# Patient Record
Sex: Female | Born: 1955
Health system: Southern US, Community
[De-identification: ages and names within clinical notes are randomized; demographics above are authoritative.]

## PROBLEM LIST (undated history)

## (undated) DIAGNOSIS — D649 Anemia, unspecified: Secondary | ICD-10-CM

## (undated) DIAGNOSIS — A419 Sepsis, unspecified organism: Secondary | ICD-10-CM

## (undated) DIAGNOSIS — K219 Gastro-esophageal reflux disease without esophagitis: Secondary | ICD-10-CM

## (undated) DIAGNOSIS — M899 Disorder of bone, unspecified: Secondary | ICD-10-CM

## (undated) DIAGNOSIS — M1990 Unspecified osteoarthritis, unspecified site: Secondary | ICD-10-CM

## (undated) DIAGNOSIS — F419 Anxiety disorder, unspecified: Secondary | ICD-10-CM

## (undated) DIAGNOSIS — I10 Essential (primary) hypertension: Secondary | ICD-10-CM

## (undated) DIAGNOSIS — C801 Malignant (primary) neoplasm, unspecified: Secondary | ICD-10-CM

## (undated) DIAGNOSIS — K432 Incisional hernia without obstruction or gangrene: Secondary | ICD-10-CM

## (undated) DIAGNOSIS — G4700 Insomnia, unspecified: Secondary | ICD-10-CM

## (undated) DIAGNOSIS — I700 Atherosclerosis of aorta: Secondary | ICD-10-CM

## (undated) DIAGNOSIS — F329 Major depressive disorder, single episode, unspecified: Secondary | ICD-10-CM

## (undated) DIAGNOSIS — F32A Depression, unspecified: Secondary | ICD-10-CM

## (undated) HISTORY — DX: Anxiety disorder, unspecified: F41.9

## (undated) HISTORY — DX: Insomnia, unspecified: G47.00

## (undated) HISTORY — PX: SPINE SURGERY: SHX786

## (undated) HISTORY — DX: Depression, unspecified: F32.A

## (undated) HISTORY — PX: APPENDECTOMY: SHX54

## (undated) HISTORY — PX: HERNIA REPAIR: SHX51

## (undated) HISTORY — DX: Disorder of bone, unspecified: M89.9

## (undated) HISTORY — PX: ABDOMINAL HYSTERECTOMY: SHX81

## (undated) HISTORY — PX: BACK SURGERY: SHX140

## (undated) HISTORY — PX: TUBAL LIGATION: SHX77

## (undated) HISTORY — PX: LUMBAR FUSION: SHX111

## (undated) HISTORY — PX: PARTIAL HYSTERECTOMY: SHX80

## (undated) HISTORY — PX: MEDIAL PARTIAL KNEE REPLACEMENT: SHX5965

## (undated) HISTORY — PX: TONSILLECTOMY: SUR1361

## (undated) HISTORY — PX: JOINT REPLACEMENT: SHX530

---
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
## 1992-11-12 HISTORY — PX: LUMBAR FUSION: SHX111

## 2004-08-17 ENCOUNTER — Ambulatory Visit: Payer: Self-pay | Admitting: Obstetrics and Gynecology

## 2004-08-22 ENCOUNTER — Ambulatory Visit: Payer: Self-pay | Admitting: Obstetrics and Gynecology

## 2005-02-21 ENCOUNTER — Ambulatory Visit: Payer: Self-pay | Admitting: Obstetrics and Gynecology

## 2005-02-24 ENCOUNTER — Emergency Department: Payer: Self-pay | Admitting: Emergency Medicine

## 2005-10-11 ENCOUNTER — Ambulatory Visit: Payer: Self-pay

## 2007-05-04 ENCOUNTER — Emergency Department: Payer: Self-pay | Admitting: Unknown Physician Specialty

## 2007-05-04 ENCOUNTER — Other Ambulatory Visit: Payer: Self-pay

## 2007-07-22 ENCOUNTER — Ambulatory Visit: Payer: Self-pay | Admitting: Obstetrics and Gynecology

## 2007-08-11 ENCOUNTER — Ambulatory Visit: Payer: Self-pay | Admitting: Obstetrics and Gynecology

## 2007-09-30 ENCOUNTER — Ambulatory Visit: Payer: Self-pay | Admitting: Obstetrics and Gynecology

## 2008-10-19 ENCOUNTER — Ambulatory Visit: Payer: Self-pay | Admitting: Obstetrics and Gynecology

## 2008-12-25 ENCOUNTER — Emergency Department: Payer: Self-pay | Admitting: Emergency Medicine

## 2009-11-12 HISTORY — PX: APPENDECTOMY: SHX54

## 2009-12-14 ENCOUNTER — Ambulatory Visit: Payer: Self-pay | Admitting: Obstetrics and Gynecology

## 2009-12-22 ENCOUNTER — Ambulatory Visit: Payer: Self-pay | Admitting: Unknown Physician Specialty

## 2010-04-10 ENCOUNTER — Emergency Department: Payer: Self-pay | Admitting: Emergency Medicine

## 2010-06-06 ENCOUNTER — Ambulatory Visit: Payer: Self-pay | Admitting: Orthopedic Surgery

## 2010-06-22 ENCOUNTER — Inpatient Hospital Stay: Payer: Self-pay | Admitting: Orthopedic Surgery

## 2010-08-21 ENCOUNTER — Inpatient Hospital Stay: Payer: Self-pay | Admitting: Surgery

## 2010-08-23 LAB — PATHOLOGY REPORT

## 2010-09-13 ENCOUNTER — Ambulatory Visit: Payer: Self-pay | Admitting: Surgery

## 2010-10-10 ENCOUNTER — Ambulatory Visit: Payer: Self-pay | Admitting: Surgery

## 2010-10-11 ENCOUNTER — Other Ambulatory Visit: Payer: Self-pay | Admitting: Surgery

## 2010-11-12 HISTORY — PX: VENTRAL HERNIA REPAIR: SHX424

## 2010-11-12 HISTORY — PX: OTHER SURGICAL HISTORY: SHX169

## 2011-01-19 ENCOUNTER — Ambulatory Visit: Payer: Self-pay | Admitting: Obstetrics and Gynecology

## 2011-01-31 ENCOUNTER — Ambulatory Visit: Payer: Self-pay | Admitting: Internal Medicine

## 2011-02-01 ENCOUNTER — Ambulatory Visit: Payer: Self-pay | Admitting: Internal Medicine

## 2011-02-14 ENCOUNTER — Ambulatory Visit: Payer: Self-pay | Admitting: Surgery

## 2011-04-02 ENCOUNTER — Ambulatory Visit: Payer: Self-pay | Admitting: Urology

## 2011-04-10 ENCOUNTER — Ambulatory Visit: Payer: Self-pay | Admitting: Urology

## 2011-04-12 LAB — PATHOLOGY REPORT

## 2011-06-07 ENCOUNTER — Ambulatory Visit: Payer: Self-pay | Admitting: Surgery

## 2011-06-11 ENCOUNTER — Inpatient Hospital Stay: Payer: Self-pay | Admitting: Surgery

## 2011-06-12 LAB — PATHOLOGY REPORT

## 2011-08-06 ENCOUNTER — Ambulatory Visit: Payer: Self-pay | Admitting: Urology

## 2012-02-04 ENCOUNTER — Ambulatory Visit: Payer: Self-pay | Admitting: Urology

## 2012-02-04 LAB — CREATININE, SERUM
Creatinine: 0.77 mg/dL (ref 0.60–1.30)
EGFR (African American): >60
EGFR (Non-African Amer.): >60

## 2012-02-06 ENCOUNTER — Ambulatory Visit: Payer: Self-pay | Admitting: Obstetrics and Gynecology

## 2012-02-12 ENCOUNTER — Ambulatory Visit: Payer: Self-pay | Admitting: Obstetrics and Gynecology

## 2012-04-20 ENCOUNTER — Emergency Department: Payer: Self-pay | Admitting: Emergency Medicine

## 2012-04-28 ENCOUNTER — Emergency Department: Payer: Self-pay | Admitting: Emergency Medicine

## 2012-08-04 DIAGNOSIS — C649 Malignant neoplasm of unspecified kidney, except renal pelvis: Secondary | ICD-10-CM | POA: Insufficient documentation

## 2012-08-26 ENCOUNTER — Ambulatory Visit: Payer: Self-pay | Admitting: Obstetrics and Gynecology

## 2012-09-05 ENCOUNTER — Ambulatory Visit: Payer: Self-pay | Admitting: Urology

## 2013-03-09 ENCOUNTER — Ambulatory Visit: Payer: Self-pay | Admitting: Obstetrics and Gynecology

## 2013-10-22 ENCOUNTER — Ambulatory Visit: Payer: Self-pay | Admitting: Internal Medicine

## 2013-12-09 ENCOUNTER — Ambulatory Visit: Payer: Self-pay | Admitting: Orthopedic Surgery

## 2014-04-01 ENCOUNTER — Ambulatory Visit: Payer: Self-pay

## 2014-05-11 ENCOUNTER — Emergency Department: Payer: Self-pay | Admitting: Emergency Medicine

## 2014-05-11 LAB — BASIC METABOLIC PANEL
Anion Gap: 1 — ABNORMAL LOW (ref 7–16)
BUN: 15 mg/dL (ref 7–18)
Calcium, Total: 8.8 mg/dL (ref 8.5–10.1)
Chloride: 87 mmol/L — ABNORMAL LOW (ref 98–107)
Co2: 33 mmol/L — ABNORMAL HIGH (ref 21–32)
Creatinine: 1.02 mg/dL (ref 0.60–1.30)
EGFR (African American): >60
EGFR (Non-African Amer.): 60 — ABNORMAL LOW
Glucose: 81 mg/dL (ref 65–99)
Osmolality: 274 (ref 275–301)
Potassium: 3.5 mmol/L (ref 3.5–5.1)
Sodium: 137 mmol/L (ref 136–145)

## 2014-05-11 LAB — CBC
HCT: 41.8 % (ref 35.0–47.0)
HGB: 13.7 g/dL (ref 12.0–16.0)
MCH: 30 pg (ref 26.0–34.0)
MCHC: 32.8 g/dL (ref 32.0–36.0)
MCV: 91 fL (ref 80–100)
Platelet: 199 10*3/uL (ref 150–440)
RBC: 4.57 10*6/uL (ref 3.80–5.20)
RDW: 12 % (ref 11.5–14.5)
WBC: 5.2 10*3/uL (ref 3.6–11.0)

## 2014-05-11 LAB — TROPONIN I
Troponin-I: <0.02 ng/mL
Troponin-I: <0.02 ng/mL

## 2015-05-01 ENCOUNTER — Other Ambulatory Visit: Payer: Self-pay | Admitting: Internal Medicine

## 2015-06-21 ENCOUNTER — Other Ambulatory Visit: Payer: Self-pay

## 2015-06-21 DIAGNOSIS — F411 Generalized anxiety disorder: Secondary | ICD-10-CM | POA: Insufficient documentation

## 2015-06-21 DIAGNOSIS — Z79899 Other long term (current) drug therapy: Secondary | ICD-10-CM | POA: Insufficient documentation

## 2015-06-21 DIAGNOSIS — G5600 Carpal tunnel syndrome, unspecified upper limb: Secondary | ICD-10-CM | POA: Insufficient documentation

## 2015-06-21 DIAGNOSIS — R5383 Other fatigue: Secondary | ICD-10-CM | POA: Insufficient documentation

## 2015-06-21 DIAGNOSIS — F419 Anxiety disorder, unspecified: Secondary | ICD-10-CM | POA: Insufficient documentation

## 2015-06-21 DIAGNOSIS — Z85528 Personal history of other malignant neoplasm of kidney: Secondary | ICD-10-CM | POA: Insufficient documentation

## 2015-06-21 DIAGNOSIS — I10 Essential (primary) hypertension: Secondary | ICD-10-CM | POA: Insufficient documentation

## 2015-06-21 DIAGNOSIS — R0789 Other chest pain: Secondary | ICD-10-CM | POA: Insufficient documentation

## 2015-06-21 DIAGNOSIS — F5101 Primary insomnia: Secondary | ICD-10-CM | POA: Insufficient documentation

## 2015-06-21 DIAGNOSIS — N6019 Diffuse cystic mastopathy of unspecified breast: Secondary | ICD-10-CM | POA: Insufficient documentation

## 2015-06-21 MED ORDER — TRIAMTERENE-HCTZ 37.5-25 MG PO TABS: 1.0000 | ORAL_TABLET | Freq: Every day | ORAL | Status: DC

## 2015-07-06 ENCOUNTER — Other Ambulatory Visit: Payer: Self-pay | Admitting: Obstetrics and Gynecology

## 2015-07-06 DIAGNOSIS — Z1231 Encounter for screening mammogram for malignant neoplasm of breast: Secondary | ICD-10-CM

## 2015-07-12 ENCOUNTER — Ambulatory Visit
Admission: RE | Admit: 2015-07-12 | Discharge: 2015-07-12 | Disposition: A | Discharge status: Home / Self Care | Payer: 59 | Source: Ambulatory Visit | Attending: Obstetrics and Gynecology | Admitting: Obstetrics and Gynecology

## 2015-07-12 ENCOUNTER — Other Ambulatory Visit: Payer: Self-pay | Admitting: Obstetrics and Gynecology

## 2015-07-12 DIAGNOSIS — R928 Other abnormal and inconclusive findings on diagnostic imaging of breast: Secondary | ICD-10-CM

## 2015-07-12 DIAGNOSIS — Z1231 Encounter for screening mammogram for malignant neoplasm of breast: Secondary | ICD-10-CM | POA: Insufficient documentation

## 2015-07-12 DIAGNOSIS — N6489 Other specified disorders of breast: Secondary | ICD-10-CM

## 2015-07-19 ENCOUNTER — Ambulatory Visit
Admission: RE | Admit: 2015-07-19 | Discharge: 2015-07-19 | Disposition: A | Discharge status: Home / Self Care | Payer: 59 | Source: Ambulatory Visit | Attending: Obstetrics and Gynecology | Admitting: Obstetrics and Gynecology

## 2015-07-19 DIAGNOSIS — R928 Other abnormal and inconclusive findings on diagnostic imaging of breast: Secondary | ICD-10-CM

## 2015-07-19 DIAGNOSIS — N6489 Other specified disorders of breast: Secondary | ICD-10-CM

## 2015-08-16 ENCOUNTER — Encounter: Payer: Self-pay | Admitting: Internal Medicine

## 2015-08-16 ENCOUNTER — Other Ambulatory Visit: Payer: Self-pay

## 2015-08-16 MED ORDER — ZOLPIDEM TARTRATE 10 MG PO TABS: 10.0000 mg | ORAL_TABLET | Freq: Every day | ORAL | Status: DC

## 2015-09-14 ENCOUNTER — Encounter: Payer: Self-pay | Admitting: Internal Medicine

## 2015-09-14 ENCOUNTER — Ambulatory Visit (INDEPENDENT_AMBULATORY_CARE_PROVIDER_SITE_OTHER): Payer: 59 | Admitting: Internal Medicine

## 2015-09-14 VITALS — BP 110/60 | HR 72 | Temp 98.5°F | Ht 66.0 in | Wt 210.0 lb

## 2015-09-14 DIAGNOSIS — J40 Bronchitis, not specified as acute or chronic: Secondary | ICD-10-CM | POA: Diagnosis not present

## 2015-09-14 MED ORDER — AMOXICILLIN-POT CLAVULANATE 875-125 MG PO TABS: 1.0000 | ORAL_TABLET | Freq: Two times a day (BID) | ORAL | Status: DC

## 2015-09-14 MED ORDER — FLUTICASONE PROPIONATE 50 MCG/ACT NA SUSP: 2.0000 | Freq: Every day | NASAL | Status: DC

## 2015-09-14 NOTE — Progress Notes (Signed)
Date:  09/14/2015   Name:  Tracy Chase   DOB:  1956/08/14   MRN:  [id number]   Chief Complaint: Cough  Patient complains of a cough as well as anterior chest discomfort and discomfort in her right lateral posterior ribs. The cough is productive of yellow thick brown mucus mostly in the morning. She's had some sinus congestion as well with some earache but no sore throat. She hasn't had any fever or chills. No wheezing. No history of smoking.   Review of Systems  Constitutional: Positive for fatigue. Negative for fever, chills and diaphoresis.  HENT: Positive for ear pain and sinus pressure. Negative for hearing loss, postnasal drip, rhinorrhea, sore throat, trouble swallowing and voice change.   Respiratory: Positive for cough and chest tightness. Negative for shortness of breath and wheezing.   Cardiovascular: Negative for chest pain, palpitations and leg swelling.  Gastrointestinal: Negative for abdominal pain.  Neurological: Positive for headaches. Negative for light-headedness.    Patient Active Problem List   Diagnosis Date Noted  . Anxiety 06/21/2015  . Bloodgood disease 06/21/2015  . Carcinoma of kidney ([place]) 06/21/2015  . Carpal tunnel syndrome 06/21/2015  . Atypical chest pain 06/21/2015  . Polypharmacy 06/21/2015  . Essential (primary) hypertension 06/21/2015  . Fatigue 06/21/2015  . Idiopathic insomnia 06/21/2015  . Malignant neoplasm of kidney ([place]) 08/04/2012    Prior to Admission medications   Medication Sig Start Date End Date Taking? Authorizing Provider  PARoxetine (PAXIL) 30 MG tablet Take 1 tablet by mouth  daily 05/01/15  Yes Glean Hess, MD  triamterene-hydrochlorothiazide [facility]) 37.5-25 MG per tablet Take 1 tablet by mouth daily at 2 PM daily at 2 PM. 06/21/15  Yes Glean Hess, MD  zolpidem (AMBIEN) 10 MG tablet Take 1 tablet (10 mg total) by mouth daily at 2 PM daily at 2 PM. 08/16/15  Yes Glean Hess, MD    Allergies  Allergen Reactions   . Hydrocodone Itching    Past Surgical History  Procedure Laterality Date  . Ventral hernia repair  2012  . Lumbar fusion    . Partial hysterectomy    . Cryoablation for renal cell carcinoma  2012  . Medial partial knee replacement Left   . Tonsillectomy    . Appendectomy      Social History  Substance Use Topics  . Smoking status: Never Smoker   . Smokeless tobacco: None  . Alcohol Use: No     Medication list has been reviewed and updated.   Physical Exam  Constitutional: She appears well-developed and well-nourished.  HENT:  Right Ear: Ear canal normal. Tympanic membrane is retracted.  Left Ear: Ear canal normal. Tympanic membrane is retracted.  Nose: Right sinus exhibits no maxillary sinus tenderness and no frontal sinus tenderness. Left sinus exhibits no maxillary sinus tenderness and no frontal sinus tenderness.  Mouth/Throat: Uvula is midline. Posterior oropharyngeal erythema present.  Neck: Normal range of motion. Neck supple.  Cardiovascular: Normal rate, regular rhythm and normal heart sounds.   Pulmonary/Chest: Effort normal. She has rhonchi in the left lower field.  Lymphadenopathy:    She has no cervical adenopathy.  Psychiatric: She has a normal mood and affect.    BP 110/60 mmHg  Pulse 72  Temp(Src) 98.5 F (36.9 C)  Ht 5\' 6"  (1.676 m)  Wt 210 lb (95.255 kg)  BMI 33.91 kg/m2  SpO2 100%  Assessment and Plan: 1. Bronchitis Activity as tolerated - amoxicillin-clavulanate (AUGMENTIN) 875-125 MG tablet; Take 1  tablet by mouth 2 (two) times daily.  Dispense: 20 tablet; Refill: 0 - fluticasone (FLONASE) 50 MCG/ACT nasal spray; Place 2 sprays into both nostrils daily.  Dispense: 16 g; Refill: [place], MD [facility]  09/14/2015

## 2015-10-18 ENCOUNTER — Encounter: Payer: Self-pay | Admitting: Internal Medicine

## 2015-10-18 ENCOUNTER — Ambulatory Visit
Admission: RE | Admit: 2015-10-18 | Discharge: 2015-10-18 | Disposition: A | Discharge status: Home / Self Care | Payer: 59 | Source: Ambulatory Visit | Attending: Internal Medicine | Admitting: Internal Medicine

## 2015-10-18 ENCOUNTER — Ambulatory Visit (INDEPENDENT_AMBULATORY_CARE_PROVIDER_SITE_OTHER): Payer: 59 | Admitting: Internal Medicine

## 2015-10-18 VITALS — BP 110/80 | HR 68 | Temp 97.9°F | Ht 66.0 in | Wt 213.2 lb

## 2015-10-18 DIAGNOSIS — R059 Cough, unspecified: Secondary | ICD-10-CM

## 2015-10-18 DIAGNOSIS — J069 Acute upper respiratory infection, unspecified: Secondary | ICD-10-CM | POA: Diagnosis not present

## 2015-10-18 DIAGNOSIS — R05 Cough: Secondary | ICD-10-CM | POA: Diagnosis not present

## 2015-10-18 NOTE — Progress Notes (Signed)
Date:  10/18/2015   Name:  Tracy Chase   DOB:  [birth year]   MRN:  [id number]   Chief Complaint: Sinusitis Patient was treated 1 month ago for bronchitis. She received 10 days of Augmentin. She reports that the thick green sputum has resolved and now is only thin and clear. However her cough continues. She is concerned because she has some upper airway discomfort burning overall sensation and discomfort between her shoulder blades. She has a remote history of smoking socially but also has significant secondhand smoke history. Her main complaint today is runny nose and sneezing with postnasal drip. She has clear sinus discharge without ear pain headache or dizziness. Claritin made her hyperactive and Benadryl is too sedating. Flonase nasal spray caused a headache.   Review of Systems  Constitutional: Positive for fatigue. Negative for fever, chills and diaphoresis.  HENT: Positive for postnasal drip and sneezing. Negative for ear pain, hearing loss, nosebleeds and sinus pressure.   Eyes: Negative for visual disturbance.  Respiratory: Positive for cough and chest tightness. Negative for shortness of breath and wheezing.   Cardiovascular: Positive for chest pain. Negative for palpitations.  Gastrointestinal: Negative for diarrhea.  Neurological: Negative for dizziness and headaches.    Patient Active Problem List   Diagnosis Date Noted  . Anxiety 06/21/2015  . Bloodgood disease 06/21/2015  . Carcinoma of kidney ([place]) 06/21/2015  . Carpal tunnel syndrome 06/21/2015  . Atypical chest pain 06/21/2015  . Polypharmacy 06/21/2015  . Essential (primary) hypertension 06/21/2015  . Fatigue 06/21/2015  . Idiopathic insomnia 06/21/2015  . Malignant neoplasm of kidney ([place]) 08/04/2012    Prior to Admission medications   Medication Sig Start Date End Date Taking? Authorizing Provider  PARoxetine (PAXIL) 30 MG tablet Take 1 tablet by mouth  daily 05/01/15  Yes Glean Hess, MD   triamterene-hydrochlorothiazide [facility]) 37.5-25 MG per tablet Take 1 tablet by mouth daily at 2 PM daily at 2 PM. 06/21/15  Yes Glean Hess, MD  zolpidem (AMBIEN) 10 MG tablet Take 1 tablet (10 mg total) by mouth daily at 2 PM daily at 2 PM. 08/16/15  Yes Glean Hess, MD  fluticasone [facility]) 50 MCG/ACT nasal spray Place 2 sprays into both nostrils daily. Patient not taking: Reported on 10/18/2015 09/14/15   Glean Hess, MD    Allergies  Allergen Reactions  . Hydrocodone Itching    Past Surgical History  Procedure Laterality Date  . Ventral hernia repair  2012  . Lumbar fusion    . Partial hysterectomy    . Cryoablation for renal cell carcinoma  2012  . Medial partial knee replacement Left   . Tonsillectomy    . Appendectomy      Social History  Substance Use Topics  . Smoking status: Never Smoker   . Smokeless tobacco: None  . Alcohol Use: No     Medication list has been reviewed and updated.   Physical Exam  Constitutional: She is oriented to person, place, and time. She appears well-developed. No distress.  HENT:  Head: Normocephalic and atraumatic.  Right Ear: Tympanic membrane and ear canal normal.  Left Ear: Tympanic membrane and ear canal normal.  Nose: Nose normal. Right sinus exhibits no maxillary sinus tenderness and no frontal sinus tenderness. Left sinus exhibits no maxillary sinus tenderness and no frontal sinus tenderness.  Mouth/Throat: No posterior oropharyngeal edema or posterior oropharyngeal erythema.  Eyes: Conjunctivae are normal. Right eye exhibits no discharge. Left eye exhibits no discharge. No  scleral icterus.  Neck: Normal range of motion. Neck supple. No thyromegaly present.  Cardiovascular: Normal rate, regular rhythm and normal heart sounds.   Pulmonary/Chest: Effort normal. No respiratory distress. She has no wheezes. She has rhonchi (coarse breath sounds improved at [facility]).  Musculoskeletal: Normal range of motion.   Neurological: She is alert and oriented to person, place, and time.  Skin: Skin is warm and dry. No rash noted.  Psychiatric: She has a normal mood and affect. Her behavior is normal. Thought content normal.  Nursing note and vitals reviewed.   BP 110/80 mmHg  Pulse 68  Temp(Src) 97.9 F (36.6 C)  Ht 5\' 6"  (1.676 m)  Wt 213 lb 3.2 oz (96.707 kg)  BMI 34.43 kg/m2  SpO2 98%  Assessment and Plan: 1. Cough We will obtain chest x-ray to rule out pulmonary process in view of persistent cough and chest discomfort May need a second course of antibiotics such as Avelox - DG Chest 2 View; Future  2. Viral URI Postnasal drip contributing to cough as well Recommend Zyrtec 10 mg daily samples given   Halina Maidens, MD [facility]  10/18/2015

## 2015-10-28 ENCOUNTER — Other Ambulatory Visit: Payer: Self-pay | Admitting: Internal Medicine

## 2015-11-22 ENCOUNTER — Other Ambulatory Visit: Payer: Self-pay | Admitting: Internal Medicine

## 2015-11-23 ENCOUNTER — Other Ambulatory Visit: Payer: Self-pay

## 2015-11-23 MED ORDER — ZOLPIDEM TARTRATE 10 MG PO TABS: 10.0000 mg | ORAL_TABLET | Freq: Every day | ORAL | Status: DC

## 2015-11-23 NOTE — Telephone Encounter (Signed)
Received fax from pharmacy, requesting refill.

## 2015-12-05 NOTE — Telephone Encounter (Signed)
pts coming in on 4/12 for cpe

## 2015-12-23 ENCOUNTER — Ambulatory Visit (INDEPENDENT_AMBULATORY_CARE_PROVIDER_SITE_OTHER): Payer: 59 | Admitting: Internal Medicine

## 2015-12-23 ENCOUNTER — Encounter: Payer: Self-pay | Admitting: Internal Medicine

## 2015-12-23 VITALS — BP 120/70 | HR 64 | Ht 66.0 in | Wt 219.8 lb

## 2015-12-23 DIAGNOSIS — R1013 Epigastric pain: Secondary | ICD-10-CM

## 2015-12-23 NOTE — Progress Notes (Signed)
Date:  12/23/2015   Name:  Tracy Chase   DOB:  February 19, 1956   MRN:  [id number]   Chief Complaint: Abdominal Pain  Several weeks ago the patient was stretching and reaching her arms over her head which she felt a sharp pain in the epigastric region. That lasted only a few minutes and since and the area has been very tender. She denies any change with movement reaching or twisting. Eating may cause her to have some fullness or heaviness in the upper stomach. She denies vomiting or nausea.She's been constipated but has had no diarrhea or bleeding. She had a ventral hernia repair several years ago and is concerned that that has extended.    Review of Systems  Constitutional: Negative for fever, chills and fatigue.  Respiratory: Negative for chest tightness and shortness of breath.   Cardiovascular: Negative for chest pain.  Gastrointestinal: Positive for abdominal pain and constipation. Negative for nausea, vomiting, diarrhea and blood in stool.  Genitourinary: Negative for dysuria, frequency and hematuria.  Musculoskeletal: Negative for myalgias and arthralgias.    Patient Active Problem List   Diagnosis Date Noted  . Anxiety 06/21/2015  . Bloodgood disease 06/21/2015  . Carcinoma of kidney ([place]) 06/21/2015  . Carpal tunnel syndrome 06/21/2015  . Atypical chest pain 06/21/2015  . Polypharmacy 06/21/2015  . Essential (primary) hypertension 06/21/2015  . Fatigue 06/21/2015  . Idiopathic insomnia 06/21/2015  . Malignant neoplasm of kidney ([place]) 08/04/2012    Prior to Admission medications   Medication Sig Start Date End Date Taking? Authorizing Provider  PARoxetine (PAXIL) 30 MG tablet Take 1 tablet by mouth  daily 10/28/15  Yes Glean Hess, MD  tiZANidine (ZANAFLEX) 4 MG capsule Take 1 capsule by mouth as needed. 12/14/15  Yes Historical Provider, MD  triamterene-hydrochlorothiazide (MAXZIDE-25) 37.5-25 MG tablet TAKE 1 TABLET BY MOUTH  DAILY AT 2 PM 11/22/15  Yes Glean Hess,  MD  zolpidem (AMBIEN) 10 MG tablet Take 1 tablet (10 mg total) by mouth daily at 2 PM. 11/23/15  Yes Glean Hess, MD    Allergies  Allergen Reactions  . Hydrocodone Itching    Past Surgical History  Procedure Laterality Date  . Ventral hernia repair  2012    Dr. Genevive Bi  . Lumbar fusion    . Partial hysterectomy    . Cryoablation for renal cell carcinoma  2012  . Medial partial knee replacement Left   . Tonsillectomy    . Appendectomy      Social History  Substance Use Topics  . Smoking status: Never Smoker   . Smokeless tobacco: None  . Alcohol Use: 0.0 oz/week    0 Standard drinks or equivalent per week     Comment: socially     Medication list has been reviewed and updated.   Physical Exam  Constitutional: She appears well-developed and well-nourished.  Neck: Normal range of motion. No thyromegaly present.  Cardiovascular: Normal rate, regular rhythm and normal heart sounds.   Pulmonary/Chest: Effort normal and breath sounds normal.  Abdominal: Soft. Normal appearance. Bowel sounds are decreased. There is no hepatosplenomegaly. There is tenderness in the right upper quadrant and epigastric area. There is no rebound, no guarding and no CVA tenderness. No hernia (no definite hernia palpated.).  Nursing note and vitals reviewed.   BP 120/70 mmHg  Pulse 64  Ht 5\' 6"  (1.676 m)  Wt 219 lb 12.8 oz (99.701 kg)  BMI 35.49 kg/m2  Assessment and Plan: 1. Epigastric  abdominal pain Concern for possible extension of the previous ventral hernia Could also be gallbladder or pancreatic in etiology Recommend low-fat low protein small meals - US Abdomen Complete; Future - Ambulatory referral to [place], MD [facility]  12/23/2015

## 2015-12-27 ENCOUNTER — Ambulatory Visit: Payer: 59

## 2015-12-30 ENCOUNTER — Ambulatory Visit
Admission: RE | Admit: 2015-12-30 | Discharge: 2015-12-30 | Disposition: A | Discharge status: Home / Self Care | Payer: 59 | Source: Ambulatory Visit | Attending: Internal Medicine | Admitting: Internal Medicine

## 2015-12-30 ENCOUNTER — Telehealth: Payer: Self-pay

## 2015-12-30 DIAGNOSIS — R932 Abnormal findings on diagnostic imaging of liver and biliary tract: Secondary | ICD-10-CM | POA: Diagnosis not present

## 2015-12-30 DIAGNOSIS — G8929 Other chronic pain: Secondary | ICD-10-CM | POA: Diagnosis not present

## 2015-12-30 DIAGNOSIS — R1013 Epigastric pain: Secondary | ICD-10-CM

## 2015-12-30 NOTE — Telephone Encounter (Signed)
Spoke with patient. Patient advised of all results and verbalized understanding. Will call back with any future questions or concerns. Patient states that she has an appointment in march.

## 2015-12-30 NOTE — Telephone Encounter (Signed)
-----   Message from Glean Hess, MD sent at 12/30/2015  1:43 PM EST ----- Normal [place] except for possible polyp in the gall bladder.  Recommend consult with General Surgery as planned to discuss treatment or follow up.

## 2015-12-30 NOTE — Telephone Encounter (Signed)
Left message for patient to call back  

## 2016-01-04 ENCOUNTER — Ambulatory Visit: Payer: 59 | Admitting: Surgery

## 2016-01-18 ENCOUNTER — Other Ambulatory Visit: Payer: Self-pay | Admitting: Internal Medicine

## 2016-01-18 ENCOUNTER — Encounter: Payer: Self-pay | Admitting: Internal Medicine

## 2016-01-18 ENCOUNTER — Ambulatory Visit (INDEPENDENT_AMBULATORY_CARE_PROVIDER_SITE_OTHER): Payer: 59 | Admitting: Internal Medicine

## 2016-01-18 VITALS — BP 122/68 | HR 60 | Ht 66.0 in | Wt 214.8 lb

## 2016-01-18 DIAGNOSIS — F5101 Primary insomnia: Secondary | ICD-10-CM

## 2016-01-18 DIAGNOSIS — I10 Essential (primary) hypertension: Secondary | ICD-10-CM

## 2016-01-18 DIAGNOSIS — F419 Anxiety disorder, unspecified: Secondary | ICD-10-CM | POA: Diagnosis not present

## 2016-01-18 DIAGNOSIS — E785 Hyperlipidemia, unspecified: Secondary | ICD-10-CM | POA: Diagnosis not present

## 2016-01-18 MED ORDER — PAROXETINE HCL 30 MG PO TABS: 30.0000 mg | ORAL_TABLET | Freq: Every day | ORAL | Status: DC

## 2016-01-18 NOTE — Progress Notes (Signed)
Date:  01/18/2016   Name:  Tracy Chase   DOB:  11/25/1955   MRN:  [id number]   Chief Complaint: Follow-up; Hypertension; and Depression Hypertension This is a chronic problem. The current episode started more than 1 year ago. The problem is unchanged. The problem is controlled. Pertinent negatives include no chest pain, headaches, palpitations or shortness of breath. Past treatments include diuretics. The current treatment provides significant improvement.  Depression        This is a chronic problem.  The current episode started more than 1 year ago.   The onset quality is undetermined.   The problem has been resolved since onset.  Associated symptoms include insomnia.  Associated symptoms include no fatigue and no headaches.  Past treatments include SSRIs - Selective serotonin reuptake inhibitors.  Compliance with treatment is good.  Previous treatment provided significant relief. Insomnia Primary symptoms: sleep disturbance.  The problem occurs nightly. The symptoms are relieved by medication. PMH includes: depression.      Review of Systems  Constitutional: Negative for fever, chills and fatigue.  HENT: Negative for tinnitus and trouble swallowing.   Eyes: Negative for visual disturbance.  Respiratory: Negative for cough, chest tightness and shortness of breath.   Cardiovascular: Negative for chest pain, palpitations and leg swelling.  Gastrointestinal: Negative for abdominal pain and blood in stool.  Neurological: Negative for dizziness, light-headedness and headaches.  Hematological: Negative for adenopathy.  Psychiatric/Behavioral: Positive for depression and sleep disturbance. Negative for self-injury and dysphoric mood. The patient has insomnia. The patient is not nervous/anxious.     Patient Active Problem List   Diagnosis Date Noted  . Anxiety 06/21/2015  . Bloodgood disease 06/21/2015  . Carcinoma of kidney ([place]) 06/21/2015  . Carpal tunnel syndrome 06/21/2015  .  Atypical chest pain 06/21/2015  . Polypharmacy 06/21/2015  . Essential (primary) hypertension 06/21/2015  . Fatigue 06/21/2015  . Idiopathic insomnia 06/21/2015  . Malignant neoplasm of kidney ([place]) 08/04/2012    Prior to Admission medications   Medication Sig Start Date End Date Taking? Authorizing Provider  diclofenac (VOLTAREN) 75 MG EC tablet Take 1 tablet by mouth 2 (two) times daily. 01/10/16  Yes Historical Provider, MD  PARoxetine (PAXIL) 30 MG tablet Take 1 tablet by mouth  daily 10/28/15  Yes Glean Hess, MD  tiZANidine (ZANAFLEX) 4 MG capsule Take 1 capsule by mouth as needed. 12/14/15  Yes Historical Provider, MD  triamterene-hydrochlorothiazide (MAXZIDE-25) 37.5-25 MG tablet TAKE 1 TABLET BY MOUTH  DAILY AT 2 PM 11/22/15  Yes Glean Hess, MD  zolpidem (AMBIEN) 10 MG tablet Take 1 tablet (10 mg total) by mouth daily at 2 PM. 11/23/15  Yes Glean Hess, MD    Allergies  Allergen Reactions  . Hydrocodone Itching    Past Surgical History  Procedure Laterality Date  . Ventral hernia repair  2012    Dr. Genevive Bi  . Lumbar fusion    . Partial hysterectomy    . Cryoablation for renal cell carcinoma  2012  . Medial partial knee replacement Left   . Tonsillectomy    . Appendectomy      Social History  Substance Use Topics  . Smoking status: Never Smoker   . Smokeless tobacco: None  . Alcohol Use: 0.0 oz/week    0 Standard drinks or equivalent per week     Comment: socially     Medication list has been reviewed and updated.   Physical Exam  Constitutional: She is oriented  to person, place, and time. She appears well-developed. No distress.  HENT:  Head: Normocephalic and atraumatic.  Neck: Normal range of motion.  Cardiovascular: Normal rate, regular rhythm and normal heart sounds.   Pulmonary/Chest: Effort normal and breath sounds normal. No respiratory distress.  Musculoskeletal: Normal range of motion. She exhibits no edema or tenderness.    Lymphadenopathy:    She has no cervical adenopathy.  Neurological: She is alert and oriented to person, place, and time.  Skin: Skin is warm and dry. No rash noted.  Psychiatric: She has a normal mood and affect. Her behavior is normal. Thought content normal.  Nursing note and vitals reviewed.   BP 122/68 mmHg  Pulse 60  Ht 5\' 6"  (1.676 m)  Wt 214 lb 12.8 oz (97.433 kg)  BMI 34.69 kg/m2  Assessment and Plan: 1. Anxiety Continue paxil - call for taper instructions if desired - PARoxetine (PAXIL) 30 MG tablet; Take 1 tablet (30 mg total) by mouth daily.  Dispense: 90 tablet; Refill: 0  2. Essential (primary) hypertension controlled - CBC with Differential/Platelet - Comprehensive metabolic panel - TSH  3. Hyperlipidemia Has not required medication in the past - Lipid panel  4. Idiopathic insomnia Doing well on Ambien   Halina Maidens, MD [facility]  01/18/2016

## 2016-01-19 ENCOUNTER — Telehealth: Payer: Self-pay

## 2016-01-19 LAB — LIPID PANEL
Chol/HDL Ratio: 2.2 ratio units (ref 0.0–4.4)
Cholesterol, Total: 233 mg/dL — ABNORMAL HIGH (ref 100–199)
HDL: 107 mg/dL (ref >39)
LDL Calculated: 108 mg/dL — ABNORMAL HIGH (ref 0–99)
Triglycerides: 92 mg/dL (ref 0–149)
VLDL Cholesterol Cal: 18 mg/dL (ref 5–40)

## 2016-01-19 LAB — CBC WITH DIFFERENTIAL/PLATELET
Basophils Absolute: 0 10*3/uL (ref 0.0–0.2)
Basos: 0 %
EOS (ABSOLUTE): 0.1 10*3/uL (ref 0.0–0.4)
Eos: 1 %
Hematocrit: 42.1 % (ref 34.0–46.6)
Hemoglobin: 14.3 g/dL (ref 11.1–15.9)
Immature Grans (Abs): 0 10*3/uL (ref 0.0–0.1)
Immature Granulocytes: 0 %
Lymphocytes Absolute: 2.9 10*3/uL (ref 0.7–3.1)
Lymphs: 40 %
MCH: 30.6 pg (ref 26.6–33.0)
MCHC: 34 g/dL (ref 31.5–35.7)
MCV: 90 fL (ref 79–97)
Monocytes Absolute: 0.5 10*3/uL (ref 0.1–0.9)
Monocytes: 6 %
Neutrophils Absolute: 3.8 10*3/uL (ref 1.4–7.0)
Neutrophils: 53 %
Platelets: 262 10*3/uL (ref 150–379)
RBC: 4.68 x10E6/uL (ref 3.77–5.28)
RDW: 13.2 % (ref 12.3–15.4)
WBC: 7.3 10*3/uL (ref 3.4–10.8)

## 2016-01-19 LAB — COMPREHENSIVE METABOLIC PANEL
ALT: 12 IU/L (ref 0–32)
AST: 13 IU/L (ref 0–40)
Albumin/Globulin Ratio: 2.1 (ref 1.1–2.5)
Albumin: 4.5 g/dL (ref 3.6–4.8)
Alkaline Phosphatase: 73 IU/L (ref 39–117)
BUN/Creatinine Ratio: 17 (ref 11–26)
BUN: 15 mg/dL (ref 8–27)
Bilirubin Total: 0.4 mg/dL (ref 0.0–1.2)
CO2: 26 mmol/L (ref 18–29)
Calcium: 9.4 mg/dL (ref 8.7–10.3)
Chloride: 96 mmol/L (ref 96–106)
Creatinine, Ser: 0.86 mg/dL (ref 0.57–1.00)
GFR calc Af Amer: 84 mL/min/{1.73_m2} (ref >59)
GFR calc non Af Amer: 73 mL/min/{1.73_m2} (ref >59)
Globulin, Total: 2.1 g/dL (ref 1.5–4.5)
Glucose: 92 mg/dL (ref 65–99)
Potassium: 4.2 mmol/L (ref 3.5–5.2)
Sodium: 139 mmol/L (ref 134–144)
Total Protein: 6.6 g/dL (ref 6.0–8.5)

## 2016-01-19 LAB — TSH: TSH: 2.06 u[IU]/mL (ref 0.450–4.500)

## 2016-01-19 NOTE — Telephone Encounter (Signed)
-----   Message from Glean Hess, MD sent at 01/19/2016  7:55 AM EST ----- Labs are all normal.

## 2016-01-19 NOTE — Telephone Encounter (Signed)
Tried calling patient with no answer. Will try again. 

## 2016-01-19 NOTE — Telephone Encounter (Signed)
Spoke with patient. Patient advised of all results and verbalized understanding. Will call back with any future questions or concerns. [facility]  

## 2016-01-25 ENCOUNTER — Ambulatory Visit: Payer: Self-pay | Admitting: Surgery

## 2016-01-30 ENCOUNTER — Ambulatory Visit: Payer: 59 | Admitting: Gastroenterology

## 2016-02-22 ENCOUNTER — Encounter: Payer: 59 | Admitting: Internal Medicine

## 2016-03-02 ENCOUNTER — Other Ambulatory Visit: Payer: Self-pay

## 2016-03-02 ENCOUNTER — Other Ambulatory Visit: Payer: Self-pay | Admitting: Internal Medicine

## 2016-03-02 DIAGNOSIS — F419 Anxiety disorder, unspecified: Secondary | ICD-10-CM

## 2016-03-02 MED ORDER — DICLOFENAC SODIUM 75 MG PO TBEC: 75.0000 mg | DELAYED_RELEASE_TABLET | Freq: Two times a day (BID) | ORAL | Status: DC

## 2016-03-02 MED ORDER — PAROXETINE HCL 30 MG PO TABS: 30.0000 mg | ORAL_TABLET | Freq: Every day | ORAL | Status: DC

## 2016-03-02 MED ORDER — TRIAMTERENE-HCTZ 37.5-25 MG PO TABS: 1.0000 | ORAL_TABLET | Freq: Every day | ORAL | Status: DC

## 2016-03-02 MED ORDER — ZOLPIDEM TARTRATE 10 MG PO TABS: 10.0000 mg | ORAL_TABLET | Freq: Every day | ORAL | Status: DC

## 2016-03-02 MED ORDER — TIZANIDINE HCL 4 MG PO CAPS: 4.0000 mg | ORAL_CAPSULE | Freq: Three times a day (TID) | ORAL | Status: DC | PRN

## 2016-03-02 NOTE — Telephone Encounter (Signed)
Patient called in today. She states that she no longer has insurance. She states that you recently sent Rx to [facility]. She states that they cannot fill these meds since she does not have insurance. She also stated that they will not transfer the meds to [facility] on [address]. She asked if you could send in her meds to [place]. [employer]

## 2016-04-02 ENCOUNTER — Other Ambulatory Visit: Payer: Self-pay | Admitting: Internal Medicine

## 2016-04-02 ENCOUNTER — Telehealth: Payer: Self-pay

## 2016-04-02 MED ORDER — ZOLPIDEM TARTRATE 10 MG PO TABS: 10.0000 mg | ORAL_TABLET | Freq: Every day | ORAL | Status: DC

## 2016-04-02 NOTE — Telephone Encounter (Signed)
Shredded Ambien as it was filled. [facility]

## 2016-04-02 NOTE — Telephone Encounter (Signed)
Patient left message that she has been waiting on refills for Ambian and Diazepam and has asked three times.

## 2016-06-26 ENCOUNTER — Other Ambulatory Visit: Payer: Self-pay | Admitting: Internal Medicine

## 2016-07-05 ENCOUNTER — Other Ambulatory Visit: Payer: Self-pay | Admitting: Internal Medicine

## 2016-07-05 DIAGNOSIS — Z1231 Encounter for screening mammogram for malignant neoplasm of breast: Secondary | ICD-10-CM

## 2016-08-14 ENCOUNTER — Other Ambulatory Visit: Payer: Self-pay | Admitting: Internal Medicine

## 2016-08-14 DIAGNOSIS — F419 Anxiety disorder, unspecified: Secondary | ICD-10-CM

## 2016-08-28 ENCOUNTER — Other Ambulatory Visit: Payer: Self-pay | Admitting: Orthopedic Surgery

## 2016-08-28 DIAGNOSIS — M5030 Other cervical disc degeneration, unspecified cervical region: Secondary | ICD-10-CM

## 2016-09-13 ENCOUNTER — Ambulatory Visit: Payer: 59

## 2016-10-02 ENCOUNTER — Ambulatory Visit (INDEPENDENT_AMBULATORY_CARE_PROVIDER_SITE_OTHER): Payer: Self-pay | Admitting: Internal Medicine

## 2016-10-02 DIAGNOSIS — Z23 Encounter for immunization: Secondary | ICD-10-CM

## 2016-10-02 NOTE — Progress Notes (Signed)
Nurse visit only

## 2016-10-25 ENCOUNTER — Other Ambulatory Visit: Payer: Self-pay | Admitting: Internal Medicine

## 2016-11-12 ENCOUNTER — Other Ambulatory Visit: Payer: Self-pay | Admitting: Internal Medicine

## 2016-11-12 DIAGNOSIS — F419 Anxiety disorder, unspecified: Secondary | ICD-10-CM

## 2016-11-20 NOTE — Telephone Encounter (Signed)
pts coming in on 3/6 for follow up with labs

## 2017-01-15 ENCOUNTER — Ambulatory Visit: Payer: Self-pay | Admitting: Internal Medicine

## 2017-01-22 ENCOUNTER — Ambulatory Visit: Payer: Self-pay | Admitting: Internal Medicine

## 2017-02-05 ENCOUNTER — Encounter: Payer: Self-pay | Admitting: Internal Medicine

## 2017-02-05 ENCOUNTER — Other Ambulatory Visit
Admission: RE | Admit: 2017-02-05 | Discharge: 2017-02-05 | Disposition: A | Discharge status: Home / Self Care | Payer: 59 | Source: Ambulatory Visit | Attending: Internal Medicine | Admitting: Internal Medicine

## 2017-02-05 ENCOUNTER — Ambulatory Visit (INDEPENDENT_AMBULATORY_CARE_PROVIDER_SITE_OTHER): Payer: Self-pay | Admitting: Internal Medicine

## 2017-02-05 VITALS — BP 102/68 | HR 58 | Ht 66.0 in | Wt 195.0 lb

## 2017-02-05 DIAGNOSIS — I10 Essential (primary) hypertension: Secondary | ICD-10-CM | POA: Insufficient documentation

## 2017-02-05 DIAGNOSIS — F5101 Primary insomnia: Secondary | ICD-10-CM

## 2017-02-05 DIAGNOSIS — Z1231 Encounter for screening mammogram for malignant neoplasm of breast: Secondary | ICD-10-CM

## 2017-02-05 DIAGNOSIS — C641 Malignant neoplasm of right kidney, except renal pelvis: Secondary | ICD-10-CM

## 2017-02-05 DIAGNOSIS — Z1239 Encounter for other screening for malignant neoplasm of breast: Secondary | ICD-10-CM

## 2017-02-05 DIAGNOSIS — F419 Anxiety disorder, unspecified: Secondary | ICD-10-CM

## 2017-02-05 LAB — COMPREHENSIVE METABOLIC PANEL
ALT: 17 U/L (ref 14–54)
AST: 21 U/L (ref 15–41)
Albumin: 4.2 g/dL (ref 3.5–5.0)
Alkaline Phosphatase: 65 U/L (ref 38–126)
Anion gap: 7 (ref 5–15)
BUN: 11 mg/dL (ref 6–20)
CO2: 30 mmol/L (ref 22–32)
Calcium: 9 mg/dL (ref 8.9–10.3)
Chloride: 99 mmol/L — ABNORMAL LOW (ref 101–111)
Creatinine, Ser: 0.77 mg/dL (ref 0.44–1.00)
GFR calc Af Amer: >60 mL/min (ref >60)
GFR calc non Af Amer: >60 mL/min (ref >60)
Glucose, Bld: 106 mg/dL — ABNORMAL HIGH (ref 65–99)
Potassium: 3.9 mmol/L (ref 3.5–5.1)
Sodium: 136 mmol/L (ref 135–145)
Total Bilirubin: 0.7 mg/dL (ref 0.3–1.2)
Total Protein: 6.8 g/dL (ref 6.5–8.1)

## 2017-02-05 LAB — CBC WITH DIFFERENTIAL/PLATELET
Basophils Absolute: 0 10*3/uL (ref 0–0.1)
Basophils Relative: 1 %
Eosinophils Absolute: 0.1 10*3/uL (ref 0–0.7)
Eosinophils Relative: 3 %
HCT: 42.4 % (ref 35.0–47.0)
Hemoglobin: 14.1 g/dL (ref 12.0–16.0)
Lymphocytes Relative: 46 %
Lymphs Abs: 2.5 10*3/uL (ref 1.0–3.6)
MCH: 30 pg (ref 26.0–34.0)
MCHC: 33.2 g/dL (ref 32.0–36.0)
MCV: 90.4 fL (ref 80.0–100.0)
Monocytes Absolute: 0.4 10*3/uL (ref 0.2–0.9)
Monocytes Relative: 8 %
Neutro Abs: 2.2 10*3/uL (ref 1.4–6.5)
Neutrophils Relative %: 42 %
Platelets: 209 10*3/uL (ref 150–440)
RBC: 4.69 MIL/uL (ref 3.80–5.20)
RDW: 12.4 % (ref 11.5–14.5)
WBC: 5.3 10*3/uL (ref 3.6–11.0)

## 2017-02-05 MED ORDER — ZOLPIDEM TARTRATE 10 MG PO TABS: 10.0000 mg | ORAL_TABLET | Freq: Every day | ORAL | 5 refills | Status: DC

## 2017-02-05 MED ORDER — DICLOFENAC SODIUM 75 MG PO TBEC: 75.0000 mg | DELAYED_RELEASE_TABLET | Freq: Two times a day (BID) | ORAL | 0 refills | Status: DC

## 2017-02-05 MED ORDER — TIZANIDINE HCL 4 MG PO CAPS: 4.0000 mg | ORAL_CAPSULE | Freq: Three times a day (TID) | ORAL | 0 refills | Status: DC | PRN

## 2017-02-05 MED ORDER — TRIAMTERENE-HCTZ 37.5-25 MG PO TABS: 1.0000 | ORAL_TABLET | Freq: Every day | ORAL | 3 refills | Status: DC

## 2017-02-05 MED ORDER — PAROXETINE HCL 30 MG PO TABS: 30.0000 mg | ORAL_TABLET | Freq: Every day | ORAL | 3 refills | Status: DC

## 2017-02-05 NOTE — Progress Notes (Signed)
Date:  02/05/2017   Name:  Tracy Chase   DOB:  Jan 01, 1956   MRN:  [id number]   Chief Complaint: Hyperlipidemia (follow up.) and Hypertension Hyperlipidemia  This is a chronic problem. The problem is controlled. Recent lipid tests were reviewed and are normal. Pertinent negatives include no chest pain or shortness of breath. She is currently on no antihyperlipidemic treatment.  Hypertension  This is a chronic problem. The problem is unchanged. The problem is controlled. Associated symptoms include anxiety. Pertinent negatives include no chest pain, headaches, palpitations or shortness of breath.  Insomnia  Primary symptoms: sleep disturbance.  The problem occurs nightly.  Anxiety  Presents for follow-up visit. Symptoms include insomnia. Patient reports no chest pain, dizziness, palpitations or shortness of breath. Symptoms occur occasionally. The severity of symptoms is mild. The quality of sleep is fair. Nighttime awakenings: occasional.   Compliance with medications: doing well on daily Paxil.      Review of Systems  Constitutional: Negative for chills, fever and unexpected weight change.  Eyes: Negative for visual disturbance.  Respiratory: Negative for chest tightness, shortness of breath and wheezing.   Cardiovascular: Negative for chest pain, palpitations and leg swelling.  Gastrointestinal: Negative for abdominal pain.  Endocrine: Negative for polydipsia and polyuria.  Musculoskeletal: Negative for back pain and neck stiffness.  Neurological: Negative for dizziness and headaches.  Hematological: Negative for adenopathy.  Psychiatric/Behavioral: Positive for sleep disturbance. Negative for dysphoric mood. The patient has insomnia.     Patient Active Problem List   Diagnosis Date Noted  . Anxiety 06/21/2015  . Bloodgood disease 06/21/2015  . Carcinoma of kidney ([place]) 06/21/2015  . Carpal tunnel syndrome 06/21/2015  . Atypical chest pain 06/21/2015  . Polypharmacy  06/21/2015  . Essential (primary) hypertension 06/21/2015  . Fatigue 06/21/2015  . Idiopathic insomnia 06/21/2015    Prior to Admission medications   Medication Sig Start Date End Date Taking? Authorizing Provider  diclofenac (VOLTAREN) 75 MG EC tablet Take 1 tablet (75 mg total) by mouth 2 (two) times daily. 03/02/16  Yes Glean Hess, MD  PARoxetine (PAXIL) 30 MG tablet TAKE ONE TABLET BY MOUTH ONCE DAILY 11/13/16  Yes Glean Hess, MD  tiZANidine (ZANAFLEX) 4 MG capsule Take 1 capsule (4 mg total) by mouth 3 (three) times daily as needed. 03/02/16  Yes Glean Hess, MD  triamterene-hydrochlorothiazide [facility]) 37.5-25 MG tablet TAKE ONE TABLET BY MOUTH ONCE DAILY 06/26/16  Yes Glean Hess, MD  zolpidem (AMBIEN) 10 MG tablet TAKE ONE TABLET BY MOUTH AT BEDTIME 10/25/16  Yes Glean Hess, MD    Allergies  Allergen Reactions  . Hydrocodone Itching    Past Surgical History:  Procedure Laterality Date  . APPENDECTOMY    . cryoablation for renal cell carcinoma  2012  . LUMBAR FUSION    . MEDIAL PARTIAL KNEE REPLACEMENT Left   . PARTIAL HYSTERECTOMY    . TONSILLECTOMY    . VENTRAL HERNIA REPAIR  2012   Dr. Genevive Bi    Social History  Substance Use Topics  . Smoking status: Never Smoker  . Smokeless tobacco: Never Used  . Alcohol use 0.0 oz/week     Comment: socially     Medication list has been reviewed and updated.   Physical Exam  Constitutional: She is oriented to person, place, and time. She appears well-developed. No distress.  HENT:  Head: Normocephalic and atraumatic.  Neck: Normal range of motion. Neck supple. Carotid bruit is not present.  Cardiovascular: Normal rate, regular rhythm and normal heart sounds.   Pulmonary/Chest: Effort normal and breath sounds normal. No respiratory distress. She has no wheezes.  Abdominal: Soft.  Neurological: She is alert and oriented to person, place, and time. She has normal reflexes.  Skin: Skin is warm and  dry. No rash noted.  Psychiatric: She has a normal mood and affect. Her speech is normal and behavior is normal. Thought content normal.  Nursing note and vitals reviewed.   BP 102/68 (BP Location: Right Arm, Patient Position: Sitting, Cuff Size: Normal)   Pulse (!) 58   Ht 5\' 6"  (1.676 m)   Wt 195 lb (88.5 kg)   SpO2 99%   BMI 31.47 kg/m   Assessment and Plan: 1. Essential (primary) hypertension controlled - CBC with Differential/Platelet  2. Carcinoma of right kidney (HCC) - Comprehensive metabolic panel  3. Idiopathic insomnia Continue nightly Ambine  4. Anxiety Stable; controlled on Paxil - PARoxetine (PAXIL) 30 MG tablet; Take 1 tablet (30 mg total) by mouth daily.  Dispense: 90 tablet; Refill: 3  5. Breast cancer screening - MM DIGITAL SCREENING BILATERAL; Future   Meds ordered this encounter  Medications  . zolpidem (AMBIEN) 10 MG tablet    Sig: Take 1 tablet (10 mg total) by mouth at bedtime.    Dispense:  30 tablet    Refill:  5  . triamterene-hydrochlorothiazide (MAXZIDE-25) 37.5-25 MG tablet    Sig: Take 1 tablet by mouth daily.    Dispense:  90 tablet    Refill:  3  . PARoxetine (PAXIL) 30 MG tablet    Sig: Take 1 tablet (30 mg total) by mouth daily.    Dispense:  90 tablet    Refill:  3  . tiZANidine (ZANAFLEX) 4 MG capsule    Sig: Take 1 capsule (4 mg total) by mouth 3 (three) times daily as needed.    Dispense:  270 capsule    Refill:  0  . diclofenac (VOLTAREN) 75 MG EC tablet    Sig: Take 1 tablet (75 mg total) by mouth 2 (two) times daily.    Dispense:  180 tablet    Refill:  0    Halina Maidens, MD [facility]  02/05/2017

## 2017-02-13 ENCOUNTER — Other Ambulatory Visit: Payer: Self-pay | Admitting: Internal Medicine

## 2017-02-13 ENCOUNTER — Telehealth: Payer: Self-pay

## 2017-02-13 MED ORDER — TIZANIDINE HCL 4 MG PO TABS: 4.0000 mg | ORAL_TABLET | Freq: Four times a day (QID) | ORAL | 1 refills | Status: DC | PRN

## 2017-02-13 NOTE — Telephone Encounter (Signed)
Pt called stating [place], [place] said to fax over RX Zanaflex as a tablet and not a capsule for lower price.

## 2017-06-21 ENCOUNTER — Telehealth: Payer: Self-pay

## 2017-06-21 ENCOUNTER — Other Ambulatory Visit: Payer: Self-pay | Admitting: Internal Medicine

## 2017-06-21 MED ORDER — ZOLPIDEM TARTRATE 10 MG PO TABS: 10.0000 mg | ORAL_TABLET | Freq: Every day | ORAL | 0 refills | Status: DC

## 2017-06-21 NOTE — Telephone Encounter (Signed)
Patient requesting refill on zolpidem (AMBIEN) 10 MG tablet.She states she has been in [facility] for the past month, [facility] Rx [facility]. Now she is back in [place] and they will not refill med. She is requesting 1 month supply because she is going to [place]. Last Rx:02/05/2017, x5 refills, Print Last OV: 02/05/17

## 2017-06-21 NOTE — Telephone Encounter (Signed)
Is she going to pick it up?  Or should we fax it to the pharmacy - if so, which one?

## 2017-06-21 NOTE — Telephone Encounter (Addendum)
Spoke to patient. [facility] request [facility] on file.

## 2017-06-24 ENCOUNTER — Ambulatory Visit
Admission: RE | Admit: 2017-06-24 | Discharge: 2017-06-24 | Disposition: A | Discharge status: Home / Self Care | Payer: Disability Insurance | Source: Ambulatory Visit | Attending: Pediatrics | Admitting: Pediatrics

## 2017-06-24 ENCOUNTER — Other Ambulatory Visit: Payer: Self-pay | Admitting: Pediatrics

## 2017-06-24 DIAGNOSIS — M4325 Fusion of spine, thoracolumbar region: Secondary | ICD-10-CM | POA: Diagnosis present

## 2017-06-24 DIAGNOSIS — M1990 Unspecified osteoarthritis, unspecified site: Secondary | ICD-10-CM | POA: Diagnosis not present

## 2017-06-24 DIAGNOSIS — M5136 Other intervertebral disc degeneration, lumbar region: Secondary | ICD-10-CM | POA: Insufficient documentation

## 2017-06-24 DIAGNOSIS — G8929 Other chronic pain: Secondary | ICD-10-CM | POA: Diagnosis not present

## 2017-06-24 DIAGNOSIS — Z981 Arthrodesis status: Secondary | ICD-10-CM | POA: Insufficient documentation

## 2017-06-24 DIAGNOSIS — G4709 Other insomnia: Secondary | ICD-10-CM | POA: Insufficient documentation

## 2017-08-30 ENCOUNTER — Encounter: Payer: Self-pay | Admitting: Internal Medicine

## 2017-08-30 ENCOUNTER — Ambulatory Visit (INDEPENDENT_AMBULATORY_CARE_PROVIDER_SITE_OTHER): Payer: Self-pay | Admitting: Internal Medicine

## 2017-08-30 VITALS — BP 132/80 | HR 58 | Ht 66.0 in | Wt 206.0 lb

## 2017-08-30 DIAGNOSIS — I10 Essential (primary) hypertension: Secondary | ICD-10-CM

## 2017-08-30 DIAGNOSIS — Z23 Encounter for immunization: Secondary | ICD-10-CM

## 2017-08-30 DIAGNOSIS — F5101 Primary insomnia: Secondary | ICD-10-CM

## 2017-08-30 MED ORDER — ZOLPIDEM TARTRATE 10 MG PO TABS: 10.0000 mg | ORAL_TABLET | Freq: Every day | ORAL | 5 refills | Status: DC

## 2017-08-30 NOTE — Progress Notes (Signed)
Date:  08/30/2017   Name:  Tracy Chase   DOB:  June 10, 1956   MRN:  [id number]   Chief Complaint: Insomnia (Refill Ambien. Needs to discuss pharmacy- half time here and half time at [facility]. ) and Immunizations (FLU SHOT - )  Insomnia  Primary symptoms: sleep disturbance, difficulty falling asleep, frequent awakening.  The onset quality is undetermined. The problem occurs nightly. The symptoms are relieved by medication. Prior diagnostic workup includes:  No prior workup.  Hypertension  This is a chronic problem. The problem is unchanged. Pertinent negatives include no chest pain, headaches, palpitations or shortness of breath.      Review of Systems  Constitutional: Negative for chills and fatigue.  Respiratory: Negative for chest tightness, shortness of breath and wheezing.   Cardiovascular: Negative for chest pain, palpitations and leg swelling.  Gastrointestinal: Negative for abdominal pain.  Neurological: Negative for dizziness and headaches.  Psychiatric/Behavioral: Positive for sleep disturbance. The patient has insomnia.     Patient Active Problem List   Diagnosis Date Noted  . Anxiety 06/21/2015  . Bloodgood disease 06/21/2015  . Carcinoma of kidney ([place]) 06/21/2015  . Carpal tunnel syndrome 06/21/2015  . Atypical chest pain 06/21/2015  . Polypharmacy 06/21/2015  . Essential (primary) hypertension 06/21/2015  . Fatigue 06/21/2015  . Idiopathic insomnia 06/21/2015    Prior to Admission medications   Medication Sig Start Date End Date Taking? Authorizing Provider  diclofenac (VOLTAREN) 75 MG EC tablet Take 1 tablet (75 mg total) by mouth 2 (two) times daily. 02/05/17  Yes Glean Hess, MD  PARoxetine (PAXIL) 30 MG tablet Take 1 tablet (30 mg total) by mouth daily. 02/05/17  Yes Glean Hess, MD  tiZANidine (ZANAFLEX) 4 MG tablet Take 1 tablet (4 mg total) by mouth every 6 (six) hours as needed for muscle spasms. 02/13/17  Yes Glean Hess, MD    triamterene-hydrochlorothiazide (MAXZIDE-25) 37.5-25 MG tablet Take 1 tablet by mouth daily. 02/05/17  Yes Glean Hess, MD  zolpidem (AMBIEN) 10 MG tablet Take 1 tablet (10 mg total) by mouth at bedtime. 06/21/17  Yes Glean Hess, MD    Allergies  Allergen Reactions  . Hydrocodone Itching    Past Surgical History:  Procedure Laterality Date  . APPENDECTOMY    . cryoablation for renal cell carcinoma  2012  . LUMBAR FUSION    . MEDIAL PARTIAL KNEE REPLACEMENT Left   . PARTIAL HYSTERECTOMY    . TONSILLECTOMY    . VENTRAL HERNIA REPAIR  2012   Dr. Genevive Bi    Social History  Substance Use Topics  . Smoking status: Never Smoker  . Smokeless tobacco: Never Used  . Alcohol use 0.0 oz/week     Comment: socially     Medication list has been reviewed and updated.  PHQ 2/9 Scores 08/30/2017  PHQ - 2 Score 1    Physical Exam  Constitutional: She is oriented to person, place, and time. She appears well-developed. No distress.  HENT:  Head: Normocephalic and atraumatic.  Neck: Normal range of motion. Neck supple. No thyromegaly present.  Cardiovascular: Normal rate, regular rhythm and normal heart sounds.   Pulmonary/Chest: Effort normal and breath sounds normal. No respiratory distress. She has no wheezes.  Musculoskeletal: Normal range of motion.  Neurological: She is alert and oriented to person, place, and time.  Skin: Skin is warm and dry. No rash noted.  Psychiatric: She has a normal mood and affect. Her behavior is normal.  Thought content normal.  Nursing note and vitals reviewed.   BP 132/80   Pulse (!) 58   Ht 5\' 6"  (1.676 m)   Wt 206 lb (93.4 kg)   SpO2 99%   BMI 33.25 kg/m   Assessment and Plan: 1. Essential (primary) hypertension controlled  2. Idiopathic insomnia Doing well without side effects on Ambien  3. Need for influenza vaccination Pt to check cash price at pharmacy   Meds ordered this encounter  Medications  . zolpidem (AMBIEN) 10  MG tablet    Sig: Take 1 tablet (10 mg total) by mouth at bedtime.    Dispense:  30 tablet    Refill:  5    Partially dictated using Editor, commissioning. Any errors are unintentional.  Halina Maidens, MD [facility]  08/30/2017

## 2017-11-18 ENCOUNTER — Ambulatory Visit (INDEPENDENT_AMBULATORY_CARE_PROVIDER_SITE_OTHER): Payer: Self-pay | Admitting: Internal Medicine

## 2017-11-18 ENCOUNTER — Encounter: Payer: Self-pay | Admitting: Internal Medicine

## 2017-11-18 ENCOUNTER — Other Ambulatory Visit: Payer: Self-pay | Admitting: Internal Medicine

## 2017-11-18 VITALS — BP 118/78 | HR 76 | Ht 66.0 in | Wt 211.0 lb

## 2017-11-18 DIAGNOSIS — I10 Essential (primary) hypertension: Secondary | ICD-10-CM

## 2017-11-18 DIAGNOSIS — F419 Anxiety disorder, unspecified: Secondary | ICD-10-CM

## 2017-11-18 DIAGNOSIS — M5030 Other cervical disc degeneration, unspecified cervical region: Secondary | ICD-10-CM

## 2017-11-18 DIAGNOSIS — F5101 Primary insomnia: Secondary | ICD-10-CM

## 2017-11-18 MED ORDER — CYCLOBENZAPRINE HCL 10 MG PO TABS: 10.0000 mg | ORAL_TABLET | Freq: Three times a day (TID) | ORAL | 0 refills | Status: DC | PRN

## 2017-11-18 MED ORDER — ZOLPIDEM TARTRATE 10 MG PO TABS: 10.0000 mg | ORAL_TABLET | Freq: Every day | ORAL | 0 refills | Status: DC

## 2017-11-18 MED ORDER — PAROXETINE HCL 30 MG PO TABS: 30.0000 mg | ORAL_TABLET | Freq: Every day | ORAL | 0 refills | Status: DC

## 2017-11-18 NOTE — Progress Notes (Signed)
Date:  11/18/2017   Name:  Tracy Chase   DOB:  1956/05/05   MRN:  [id number]   Chief Complaint: Arm Numbness (Numbness in both arms at night. Started in the middle of the night about a week ago. Felt tingling in hands and arms. No swelling.  )  Numbness - over the past few months waking up at night with numbness in both arms.  No pain or weakness.  If she changes position, the numbness resolves.  It does not occur during the day.  She previously took flexeril but had to work. Now retired and would like to try it again.  Insomnia - taking ambien with good results.  She is here for a while to care for her parents and [place]. Would like a one month refill.  HTN - on HCTZ daily.  No chest pain or SOB.  Review of Systems  Constitutional: Negative for chills, fatigue and fever.  Respiratory: Negative for chest tightness and shortness of breath.   Cardiovascular: Negative for chest pain.  Musculoskeletal: Positive for back pain. Negative for arthralgias, gait problem, myalgias and neck pain.  Neurological: Positive for numbness. Negative for dizziness, speech difficulty, weakness and headaches.  Psychiatric/Behavioral: Positive for sleep disturbance.    Patient Active Problem List   Diagnosis Date Noted  . Degenerative disc disease, cervical 11/18/2017  . Anxiety 06/21/2015  . Bloodgood disease 06/21/2015  . Carcinoma of kidney ([place]) 06/21/2015  . Carpal tunnel syndrome 06/21/2015  . Essential (primary) hypertension 06/21/2015  . Idiopathic insomnia 06/21/2015    Prior to Admission medications   Medication Sig Start Date End Date Taking? Authorizing Provider  diclofenac (VOLTAREN) 75 MG EC tablet Take 1 tablet (75 mg total) by mouth 2 (two) times daily. 02/05/17  Yes Glean Hess, MD  PARoxetine (PAXIL) 30 MG tablet Take 1 tablet (30 mg total) by mouth daily. 02/05/17  Yes Glean Hess, MD  tiZANidine (ZANAFLEX) 4 MG tablet Take 1 tablet (4 mg total) by  mouth every 6 (six) hours as needed for muscle spasms. 02/13/17  Yes Glean Hess, MD  triamterene-hydrochlorothiazide (MAXZIDE-25) 37.5-25 MG tablet Take 1 tablet by mouth daily. 02/05/17  Yes Glean Hess, MD  zolpidem (AMBIEN) 10 MG tablet Take 1 tablet (10 mg total) by mouth at bedtime. 08/30/17  Yes Glean Hess, MD    Allergies  Allergen Reactions  . Hydrocodone Itching    Past Surgical History:  Procedure Laterality Date  . APPENDECTOMY    . cryoablation for renal cell carcinoma  2012  . LUMBAR FUSION    . MEDIAL PARTIAL KNEE REPLACEMENT Left   . PARTIAL HYSTERECTOMY    . TONSILLECTOMY    . VENTRAL HERNIA REPAIR  2012   Dr. Genevive Bi    Social History   Tobacco Use  . Smoking status: Never Smoker  . Smokeless tobacco: Never Used  Substance Use Topics  . Alcohol use: Yes    Alcohol/week: 0.0 oz    Comment: socially  . Drug use: No     Medication list has been reviewed and updated.  PHQ 2/9 Scores 08/30/2017  PHQ - 2 Score 1    Physical Exam  Constitutional: She is oriented to person, place, and time. She appears well-developed. No distress.  HENT:  Head: Normocephalic and atraumatic.  Neck: Normal range of motion. Neck supple. No thyromegaly present.  Cardiovascular: Normal rate, regular rhythm and normal heart sounds.  Pulmonary/Chest: Effort normal  and breath sounds normal. No respiratory distress. She has no wheezes.  Musculoskeletal: She exhibits no edema.       Right shoulder: She exhibits normal range of motion and no tenderness.       Left shoulder: She exhibits normal range of motion and no tenderness.       Right wrist: She exhibits no tenderness.       Left wrist: She exhibits no tenderness.       Cervical back: She exhibits normal range of motion, no tenderness and no bony tenderness.  Tinels and Phalens negative bilaterally  Neurological: She is alert and oriented to person, place, and time. She has normal strength. No cranial nerve  deficit or sensory deficit.  Skin: Skin is warm and dry. No rash noted.  Psychiatric: She has a normal mood and affect. Her speech is normal and behavior is normal. Thought content normal.  Nursing note and vitals reviewed.   BP 118/78   Pulse 76   Ht 5\' 6"  (1.676 m)   Wt 211 lb (95.7 kg)   SpO2 99%   BMI 34.06 kg/m   Assessment and Plan: 1. Essential (primary) hypertension controlled  2. Idiopathic insomnia Will give a one month supple [place] beach - zolpidem (AMBIEN) 10 MG tablet; Take 1 tablet (10 mg total) by mouth at bedtime.  Dispense: 30 tablet; Refill: 0  3. Degenerative disc disease, cervical Sleep positioning recommendations given Stop tizanidine and start flexeril - cyclobenzaprine (FLEXERIL) 10 MG tablet; Take 1 tablet (10 mg total) by mouth 3 (three) times daily as needed for muscle spasms.  Dispense: 90 tablet; Refill: 0   Meds ordered this encounter  Medications  . cyclobenzaprine (FLEXERIL) 10 MG tablet    Sig: Take 1 tablet (10 mg total) by mouth 3 (three) times daily as needed for muscle spasms.    Dispense:  90 tablet    Refill:  0  . zolpidem (AMBIEN) 10 MG tablet    Sig: Take 1 tablet (10 mg total) by mouth at bedtime.    Dispense:  30 tablet    Refill:  0    Partially dictated using Editor, commissioning. Any errors are unintentional.  Halina Maidens, MD [facility]  11/18/2017

## 2018-02-07 ENCOUNTER — Ambulatory Visit: Payer: Self-pay | Admitting: Internal Medicine

## 2018-03-19 ENCOUNTER — Encounter: Payer: Self-pay | Admitting: Internal Medicine

## 2018-03-19 ENCOUNTER — Ambulatory Visit (INDEPENDENT_AMBULATORY_CARE_PROVIDER_SITE_OTHER): Payer: Self-pay | Admitting: Internal Medicine

## 2018-03-19 VITALS — BP 124/82 | HR 84 | Resp 16 | Ht 66.0 in | Wt 216.0 lb

## 2018-03-19 DIAGNOSIS — M7750 Other enthesopathy of unspecified foot: Secondary | ICD-10-CM

## 2018-03-19 DIAGNOSIS — Z1239 Encounter for other screening for malignant neoplasm of breast: Secondary | ICD-10-CM

## 2018-03-19 DIAGNOSIS — F5101 Primary insomnia: Secondary | ICD-10-CM

## 2018-03-19 DIAGNOSIS — Z1231 Encounter for screening mammogram for malignant neoplasm of breast: Secondary | ICD-10-CM

## 2018-03-19 DIAGNOSIS — F419 Anxiety disorder, unspecified: Secondary | ICD-10-CM

## 2018-03-19 DIAGNOSIS — I10 Essential (primary) hypertension: Secondary | ICD-10-CM

## 2018-03-19 MED ORDER — MELOXICAM 7.5 MG PO TABS: 7.5000 mg | ORAL_TABLET | Freq: Every day | ORAL | 0 refills | Status: DC

## 2018-03-19 MED ORDER — PAROXETINE HCL 30 MG PO TABS: 30.0000 mg | ORAL_TABLET | Freq: Every day | ORAL | 3 refills | Status: DC

## 2018-03-19 MED ORDER — TRIAMTERENE-HCTZ 37.5-25 MG PO TABS: 1.0000 | ORAL_TABLET | Freq: Every day | ORAL | 3 refills | Status: DC

## 2018-03-19 MED ORDER — ZOLPIDEM TARTRATE 10 MG PO TABS: 10.0000 mg | ORAL_TABLET | Freq: Every day | ORAL | 1 refills | Status: DC

## 2018-03-19 NOTE — Patient Instructions (Signed)

## 2018-03-19 NOTE — Progress Notes (Signed)
Date:  03/19/2018   Name:  Tracy Chase   DOB:  1955/12/05   MRN:  [id number]   Chief Complaint: Hypertension (6 mo fu ) Hypertension  This is a chronic problem. The problem is controlled. Associated symptoms include anxiety. Pertinent negatives include no chest pain, headaches, palpitations or shortness of breath. Past treatments include diuretics. The current treatment provides significant improvement. Hypertensive end-organ damage includes kidney disease.  Insomnia  Primary symptoms: sleep disturbance.  The problem occurs nightly. Past treatments include medication.  Foot Injury   There was no injury mechanism (just persistent heel pain). The pain has been improving (after ortho eval and starting mobic) since onset. She has tried NSAIDs for the symptoms. The treatment provided significant relief.  Anxiety  Presents for follow-up visit. Symptoms include insomnia. Patient reports no chest pain, dizziness, palpitations or shortness of breath. Symptoms occur rarely. The quality of sleep is good.      Review of Systems  Constitutional: Negative for chills, fatigue and fever.  Respiratory: Negative for cough, chest tightness, shortness of breath and wheezing.   Cardiovascular: Negative for chest pain and palpitations.  Genitourinary:       No breast problems  Musculoskeletal: Positive for arthralgias. Negative for gait problem and joint swelling.  Skin: Negative for color change and rash.  Allergic/Immunologic: Negative for environmental allergies.  Neurological: Negative for dizziness and headaches.  Psychiatric/Behavioral: Positive for sleep disturbance. Negative for dysphoric mood. The patient has insomnia. The patient is not hyperactive.     Patient Active Problem List   Diagnosis Date Noted  . Degenerative disc disease, cervical 11/18/2017  . Anxiety 06/21/2015  . Bloodgood disease 06/21/2015  . Carcinoma of kidney ([place]) 06/21/2015  . Carpal tunnel syndrome 06/21/2015  .  Essential (primary) hypertension 06/21/2015  . Idiopathic insomnia 06/21/2015    Prior to Admission medications   Medication Sig Start Date End Date Taking? Authorizing Provider  cyclobenzaprine (FLEXERIL) 10 MG tablet Take 1 tablet (10 mg total) by mouth 3 (three) times daily as needed for muscle spasms. 11/18/17  Yes Glean Hess, MD  PARoxetine (PAXIL) 30 MG tablet Take 1 tablet (30 mg total) by mouth daily. 11/18/17  Yes Glean Hess, MD  triamterene-hydrochlorothiazide (MAXZIDE-25) 37.5-25 MG tablet Take 1 tablet by mouth daily. 02/05/17  Yes Glean Hess, MD  zolpidem (AMBIEN) 10 MG tablet Take 1 tablet (10 mg total) by mouth at bedtime. 11/18/17  Yes Glean Hess, MD  meloxicam (MOBIC) 7.5 MG tablet Take 7.5 mg by mouth daily.    [provider]    Allergies  Allergen Reactions  . Hydrocodone Itching    Past Surgical History:  Procedure Laterality Date  . APPENDECTOMY    . cryoablation for renal cell carcinoma  2012  . LUMBAR FUSION    . MEDIAL PARTIAL KNEE REPLACEMENT Left   . PARTIAL HYSTERECTOMY    . TONSILLECTOMY    . VENTRAL HERNIA REPAIR  2012   Dr. Genevive Bi    Social History   Tobacco Use  . Smoking status: Never Smoker  . Smokeless tobacco: Never Used  Substance Use Topics  . Alcohol use: Yes    Alcohol/week: 0.0 oz    Comment: socially  . Drug use: No     Medication list has been reviewed and updated.  PHQ 2/9 Scores 08/30/2017  PHQ - 2 Score 1    Physical Exam  Constitutional: She is oriented to person, place, and time. She appears well-developed.  No distress.  HENT:  Head: Normocephalic and atraumatic.  Eyes: Pupils are equal, round, and reactive to light.  Neck: Normal range of motion. Neck supple. No thyromegaly present.  Cardiovascular: Normal rate, regular rhythm and normal heart sounds.  Pulmonary/Chest: Effort normal and breath sounds normal. No respiratory distress.  Musculoskeletal: Normal range of motion.    Neurological: She is alert and oriented to person, place, and time.  Skin: Skin is warm and dry. No rash noted.  Psychiatric: She has a normal mood and affect. Her behavior is normal. Thought content normal.  Nursing note and vitals reviewed.   BP 124/82   Pulse 84   Resp 16   Ht 5\' 6"  (1.676 m)   Wt 216 lb (98 kg)   SpO2 100%   BMI 34.86 kg/m   Assessment and Plan: 1. Essential (primary) hypertension controlled - triamterene-hydrochlorothiazide (MAXZIDE-25) 37.5-25 MG tablet; Take 1 tablet by mouth daily.  Dispense: 90 tablet; Refill: 3 - Basic metabolic panel  2. Anxiety Doing well with medication - PARoxetine (PAXIL) 30 MG tablet; Take 1 tablet (30 mg total) by mouth daily.  Dispense: 90 tablet; Refill: 3  3. Idiopathic insomnia Continue Ambien - zolpidem (AMBIEN) 10 MG tablet; Take 1 tablet (10 mg total) by mouth at bedtime.  Dispense: 90 tablet; Refill: 1  4. Bone spur of foot Much improved - use sparingly to prevent renal damage - meloxicam (MOBIC) 7.5 MG tablet; Take 1 tablet (7.5 mg total) by mouth daily.  Dispense: 90 tablet; Refill: 0  5. Breast cancer screening - MM DIGITAL SCREENING BILATERAL; Future   Meds ordered this encounter  Medications  . triamterene-hydrochlorothiazide (MAXZIDE-25) 37.5-25 MG tablet    Sig: Take 1 tablet by mouth daily.    Dispense:  90 tablet    Refill:  3  . zolpidem (AMBIEN) 10 MG tablet    Sig: Take 1 tablet (10 mg total) by mouth at bedtime.    Dispense:  90 tablet    Refill:  1  . PARoxetine (PAXIL) 30 MG tablet    Sig: Take 1 tablet (30 mg total) by mouth daily.    Dispense:  90 tablet    Refill:  3  . meloxicam (MOBIC) 7.5 MG tablet    Sig: Take 1 tablet (7.5 mg total) by mouth daily.    Dispense:  90 tablet    Refill:  0    Partially dictated using Editor, commissioning. Any errors are unintentional.  Halina Maidens, MD [facility]  03/19/2018

## 2018-03-20 LAB — BASIC METABOLIC PANEL
BUN/Creatinine Ratio: 13 (ref 12–28)
BUN: 12 mg/dL (ref 8–27)
CO2: 27 mmol/L (ref 20–29)
Calcium: 9.7 mg/dL (ref 8.7–10.3)
Chloride: 99 mmol/L (ref 96–106)
Creatinine, Ser: 0.94 mg/dL (ref 0.57–1.00)
GFR calc Af Amer: 74 mL/min/{1.73_m2} (ref >59)
GFR calc non Af Amer: 64 mL/min/{1.73_m2} (ref >59)
Glucose: 100 mg/dL — ABNORMAL HIGH (ref 65–99)
Potassium: 3.7 mmol/L (ref 3.5–5.2)
Sodium: 143 mmol/L (ref 134–144)

## 2018-03-31 ENCOUNTER — Ambulatory Visit (INDEPENDENT_AMBULATORY_CARE_PROVIDER_SITE_OTHER): Payer: Self-pay | Admitting: Internal Medicine

## 2018-03-31 ENCOUNTER — Encounter: Payer: Self-pay | Admitting: Internal Medicine

## 2018-03-31 VITALS — BP 122/82 | HR 84 | Temp 97.9°F | Resp 16 | Ht 66.0 in | Wt 215.0 lb

## 2018-03-31 DIAGNOSIS — M62830 Muscle spasm of back: Secondary | ICD-10-CM

## 2018-03-31 DIAGNOSIS — R10821 Right upper quadrant rebound abdominal tenderness: Secondary | ICD-10-CM

## 2018-03-31 NOTE — Patient Instructions (Addendum)
Flexeril three times a day - 1/2 twice a day and whole one at bedtime Meloxicam 15 mg once a day Heat or ice to sore back and chest Low fat diet to avoid irritating your gall bladder

## 2018-03-31 NOTE — Progress Notes (Signed)
Date:  03/31/2018   Name:  Tracy Chase   DOB:  01-Apr-1956   MRN:  [id number]   Chief Complaint: Pain (pain all over body. Woke up a week ago and felt maybe she was having panic attack. Has had pain since then in chest and shoulders and some in back x i week. Had pain last night in back where it was broken in past  Feels as though she was hit by a truck. She also self treated herself for yeast inf and states she is thinking these pains could mean she had heart attack or bladder inf instead. )  She traveled to [place] and back over the weekend last week.  When she got home she had chest wall pain and tenderness, pain and stiffness in her shoulders and upper back.  Also intermittent sharp pain in her mid back where she has a compression fracture. She admitted to being very anxious while traveling but did not lift anything heavy, no falls, no stumbling or twisting.   She has flexeril and mobic but has not taken any.  She has used heat. She also had RUQ sharp fleeing pains lasting only seconds on several occasions last week.  No change in bowel habits, no fever, chills or nausea.  Review of Systems  Constitutional: Negative for chills, fatigue and fever.  Respiratory: Negative for cough, chest tightness and shortness of breath.   Cardiovascular: Positive for chest pain. Negative for palpitations and leg swelling.  Gastrointestinal: Positive for abdominal pain and constipation. Negative for diarrhea and nausea.  Genitourinary: Negative for dysuria and frequency.  Musculoskeletal: Positive for arthralgias, myalgias and neck stiffness.  Neurological: Negative for dizziness and headaches.    Patient Active Problem List   Diagnosis Date Noted  . Bone spur of foot 03/19/2018  . Degenerative disc disease, cervical 11/18/2017  . Anxiety 06/21/2015  . Bloodgood disease 06/21/2015  . History of renal cell cancer 06/21/2015  . Carpal tunnel syndrome 06/21/2015  . Essential (primary) hypertension  06/21/2015  . Idiopathic insomnia 06/21/2015    Prior to Admission medications   Medication Sig Start Date End Date Taking? Authorizing Provider  cyclobenzaprine (FLEXERIL) 10 MG tablet Take 1 tablet (10 mg total) by mouth 3 (three) times daily as needed for muscle spasms. 11/18/17  Yes Glean Hess, MD  meloxicam (MOBIC) 7.5 MG tablet Take 1 tablet (7.5 mg total) by mouth daily. 03/19/18  Yes Glean Hess, MD  PARoxetine (PAXIL) 30 MG tablet Take 1 tablet (30 mg total) by mouth daily. 03/19/18  Yes Glean Hess, MD  triamterene-hydrochlorothiazide (MAXZIDE-25) 37.5-25 MG tablet Take 1 tablet by mouth daily. 03/19/18  Yes Glean Hess, MD  zolpidem (AMBIEN) 10 MG tablet Take 1 tablet (10 mg total) by mouth at bedtime. 03/19/18  Yes Glean Hess, MD    Allergies  Allergen Reactions  . Hydrocodone Itching    Past Surgical History:  Procedure Laterality Date  . APPENDECTOMY    . cryoablation for renal cell carcinoma  2012  . LUMBAR FUSION    . MEDIAL PARTIAL KNEE REPLACEMENT Left   . PARTIAL HYSTERECTOMY    . TONSILLECTOMY    . VENTRAL HERNIA REPAIR  2012   Dr. Genevive Bi    Social History   Tobacco Use  . Smoking status: Never Smoker  . Smokeless tobacco: Never Used  Substance Use Topics  . Alcohol use: Yes    Alcohol/week: 0.0 oz    Comment: socially  .  Drug use: No     Medication list has been reviewed and updated.  PHQ 2/9 Scores 08/30/2017  PHQ - 2 Score 1    Physical Exam  Constitutional: She is oriented to person, place, and time. She appears well-developed. No distress.  HENT:  Head: Normocephalic and atraumatic.  Cardiovascular: Normal rate, regular rhythm and normal heart sounds.  Pulmonary/Chest: Effort normal and breath sounds normal. No respiratory distress. She has no wheezes. She exhibits tenderness. She exhibits no deformity, no swelling and no retraction.  Abdominal: Soft. Normal appearance and bowel sounds are normal. There is tenderness  in the right upper quadrant. There is no rigidity, no guarding, no tenderness at McBurney's point and negative Murphy's sign.  Musculoskeletal:       Cervical back: She exhibits decreased range of motion and spasm.  No spinal tenderness  Lymphadenopathy:    She has no cervical adenopathy.  Neurological: She is alert and oriented to person, place, and time.  Skin: Skin is warm and dry. No rash noted.  Psychiatric: She has a normal mood and affect. Her behavior is normal. Thought content normal.  Nursing note and vitals reviewed.   BP 122/82   Pulse 84   Temp 97.9 F (36.6 C) (Oral)   Resp 16   Ht 5\' 6"  (1.676 m)   Wt 215 lb (97.5 kg)   SpO2 98%   BMI 34.70 kg/m   Assessment and Plan: 1. Muscle spasm of back And shoulders with chest wall discomfort Resume flexeril tid with mobic 15 mg daily Continue ice or heat Call in several days if not improving  2. Right upper quadrant abdominal tenderness with rebound tenderness May be spasm or gall bladder related Advised low fat diet; follow up or call if sx become more frequent or persistent   No orders of the defined types were placed in this encounter.   Partially dictated using Editor, commissioning. Any errors are unintentional.  Halina Maidens, MD [facility]  03/31/2018

## 2018-04-28 ENCOUNTER — Ambulatory Visit: Payer: Disability Insurance

## 2018-06-02 ENCOUNTER — Ambulatory Visit: Payer: Self-pay

## 2018-06-20 ENCOUNTER — Other Ambulatory Visit: Payer: Self-pay | Admitting: Internal Medicine

## 2018-06-20 DIAGNOSIS — M7750 Other enthesopathy of unspecified foot: Secondary | ICD-10-CM

## 2018-07-15 ENCOUNTER — Telehealth: Payer: Self-pay

## 2018-07-15 ENCOUNTER — Ambulatory Visit: Payer: Self-pay | Admitting: Internal Medicine

## 2018-07-15 NOTE — Progress Notes (Deleted)
    Date:  07/15/2018   Name:  Tracy Chase   DOB:  October 05, 1956   MRN:  [id number]   Chief Complaint: No chief complaint on file. Anxiety  Presents for follow-up visit.    Hypertension  This is a chronic problem. The problem is controlled. Associated symptoms include anxiety. Past treatments include diuretics. The current treatment provides significant improvement.   BP Readings from Last 3 Encounters:  03/31/18 122/82  03/19/18 124/82  11/18/17 118/78      Review of Systems  Patient Active Problem List   Diagnosis Date Noted  . Bone spur of foot 03/19/2018  . Degenerative disc disease, cervical 11/18/2017  . Anxiety 06/21/2015  . Bloodgood disease 06/21/2015  . History of renal cell cancer 06/21/2015  . Carpal tunnel syndrome 06/21/2015  . Essential (primary) hypertension 06/21/2015  . Idiopathic insomnia 06/21/2015    Allergies  Allergen Reactions  . Hydrocodone Itching    Past Surgical History:  Procedure Laterality Date  . APPENDECTOMY    . cryoablation for renal cell carcinoma  2012  . LUMBAR FUSION    . MEDIAL PARTIAL KNEE REPLACEMENT Left   . PARTIAL HYSTERECTOMY    . TONSILLECTOMY    . VENTRAL HERNIA REPAIR  2012   Dr. Genevive Bi    Social History   Tobacco Use  . Smoking status: Never Smoker  . Smokeless tobacco: Never Used  Substance Use Topics  . Alcohol use: Yes    Alcohol/week: 0.0 standard drinks    Comment: socially  . Drug use: No     Medication list has been reviewed and updated.  No outpatient medications have been marked as taking for the 07/15/18 encounter (Appointment) with Glean Hess, MD.    [facility] 2/9 Scores 08/30/2017  PHQ - 2 Score 1    Physical Exam  There were no vitals taken for this visit.  Assessment and Plan:

## 2018-07-15 NOTE — Telephone Encounter (Signed)
Patient called today stating that she stopped taking PAXIL medication and started back up taking Prozac 20 mg. She said this medication was previously prescribed by her previous PCP and wanted to know if Dr Army Melia would refill it before she goes out of town. I explained to the patient that since Dr Army Melia did not prescribe this medication she would need to come in for a OV to discuss medication but its not promised that Dr Army Melia will prescribe her this medication.  She verbalized understanding and did say she is competently out of medication and needs it refilled before Thursday when she goes out of town.

## 2018-07-21 ENCOUNTER — Ambulatory Visit: Payer: Self-pay

## 2018-07-31 ENCOUNTER — Ambulatory Visit: Payer: Self-pay | Admitting: Internal Medicine

## 2018-08-01 ENCOUNTER — Encounter: Payer: Self-pay | Admitting: Internal Medicine

## 2018-08-01 ENCOUNTER — Ambulatory Visit (INDEPENDENT_AMBULATORY_CARE_PROVIDER_SITE_OTHER): Payer: Self-pay | Admitting: Internal Medicine

## 2018-08-01 VITALS — BP 118/78 | HR 76 | Ht 66.0 in | Wt 188.0 lb

## 2018-08-01 DIAGNOSIS — F5101 Primary insomnia: Secondary | ICD-10-CM

## 2018-08-01 DIAGNOSIS — I10 Essential (primary) hypertension: Secondary | ICD-10-CM

## 2018-08-01 DIAGNOSIS — F419 Anxiety disorder, unspecified: Secondary | ICD-10-CM

## 2018-08-01 MED ORDER — ZOLPIDEM TARTRATE 10 MG PO TABS: 10.0000 mg | ORAL_TABLET | Freq: Every day | ORAL | 1 refills | Status: DC

## 2018-08-01 MED ORDER — FLUOXETINE HCL 20 MG PO TABS: 20.0000 mg | ORAL_TABLET | Freq: Every day | ORAL | 1 refills | Status: DC

## 2018-08-01 NOTE — Progress Notes (Signed)
Date:  08/01/2018   Name:  Tracy Chase   DOB:  December 11, 1955   MRN:  [id number]   Chief Complaint: No chief complaint on file.  Depression         This is a chronic problem.  The problem has been gradually improving since onset.  Associated symptoms include no fatigue, no appetite change and no headaches.  Past treatments include SSRIs - Selective serotonin reuptake inhibitors.  Compliance with treatment is good.  Previous treatment provided significant relief. Hypertension  This is a chronic problem. The problem is controlled. Pertinent negatives include no chest pain, headaches, palpitations or shortness of breath. Past treatments include diuretics.  Pt was taking Paxil for anxiety disorder with mild depression.  She went to a weight loss clinic in [facility] and they told her she had to stop Paxil.  They started her on Prozac and B12 injections along with a low carb diet/exercise. She has done well with the Paxil.  Losing weight steadily.  She would like a refill if I agree with the change.  Review of Systems  Constitutional: Negative for appetite change, fatigue, fever and unexpected weight change.  HENT: Negative for tinnitus and trouble swallowing.   Eyes: Negative for visual disturbance.  Respiratory: Negative for cough, chest tightness and shortness of breath.   Cardiovascular: Negative for chest pain, palpitations and leg swelling.  Gastrointestinal: Negative for abdominal pain.  Endocrine: Negative for polydipsia and polyuria.  Genitourinary: Negative for dysuria and hematuria.  Musculoskeletal: Negative for arthralgias.  Neurological: Negative for dizziness, tremors, numbness and headaches.  Psychiatric/Behavioral: Positive for depression. Negative for dysphoric mood and sleep disturbance. The patient is not nervous/anxious.     Patient Active Problem List   Diagnosis Date Noted  . Bone spur of foot 03/19/2018  . Degenerative disc disease, cervical 11/18/2017  . Anxiety  06/21/2015  . Bloodgood disease 06/21/2015  . History of renal cell cancer 06/21/2015  . Carpal tunnel syndrome 06/21/2015  . Essential (primary) hypertension 06/21/2015  . Idiopathic insomnia 06/21/2015    Allergies  Allergen Reactions  . Hydrocodone Itching    Past Surgical History:  Procedure Laterality Date  . APPENDECTOMY    . cryoablation for renal cell carcinoma  2012  . LUMBAR FUSION    . MEDIAL PARTIAL KNEE REPLACEMENT Left   . PARTIAL HYSTERECTOMY    . TONSILLECTOMY    . VENTRAL HERNIA REPAIR  2012   Dr. Genevive Bi    Social History   Tobacco Use  . Smoking status: Never Smoker  . Smokeless tobacco: Never Used  Substance Use Topics  . Alcohol use: Yes    Alcohol/week: 0.0 standard drinks    Comment: socially  . Drug use: No     Medication list has been reviewed and updated.  Current Meds  Medication Sig  . cyclobenzaprine (FLEXERIL) 10 MG tablet Take 1 tablet (10 mg total) by mouth 3 (three) times daily as needed for muscle spasms.  Marland Kitchen FLUoxetine (PROZAC) 20 MG tablet Take 20 mg by mouth daily.  . meloxicam (MOBIC) 7.5 MG tablet TAKE 1 TABLET BY MOUTH EVERY DAY  . triamterene-hydrochlorothiazide (MAXZIDE-25) 37.5-25 MG tablet Take 1 tablet by mouth daily.  Marland Kitchen zolpidem (AMBIEN) 10 MG tablet Take 1 tablet (10 mg total) by mouth at bedtime.    PHQ 2/9 Scores 08/01/2018 08/30/2017  PHQ - 2 Score 0 1    Physical Exam  Constitutional: She is oriented to person, place, and time. She appears well-developed.  No distress.  HENT:  Head: Normocephalic and atraumatic.  Neck: Normal range of motion. Neck supple.  Cardiovascular: Normal rate, regular rhythm and normal heart sounds.  Pulmonary/Chest: Effort normal and breath sounds normal. No respiratory distress.  Musculoskeletal: Normal range of motion. She exhibits edema.  Neurological: She is alert and oriented to person, place, and time.  Skin: Skin is warm and dry. Laceration (skin tear on left forearm) noted. No  rash noted.  Psychiatric: She has a normal mood and affect. Her behavior is normal. Thought content normal.  Nursing note and vitals reviewed.   BP 118/78 (BP Location: Right Arm, Patient Position: Sitting, Cuff Size: Normal)   Pulse 76   Ht 5\' 6"  (1.676 m)   Wt 188 lb (85.3 kg)   SpO2 100%   BMI 30.34 kg/m   Assessment and Plan: 1. Idiopathic insomnia May refill in November - zolpidem (AMBIEN) 10 MG tablet; Take 1 tablet (10 mg total) by mouth at bedtime.  Dispense: 90 tablet; Refill: 1  2. Essential (primary) hypertension controlled  3. Anxiety Continue prozac Follow up 6 months - FLUoxetine (PROZAC) 20 MG tablet; Take 1 tablet (20 mg total) by mouth daily.  Dispense: 90 tablet; Refill: 1   Partially dictated using Editor, commissioning. Any errors are unintentional.  Halina Maidens, MD [facility]  08/01/2018

## 2018-08-04 ENCOUNTER — Other Ambulatory Visit: Payer: Self-pay

## 2018-08-04 DIAGNOSIS — F419 Anxiety disorder, unspecified: Secondary | ICD-10-CM

## 2018-08-04 MED ORDER — FLUOXETINE HCL 20 MG PO CAPS: 20.0000 mg | ORAL_CAPSULE | Freq: Every day | ORAL | 1 refills | Status: DC

## 2018-08-04 NOTE — Progress Notes (Signed)
Patient called stating her insurance will not pay for tablet form of Prozac so she needed capsules called in to [facility] instead.  Sent in Capsule's and called patient to inform.

## 2018-08-29 ENCOUNTER — Other Ambulatory Visit: Payer: Self-pay

## 2018-08-29 DIAGNOSIS — I10 Essential (primary) hypertension: Secondary | ICD-10-CM

## 2018-08-29 MED ORDER — TRIAMTERENE-HCTZ 37.5-25 MG PO TABS: 1.0000 | ORAL_TABLET | Freq: Every day | ORAL | 0 refills | Status: DC

## 2018-08-29 NOTE — Progress Notes (Signed)
Patient called stating she is now at [facility] for 2 weeks and left her Bp/ fluid pill medication at home. Wanted to know if we could send 2 weeks worth of medication in to [facility].  Sent medication with patient on the phone and verified pharmacy.

## 2018-09-09 ENCOUNTER — Encounter: Payer: Self-pay | Admitting: Internal Medicine

## 2018-09-09 ENCOUNTER — Ambulatory Visit (INDEPENDENT_AMBULATORY_CARE_PROVIDER_SITE_OTHER): Payer: Self-pay | Admitting: Internal Medicine

## 2018-09-09 VITALS — BP 112/68 | HR 59 | Temp 97.8°F | Ht 66.0 in | Wt 174.0 lb

## 2018-09-09 DIAGNOSIS — J0100 Acute maxillary sinusitis, unspecified: Secondary | ICD-10-CM

## 2018-09-09 MED ORDER — DOXYCYCLINE HYCLATE 100 MG PO TABS: 100.0000 mg | ORAL_TABLET | Freq: Two times a day (BID) | ORAL | 0 refills | Status: AC

## 2018-09-09 MED ORDER — GUAIFENESIN-CODEINE 100-10 MG/5ML PO SYRP: 5.0000 mL | ORAL_SOLUTION | Freq: Three times a day (TID) | ORAL | 0 refills | Status: DC | PRN

## 2018-09-09 NOTE — Progress Notes (Signed)
Date:  09/09/2018   Name:  Tracy Chase   DOB:  02-Jul-1956   MRN:  [id number]   Chief Complaint: Cough (X 1 week. Green/ yellow production. Chest and shoulder blades aching from coughing. Started in face with ear pain and now in chest. )  Sinus Problem  This is a new problem. The current episode started in the past 7 days. Associated symptoms include congestion, coughing and sinus pressure. Pertinent negatives include no chills or headaches.  Cough  This is a new problem. The current episode started in the past 7 days. The cough is productive of sputum. Associated symptoms include chest pain and wheezing. Pertinent negatives include no chills, fever or headaches. The treatment provided mild relief.    Review of Systems  Constitutional: Negative for chills, fatigue and fever.  HENT: Positive for congestion and sinus pressure.   Respiratory: Positive for cough and wheezing. Negative for choking and chest tightness.   Cardiovascular: Positive for chest pain. Negative for palpitations.  Neurological: Negative for dizziness and headaches.    Patient Active Problem List   Diagnosis Date Noted  . Bone spur of foot 03/19/2018  . Degenerative disc disease, cervical 11/18/2017  . Anxiety 06/21/2015  . Bloodgood disease 06/21/2015  . History of renal cell cancer 06/21/2015  . Carpal tunnel syndrome 06/21/2015  . Essential (primary) hypertension 06/21/2015  . Idiopathic insomnia 06/21/2015    Allergies  Allergen Reactions  . Hydrocodone Itching    Past Surgical History:  Procedure Laterality Date  . APPENDECTOMY    . cryoablation for renal cell carcinoma  2012  . LUMBAR FUSION    . MEDIAL PARTIAL KNEE REPLACEMENT Left   . PARTIAL HYSTERECTOMY    . TONSILLECTOMY    . VENTRAL HERNIA REPAIR  2012   Dr. Genevive Bi    Social History   Tobacco Use  . Smoking status: Never Smoker  . Smokeless tobacco: Never Used  Substance Use Topics  . Alcohol use: Yes    Alcohol/week: 0.0  standard drinks    Comment: socially  . Drug use: No     Medication list has been reviewed and updated.  Current Meds  Medication Sig  . cyclobenzaprine (FLEXERIL) 10 MG tablet Take 1 tablet (10 mg total) by mouth 3 (three) times daily as needed for muscle spasms.  Marland Kitchen FLUoxetine (PROZAC) 20 MG capsule Take 1 capsule (20 mg total) by mouth daily.  . meloxicam (MOBIC) 7.5 MG tablet TAKE 1 TABLET BY MOUTH EVERY DAY  . triamterene-hydrochlorothiazide (MAXZIDE-25) 37.5-25 MG tablet Take 1 tablet by mouth daily.  Derrill Memo ON 09/12/2018] zolpidem (AMBIEN) 10 MG tablet Take 1 tablet (10 mg total) by mouth at bedtime.    PHQ 2/9 Scores 08/01/2018 08/30/2017  PHQ - 2 Score 0 1    Physical Exam  Constitutional: She is oriented to person, place, and time. She appears well-developed and well-nourished.  HENT:  Right Ear: External ear and ear canal normal. Tympanic membrane is not erythematous and not retracted.  Left Ear: External ear and ear canal normal. Tympanic membrane is not erythematous and not retracted.  Nose: Right sinus exhibits maxillary sinus tenderness and frontal sinus tenderness. Left sinus exhibits maxillary sinus tenderness and frontal sinus tenderness.  Mouth/Throat: Uvula is midline and mucous membranes are normal. No oral lesions. Posterior oropharyngeal erythema present. No oropharyngeal exudate.  Neck: Normal range of motion. Neck supple.  Cardiovascular: Normal rate, regular rhythm and normal heart sounds.  Pulmonary/Chest: Breath sounds normal. She  has no wheezes. She has no rales.  Lymphadenopathy:    She has no cervical adenopathy.  Neurological: She is alert and oriented to person, place, and time.    BP 112/68 (BP Location: Right Arm, Patient Position: Sitting, Cuff Size: Normal)   Pulse (!) 59   Temp 97.8 F (36.6 C) (Oral)   Ht 5\' 6"  (1.676 m)   Wt 174 lb (78.9 kg)   SpO2 99%   BMI 28.08 kg/m   Assessment and Plan: 1. Acute non-recurrent maxillary  sinusitis Continue mucinex - doxycycline (VIBRA-TABS) 100 MG tablet; Take 1 tablet (100 mg total) by mouth 2 (two) times daily for 10 days.  Dispense: 20 tablet; Refill: 0 - guaiFENesin-codeine (ROBITUSSIN AC) 100-10 MG/5ML syrup; Take 5 mLs by mouth 3 (three) times daily as needed for cough.  Dispense: 118 mL; Refill: 0   Partially dictated using Editor, commissioning. Any errors are unintentional.  Halina Maidens, MD [facility]  09/09/2018

## 2018-09-10 ENCOUNTER — Encounter: Payer: Self-pay | Admitting: *Deleted

## 2018-09-10 ENCOUNTER — Ambulatory Visit: Payer: Self-pay | Attending: Oncology | Admitting: *Deleted

## 2018-09-10 ENCOUNTER — Other Ambulatory Visit: Payer: Self-pay

## 2018-09-10 VITALS — BP 110/71 | HR 75 | Temp 98.1°F | Ht 67.0 in | Wt 177.0 lb

## 2018-09-10 DIAGNOSIS — N63 Unspecified lump in unspecified breast: Secondary | ICD-10-CM

## 2018-09-10 NOTE — Progress Notes (Addendum)
  Subjective:     Patient ID: Tracy Chase, female   DOB: 02-10-1956, 62 y.o.   MRN: [id number]  HPI   Review of Systems     Objective:   Physical Exam  Pulmonary/Chest: Right breast exhibits no inverted nipple, no mass, no nipple discharge, no skin change and no tenderness. Left breast exhibits mass and tenderness. Left breast exhibits no inverted nipple, no nipple discharge and no skin change.         Assessment:     62 year old White female presents to [facility] for clinical breast exam and mammogram only.  Patient complains of targeted left breast pain at 1:00.  States "I'm just aware of the pain".  Denies any aggrevating or alleviating factors. On clinical breast exam bilateral upper outer quadrants have a fibroglandular pattern.  I can palpate an approximate 1 cm nodule that is tender at the site of targeted pain.  Taught self breast awareness. Patient has a history of renal cell carcinoma in 2012.  She was followed by Dr. Jacqlyn Larsen.   Patient also has a history of hysterectomy for heavy bleeding. Patient has been screened for eligibility.  She does not have any insurance, Medicare or Medicaid.  She also meets financial eligibility.  Hand-out given on the Affordable Care Act.  Risk Assessment    Risk Scores      09/10/2018   Last edited by: Theodore Demark, RN   5-year risk: 1.8 %   Lifetime risk: 7.2 %            Plan:     Will get bilateral diagnostic mammogram and ultrasound.  Jeanella Anton to schedule patient an appointment.  Will follow-up per [facility] protocol.

## 2018-09-10 NOTE — Patient Instructions (Signed)
Gave patient hand-out, Women Staying Healthy, Active and Well from [facility], with education on breast health, pap smears, heart and colon health. 

## 2018-09-18 ENCOUNTER — Other Ambulatory Visit: Payer: Disability Insurance

## 2018-09-23 ENCOUNTER — Ambulatory Visit
Admission: RE | Admit: 2018-09-23 | Discharge: 2018-09-23 | Disposition: A | Discharge status: Home / Self Care | Payer: Disability Insurance | Source: Ambulatory Visit | Attending: Oncology | Admitting: Oncology

## 2018-09-23 DIAGNOSIS — N63 Unspecified lump in unspecified breast: Secondary | ICD-10-CM

## 2018-10-13 ENCOUNTER — Telehealth: Payer: Self-pay

## 2018-10-13 ENCOUNTER — Other Ambulatory Visit: Payer: Self-pay | Admitting: Internal Medicine

## 2018-10-13 DIAGNOSIS — F5101 Primary insomnia: Secondary | ICD-10-CM

## 2018-10-13 NOTE — Telephone Encounter (Signed)
Pt was given a written Rx at her visit in September that she could fill now.  She needs to find that Rx and give it to the pharmacist.

## 2018-10-13 NOTE — Telephone Encounter (Signed)
Patient calling to request refill on  zolpidem (AMBIEN) 10 MG tablet.  Looks like it did not go to pharmacy as the system says "PRINT" please advise?

## 2018-10-14 NOTE — Telephone Encounter (Signed)
Called and left a VM informing patient of this. Told to call back if anymore questions.

## 2018-10-15 ENCOUNTER — Encounter: Payer: Self-pay | Admitting: *Deleted

## 2018-10-15 NOTE — Progress Notes (Signed)
Patient with normal mammogram and ultrasound.  Called patient to see if she could return for repeat breast exam of the palpable finding in the left breast.  No answer.  Left message.

## 2018-11-14 ENCOUNTER — Encounter: Payer: Self-pay | Admitting: *Deleted

## 2018-11-14 NOTE — Progress Notes (Signed)
Called patient and left her a message to return my call.  Patient returned call.  States the nodule we could feel on exam is gone.  States she "cut back on caffeine" and feels this may have caused the improvement.  Offered an opportunity to return for another breast exam, but she states I'm fine.  She is to call if the area returns.  Letter was created to send to patient.  Will try and delete the letter in [place].  HSIS to [place].

## 2019-02-03 ENCOUNTER — Other Ambulatory Visit: Payer: Self-pay

## 2019-02-03 ENCOUNTER — Telehealth: Payer: Self-pay | Admitting: Internal Medicine

## 2019-02-03 MED ORDER — FLUOXETINE HCL 20 MG PO CAPS: 20.0000 mg | ORAL_CAPSULE | Freq: Every day | ORAL | 0 refills | Status: DC

## 2019-02-03 NOTE — Telephone Encounter (Signed)
Patient needs refill for FLUoxetine (PROZAC) 20 MG capsule  She decided to cancel her appointment for this coming Friday.  [place] DEA #:  --

## 2019-02-03 NOTE — Telephone Encounter (Signed)
Sent in patients medication. Let her know she needs to schedule a 3 months follow up for BP with Dr B.

## 2019-02-06 ENCOUNTER — Ambulatory Visit: Payer: Self-pay | Admitting: Internal Medicine

## 2019-03-25 ENCOUNTER — Other Ambulatory Visit: Payer: Self-pay | Admitting: Internal Medicine

## 2019-03-25 DIAGNOSIS — I10 Essential (primary) hypertension: Secondary | ICD-10-CM

## 2019-04-17 ENCOUNTER — Other Ambulatory Visit: Payer: Self-pay

## 2019-04-17 ENCOUNTER — Encounter: Payer: Self-pay | Admitting: Internal Medicine

## 2019-04-17 ENCOUNTER — Ambulatory Visit (INDEPENDENT_AMBULATORY_CARE_PROVIDER_SITE_OTHER): Payer: Medicare Other | Admitting: Internal Medicine

## 2019-04-17 VITALS — BP 114/64 | HR 81 | Ht 67.0 in | Wt 186.0 lb

## 2019-04-17 DIAGNOSIS — F419 Anxiety disorder, unspecified: Secondary | ICD-10-CM | POA: Diagnosis not present

## 2019-04-17 DIAGNOSIS — Z1211 Encounter for screening for malignant neoplasm of colon: Secondary | ICD-10-CM | POA: Diagnosis not present

## 2019-04-17 DIAGNOSIS — I10 Essential (primary) hypertension: Secondary | ICD-10-CM

## 2019-04-17 DIAGNOSIS — B070 Plantar wart: Secondary | ICD-10-CM

## 2019-04-17 DIAGNOSIS — F5101 Primary insomnia: Secondary | ICD-10-CM

## 2019-04-17 MED ORDER — ZOLPIDEM TARTRATE 5 MG PO TABS: 5.0000 mg | ORAL_TABLET | Freq: Every day | ORAL | 1 refills | Status: DC

## 2019-04-17 MED ORDER — FLUOXETINE HCL 20 MG PO CAPS: 20.0000 mg | ORAL_CAPSULE | Freq: Every day | ORAL | 1 refills | Status: DC

## 2019-04-17 MED ORDER — TRIAMTERENE-HCTZ 37.5-25 MG PO TABS: 1.0000 | ORAL_TABLET | Freq: Every day | ORAL | 3 refills | Status: DC

## 2019-04-17 NOTE — Patient Instructions (Signed)
See Dermatology - try [facility] in [place]

## 2019-04-17 NOTE — Progress Notes (Signed)
Date:  04/17/2019   Name:  Tracy Chase   DOB:  [birth year]   MRN:  [id number]   Chief Complaint: Hypertension (follow up.); Anxiety; Insomnia; and Sore (Sore on left first finger. Been there for about 6 months and not going away on its own. painful.)  Hypertension  This is a chronic problem. The problem is controlled. Associated symptoms include anxiety and neck pain. Pertinent negatives include no chest pain, headaches, palpitations or shortness of breath. Past treatments include diuretics. The current treatment provides significant improvement.  Anxiety  Presents for follow-up visit. Symptoms include insomnia. Patient reports no chest pain, dizziness, nausea, palpitations or shortness of breath. Symptoms occur occasionally. The severity of symptoms is mild. The quality of sleep is good.   Compliance with medications is 76-100%.  Insomnia  Primary symptoms: sleep disturbance.  The current episode started more than one month. The problem occurs nightly. The problem is unchanged. Past treatments include medication Lorrin Mais). The treatment provided significant relief.  Skin lesion - on left index finger, hard, tender.  Tried compound W bandaids - removed just the top layer.  She has not been to a Dermatologist.   Review of Systems  Constitutional: Negative for chills, fatigue, fever and unexpected weight change.  HENT: Negative for trouble swallowing.   Eyes: Negative for visual disturbance.  Respiratory: Negative for chest tightness, shortness of breath and wheezing.   Cardiovascular: Negative for chest pain, palpitations and leg swelling.  Gastrointestinal: Negative for constipation, nausea and vomiting.  Musculoskeletal: Positive for neck pain. Negative for arthralgias.  Skin: Positive for rash.  Neurological: Negative for dizziness, light-headedness and headaches.  Hematological: Negative for adenopathy.  Psychiatric/Behavioral: Positive for sleep disturbance. The patient has  insomnia.     Patient Active Problem List   Diagnosis Date Noted  . Bone spur of foot 03/19/2018  . Degenerative disc disease, cervical 11/18/2017  . Anxiety 06/21/2015  . Bloodgood disease 06/21/2015  . History of renal cell cancer 06/21/2015  . Carpal tunnel syndrome 06/21/2015  . Essential (primary) hypertension 06/21/2015  . Idiopathic insomnia 06/21/2015    Allergies  Allergen Reactions  . Hydrocodone Itching    Past Surgical History:  Procedure Laterality Date  . APPENDECTOMY    . cryoablation for renal cell carcinoma  2012  . LUMBAR FUSION    . MEDIAL PARTIAL KNEE REPLACEMENT Left   . PARTIAL HYSTERECTOMY    . TONSILLECTOMY    . VENTRAL HERNIA REPAIR  2012   Dr. Genevive Bi    Social History   Tobacco Use  . Smoking status: Never Smoker  . Smokeless tobacco: Never Used  Substance Use Topics  . Alcohol use: Yes    Alcohol/week: 0.0 standard drinks    Comment: socially  . Drug use: No     Medication list has been reviewed and updated.  Current Meds  Medication Sig  . cyclobenzaprine (FLEXERIL) 10 MG tablet Take 1 tablet (10 mg total) by mouth 3 (three) times daily as needed for muscle spasms.  Marland Kitchen FLUoxetine (PROZAC) 20 MG capsule Take 1 capsule (20 mg total) by mouth daily.  . meloxicam (MOBIC) 7.5 MG tablet TAKE 1 TABLET BY MOUTH EVERY DAY  . triamterene-hydrochlorothiazide (MAXZIDE-25) 37.5-25 MG tablet TAKE 1 TABLET BY MOUTH EVERY DAY  . zolpidem (AMBIEN) 10 MG tablet Take 1 tablet (10 mg total) by mouth at bedtime.    PHQ 2/9 Scores 04/17/2019 08/01/2018 08/30/2017  PHQ - 2 Score 0 0 1    BP Readings  from Last 3 Encounters:  04/17/19 114/64  09/10/18 110/71  09/09/18 112/68    Physical Exam Vitals signs and nursing note reviewed.  Constitutional:      General: She is not in acute distress.    Appearance: She is well-developed.  HENT:     Head: Normocephalic and atraumatic.  Eyes:     Pupils: Pupils are equal, round, and reactive to light.   Neck:     Musculoskeletal: Normal range of motion and neck supple.  Cardiovascular:     Rate and Rhythm: Normal rate and regular rhythm.     Pulses: Normal pulses.     Heart sounds: No murmur.  Pulmonary:     Effort: Pulmonary effort is normal. No respiratory distress.     Breath sounds: No wheezing or rhonchi.  Musculoskeletal: Normal range of motion.     Right lower leg: Edema present.     Left lower leg: Edema present.  Lymphadenopathy:     Cervical: No cervical adenopathy.  Skin:    General: Skin is warm and dry.     Capillary Refill: Capillary refill takes less than 2 seconds.     Findings: No rash.     Comments: Wart like lesion on dorsum of left index finger Multiple keratotic like lesions scattered over entire skin surface  Neurological:     Mental Status: She is alert and oriented to person, place, and time.  Psychiatric:        Behavior: Behavior normal.        Thought Content: Thought content normal.     Wt Readings from Last 3 Encounters:  04/17/19 186 lb (84.4 kg)  09/10/18 177 lb (80.3 kg)  09/09/18 174 lb (78.9 kg)    BP 114/64   Pulse 81   Ht 5\' 7"  (1.702 m)   Wt 186 lb (84.4 kg)   SpO2 99%   BMI 29.13 kg/m   Assessment and Plan: 1. Essential (primary) hypertension controlled - triamterene-hydrochlorothiazide (MAXZIDE-25) 37.5-25 MG tablet; Take 1 tablet by mouth daily.  Dispense: 90 tablet; Refill: 3  2. Anxiety Doing well on Prozac - FLUoxetine (PROZAC) 20 MG capsule; Take 1 capsule (20 mg total) by mouth daily.  Dispense: 90 capsule; Refill: 1  3. Idiopathic insomnia Continue Ambien but lower dose is all that is allowed due to age - zolpidem (AMBIEN) 5 MG tablet; Take 1 tablet (5 mg total) by mouth at bedtime.  Dispense: 90 tablet; Refill: 1  4. Plantar wart Recommend Dermatology For general skin survey as well  5. Colon cancer screening - Ambulatory referral to Gastroenterology   Partially dictated using Dragon software. Any errors  are unintentional.  Halina Maidens, MD [facility]  04/17/2019

## 2019-04-20 ENCOUNTER — Telehealth: Payer: Self-pay | Admitting: Gastroenterology

## 2019-04-20 NOTE — Telephone Encounter (Signed)
Patient returned  Call to schedule a colonoscopy.

## 2019-04-21 ENCOUNTER — Other Ambulatory Visit: Payer: Self-pay

## 2019-04-21 DIAGNOSIS — Z1211 Encounter for screening for malignant neoplasm of colon: Secondary | ICD-10-CM

## 2019-04-21 NOTE — Telephone Encounter (Signed)
Gastroenterology Pre-Procedure Review  Request Date: 05/07/19 Requesting Physician: Dr. Bonna Gains  PATIENT REVIEW QUESTIONS: The patient responded to the following health history questions as indicated:    1. Are you having any GI issues? no 2. Do you have a personal history of Polyps? no 3. Do you have a family history of Colon Cancer or Polyps? no 4. Diabetes Mellitus? no 5. Joint replacements in the past 12 months?no 6. Major health problems in the past 3 months?no 7. Any artificial heart valves, MVP, or defibrillator?no    MEDICATIONS & ALLERGIES:    Patient reports the following regarding taking any anticoagulation/antiplatelet therapy:   Plavix, Coumadin, Eliquis, Xarelto, Lovenox, Pradaxa, Brilinta, or Effient? no Aspirin? no  Patient confirms/reports the following medications:  Current Outpatient Medications  Medication Sig Dispense Refill  . cyclobenzaprine (FLEXERIL) 10 MG tablet Take 1 tablet (10 mg total) by mouth 3 (three) times daily as needed for muscle spasms. 90 tablet 0  . FLUoxetine (PROZAC) 20 MG capsule Take 1 capsule (20 mg total) by mouth daily. 90 capsule 1  . meloxicam (MOBIC) 7.5 MG tablet TAKE 1 TABLET BY MOUTH EVERY DAY 90 tablet 0  . triamterene-hydrochlorothiazide (MAXZIDE-25) 37.5-25 MG tablet Take 1 tablet by mouth daily. 90 tablet 3  . zolpidem (AMBIEN) 5 MG tablet Take 1 tablet (5 mg total) by mouth at bedtime. 90 tablet 1   No current facility-administered medications for this visit.     Patient confirms/reports the following allergies:  Allergies  Allergen Reactions  . Hydrocodone Itching    No orders of the defined types were placed in this encounter.   AUTHORIZATION INFORMATION Primary Insurance: 1D#: Group #:  Secondary Insurance: 1D#: Group #:  SCHEDULE INFORMATION: Date: 05/07/19 Time: Location:[facility]

## 2019-05-04 ENCOUNTER — Telehealth: Payer: Self-pay | Admitting: Gastroenterology

## 2019-05-04 ENCOUNTER — Other Ambulatory Visit: Admission: RE | Admit: 2019-05-04 | Payer: Medicare Other | Source: Ambulatory Visit

## 2019-05-04 NOTE — Telephone Encounter (Signed)
Patiet called & wants to cx procedure on 05-07-19 & r/s in the fall.

## 2019-05-04 NOTE — Telephone Encounter (Signed)
Patient has to cancel her procedure on Thurs 6/25 due to family emergency. She will r/s for Sept or Oct.

## 2019-05-04 NOTE — Telephone Encounter (Signed)
Returned patients call.  Colonoscopy has been rescheduled from 05/07/19 to 08/28/19 still with Dr. Bonna Gains at [facility].  COVID test date is now 08/25/19.  Kieth Brightly in Endoscopy has been informed.  Thanks [employer]

## 2019-05-08 ENCOUNTER — Ambulatory Visit: Payer: Self-pay | Admitting: Internal Medicine

## 2019-06-04 DIAGNOSIS — C44622 Squamous cell carcinoma of skin of right upper limb, including shoulder: Secondary | ICD-10-CM | POA: Diagnosis not present

## 2019-06-04 DIAGNOSIS — D485 Neoplasm of uncertain behavior of skin: Secondary | ICD-10-CM | POA: Diagnosis not present

## 2019-06-04 DIAGNOSIS — L570 Actinic keratosis: Secondary | ICD-10-CM | POA: Diagnosis not present

## 2019-07-02 DIAGNOSIS — C44622 Squamous cell carcinoma of skin of right upper limb, including shoulder: Secondary | ICD-10-CM | POA: Diagnosis not present

## 2019-08-03 DIAGNOSIS — C44629 Squamous cell carcinoma of skin of left upper limb, including shoulder: Secondary | ICD-10-CM | POA: Diagnosis not present

## 2019-08-25 ENCOUNTER — Other Ambulatory Visit
Admission: RE | Admit: 2019-08-25 | Discharge: 2019-08-25 | Disposition: A | Discharge status: Home / Self Care | Payer: Medicare Other | Source: Ambulatory Visit | Attending: Gastroenterology | Admitting: Gastroenterology

## 2019-08-25 ENCOUNTER — Other Ambulatory Visit: Payer: Self-pay

## 2019-08-25 DIAGNOSIS — Z20828 Contact with and (suspected) exposure to other viral communicable diseases: Secondary | ICD-10-CM | POA: Insufficient documentation

## 2019-08-25 DIAGNOSIS — Z01812 Encounter for preprocedural laboratory examination: Secondary | ICD-10-CM | POA: Insufficient documentation

## 2019-08-25 MED ORDER — SUPREP BOWEL PREP KIT 17.5-3.13-1.6 GM/177ML PO SOLN: 1.0000 | ORAL | 0 refills | Status: DC

## 2019-08-26 LAB — SARS CORONAVIRUS 2 (TAT 6-24 HRS): SARS Coronavirus 2: NEGATIVE

## 2019-08-27 ENCOUNTER — Encounter: Payer: Self-pay | Admitting: *Deleted

## 2019-08-28 ENCOUNTER — Ambulatory Visit: Payer: Medicare Other | Admitting: Certified Registered"

## 2019-08-28 ENCOUNTER — Ambulatory Visit
Admission: RE | Admit: 2019-08-28 | Discharge: 2019-08-28 | Disposition: A | Discharge status: Home / Self Care | Payer: Medicare Other | Attending: Gastroenterology | Admitting: Gastroenterology

## 2019-08-28 ENCOUNTER — Other Ambulatory Visit: Payer: Self-pay

## 2019-08-28 ENCOUNTER — Encounter: Payer: Self-pay | Admitting: Anesthesiology

## 2019-08-28 ENCOUNTER — Encounter
Admission: RE | Disposition: A | Discharge status: Home / Self Care | Payer: Self-pay | Source: Home / Self Care | Attending: Gastroenterology

## 2019-08-28 DIAGNOSIS — Z885 Allergy status to narcotic agent status: Secondary | ICD-10-CM | POA: Diagnosis not present

## 2019-08-28 DIAGNOSIS — D123 Benign neoplasm of transverse colon: Secondary | ICD-10-CM | POA: Diagnosis not present

## 2019-08-28 DIAGNOSIS — D124 Benign neoplasm of descending colon: Secondary | ICD-10-CM | POA: Insufficient documentation

## 2019-08-28 DIAGNOSIS — I10 Essential (primary) hypertension: Secondary | ICD-10-CM | POA: Insufficient documentation

## 2019-08-28 DIAGNOSIS — Z79899 Other long term (current) drug therapy: Secondary | ICD-10-CM | POA: Insufficient documentation

## 2019-08-28 DIAGNOSIS — Z981 Arthrodesis status: Secondary | ICD-10-CM | POA: Diagnosis not present

## 2019-08-28 DIAGNOSIS — K5730 Diverticulosis of large intestine without perforation or abscess without bleeding: Secondary | ICD-10-CM | POA: Insufficient documentation

## 2019-08-28 DIAGNOSIS — K5790 Diverticulosis of intestine, part unspecified, without perforation or abscess without bleeding: Secondary | ICD-10-CM | POA: Diagnosis not present

## 2019-08-28 DIAGNOSIS — F419 Anxiety disorder, unspecified: Secondary | ICD-10-CM | POA: Diagnosis not present

## 2019-08-28 DIAGNOSIS — Z8249 Family history of ischemic heart disease and other diseases of the circulatory system: Secondary | ICD-10-CM | POA: Diagnosis not present

## 2019-08-28 DIAGNOSIS — Z96652 Presence of left artificial knee joint: Secondary | ICD-10-CM | POA: Diagnosis not present

## 2019-08-28 DIAGNOSIS — Z85528 Personal history of other malignant neoplasm of kidney: Secondary | ICD-10-CM | POA: Insufficient documentation

## 2019-08-28 DIAGNOSIS — Z90710 Acquired absence of both cervix and uterus: Secondary | ICD-10-CM | POA: Diagnosis not present

## 2019-08-28 DIAGNOSIS — K635 Polyp of colon: Secondary | ICD-10-CM

## 2019-08-28 DIAGNOSIS — Z841 Family history of disorders of kidney and ureter: Secondary | ICD-10-CM | POA: Diagnosis not present

## 2019-08-28 DIAGNOSIS — Z791 Long term (current) use of non-steroidal anti-inflammatories (NSAID): Secondary | ICD-10-CM | POA: Insufficient documentation

## 2019-08-28 DIAGNOSIS — Z1211 Encounter for screening for malignant neoplasm of colon: Secondary | ICD-10-CM

## 2019-08-28 HISTORY — DX: Malignant (primary) neoplasm, unspecified: C80.1

## 2019-08-28 HISTORY — PX: COLONOSCOPY WITH PROPOFOL: SHX5780

## 2019-08-28 SURGERY — COLONOSCOPY WITH PROPOFOL
Anesthesia: General

## 2019-08-28 MED ORDER — LIDOCAINE HCL (CARDIAC) PF 100 MG/5ML IV SOSY
PREFILLED_SYRINGE | INTRAVENOUS | Status: DC | PRN
  Administered 2019-08-28: 100 mg via INTRATRACHEAL

## 2019-08-28 MED ORDER — PROPOFOL 500 MG/50ML IV EMUL
INTRAVENOUS | Status: DC | PRN
  Administered 2019-08-28: 140 ug/kg/min via INTRAVENOUS

## 2019-08-28 MED ORDER — GLYCOPYRROLATE 0.2 MG/ML IJ SOLN
INTRAMUSCULAR | Status: DC | PRN
  Administered 2019-08-28: 0.2 mg via INTRAVENOUS

## 2019-08-28 MED ORDER — PROPOFOL 10 MG/ML IV BOLUS
INTRAVENOUS | Status: AC
  Filled 2019-08-28: qty 80

## 2019-08-28 MED ORDER — SODIUM CHLORIDE 0.9 % IV SOLN
INTRAVENOUS | Status: DC
  Administered 2019-08-28: 1000 mL via INTRAVENOUS
  Administered 2019-08-28: 08:00:00 via INTRAVENOUS

## 2019-08-28 MED ORDER — PROPOFOL 500 MG/50ML IV EMUL
INTRAVENOUS | Status: AC
  Filled 2019-08-28: qty 350

## 2019-08-28 MED ORDER — PROPOFOL 10 MG/ML IV BOLUS
INTRAVENOUS | Status: DC | PRN
  Administered 2019-08-28: 40 mg via INTRAVENOUS
  Administered 2019-08-28: 10 mg via INTRAVENOUS

## 2019-08-28 NOTE — H&P (Signed)
Tracy Antigua, MD [address] [place], [place], [place], [address] [address], [place], [place], [place], [address] Phone: [phone]  Fax: [phone]  Primary Care Physician:  Tracy Hess, MD   Pre-Procedure History & Physical: HPI:  Tracy Chase is a 63 y.o. female is here for a colonoscopy.   Past Medical History:  Diagnosis Date  . Cancer [facility])    RENAL CELL    Past Surgical History:  Procedure Laterality Date  . ABDOMINAL HYSTERECTOMY    . APPENDECTOMY    . BACK SURGERY    . cryoablation for renal cell carcinoma  2012  . JOINT REPLACEMENT    . LUMBAR FUSION    . MEDIAL PARTIAL KNEE REPLACEMENT Left   . PARTIAL HYSTERECTOMY    . TONSILLECTOMY    . VENTRAL HERNIA REPAIR  2012   Dr. Genevive Chase    Prior to Admission medications   Medication Sig Start Date End Date Taking? Authorizing Provider  cyclobenzaprine (FLEXERIL) 10 MG tablet Take 1 tablet (10 mg total) by mouth 3 (three) times daily as needed for muscle spasms. 11/18/17  Yes Tracy Hess, MD  FLUoxetine (PROZAC) 20 MG capsule Take 1 capsule (20 mg total) by mouth daily. 04/17/19  Yes Tracy Hess, MD  meloxicam (MOBIC) 7.5 MG tablet TAKE 1 TABLET BY MOUTH EVERY DAY 06/20/18  Yes Tracy Hess, MD  Na Sulfate-K Sulfate-Mg Sulf (SUPREP BOWEL PREP KIT) 17.5-3.13-1.6 GM/177ML SOLN Take 1 kit by mouth as directed. 08/25/19  Yes Virgel Manifold, MD  triamterene-hydrochlorothiazide (MAXZIDE-25) 37.5-25 MG tablet Take 1 tablet by mouth daily. 04/17/19  Yes Tracy Hess, MD  zolpidem (AMBIEN) 5 MG tablet Take 1 tablet (5 mg total) by mouth at bedtime. 04/17/19 07/16/19  Tracy Hess, MD    Allergies as of 04/21/2019 - Review Complete 04/17/2019  Allergen Reaction Noted  . Hydrocodone Itching 08/16/2015    Family History  Problem Relation Age of Onset  . Kidney failure Mother   . Congestive Heart Failure Father   . Breast cancer Neg Hx     Social History   Socioeconomic  History  . Marital status: Single    Spouse name: Not on file  . Number of children: Not on file  . Years of education: Not on file  . Highest education level: Not on file  Occupational History  . Not on file  Social Needs  . Financial resource strain: Not on file  . Food insecurity    Worry: Not on file    Inability: Not on file  . Transportation needs    Medical: Not on file    Non-medical: Not on file  Tobacco Use  . Smoking status: Never Smoker  . Smokeless tobacco: Never Used  Substance and Sexual Activity  . Alcohol use: Yes    Alcohol/week: 0.0 standard drinks    Comment: socially  . Drug use: No  . Sexual activity: Not on file  Lifestyle  . Physical activity    Days per week: Not on file    Minutes per session: Not on file  . Stress: Not on file  Relationships  . Social Herbalist on phone: Not on file    Gets together: Not on file    Attends religious service: Not on file    Active member of club or organization: Not on file    Attends meetings of clubs or organizations: Not on file    Relationship  status: Not on file  . Intimate partner violence    Fear of current or ex partner: Not on file    Emotionally abused: Not on file    Physically abused: Not on file    Forced sexual activity: Not on file  Other Topics Concern  . Not on file  Social History Narrative  . Not on file    Review of Systems: See HPI, otherwise negative ROS  Physical Exam: BP 116/82   Pulse 78   Temp (!) 96.7 F (35.9 C) (Tympanic)   Resp 20   Ht 5' 7"  (1.702 m)   Wt 83.9 kg   SpO2 100%   BMI 28.98 kg/m  General:   Alert,  pleasant and cooperative in NAD Head:  Normocephalic and atraumatic. Neck:  Supple; no masses or thyromegaly. Lungs:  Clear throughout to auscultation, normal respiratory effort.    Heart:  +S1, +S2, Regular rate and rhythm, No edema. Abdomen:  Soft, nontender and nondistended. Normal bowel sounds, without guarding, and without rebound.    Neurologic:  Alert and  oriented x4;  grossly normal neurologically.  Impression/Plan: Tracy Chase is here for a colonoscopy to be performed for average risk screening.  Risks, benefits, limitations, and alternatives regarding  colonoscopy have been reviewed with the patient.  Questions have been answered.  All parties agreeable.   Virgel Manifold, MD  08/28/2019, 8:23 AM

## 2019-08-28 NOTE — Transfer of Care (Signed)
Immediate Anesthesia Transfer of Care Note  Patient: Tracy Chase  Procedure(s) Performed: COLONOSCOPY WITH PROPOFOL (N/A )  Patient Location: Endoscopy Unit  Anesthesia Type:General  Level of Consciousness: awake, alert , oriented and patient cooperative  Airway & Oxygen Therapy: Patient Spontanous Breathing  Post-op Assessment: Report given to RN and Post -op Vital signs reviewed and stable  Post vital signs: Reviewed and stable  Last Vitals:  Vitals Value Taken Time  BP 100/67 08/28/19 0904  Temp    Pulse 40 08/28/19 0906  Resp 12 08/28/19 0907  SpO2 86 % 08/28/19 0906  Vitals shown include unvalidated device data.  Last Pain:  Vitals:   08/28/19 0716  TempSrc: Tympanic  PainSc: 0-No pain         Complications: No apparent anesthesia complications

## 2019-08-28 NOTE — Op Note (Signed)
[facility]astroenterology Patient Name: Tracy Chase Procedure Date: 08/28/2019 8:18 AM MRN: [id number] Account #: [id number] Date of Birth: [birth year] Admit Type: Outpatient Age: 63 Room: [facility] ENDO ROOM 3 Gender: Female Note Status: Finalized Procedure:            Colonoscopy Indications:          Screening for colorectal malignant neoplasm Providers:            Varnita B. Bonna Gains MD, MD Referring MD:         Forest Gleason Md, MD (Referring MD) Medicines:            Monitored Anesthesia Care Complications:        No immediate complications. Procedure:            Pre-Anesthesia Assessment:                       - ASA Grade Assessment: II - A patient with mild                        systemic disease.                       - Prior to the procedure, a History and Physical was                        performed, and patient medications, allergies and                        sensitivities were reviewed. The patient's tolerance of                        previous anesthesia was reviewed.                       - The risks and benefits of the procedure and the                        sedation options and risks were discussed with the                        patient. All questions were answered and informed                        consent was obtained.                       - Patient identification and proposed procedure were                        verified prior to the procedure by the physician, the                        nurse, the anesthesiologist, the anesthetist and the                        technician. The procedure was verified in the procedure                        room.  After obtaining informed consent, the colonoscope was                        passed under direct vision. Throughout the procedure,                        the patient's blood pressure, pulse, and oxygen                        saturations were monitored continuously. The               Colonoscope was introduced through the anus and                        advanced to the the cecum, identified by appendiceal                        orifice and ileocecal valve. The colonoscopy was                        performed with ease. The patient tolerated the                        procedure well. The quality of the bowel preparation                        was good. Findings:      The perianal and digital rectal examinations were normal.      Two sessile polyps were found in the descending colon and transverse       colon. The polyps were 2 to 3 mm in size. These polyps were removed with       a cold biopsy forceps. Resection and retrieval were complete.      Multiple diverticula were found in the sigmoid colon.      The exam was otherwise without abnormality.      The rectum, sigmoid colon, descending colon, transverse colon, ascending       colon and cecum appeared normal.      The retroflexed view of the distal rectum and anal verge was normal and       showed no anal or rectal abnormalities. Impression:           - Two 2 to 3 mm polyps in the descending colon and in                        the transverse colon, removed with a cold biopsy                        forceps. Resected and retrieved.                       - Diverticulosis in the sigmoid colon.                       - The examination was otherwise normal.                       - The rectum, sigmoid colon, descending colon,                        transverse colon,  ascending colon and cecum are normal.                       - The distal rectum and anal verge are normal on                        retroflexion view. Recommendation:       - Discharge patient to home (with escort).                       - High fiber diet.                       - Advance diet as tolerated.                       - Continue present medications.                       - Await pathology results.                       - Repeat  colonoscopy date to be determined after                        pending pathology results are reviewed.                       - The findings and recommendations were discussed with                        the patient.                       - The findings and recommendations were discussed with                        the patient's family.                       - Return to primary care physician as previously                        scheduled. Procedure Code(s):    --- Professional ---                       [phone], Colonoscopy, flexible; with biopsy, single or                        multiple Diagnosis Code(s):    --- Professional ---                       Z12.11, Encounter for screening for malignant neoplasm                        of colon                       K63.5, Polyp of colon                       K57.30, Diverticulosis of large intestine without  perforation or abscess without bleeding CPT copyright 2019 American Medical Association. All rights reserved. The codes documented in this report are preliminary and upon coder review may  be revised to meet current compliance requirements.  Vonda Antigua, MD Margretta Sidle B. Bonna Gains MD, MD 08/28/2019 9:19:33 AM This report has been signed electronically. Number of Addenda: 0 Note Initiated On: 08/28/2019 8:18 AM Scope Withdrawal Time: 0 hours 22 minutes 32 seconds  Total Procedure Duration: 0 hours 31 minutes 7 seconds  Estimated Blood Loss: Estimated blood loss: none.      [facility]

## 2019-08-28 NOTE — Anesthesia Postprocedure Evaluation (Signed)
Anesthesia Post Note  Patient: Tracy Chase  Procedure(s) Performed: COLONOSCOPY WITH PROPOFOL (N/A )  Patient location during evaluation: Endoscopy Anesthesia Type: General Level of consciousness: awake and alert Pain management: pain level controlled Vital Signs Assessment: post-procedure vital signs reviewed and stable Respiratory status: spontaneous breathing, nonlabored ventilation, respiratory function stable and patient connected to nasal cannula oxygen Cardiovascular status: blood pressure returned to baseline and stable Postop Assessment: no apparent nausea or vomiting Anesthetic complications: no     Last Vitals:  Vitals:   08/28/19 0929 08/28/19 0939  BP: 128/81 112/71  Pulse:    Resp:    Temp:    SpO2:      Last Pain:  Vitals:   08/28/19 0939  TempSrc:   PainSc: 0-No pain                 Martha Clan

## 2019-08-28 NOTE — Anesthesia Post-op Follow-up Note (Signed)
Anesthesia QCDR form completed.        

## 2019-08-28 NOTE — Anesthesia Preprocedure Evaluation (Signed)
Anesthesia Evaluation  Patient identified by MRN, date of birth, ID band Patient awake    Reviewed: Allergy & Precautions, H&P , NPO status , Patient's Chart, lab work & pertinent test results, reviewed documented beta blocker date and time   History of Anesthesia Complications Negative for: history of anesthetic complications  Airway Mallampati: III  TM Distance: >3 FB Neck ROM: full    Dental  (+) Dental Advidsory Given, Caps   Pulmonary neg pulmonary ROS,    Pulmonary exam normal        Cardiovascular Exercise Tolerance: Good hypertension, (-) angina(-) Past MI and (-) Cardiac Stents Normal cardiovascular exam(-) dysrhythmias (-) Valvular Problems/Murmurs     Neuro/Psych PSYCHIATRIC DISORDERS Anxiety negative neurological ROS     GI/Hepatic negative GI ROS, Neg liver ROS,   Endo/Other  negative endocrine ROS  Renal/GU Renal disease (Renal cell cancer s/p removal)  negative genitourinary   Musculoskeletal   Abdominal   Peds  Hematology negative hematology ROS (+)   Anesthesia Other Findings Past Medical History: No date: Cancer ([place])     Comment:  RENAL CELL   Reproductive/Obstetrics negative OB ROS                             Anesthesia Physical Anesthesia Plan  ASA: II  Anesthesia Plan: General   Post-op Pain Management:    Induction: Intravenous  PONV Risk Score and Plan: 3 and Propofol infusion and TIVA  Airway Management Planned: Natural Airway and Nasal Cannula  Additional Equipment:   Intra-op Plan:   Post-operative Plan:   Informed Consent: I have reviewed the patients History and Physical, chart, labs and discussed the procedure including the risks, benefits and alternatives for the proposed anesthesia with the patient or authorized representative who has indicated his/her understanding and acceptance.     Dental Advisory Given  Plan Discussed with:  Anesthesiologist, CRNA and Surgeon  Anesthesia Plan Comments:         Anesthesia Quick Evaluation

## 2019-08-31 ENCOUNTER — Encounter: Payer: Self-pay | Admitting: Gastroenterology

## 2019-08-31 LAB — SURGICAL PATHOLOGY

## 2019-09-08 ENCOUNTER — Encounter: Payer: Self-pay | Admitting: Gastroenterology

## 2019-09-11 ENCOUNTER — Telehealth: Payer: Self-pay

## 2019-09-11 ENCOUNTER — Ambulatory Visit
Admission: EM | Admit: 2019-09-11 | Discharge: 2019-09-11 | Disposition: A | Discharge status: Home / Self Care | Payer: Medicare Other | Attending: Family Medicine | Admitting: Family Medicine

## 2019-09-11 ENCOUNTER — Other Ambulatory Visit: Payer: Self-pay

## 2019-09-11 ENCOUNTER — Encounter: Payer: Self-pay | Admitting: Emergency Medicine

## 2019-09-11 DIAGNOSIS — R102 Pelvic and perineal pain: Secondary | ICD-10-CM | POA: Diagnosis not present

## 2019-09-11 LAB — URINALYSIS, COMPLETE (UACMP) WITH MICROSCOPIC
Bacteria, UA: NONE SEEN
Bilirubin Urine: NEGATIVE
Glucose, UA: NEGATIVE mg/dL
Hgb urine dipstick: NEGATIVE
Ketones, ur: NEGATIVE mg/dL
Leukocytes,Ua: NEGATIVE
Nitrite: NEGATIVE
Protein, ur: NEGATIVE mg/dL
Specific Gravity, Urine: 1.015 (ref 1.005–1.030)
pH: 7 (ref 5.0–8.0)

## 2019-09-11 MED ORDER — NAPROXEN 500 MG PO TABS: 500.0000 mg | ORAL_TABLET | Freq: Two times a day (BID) | ORAL | 0 refills | Status: DC | PRN

## 2019-09-11 NOTE — Discharge Instructions (Signed)
No evidence of UTI.  Medication as needed for pain.  Follow up with PCP.  Take care  Dr. Lacinda Axon

## 2019-09-11 NOTE — Telephone Encounter (Signed)
Called patient after she left VM regarding pressure in vaginal region. I advised on her VM to go to OBGYN today and if she does not have one, go to UC/ED. I advise her not to wait until Monday and instructed her to be seen ASAP to r/o UTI. Also advised to contact GI since she did just have colonoscopy.

## 2019-09-11 NOTE — ED Triage Notes (Signed)
Patient here today c/o lower abd pain, freq,with pressure x1wk has been out of town at [employer]. Came back a couple of days ago Patient states when she urinates pain is subsided a little OTC: none

## 2019-09-11 NOTE — ED Provider Notes (Signed)
[facility]    CSN: [id number] Arrival date & time: 09/11/19  1531      History   Chief Complaint Concern for UTI  HPI  63 year old female presents with concern for UTI.  Patient reports a 1 week history of suprapubic pressure.  Denies dysuria.  States that she urinates frequently but this is her baseline.  No reports of urinary urgency.  No fever.  No back pain.  No flank pain.  No medications or interventions tried.  No known inciting factor.  No known exacerbating factors.  No other complaints.  PMH, Surgical Hx, Family Hx, Social History reviewed and updated as below.  PMH: Patient Active Problem List   Diagnosis Date Noted  . Encounter for screening colonoscopy   . Polyp of colon   . Bone spur of foot 03/19/2018  . Degenerative disc disease, cervical 11/18/2017  . Anxiety 06/21/2015  . Bloodgood disease 06/21/2015  . History of renal cell cancer 06/21/2015  . Carpal tunnel syndrome 06/21/2015  . Essential (primary) hypertension 06/21/2015  . Idiopathic insomnia 06/21/2015   Past Surgical History:  Procedure Laterality Date  . ABDOMINAL HYSTERECTOMY    . APPENDECTOMY    . BACK SURGERY    . COLONOSCOPY WITH PROPOFOL N/A 08/28/2019   Procedure: COLONOSCOPY WITH PROPOFOL;  Surgeon: Virgel Manifold, MD;  Location: [facility] ENDOSCOPY;  Service: Endoscopy;  Laterality: N/A;  . cryoablation for renal cell carcinoma  2012  . JOINT REPLACEMENT    . LUMBAR FUSION    . MEDIAL PARTIAL KNEE REPLACEMENT Left   . PARTIAL HYSTERECTOMY    . TONSILLECTOMY    . VENTRAL HERNIA REPAIR  2012   Dr. Genevive Bi    OB History   No obstetric history on file.      Home Medications    Prior to Admission medications   Medication Sig Start Date End Date Taking? Authorizing Provider  cyclobenzaprine (FLEXERIL) 10 MG tablet Take 1 tablet (10 mg total) by mouth 3 (three) times daily as needed for muscle spasms. 11/18/17  Yes Glean Hess, MD  FLUoxetine (PROZAC) 20 MG  capsule Take 1 capsule (20 mg total) by mouth daily. 04/17/19  Yes Glean Hess, MD  triamterene-hydrochlorothiazide (MAXZIDE-25) 37.5-25 MG tablet Take 1 tablet by mouth daily. 04/17/19  Yes Glean Hess, MD  naproxen (NAPROSYN) 500 MG tablet Take 1 tablet (500 mg total) by mouth 2 (two) times daily as needed for moderate pain. 09/11/19   Coral Spikes, DO  zolpidem (AMBIEN) 5 MG tablet Take 1 tablet (5 mg total) by mouth at bedtime. 04/17/19 07/16/19  Glean Hess, MD    Family History Family History  Problem Relation Age of Onset  . Kidney failure Mother   . Congestive Heart Failure Father   . Breast cancer Neg Hx     Social History Social History   Tobacco Use  . Smoking status: Never Smoker  . Smokeless tobacco: Never Used  Substance Use Topics  . Alcohol use: Yes    Alcohol/week: 0.0 standard drinks    Comment: socially  . Drug use: No     Allergies   Hydrocodone   Review of Systems Review of Systems  Constitutional: Negative for fever.  Genitourinary: Negative for dysuria.       Suprapubic pressure.   Physical Exam Triage Vital Signs ED Triage Vitals  Enc Vitals Group     BP 09/11/19 1551 (!) 111/58     Pulse Rate 09/11/19 1551 66  Resp --      Temp 09/11/19 1559 98.5 F (36.9 C)     Temp Source 09/11/19 1559 Oral     SpO2 09/11/19 1551 100 %     Weight 09/11/19 1546 190 lb (86.2 kg)     Height --      Head Circumference --      Peak Flow --      Pain Score 09/11/19 1546 7     Pain Loc --      Pain Edu? --      Excl. in [place]? --    Updated Vital Signs BP (!) 111/58   Pulse 66   Temp 98.5 F (36.9 C) (Oral)   Wt 86.2 kg   SpO2 100%   BMI 29.76 kg/m   Visual Acuity Right Eye Distance:   Left Eye Distance:   Bilateral Distance:    Right Eye Near:   Left Eye Near:    Bilateral Near:     Physical Exam Vitals signs and nursing note reviewed.  Constitutional:      General: She is not in acute distress.    Appearance: Normal  appearance. She is not ill-appearing.  HENT:     Head: Normocephalic and atraumatic.  Eyes:     General:        Right eye: No discharge.        Left eye: No discharge.     Conjunctiva/sclera: Conjunctivae normal.  Cardiovascular:     Rate and Rhythm: Normal rate and regular rhythm.  Pulmonary:     Effort: Pulmonary effort is normal.     Breath sounds: Normal breath sounds. No wheezing, rhonchi or rales.  Abdominal:     General: There is no distension.     Palpations: Abdomen is soft.     Comments: No significant tenderness on exam.  Neurological:     Mental Status: She is alert.  Psychiatric:        Mood and Affect: Mood normal.        Behavior: Behavior normal.    UC Treatments / Results  Labs (all labs ordered are listed, but only abnormal results are displayed) Labs Reviewed  URINALYSIS, COMPLETE (UACMP) WITH MICROSCOPIC - Abnormal; Notable for the following components:      Result Value   Color, Urine STRAW (*)    All other components within normal limits    EKG   Radiology No results found.  Procedures Procedures (including critical care time)  Medications Ordered in UC Medications - No data to display  Initial Impression / Assessment and Plan / UC Course  I have reviewed the triage vital signs and the nursing notes.  Pertinent labs & imaging results that were available during my care of the patient were reviewed by me and considered in my medical decision making (see chart for details).    63 year old female presents with suprapubic pressure.  No significant tenderness on exam.  Urinalysis negative.  Naproxen as needed for pain.  Advised to follow-up with primary care.  If she worsens or fails to improve, may need further evaluation and imaging.  Final Clinical Impressions(s) / UC Diagnoses   Final diagnoses:  Suprapubic pain     Discharge Instructions     No evidence of UTI.  Medication as needed for pain.  Follow up with PCP.  Take care   Dr. Lacinda Axon     ED Prescriptions    Medication Sig Dispense Auth. Provider   naproxen (NAPROSYN) 500 MG  tablet Take 1 tablet (500 mg total) by mouth 2 (two) times daily as needed for moderate pain. 10 tablet Coral Spikes, DO     PDMP not reviewed this encounter.   Coral Spikes, [place] 09/11/19 1651

## 2019-09-14 ENCOUNTER — Other Ambulatory Visit: Payer: Self-pay | Admitting: Internal Medicine

## 2019-09-14 DIAGNOSIS — F419 Anxiety disorder, unspecified: Secondary | ICD-10-CM

## 2019-09-23 ENCOUNTER — Ambulatory Visit (INDEPENDENT_AMBULATORY_CARE_PROVIDER_SITE_OTHER): Payer: Medicare Other | Admitting: Internal Medicine

## 2019-09-23 ENCOUNTER — Encounter: Payer: Self-pay | Admitting: Internal Medicine

## 2019-09-23 ENCOUNTER — Other Ambulatory Visit: Payer: Self-pay

## 2019-09-23 VITALS — BP 118/82 | HR 64 | Ht 67.0 in | Wt 197.0 lb

## 2019-09-23 DIAGNOSIS — Z1231 Encounter for screening mammogram for malignant neoplasm of breast: Secondary | ICD-10-CM

## 2019-09-23 DIAGNOSIS — I10 Essential (primary) hypertension: Secondary | ICD-10-CM

## 2019-09-23 DIAGNOSIS — Z13820 Encounter for screening for osteoporosis: Secondary | ICD-10-CM

## 2019-09-23 DIAGNOSIS — Z0000 Encounter for general adult medical examination without abnormal findings: Secondary | ICD-10-CM

## 2019-09-23 DIAGNOSIS — R0789 Other chest pain: Secondary | ICD-10-CM | POA: Diagnosis not present

## 2019-09-23 DIAGNOSIS — F5101 Primary insomnia: Secondary | ICD-10-CM | POA: Diagnosis not present

## 2019-09-23 DIAGNOSIS — K219 Gastro-esophageal reflux disease without esophagitis: Secondary | ICD-10-CM | POA: Insufficient documentation

## 2019-09-23 DIAGNOSIS — Z23 Encounter for immunization: Secondary | ICD-10-CM

## 2019-09-23 LAB — POCT URINALYSIS DIPSTICK
Bilirubin, UA: NEGATIVE
Blood, UA: NEGATIVE
Glucose, UA: NEGATIVE
Ketones, UA: NEGATIVE
Leukocytes, UA: NEGATIVE
Nitrite, UA: NEGATIVE
Protein, UA: NEGATIVE
Spec Grav, UA: 1.015 (ref 1.010–1.025)
Urobilinogen, UA: 0.2 E.U./dL
pH, UA: 6 (ref 5.0–8.0)

## 2019-09-23 MED ORDER — OMEPRAZOLE 40 MG PO CPDR: 40.0000 mg | DELAYED_RELEASE_CAPSULE | Freq: Every day | ORAL | 1 refills | Status: DC

## 2019-09-23 MED ORDER — ZOLPIDEM TARTRATE 10 MG PO TABS: 10.0000 mg | ORAL_TABLET | Freq: Every day | ORAL | 1 refills | Status: DC

## 2019-09-23 MED ORDER — SHINGRIX 50 MCG/0.5ML IM SUSR: 0.5000 mL | Freq: Once | INTRAMUSCULAR | 1 refills | Status: AC

## 2019-09-23 NOTE — Progress Notes (Signed)
Date:  09/23/2019   Name:  Tracy Chase   DOB:  12/04/1955   MRN:  [id number]   Chief Complaint: Annual Exam (Breast Exam. PHQ9- 5, GAD7-7 ) Tracy Chase See is a 63 y.o. female who presents today for her Complete Annual Exam. She feels fairly well. She reports exercising none. She reports she is sleeping fairly well.   Mammogram 10/2018 Colonoscopy 08/2019 DEXA - none Immunizations - Prevnar 13 due  Hypertension This is a chronic problem. The problem is controlled. Associated symptoms include anxiety. Pertinent negatives include no chest pain, headaches, palpitations or shortness of breath. Past treatments include diuretics. The current treatment provides significant improvement.  Insomnia Primary symptoms: no sleep disturbance.  The problem occurs nightly. Past treatments include medication. The treatment provided significant relief.  Anxiety Presents for follow-up visit. Symptoms include insomnia. Patient reports no chest pain, dizziness, nausea, nervous/anxious behavior, palpitations or shortness of breath. Symptoms occur occasionally. The quality of sleep is good.   Compliance with medications is 76-100% (prozac).  Chest Pain  The current episode started more than 1 month ago. The problem occurs intermittently. The problem has been unchanged. The pain is present in the substernal region. The pain is mild. The quality of the pain is described as dull. The pain does not radiate. Pertinent negatives include no abdominal pain, cough, dizziness, exertional chest pressure, fever, headaches, irregular heartbeat, nausea, palpitations, shortness of breath or vomiting. The pain is aggravated by nothing. She has tried nothing for the symptoms.  Her past medical history is significant for hypertension.    Lab Results  Component Value Date   CREATININE 0.94 03/19/2018   BUN 12 03/19/2018   NA 143 03/19/2018   K 3.7 03/19/2018   CL 99 03/19/2018   CO2 27 03/19/2018   Lab Results   Component Value Date   CHOL 233 (H) 01/18/2016   HDL 107 01/18/2016   LDLCALC 108 (H) 01/18/2016   TRIG 92 01/18/2016   CHOLHDL 2.2 01/18/2016   Lab Results  Component Value Date   TSH 2.060 01/18/2016   No results found for: HGBA1C   Review of Systems  Constitutional: Negative for chills, fatigue and fever.  HENT: Negative for congestion, hearing loss, tinnitus, trouble swallowing and voice change.   Eyes: Negative for visual disturbance.  Respiratory: Negative for cough, chest tightness, shortness of breath and wheezing.   Cardiovascular: Negative for chest pain, palpitations and leg swelling.  Gastrointestinal: Negative for abdominal pain, constipation, diarrhea, nausea and vomiting.       Recurrent GERD  Endocrine: Negative for polydipsia and polyuria.  Genitourinary: Negative for dysuria, frequency, genital sores, vaginal bleeding and vaginal discharge.  Musculoskeletal: Negative for arthralgias, gait problem and joint swelling.  Skin: Negative for color change and rash.  Neurological: Negative for dizziness, tremors, light-headedness and headaches.  Hematological: Negative for adenopathy. Does not bruise/bleed easily.  Psychiatric/Behavioral: Negative for dysphoric mood and sleep disturbance. The patient has insomnia. The patient is not nervous/anxious.     Patient Active Problem List   Diagnosis Date Noted  . Encounter for screening colonoscopy   . Polyp of colon   . Bone spur of foot 03/19/2018  . Degenerative disc disease, cervical 11/18/2017  . Anxiety 06/21/2015  . History of renal cell cancer 06/21/2015  . Carpal tunnel syndrome 06/21/2015  . Essential (primary) hypertension 06/21/2015  . Idiopathic insomnia 06/21/2015    Allergies  Allergen Reactions  . Hydrocodone Itching    Past Surgical History:  Procedure Laterality Date  . ABDOMINAL HYSTERECTOMY    . APPENDECTOMY    . BACK SURGERY    . COLONOSCOPY WITH PROPOFOL N/A 08/28/2019   Procedure:  COLONOSCOPY WITH PROPOFOL;  Surgeon: Virgel Manifold, MD;  Location: [facility] END[facility]COPY;  Service: Endoscopy;  Laterality: N/A;  . cryoablation for renal cell carcinoma  2012  . JOINT REPLACEMENT    . LUMBAR FUSION    . MEDIAL PARTIAL KNEE REPLACEMENT Left   . PARTIAL HYSTERECTOMY    . TONSILLECTOMY    . VENTRAL HERNIA REPAIR  2012   Dr. Genevive Bi    Social History   Tobacco Use  . Smoking status: Never Smoker  . Smokeless tobacco: Never Used  Substance Use Topics  . Alcohol use: Yes    Alcohol/week: 0.0 standard drinks    Comment: socially  . Drug use: No     Medication list has been reviewed and updated.  Current Meds  Medication Sig  . cyclobenzaprine (FLEXERIL) 10 MG tablet Take 1 tablet (10 mg total) by mouth 3 (three) times daily as needed for muscle spasms.  Marland Kitchen FLUoxetine (PROZAC) 20 MG capsule TAKE 1 CAPSULE BY MOUTH  DAILY  . HYDROcodone-acetaminophen (NORCO/VICODIN) 5-325 MG tablet TAKE 1 2 TABLETS BY MOUTH EVERY 6 8 HOURS IF NEEDED FOR PAIN.  . naproxen (NAPROSYN) 500 MG tablet Take 1 tablet (500 mg total) by mouth 2 (two) times daily as needed for moderate pain.  Marland Kitchen triamterene-hydrochlorothiazide (MAXZIDE-25) 37.5-25 MG tablet Take 1 tablet by mouth daily.  Marland Kitchen zolpidem (AMBIEN) 5 MG tablet Take 1 tablet (5 mg total) by mouth at bedtime.    PHQ 2/9 Scores 09/23/2019 04/17/2019 08/01/2018 08/30/2017  PHQ - 2 Score 0 0 0 1  PHQ- 9 Score 5 - - -   GAD 7 : Generalized Anxiety Score 09/23/2019  Nervous, Anxious, on Edge 0  Control/stop worrying 0  Worry too much - different things 2  Trouble relaxing 3  Restless 2  Easily annoyed or irritable 0  Afraid - awful might happen 0  Total GAD 7 Score 7  Anxiety Difficulty Somewhat difficult      BP Readings from Last 3 Encounters:  09/23/19 118/82  09/11/19 (!) 111/58  08/28/19 112/71    Physical Exam Vitals signs and nursing note reviewed.  Constitutional:      General: She is not in acute distress.     Appearance: She is well-developed.  HENT:     Head: Normocephalic and atraumatic.     Right Ear: Tympanic membrane and ear canal normal.     Left Ear: Tympanic membrane and ear canal normal.     Nose:     Right Sinus: No maxillary sinus tenderness.     Left Sinus: No maxillary sinus tenderness.  Eyes:     General: No scleral icterus.       Right eye: No discharge.        Left eye: No discharge.     Conjunctiva/sclera: Conjunctivae normal.  Neck:     Musculoskeletal: Normal range of motion. No erythema.     Thyroid: No thyromegaly.     Vascular: No carotid bruit.  Cardiovascular:     Rate and Rhythm: Normal rate and regular rhythm.     Pulses: Normal pulses.     Heart sounds: Normal heart sounds.  Pulmonary:     Effort: Pulmonary effort is normal. No respiratory distress.     Breath sounds: No wheezing.  Chest:  Breasts:        Right: No mass, nipple discharge, skin change or tenderness.        Left: No mass, nipple discharge, skin change or tenderness.  Abdominal:     General: Bowel sounds are normal. There is no distension.     Palpations: Abdomen is soft. There is no mass.     Tenderness: There is no abdominal tenderness. There is no guarding or rebound.  Musculoskeletal: Normal range of motion.  Lymphadenopathy:     Cervical: No cervical adenopathy.  Skin:    General: Skin is warm and dry.     Capillary Refill: Capillary refill takes less than 2 seconds.     Findings: No rash.  Neurological:     General: No focal deficit present.     Mental Status: She is alert and oriented to person, place, and time.     Cranial Nerves: No cranial nerve deficit.     Sensory: No sensory deficit.     Deep Tendon Reflexes: Reflexes are normal and symmetric.  Psychiatric:        Attention and Perception: Attention normal.        Mood and Affect: Mood normal.        Speech: Speech normal.        Behavior: Behavior normal.        Thought Content: Thought content normal.     Wt  Readings from Last 3 Encounters:  09/23/19 197 lb (89.4 kg)  09/11/19 190 lb (86.2 kg)  08/28/19 185 lb (83.9 kg)    BP 118/82   Pulse 64   Ht 5\' 7"  (1.702 m)   Wt 197 lb (89.4 kg)   SpO2 97%   BMI 30.85 kg/m   Assessment and Plan: 1. Annual physical exam Normal exam except for weight Encouraged improved diet and regular exercise - Lipid panel - TSH - POCT urinalysis dipstick  2. Encounter for screening mammogram for breast cancer To be scheduled at [place]; Future  3. Encounter for screening for osteoporosis - DG Bone Density; Future  4. Essential (primary) hypertension Clinically stable exam with well controlled BP.   Tolerating medications, hctz 25 mg, without side effects at this time. Pt to continue current regimen and low sodium diet; benefits of regular exercise as able discussed. - CBC with Differential/Platelet - Comprehensive metabolic panel  5. Idiopathic insomnia Not controlled on 5 mg ambien.  Will increase dose to 10 mg. - zolpidem (AMBIEN) 10 MG tablet; Take 1 tablet (10 mg total) by mouth at bedtime.  Dispense: 90 tablet; Refill: 1  6. Need for vaccination for pneumococcus - Pneumococcal conjugate vaccine 13-valent IM  7. Atypical chest pain Suspect GERD, will start omeprazole - EKG 12-Lead - SB @ 54, WNL - omeprazole (PRILOSEC) 40 MG capsule; Take 1 capsule (40 mg total) by mouth daily.  Dispense: 90 capsule; Refill: 1  8. Need for shingles vaccine Pt to get this at her local pharmacy - Zoster Vaccine Adjuvanted [facility]) injection; Inject 0.5 mLs into the muscle once for 1 dose.  Dispense: 0.5 mL; Refill: 1   Partially dictated using Editor, commissioning. Any errors are unintentional.  Halina Maidens, MD [facility]  09/23/2019

## 2019-09-23 NOTE — Patient Instructions (Signed)
Take Omeprazole daily for a month.  If chest discomfort resolves, you can try stopping omeprazole.  If it returns, you may need to take omeprazole on a regular basis.

## 2019-09-24 LAB — CBC WITH DIFFERENTIAL/PLATELET
Basophils Absolute: 0.1 10*3/uL (ref 0.0–0.2)
Basos: 1 %
EOS (ABSOLUTE): 0.1 10*3/uL (ref 0.0–0.4)
Eos: 2 %
Hematocrit: 41.1 % (ref 34.0–46.6)
Hemoglobin: 13.7 g/dL (ref 11.1–15.9)
Immature Grans (Abs): 0 10*3/uL (ref 0.0–0.1)
Immature Granulocytes: 0 %
Lymphocytes Absolute: 2.7 10*3/uL (ref 0.7–3.1)
Lymphs: 48 %
MCH: 30.2 pg (ref 26.6–33.0)
MCHC: 33.3 g/dL (ref 31.5–35.7)
MCV: 91 fL (ref 79–97)
Monocytes Absolute: 0.4 10*3/uL (ref 0.1–0.9)
Monocytes: 7 %
Neutrophils Absolute: 2.4 10*3/uL (ref 1.4–7.0)
Neutrophils: 42 %
Platelets: 266 10*3/uL (ref 150–450)
RBC: 4.54 x10E6/uL (ref 3.77–5.28)
RDW: 11.4 % — ABNORMAL LOW (ref 11.7–15.4)
WBC: 5.7 10*3/uL (ref 3.4–10.8)

## 2019-09-24 LAB — COMPREHENSIVE METABOLIC PANEL
ALT: 12 IU/L (ref 0–32)
AST: 18 IU/L (ref 0–40)
Albumin/Globulin Ratio: 1.8 (ref 1.2–2.2)
Albumin: 4.2 g/dL (ref 3.8–4.8)
Alkaline Phosphatase: 77 IU/L (ref 39–117)
BUN/Creatinine Ratio: 11 — ABNORMAL LOW (ref 12–28)
BUN: 9 mg/dL (ref 8–27)
Bilirubin Total: 0.4 mg/dL (ref 0.0–1.2)
CO2: 25 mmol/L (ref 20–29)
Calcium: 9.8 mg/dL (ref 8.7–10.3)
Chloride: 97 mmol/L (ref 96–106)
Creatinine, Ser: 0.82 mg/dL (ref 0.57–1.00)
GFR calc Af Amer: 87 mL/min/{1.73_m2} (ref >59)
GFR calc non Af Amer: 75 mL/min/{1.73_m2} (ref >59)
Globulin, Total: 2.3 g/dL (ref 1.5–4.5)
Glucose: 106 mg/dL — ABNORMAL HIGH (ref 65–99)
Potassium: 3.6 mmol/L (ref 3.5–5.2)
Sodium: 138 mmol/L (ref 134–144)
Total Protein: 6.5 g/dL (ref 6.0–8.5)

## 2019-09-24 LAB — LIPID PANEL
Chol/HDL Ratio: 2.7 ratio (ref 0.0–4.4)
Cholesterol, Total: 247 mg/dL — ABNORMAL HIGH (ref 100–199)
HDL: 93 mg/dL (ref >39)
LDL Chol Calc (NIH): 135 mg/dL — ABNORMAL HIGH (ref 0–99)
Triglycerides: 111 mg/dL (ref 0–149)
VLDL Cholesterol Cal: 19 mg/dL (ref 5–40)

## 2019-09-24 LAB — TSH: TSH: 4.22 u[IU]/mL (ref 0.450–4.500)

## 2019-09-25 ENCOUNTER — Encounter: Payer: Self-pay | Admitting: Internal Medicine

## 2019-09-25 DIAGNOSIS — E782 Mixed hyperlipidemia: Secondary | ICD-10-CM | POA: Insufficient documentation

## 2019-10-07 ENCOUNTER — Ambulatory Visit
Admission: RE | Admit: 2019-10-07 | Discharge: 2019-10-07 | Disposition: A | Discharge status: Home / Self Care | Payer: Medicare Other | Source: Ambulatory Visit | Attending: Internal Medicine | Admitting: Internal Medicine

## 2019-10-07 DIAGNOSIS — Z780 Asymptomatic menopausal state: Secondary | ICD-10-CM | POA: Insufficient documentation

## 2019-10-07 DIAGNOSIS — Z13820 Encounter for screening for osteoporosis: Secondary | ICD-10-CM | POA: Diagnosis not present

## 2019-10-07 DIAGNOSIS — Z1231 Encounter for screening mammogram for malignant neoplasm of breast: Secondary | ICD-10-CM | POA: Insufficient documentation

## 2019-12-22 ENCOUNTER — Telehealth: Payer: Self-pay

## 2019-12-22 NOTE — Telephone Encounter (Signed)
Pt scheduled for an appt tomorrow

## 2019-12-22 NOTE — Telephone Encounter (Signed)
Her colonoscopy in 08/2019 did not reveal any hemorrhoids.  Therefore I am reluctant to give advice so she may need to be seen.

## 2019-12-22 NOTE — Telephone Encounter (Signed)
Patient called saying she has been experiencing pain with hemorrhoids and very low back pain.   What's advice on what you recommend for this?

## 2019-12-23 ENCOUNTER — Other Ambulatory Visit: Payer: Self-pay | Admitting: Internal Medicine

## 2019-12-23 ENCOUNTER — Other Ambulatory Visit: Payer: Self-pay

## 2019-12-23 ENCOUNTER — Encounter: Payer: Self-pay | Admitting: Internal Medicine

## 2019-12-23 ENCOUNTER — Ambulatory Visit (INDEPENDENT_AMBULATORY_CARE_PROVIDER_SITE_OTHER): Payer: Medicare Other | Admitting: Internal Medicine

## 2019-12-23 VITALS — BP 144/92 | HR 88 | Ht 67.0 in | Wt 194.0 lb

## 2019-12-23 DIAGNOSIS — K648 Other hemorrhoids: Secondary | ICD-10-CM

## 2019-12-23 DIAGNOSIS — K5901 Slow transit constipation: Secondary | ICD-10-CM | POA: Diagnosis not present

## 2019-12-23 DIAGNOSIS — M545 Low back pain, unspecified: Secondary | ICD-10-CM

## 2019-12-23 DIAGNOSIS — M5030 Other cervical disc degeneration, unspecified cervical region: Secondary | ICD-10-CM

## 2019-12-23 MED ORDER — CYCLOBENZAPRINE HCL 10 MG PO TABS: 10.0000 mg | ORAL_TABLET | Freq: Three times a day (TID) | ORAL | 0 refills | Status: DC | PRN

## 2019-12-23 MED ORDER — PREDNISONE 10 MG PO TABS: ORAL_TABLET | ORAL | 0 refills | Status: AC

## 2019-12-23 MED ORDER — HYDROCORTISONE (PERIANAL) 2.5 % EX CREA: 1.0000 "application " | TOPICAL_CREAM | Freq: Two times a day (BID) | CUTANEOUS | 0 refills | Status: DC

## 2019-12-23 NOTE — Patient Instructions (Addendum)
Covid Vaccine locations: [employer] [facility] [place]  [email or web address]   Start Miralax daily - can take up to twice a day if needed

## 2019-12-23 NOTE — Progress Notes (Signed)
Date:  12/23/2019   Name:  Tracy Chase   DOB:  October 08, 1956   MRN:  [id number]   Chief Complaint: Back Pain (Very low back pain. Patient believe she may be having hemorroid issues. Having "things protruding" from her rectum. Back pain is severe X 1 week. Also only having BMs once weekly. Taking metamucil and not helping.  )  Constipation This is a new problem. The current episode started 1 to 4 weeks ago. The problem is unchanged. Her stool frequency is 1 time per week or less. The stool is described as blood coated. Associated symptoms include back pain and rectal pain. Pertinent negatives include no fever, nausea or vomiting.  Back Pain This is a recurrent problem. The problem occurs daily. The problem has been gradually worsening since onset. The pain is present in the lumbar spine. The quality of the pain is described as cramping and shooting. The pain does not radiate. The pain is moderate. The symptoms are aggravated by sitting. Pertinent negatives include no chest pain, dysuria, fever or headaches. Risk factors: history of lumbar fusion. She has tried muscle relaxant and heat for the symptoms. The treatment provided no relief.   Rectal mass - over the past week has rectal pain and lumps with a bowel movement.  Sometimes sees scant red blood on the tissue.    Lab Results  Component Value Date   CREATININE 0.82 09/23/2019   BUN 9 09/23/2019   NA 138 09/23/2019   K 3.6 09/23/2019   CL 97 09/23/2019   CO2 25 09/23/2019   Lab Results  Component Value Date   CHOL 247 (H) 09/23/2019   HDL 93 09/23/2019   LDLCALC 135 (H) 09/23/2019   TRIG 111 09/23/2019   CHOLHDL 2.7 09/23/2019   Lab Results  Component Value Date   TSH 4.220 09/23/2019   No results found for: HGBA1C   Review of Systems  Constitutional: Negative for chills, fatigue and fever.  Respiratory: Negative for cough, chest tightness, shortness of breath and wheezing.   Cardiovascular: Negative for chest pain,  palpitations and leg swelling.  Gastrointestinal: Positive for anal bleeding, constipation and rectal pain. Negative for nausea and vomiting.  Genitourinary: Negative for dysuria.  Musculoskeletal: Positive for back pain and myalgias.  Neurological: Negative for dizziness, light-headedness and headaches.    Patient Active Problem List   Diagnosis Date Noted  . Mixed hyperlipidemia 09/25/2019  . Gastroesophageal reflux disease 09/23/2019  . Encounter for screening colonoscopy   . Polyp of colon   . Bone spur of foot 03/19/2018  . Degenerative disc disease, cervical 11/18/2017  . Anxiety 06/21/2015  . History of renal cell cancer 06/21/2015  . Carpal tunnel syndrome 06/21/2015  . Essential (primary) hypertension 06/21/2015  . Idiopathic insomnia 06/21/2015    Allergies  Allergen Reactions  . Hydrocodone Itching    Past Surgical History:  Procedure Laterality Date  . ABDOMINAL HYSTERECTOMY    . APPENDECTOMY    . BACK SURGERY    . COLONOSCOPY WITH PROPOFOL N/A 08/28/2019   Procedure: COLONOSCOPY WITH PROPOFOL;  Surgeon: Virgel Manifold, MD;  Location: [facility] ENDOSCOPY;  Service: Endoscopy;  Laterality: N/A;  . cryoablation for renal cell carcinoma  2012  . JOINT REPLACEMENT    . LUMBAR FUSION    . MEDIAL PARTIAL KNEE REPLACEMENT Left   . PARTIAL HYSTERECTOMY    . TONSILLECTOMY    . VENTRAL HERNIA REPAIR  2012   Dr. Genevive Bi    Social History  Tobacco Use  . Smoking status: Never Smoker  . Smokeless tobacco: Never Used  Substance Use Topics  . Alcohol use: Yes    Alcohol/week: 0.0 standard drinks    Comment: socially  . Drug use: No     Medication list has been reviewed and updated.  Current Meds  Medication Sig  . cyclobenzaprine (FLEXERIL) 10 MG tablet Take 1 tablet (10 mg total) by mouth 3 (three) times daily as needed for muscle spasms.  Marland Kitchen FLUoxetine (PROZAC) 20 MG capsule TAKE 1 CAPSULE BY MOUTH  DAILY  . triamterene-hydrochlorothiazide (MAXZIDE-25)  37.5-25 MG tablet Take 1 tablet by mouth daily.  Marland Kitchen zolpidem (AMBIEN) 10 MG tablet Take 1 tablet (10 mg total) by mouth at bedtime.    PHQ 2/9 Scores 12/23/2019 09/23/2019 04/17/2019 08/01/2018  PHQ - 2 Score 0 0 0 0  PHQ- 9 Score - 5 - -    BP Readings from Last 3 Encounters:  12/23/19 (!) 144/92  09/23/19 118/82  09/11/19 (!) 111/58    Physical Exam Constitutional:      General: She is in acute distress.  Cardiovascular:     Rate and Rhythm: Normal rate and regular rhythm.     Pulses: Normal pulses.     Heart sounds: No murmur.  Pulmonary:     Effort: Pulmonary effort is normal.     Breath sounds: No wheezing or rhonchi.  Genitourinary:    Rectum: Tenderness, anal fissure and external hemorrhoid present.     Comments: Small hemorrhoid at 3 oclock Fissure at 9 oclock Musculoskeletal:     Cervical back: Normal range of motion.     Lumbar back: Tenderness (along the right lower paraspinus region) present. Decreased range of motion.  Neurological:     Mental Status: She is alert.     Wt Readings from Last 3 Encounters:  12/23/19 194 lb (88 kg)  09/23/19 197 lb (89.4 kg)  09/11/19 190 lb (86.2 kg)    BP (!) 144/92   Pulse 88   Ht 5\' 7"  (1.702 m)   Wt 194 lb (88 kg)   SpO2 100%   BMI 30.38 kg/m   Assessment and Plan: 1. Slow transit constipation Begin Miralax powder - one -two doses daily  2. Bleeding internal hemorrhoids Likely worsened by recent constipation - hydrocortisone (ANUSOL-HC) 2.5 % rectal cream; Place 1 application rectally 2 (two) times daily.  Dispense: 30 g; Refill: 0  3. Lumbar back pain S/p fusion, now with some muscle spasm and pain not responsive to Flexeril and heat Will add steroid taper and recommend Ortho follow up - predniSONE (DELTASONE) 10 MG tablet; Take 6 on day 1and 2, 5 on day 3 and 4, 4 on day 5 and 6 , 3 on day 7 and 8, 2 on day 9 and 10 and 1 on day 11 and 12 then stop.  Dispense: 42 tablet; Refill: 0 - Ambulatory referral to  Orthopedic Surgery   Partially dictated using Dragon software. Any errors are unintentional.  Halina Maidens, MD [facility]  12/23/2019

## 2019-12-24 ENCOUNTER — Ambulatory Visit: Payer: Self-pay | Admitting: Internal Medicine

## 2019-12-25 DIAGNOSIS — M545 Low back pain: Secondary | ICD-10-CM | POA: Diagnosis not present

## 2020-03-10 ENCOUNTER — Ambulatory Visit (INDEPENDENT_AMBULATORY_CARE_PROVIDER_SITE_OTHER): Payer: Medicare Other | Admitting: Internal Medicine

## 2020-03-10 ENCOUNTER — Other Ambulatory Visit: Payer: Self-pay

## 2020-03-10 ENCOUNTER — Encounter: Payer: Self-pay | Admitting: Internal Medicine

## 2020-03-10 VITALS — BP 126/70 | HR 71 | Temp 97.5°F | Ht 67.0 in | Wt 203.0 lb

## 2020-03-10 DIAGNOSIS — I10 Essential (primary) hypertension: Secondary | ICD-10-CM

## 2020-03-10 DIAGNOSIS — R200 Anesthesia of skin: Secondary | ICD-10-CM

## 2020-03-10 DIAGNOSIS — F419 Anxiety disorder, unspecified: Secondary | ICD-10-CM

## 2020-03-10 DIAGNOSIS — R202 Paresthesia of skin: Secondary | ICD-10-CM | POA: Diagnosis not present

## 2020-03-10 MED ORDER — TRIAMTERENE-HCTZ 37.5-25 MG PO TABS: 1.0000 | ORAL_TABLET | Freq: Every day | ORAL | 3 refills | Status: DC

## 2020-03-10 MED ORDER — FLUOXETINE HCL 10 MG PO CAPS: 10.0000 mg | ORAL_CAPSULE | Freq: Every day | ORAL | 1 refills | Status: DC

## 2020-03-10 MED ORDER — BACLOFEN 10 MG PO TABS: 10.0000 mg | ORAL_TABLET | Freq: Three times a day (TID) | ORAL | 1 refills | Status: DC

## 2020-03-10 NOTE — Progress Notes (Signed)
Date:  03/10/2020   Name:  Tracy Chase   DOB:  1955-11-19   MRN:  [id number]   Chief Complaint: Hypertension (6 month follow up. ) and Anxiety (17- GAD7. Some days feeling on edge and angry. )  Hypertension This is a chronic problem. The problem is controlled. Associated symptoms include anxiety. Pertinent negatives include no chest pain, headaches, palpitations or shortness of breath. Past treatments include diuretics. The current treatment provides significant improvement. There are no compliance problems.   Anxiety Presents for follow-up visit. Symptoms include irritability. Patient reports no chest pain, nervous/anxious behavior, palpitations or shortness of breath. Symptoms occur occasionally.    Depression        This is a chronic problem.The problem is unchanged.  Associated symptoms include no fatigue, no appetite change and no headaches.  Past treatments include SSRIs - Selective serotonin reuptake inhibitors.  Compliance with treatment is good.  Past medical history includes anxiety.    Muscle spasm in back and neck - having some intermittent tingling into her left arm - worse during the night.  Flexeril was too strong - felt hungover.  Recently tried baclofen and tolerated it.  Lab Results  Component Value Date   CREATININE 0.82 09/23/2019   BUN 9 09/23/2019   NA 138 09/23/2019   K 3.6 09/23/2019   CL 97 09/23/2019   CO2 25 09/23/2019   Lab Results  Component Value Date   CHOL 247 (H) 09/23/2019   HDL 93 09/23/2019   LDLCALC 135 (H) 09/23/2019   TRIG 111 09/23/2019   CHOLHDL 2.7 09/23/2019   Lab Results  Component Value Date   TSH 4.220 09/23/2019   No results found for: HGBA1C Lab Results  Component Value Date   WBC 5.7 09/23/2019   HGB 13.7 09/23/2019   HCT 41.1 09/23/2019   MCV 91 09/23/2019   PLT 266 09/23/2019   Lab Results  Component Value Date   ALT 12 09/23/2019   AST 18 09/23/2019   ALKPHOS 77 09/23/2019   BILITOT 0.4 09/23/2019      Review of Systems  Constitutional: Positive for irritability. Negative for appetite change, fatigue, fever and unexpected weight change.  HENT: Negative for tinnitus and trouble swallowing.   Eyes: Negative for visual disturbance.  Respiratory: Negative for cough, chest tightness and shortness of breath.   Cardiovascular: Negative for chest pain, palpitations and leg swelling (minor, unchanged).  Gastrointestinal: Negative for abdominal pain.  Genitourinary: Negative for dysuria and hematuria.  Musculoskeletal: Negative for arthralgias.  Neurological: Positive for numbness (intermittent in left arm). Negative for tremors and headaches.  Psychiatric/Behavioral: Positive for depression, dysphoric mood (gets very angry at times - episodes last several days.) and sleep disturbance. The patient is not nervous/anxious.     Patient Active Problem List   Diagnosis Date Noted  . Mixed hyperlipidemia 09/25/2019  . Gastroesophageal reflux disease 09/23/2019  . Encounter for screening colonoscopy   . Polyp of colon   . Bone spur of foot 03/19/2018  . Degenerative disc disease, cervical 11/18/2017  . Anxiety 06/21/2015  . History of renal cell cancer 06/21/2015  . Carpal tunnel syndrome 06/21/2015  . Essential (primary) hypertension 06/21/2015  . Idiopathic insomnia 06/21/2015    Allergies  Allergen Reactions  . Hydrocodone Itching    Past Surgical History:  Procedure Laterality Date  . ABDOMINAL HYSTERECTOMY    . APPENDECTOMY    . BACK SURGERY    . COLONOSCOPY WITH PROPOFOL N/A 08/28/2019   Procedure:  COLONOSCOPY WITH PROPOFOL;  Surgeon: Virgel Manifold, MD;  Location: [facility] ENDOSCOPY;  Service: Endoscopy;  Laterality: N/A;  . cryoablation for renal cell carcinoma  2012  . JOINT REPLACEMENT    . LUMBAR FUSION    . MEDIAL PARTIAL KNEE REPLACEMENT Left   . PARTIAL HYSTERECTOMY    . TONSILLECTOMY    . VENTRAL HERNIA REPAIR  2012   Dr. Genevive Bi    Social History   Tobacco  Use  . Smoking status: Never Smoker  . Smokeless tobacco: Never Used  Substance Use Topics  . Alcohol use: Yes    Alcohol/week: 0.0 standard drinks    Comment: socially  . Drug use: No     Medication list has been reviewed and updated.  Current Meds  Medication Sig  . FLUoxetine (PROZAC) 20 MG capsule TAKE 1 CAPSULE BY MOUTH  DAILY  . hydrocortisone (ANUSOL-HC) 2.5 % rectal cream Place 1 application rectally 2 (two) times daily.  Marland Kitchen triamterene-hydrochlorothiazide (MAXZIDE-25) 37.5-25 MG tablet Take 1 tablet by mouth daily.  Marland Kitchen zolpidem (AMBIEN) 10 MG tablet Take 1 tablet (10 mg total) by mouth at bedtime.  . [DISCONTINUED] cyclobenzaprine (FLEXERIL) 10 MG tablet Take 1 tablet (10 mg total) by mouth 3 (three) times daily as needed for muscle spasms.  . [DISCONTINUED] triamterene-hydrochlorothiazide (MAXZIDE-25) 37.5-25 MG tablet Take 1 tablet by mouth daily.    PHQ 2/9 Scores 03/10/2020 12/23/2019 09/23/2019 04/17/2019  PHQ - 2 Score 0 0 0 0  PHQ- 9 Score 0 - 5 -    BP Readings from Last 3 Encounters:  03/10/20 126/70  12/23/19 (!) 144/92  09/23/19 118/82    Physical Exam Vitals and nursing note reviewed.  Constitutional:      General: She is not in acute distress.    Appearance: Normal appearance. She is well-developed.  HENT:     Head: Normocephalic and atraumatic.  Cardiovascular:     Rate and Rhythm: Normal rate and regular rhythm.     Pulses: Normal pulses.  Pulmonary:     Effort: Pulmonary effort is normal. No respiratory distress.  Musculoskeletal:     Cervical back: Spasms present. No tenderness. Decreased range of motion.     Right lower leg: No edema.     Left lower leg: No edema.     Comments: Varicose veins in legs  Lymphadenopathy:     Cervical: No cervical adenopathy.  Skin:    General: Skin is warm and dry.     Findings: No rash.  Neurological:     Mental Status: She is alert and oriented to person, place, and time.  Psychiatric:        Attention  and Perception: Attention normal.        Mood and Affect: Mood normal.        Behavior: Behavior normal.        Thought Content: Thought content normal.     Wt Readings from Last 3 Encounters:  03/10/20 203 lb (92.1 kg)  12/23/19 194 lb (88 kg)  09/23/19 197 lb (89.4 kg)    BP 126/70   Pulse 71   Temp (!) 97.5 F (36.4 C) (Temporal)   Ht 5\' 7"  (1.702 m)   Wt 203 lb (92.1 kg)   SpO2 98%   BMI 31.79 kg/m   Assessment and Plan: 1. Essential (primary) hypertension Clinically stable exam with well controlled BP on diuretics. Tolerating medications without side effects at this time. Pt to continue current regimen and low sodium diet;  benefits of regular exercise as able discussed. - triamterene-hydrochlorothiazide (MAXZIDE-25) 37.5-25 MG tablet; Take 1 tablet by mouth daily.  Dispense: 90 tablet; Refill: 3  2. Anxiety With some anger/irritability - will try reducing the dose of Prozac to 10 mg per day - FLUoxetine (PROZAC) 10 MG capsule; Take 1 capsule (10 mg total) by mouth daily.  Dispense: 90 capsule; Refill: 1  3. Numbness and tingling in left arm Due to muscle spasm of the shoulder/neck Take baclofen regularly, use heat and gentle stretching or massage - baclofen (LIORESAL) 10 MG tablet; Take 1 tablet (10 mg total) by mouth 3 (three) times daily.  Dispense: 90 each; Refill: 1   Partially dictated using Editor, commissioning. Any errors are unintentional.  Halina Maidens, MD [facility]  03/10/2020

## 2020-03-11 ENCOUNTER — Ambulatory Visit: Payer: Medicare Other | Admitting: Internal Medicine

## 2020-03-18 ENCOUNTER — Ambulatory Visit: Payer: Medicare Other | Admitting: Internal Medicine

## 2020-04-13 DIAGNOSIS — M1712 Unilateral primary osteoarthritis, left knee: Secondary | ICD-10-CM | POA: Diagnosis not present

## 2020-04-14 DIAGNOSIS — Z124 Encounter for screening for malignant neoplasm of cervix: Secondary | ICD-10-CM | POA: Diagnosis not present

## 2020-04-14 DIAGNOSIS — Z1231 Encounter for screening mammogram for malignant neoplasm of breast: Secondary | ICD-10-CM | POA: Diagnosis not present

## 2020-04-18 ENCOUNTER — Other Ambulatory Visit: Payer: Self-pay | Admitting: Internal Medicine

## 2020-04-18 ENCOUNTER — Telehealth: Payer: Self-pay | Admitting: Internal Medicine

## 2020-04-18 ENCOUNTER — Ambulatory Visit (INDEPENDENT_AMBULATORY_CARE_PROVIDER_SITE_OTHER): Payer: Medicare Other

## 2020-04-18 ENCOUNTER — Other Ambulatory Visit: Payer: Self-pay

## 2020-04-18 VITALS — BP 108/72 | HR 58 | Resp 16 | Ht 67.0 in | Wt 205.4 lb

## 2020-04-18 DIAGNOSIS — F5101 Primary insomnia: Secondary | ICD-10-CM

## 2020-04-18 DIAGNOSIS — Z0000 Encounter for general adult medical examination without abnormal findings: Secondary | ICD-10-CM | POA: Diagnosis not present

## 2020-04-18 DIAGNOSIS — Z789 Other specified health status: Secondary | ICD-10-CM | POA: Diagnosis not present

## 2020-04-18 MED ORDER — ZOLPIDEM TARTRATE 10 MG PO TABS: 10.0000 mg | ORAL_TABLET | Freq: Every day | ORAL | 1 refills | Status: DC

## 2020-04-18 NOTE — Progress Notes (Signed)
Subjective:   Tracy Chase is a 64 y.o. female who presents for an Initial Medicare Annual Wellness Visit.  Review of Systems     Cardiac Risk Factors include: advanced age (>17men, >55 women);obesity (BMI >30kg/m2)     Objective:    Today's Vitals   04/18/20 0921  BP: 108/72  Pulse: (!) 58  Resp: 16  SpO2: 99%  Weight: 205 lb 6.4 oz (93.2 kg)  Height: 5\' 7"  (1.702 m)   Body mass index is 32.17 kg/m.  Advanced Directives 04/18/2020 08/28/2019 01/18/2016 12/23/2015  Does Patient Have a Medical Advance Directive? No No No No  Would patient like information on creating a medical advance directive? No - Patient declined - No - patient declined information No - patient declined information    Current Medications (verified) Outpatient Encounter Medications as of 04/18/2020  Medication Sig  . baclofen (LIORESAL) 10 MG tablet Take 1 tablet (10 mg total) by mouth 3 (three) times daily.  Marland Kitchen FLUoxetine (PROZAC) 10 MG capsule Take 1 capsule (10 mg total) by mouth daily.  Marland Kitchen FLUoxetine (PROZAC) 20 MG capsule TAKE 1 CAPSULE BY MOUTH  DAILY  . hydrocortisone (ANUSOL-HC) 2.5 % rectal cream Place 1 application rectally 2 (two) times daily.  . psyllium (METAMUCIL) 58.6 % packet Take 1 packet by mouth daily.  Marland Kitchen triamterene-hydrochlorothiazide (MAXZIDE-25) 37.5-25 MG tablet Take 1 tablet by mouth daily.  Marland Kitchen etodolac (LODINE) 400 MG tablet Take 400 mg by mouth 2 (two) times daily.  Marland Kitchen zolpidem (AMBIEN) 10 MG tablet Take 1 tablet (10 mg total) by mouth at bedtime.   No facility-administered encounter medications on file as of 04/18/2020.    Allergies (verified) Hydrocodone   History: Past Medical History:  Diagnosis Date  . Cancer (HCC)    RENAL CELL  . Depression   . Insomnia    Past Surgical History:  Procedure Laterality Date  . ABDOMINAL HYSTERECTOMY    . APPENDECTOMY    . BACK SURGERY    . COLONOSCOPY WITH PROPOFOL N/A 08/28/2019   Procedure: COLONOSCOPY WITH PROPOFOL;  Surgeon:  Virgel Manifold, MD;  Location: [facility] ENDOSCOPY;  Service: Endoscopy;  Laterality: N/A;  . cryoablation for renal cell carcinoma  2012  . JOINT REPLACEMENT    . LUMBAR FUSION    . MEDIAL PARTIAL KNEE REPLACEMENT Left   . PARTIAL HYSTERECTOMY    . TONSILLECTOMY    . VENTRAL HERNIA REPAIR  2012   Dr. Genevive Bi   Family History  Problem Relation Age of Onset  . Kidney failure Mother   . Congestive Heart Failure Father   . Breast cancer Neg Hx    Social History   Socioeconomic History  . Marital status: Significant Other    Spouse name: Not on file  . Number of children: 1  . Years of education: Not on file  . Highest education level: Not on file  Occupational History  . Not on file  Tobacco Use  . Smoking status: Never Smoker  . Smokeless tobacco: Never Used  Substance and Sexual Activity  . Alcohol use: Yes    Alcohol/week: 0.0 standard drinks    Comment: socially  . Drug use: No  . Sexual activity: Not on file  Other Topics Concern  . Not on file  Social History Narrative   Pt has 1 son and 2 step daughters. Lives with significant other   Social Determinants of Health   Financial Resource Strain: Low Risk   . Difficulty of Paying  Living Expenses: Not hard at all  Food Insecurity: No Food Insecurity  . Worried About Charity fundraiser in the Last Year: Never true  . Ran Out of Food in the Last Year: Never true  Transportation Needs: No Transportation Needs  . Lack of Transportation (Medical): No  . Lack of Transportation (Non-Medical): No  Physical Activity: Insufficiently Active  . Days of Exercise per Week: 4 days  . Minutes of Exercise per Session: 30 min  Stress: Stress Concern Present  . Feeling of Stress : Rather much  Social Connections: Somewhat Isolated  . Frequency of Communication with Friends and Family: More than three times a week  . Frequency of Social Gatherings with Friends and Family: Once a week  . Attends Religious Services: Never  . Active  Member of Clubs or Organizations: No  . Attends Archivist Meetings: Never  . Marital Status: Living with partner    Tobacco Counseling Counseling given: Not Answered   Clinical Intake:  Pre-visit preparation completed: No  Pain : No/denies pain     BMI - recorded: 32.17 Nutritional Status: BMI > 30  Obese Nutritional Risks: None Diabetes: No  How often do you need to have someone help you when you read instructions, pamphlets, or other written materials from your doctor or pharmacy?: 1 - Never  Interpreter Needed?: No  Information entered by :: Clemetine Marker LPN   Activities of Daily Living In your present state of health, do you have any difficulty performing the following activities: 04/18/2020 09/23/2019  Hearing? N N  Comment declines hearing aids -  Vision? N N  Difficulty concentrating or making decisions? N N  Walking or climbing stairs? N N  Dressing or bathing? N N  Doing errands, shopping? N N  Preparing Food and eating ? N -  Using the Toilet? N -  In the past six months, have you accidently leaked urine? N -  Do you have problems with loss of bowel control? N -  Managing your Medications? N -  Managing your Finances? N -  Housekeeping or managing your Housekeeping? N -  Some recent data might be hidden     Immunizations and Health Maintenance Immunization History  Administered Date(s) Administered  . Fluad Quad(high Dose 65+) 09/02/2019  . Influenza Inj Mdck Quad Pf 08/18/2018  . Influenza,inj,Quad PF,6+ Mos 10/02/2016, 09/03/2017  . PFIZER SARS-COV-2 Vaccination 02/08/2020, 03/01/2020  . Pneumococcal Conjugate-13 09/23/2019  . Tdap 04/29/2012   Health Maintenance Due  Topic Date Due  . Hepatitis C Screening  Never done    Patient Care Team: Glean Hess, MD as PCP - General (Internal Medicine) Hessie Knows, MD as Consulting Physician (Orthopedic Surgery) Sharlet Salina, MD as Referring Physician (Physical Medicine and  Rehabilitation) Rico Junker, RN as Registered Nurse Theodore Demark, RN as Registered Nurse Jannet Mantis, MD (Dermatology)  Indicate any recent Medical Services you may have received from other than [facility] providers in the past year (date may be approximate).     Assessment:   This is a routine wellness examination for [place].  Hearing/Vision screen  Hearing Screening   125Hz  250Hz  500Hz  1000Hz  2000Hz  3000Hz  4000Hz  6000Hz  8000Hz   Right ear:           Left ear:           Comments: Pt denies hearing difficulty  Vision Screening Comments: [facility] screen[facility]; had lasik, wears reading glasses  Dietary issues and exercise  activities discussed: Current Exercise Habits: Home exercise routine, Type of exercise: walking, Time (Minutes): 30, Frequency (Times/Week): 4, Weekly Exercise (Minutes/Week): 120, Intensity: Moderate, Exercise limited by: orthopedic condition(s)  Goals    . Weight (lb) < 200 lb (90.7 kg)     Pt would like to lose weight over the next year with healthy eating and physical activity      Depression Screen PHQ 2/9 Scores 04/18/2020 03/10/2020 12/23/2019 09/23/2019 04/17/2019 08/01/2018 08/30/2017  PHQ - 2 Score 3 0 0 0 0 0 1  PHQ- 9 Score 12 0 - 5 - - -    Fall Risk Fall Risk  04/18/2020 03/10/2020 04/17/2019  Falls in the past year? 0 0 0  Number falls in past yr: 0 0 0  Injury with Fall? 0 0 0  Risk for fall due to : No Fall Risks No Fall Risks -  Follow up Falls prevention discussed Falls evaluation completed Falls evaluation completed    [place]:   Any stairs in or around the home? Yes  If so, are there any without handrails? Yes   Home free of loose throw rugs in walkways, pet beds, electrical cords, etc? Yes  Adequate lighting in your home to reduce risk of falls? Yes   ASSISTIVE DEVICES UTILIZED TO PREVENT FALLS:  Life alert? No  Use of a cane, walker or w/c? No  Grab bars in the  bathroom? No  Shower chair or bench in shower? No  Elevated toilet seat or a handicapped toilet? No   DME ORDERS:  DME order needed?  No   TIMED UP AND GO:  Was the test performed? Yes .  Length of time to ambulate 10 feet: 5 sec.   GAIT:  Appearance of gait: Gait steady and fast without the use of an assistive device.   Education: Fall risk prevention has been discussed.  Intervention(s) required? No    DME/home health order needed?  No   Cognitive Function:     6CIT Screen 04/18/2020  What Year? 0 points  What month? 0 points  What time? 0 points  Count back from 20 0 points  Months in reverse 0 points  Repeat phrase 0 points  Total Score 0    Screening Tests Health Maintenance  Topic Date Due  . Hepatitis C Screening  Never done  . INFLUENZA VACCINE  06/12/2020  . PNA vac Low Risk Adult (2 of 2 - PPSV23) 09/22/2020  . MAMMOGRAM  10/06/2020  . TETANUS/TDAP  04/29/2022  . COLONOSCOPY  08/27/2024  . DEXA SCAN  Completed  . COVID-19 Vaccine  Completed    Qualifies for Shingles Vaccine? Yes . Due for Shingrix. Education has been provided regarding the importance of this vaccine. Pt has been advised to call insurance company to determine out of pocket expense. Advised may also receive vaccine at local pharmacy or Health Dept. Verbalized acceptance and understanding.  Tdap: Up to date  Flu Vaccine: Up to date  Pneumococcal Vaccine: Up to date  Covid-19 Vaccine: Up to date   Cancer Screenings:  Colorectal Screening: Completed 08/28/19. Repeat every 5 years;  Mammogram: Completed 10/07/19. Repeat every year;   Bone Density: Completed 10/07/19. Results reflect NORMAL. Repeat every 2 years.   Lung Cancer Screening: (Low Dose CT Chest recommended if Age 33-80 years, 30 pack-year currently smoking OR have quit w/in 15years.) does not qualify.   Additional Screening:  Hepatitis C Screening: does qualify; postponed  Vision Screening:  Recommended annual  ophthalmology exams for early detection of glaucoma and other disorders of the eye. Is the patient up to date with their annual eye exam?  Yes  Who is the provider or what is the name of the office in which the pt attends annual eye exams?  [place] Screening: Recommended annual dental exams for proper oral hygiene  Community Resource Referral:  CRR required this visit?  Yes - counseling for grief support and caregiver stress      Plan:    I have personally reviewed and addressed the Medicare Annual Wellness questionnaire and have noted the following in the patient's chart:  A. Medical and social history B. Use of alcohol, tobacco or illicit drugs  C. Current medications and supplements D. Functional ability and status E.  Nutritional status F.  Physical activity G. Advance directives H. List of other physicians I.  Hospitalizations, surgeries, and ER visits in previous 12 months J.  [place] such as hearing and vision if needed, cognitive and depression L. Referrals and appointments   In addition, I have reviewed and discussed with patient certain preventive protocols, quality metrics, and best practice recommendations. A written personalized care plan for preventive services as well as general preventive health recommendations were provided to patient.   Signed,  Clemetine Marker, LPN Nurse Health Advisor   Nurse Notes: pt c/o waking up feeling clammy for the past month. She has also noticed frequent daily headaches in the last month and wondering if this could be a side effect from the Covid-19 vaccine. She stated she mentioned this to her GYN last week to wonder if it is hormone related but they did not address it.   Pt also prescribed etodolac for knees by ortho last week and waiting for it to arrive via mail order and concerned about possible drug interaction and side effects from NSAID. She is not taking prozac 30 mg daily. She sometimes takes the  10 mg and sometimes takes the 20 mg. Pt feeling anxious and easily irritated due to a lot going on. Referral sent to [facility] for counseling resources.

## 2020-04-18 NOTE — Telephone Encounter (Signed)
Patient needs refills for   zolpidem (AMBIEN) 10 MG tablet [[id number]] ENDED  Order Details Dose: 10 mg Route: Oral Frequency: Daily at bedtime  Dispense Quantity: 90 tablet Refills: 1       Sig: Take 1 tablet (10 mg total) by mouth at bedtime.      Start Date: 09/23/19 End Date: 03/10/20 after 90 doses  Written Date: 09/23/19 Expiration Date: 03/21/20    Diagnosis Association: Idiopathic insomnia (F51.01)  Original Order:  zolpidem (AMBIEN) 5 MG tablet [[id number]]  Providers  Authorizing Provider: Glean Hess, MD NPI: [id number]  DEA #: [id number]  Ordering User:  Glean Hess, [place], [place] [facility]  [address] [place] [address], [place] [address]  Phone:  [phone] Fax:  [phone]

## 2020-04-18 NOTE — Patient Instructions (Signed)
Tracy Chase , Thank you for taking time to come for your Medicare Wellness Visit. I appreciate your ongoing commitment to your health goals. Please review the following plan we discussed and let me know if I can assist you in the future.   Screening recommendations/referrals: Colonoscopy: done 08/28/19. Repeat in 2025 Mammogram: done 10/07/19 Bone Density: done 10/07/19 Recommended yearly ophthalmology/optometry visit for glaucoma screening and checkup Recommended yearly dental visit for hygiene and checkup  Vaccinations: Influenza vaccine: done 09/02/19 Pneumococcal vaccine: done 09/23/19 Tdap vaccine: done 04/29/12 Shingles vaccine: Shingrix discussed. Please contact your pharmacy for coverage information.  Covid-19:done 02/08/20 & 03/01/20  Advanced directives: Advance directive discussed with you today. Even though you declined this today please call our office should you change your mind and we can give you the proper paperwork for you to fill out.  Conditions/risks identified: Recommend healthy eating and physical activity for desired weight loss  Next appointment: Follow up in one year for your annual wellness visit    Preventive Care 65 Years and Older, Female Preventive care refers to lifestyle choices and visits with your health care provider that can promote health and wellness. What does preventive care include?  A yearly physical exam. This is also called an annual well check.  Dental exams once or twice a year.  Routine eye exams. Ask your health care provider how often you should have your eyes checked.  Personal lifestyle choices, including:  Daily care of your teeth and gums.  Regular physical activity.  Eating a healthy diet.  Avoiding tobacco and drug use.  Limiting alcohol use.  Practicing safe sex.  Taking low-dose aspirin every day.  Taking vitamin and mineral supplements as recommended by your health care provider. What happens during an annual  well check? The services and screenings done by your health care provider during your annual well check will depend on your age, overall health, lifestyle risk factors, and family history of disease. Counseling  Your health care provider may ask you questions about your:  Alcohol use.  Tobacco use.  Drug use.  Emotional well-being.  Home and relationship well-being.  Sexual activity.  Eating habits.  History of falls.  Memory and ability to understand (cognition).  Work and work Statistician.  Reproductive health. Screening  You may have the following tests or measurements:  Height, weight, and BMI.  Blood pressure.  Lipid and cholesterol levels. These may be checked every 5 years, or more frequently if you are over 61 years old.  Skin check.  Lung cancer screening. You may have this screening every year starting at age 37 if you have a 30-pack-year history of smoking and currently smoke or have quit within the past 15 years.  Fecal occult blood test (FOBT) of the stool. You may have this test every year starting at age 21.  Flexible sigmoidoscopy or colonoscopy. You may have a sigmoidoscopy every 5 years or a colonoscopy every 10 years starting at age 13.  Hepatitis C blood test.  Hepatitis B blood test.  Sexually transmitted disease (STD) testing.  Diabetes screening. This is done by checking your blood sugar (glucose) after you have not eaten for a while (fasting). You may have this done every 1-3 years.  Bone density scan. This is done to screen for osteoporosis. You may have this done starting at age 30.  Mammogram. This may be done every 1-2 years. Talk to your health care provider about how often you should have regular mammograms. Talk with your  health care provider about your test results, treatment options, and if necessary, the need for more tests. Vaccines  Your health care provider may recommend certain vaccines, such as:  Influenza vaccine. This  is recommended every year.  Tetanus, diphtheria, and acellular pertussis (Tdap, Td) vaccine. You may need a Td booster every 10 years.  Zoster vaccine. You may need this after age 17.  Pneumococcal 13-valent conjugate (PCV13) vaccine. One dose is recommended after age 98.  Pneumococcal polysaccharide (PPSV23) vaccine. One dose is recommended after age 37. Talk to your health care provider about which screenings and vaccines you need and how often you need them. This information is not intended to replace advice given to you by your health care provider. Make sure you discuss any questions you have with your health care provider. Document Released: 11/25/2015 Document Revised: 07/18/2016 Document Reviewed: 08/30/2015 Elsevier Interactive Patient Education  2017 [place] Prevention in the Home Falls can cause injuries. They can happen to people of all ages. There are many things you can do to make your home safe and to help prevent falls. What can I do on the outside of my home?  Regularly fix the edges of walkways and driveways and fix any cracks.  Remove anything that might make you trip as you walk through a door, such as a raised step or threshold.  Trim any bushes or trees on the path to your home.  Use bright outdoor lighting.  Clear any walking paths of anything that might make someone trip, such as rocks or tools.  Regularly check to see if handrails are loose or broken. Make sure that both sides of any steps have handrails.  Any raised decks and porches should have guardrails on the edges.  Have any leaves, snow, or ice cleared regularly.  Use sand or salt on walking paths during winter.  Clean up any spills in your garage right away. This includes oil or grease spills. What can I do in the bathroom?  Use night lights.  Install grab bars by the toilet and in the tub and shower. Do not use towel bars as grab bars.  Use non-skid mats or decals in the tub or  shower.  If you need to sit down in the shower, use a plastic, non-slip stool.  Keep the floor dry. Clean up any water that spills on the floor as soon as it happens.  Remove soap buildup in the tub or shower regularly.  Attach bath mats securely with double-sided non-slip rug tape.  Do not have throw rugs and other things on the floor that can make you trip. What can I do in the bedroom?  Use night lights.  Make sure that you have a light by your bed that is easy to reach.  Do not use any sheets or blankets that are too big for your bed. They should not hang down onto the floor.  Have a firm chair that has side arms. You can use this for support while you get dressed.  Do not have throw rugs and other things on the floor that can make you trip. What can I do in the kitchen?  Clean up any spills right away.  Avoid walking on wet floors.  Keep items that you use a lot in easy-to-reach places.  If you need to reach something above you, use a strong step stool that has a grab bar.  Keep electrical cords out of the way.  Do not use  floor polish or wax that makes floors slippery. If you must use wax, use non-skid floor wax.  Do not have throw rugs and other things on the floor that can make you trip. What can I do with my stairs?  Do not leave any items on the stairs.  Make sure that there are handrails on both sides of the stairs and use them. Fix handrails that are broken or loose. Make sure that handrails are as long as the stairways.  Check any carpeting to make sure that it is firmly attached to the stairs. Fix any carpet that is loose or worn.  Avoid having throw rugs at the top or bottom of the stairs. If you do have throw rugs, attach them to the floor with carpet tape.  Make sure that you have a light switch at the top of the stairs and the bottom of the stairs. If you do not have them, ask someone to add them for you. What else can I do to help prevent falls?   Wear shoes that:  Do not have high heels.  Have rubber bottoms.  Are comfortable and fit you well.  Are closed at the toe. Do not wear sandals.  If you use a stepladder:  Make sure that it is fully opened. Do not climb a closed stepladder.  Make sure that both sides of the stepladder are locked into place.  Ask someone to hold it for you, if possible.  Clearly mark and make sure that you can see:  Any grab bars or handrails.  First and last steps.  Where the edge of each step is.  Use tools that help you move around (mobility aids) if they are needed. These include:  Canes.  Walkers.  Scooters.  Crutches.  Turn on the lights when you go into a dark area. Replace any light bulbs as soon as they burn out.  Set up your furniture so you have a clear path. Avoid moving your furniture around.  If any of your floors are uneven, fix them.  If there are any pets around you, be aware of where they are.  Review your medicines with your doctor. Some medicines can make you feel dizzy. This can increase your chance of falling. Ask your doctor what other things that you can do to help prevent falls. This information is not intended to replace advice given to you by your health care provider. Make sure you discuss any questions you have with your health care provider. Document Released: 08/25/2009 Document Revised: 04/05/2016 Document Reviewed: 12/03/2014 Elsevier Interactive Patient Education  2017 [employer].

## 2020-04-18 NOTE — Telephone Encounter (Signed)
Please Advise.  KP

## 2020-05-09 ENCOUNTER — Ambulatory Visit: Payer: Self-pay | Admitting: *Deleted

## 2020-05-09 ENCOUNTER — Telehealth: Payer: Self-pay | Admitting: Internal Medicine

## 2020-05-09 NOTE — Telephone Encounter (Signed)
Patient states she missed a call from office.  Patient states she keeps missing call due to the number comes up as spam. Please call back [phone]

## 2020-05-09 NOTE — Telephone Encounter (Signed)
Three attempts made to contact the patient but no return call.

## 2020-05-09 NOTE — Telephone Encounter (Signed)
Pt said she will get otc meds, will not go to [facility] to be evaluated for covid

## 2020-05-09 NOTE — Telephone Encounter (Unsigned)
Copied from [place] [id number]. Topic: General - Other >> May 09, 2020  8:15 AM Celene Kras wrote: Reason for CRM: Pt called stating that she is experiencing a cough, sore throat, ear pain and some body aches. Pt is requesting to have an appt asap. Please advise.

## 2020-05-09 NOTE — Telephone Encounter (Signed)
Message from Bennett Scrape sent at 05/09/2020 9:34 AM EDT  PT has a runny nose / urgenn care will not take her insurance / please advise

## 2020-05-10 ENCOUNTER — Telehealth: Payer: Self-pay | Admitting: Internal Medicine

## 2020-05-10 NOTE — Telephone Encounter (Signed)
° °  SF 05/10/2020    Name: Tracy Chase    MRN: [id number]    DOB: [birth year]    AGE: 64 y.o.    GENDER: female    PCP Glean Hess, MD.   Called pt regarding Community Resource Referral for counseling resources. Spoke with [name] today, asked patient is she still needs assistance with finding counseling services. Patient stated that she is still in need, but she currently does not have time to discuss resources. Patient asked if Care Guide can send a letter with a list of resources. Informed patient that letter will be sent.    [place], Care Management Phone: [phone] Email: [email or web address]

## 2020-05-10 NOTE — Telephone Encounter (Signed)
Called [facility] to get a better understanding of their services and what the process is for a patient to receive counseling services. Per their voicemail message, the patient will need to call and leave a message and someone from their office will give them a call back. Care Guide will forward information to patient.

## 2020-05-12 NOTE — Telephone Encounter (Signed)
Per patient request letter will be sent with list of resources for counseling. Sent e-mail to Lubertha Sayres to print and mail letter to patient. Patient has not additional needs at this time.   [email or web address], [email or web address]  Secure: Letter for Tracy Chase  [facility],   When you have some time can you print and mail the attached letter to Ms. Tracy Chase?  Thanks,   [place], Care Management Phone: [phone] Email: [email or web address]   Closing referral pending any other needs of patient.

## 2020-05-24 ENCOUNTER — Encounter: Payer: Self-pay | Admitting: Internal Medicine

## 2020-05-24 ENCOUNTER — Other Ambulatory Visit: Payer: Self-pay

## 2020-05-24 ENCOUNTER — Ambulatory Visit (INDEPENDENT_AMBULATORY_CARE_PROVIDER_SITE_OTHER): Payer: Medicare Other | Admitting: Internal Medicine

## 2020-05-24 VITALS — BP 122/72 | HR 73 | Temp 98.0°F | Ht 67.0 in | Wt 209.0 lb

## 2020-05-24 DIAGNOSIS — R1013 Epigastric pain: Secondary | ICD-10-CM | POA: Diagnosis not present

## 2020-05-24 MED ORDER — OMEPRAZOLE 20 MG PO CPDR: 20.0000 mg | DELAYED_RELEASE_CAPSULE | Freq: Two times a day (BID) | ORAL | 0 refills | Status: DC

## 2020-05-24 NOTE — Progress Notes (Signed)
Date:  05/24/2020   Name:  Tracy Chase   DOB:  1956-06-23   MRN:  [id number]   Chief Complaint: Abdominal Pain (X1.5 months lower abdomen at first the moved to LUQ , feeling bloated , regular but not how they used to be texture and size it different.) started 6 weeks ago with a 10 min episode of severe pain in the lower abdomen.  This resolved on its own and has not recurred. Abdominal Pain This is a new problem. The current episode started 1 to 4 weeks ago. The problem occurs daily. The problem has been unchanged. The pain is located in the epigastric region and LUQ. The pain is mild. The quality of the pain is a sensation of fullness. The abdominal pain does not radiate. Associated symptoms include nausea. Pertinent negatives include no belching, constipation, diarrhea, headaches, hematochezia, vomiting or weight loss. Nothing aggravates the pain. She has tried nothing for the symptoms.  Colonoscopy done 08/2019  Lab Results  Component Value Date   CREATININE 0.82 09/23/2019   BUN 9 09/23/2019   NA 138 09/23/2019   K 3.6 09/23/2019   CL 97 09/23/2019   CO2 25 09/23/2019   Lab Results  Component Value Date   CHOL 247 (H) 09/23/2019   HDL 93 09/23/2019   LDLCALC 135 (H) 09/23/2019   TRIG 111 09/23/2019   CHOLHDL 2.7 09/23/2019   Lab Results  Component Value Date   TSH 4.220 09/23/2019   No results found for: HGBA1C Lab Results  Component Value Date   WBC 5.7 09/23/2019   HGB 13.7 09/23/2019   HCT 41.1 09/23/2019   MCV 91 09/23/2019   PLT 266 09/23/2019   Lab Results  Component Value Date   ALT 12 09/23/2019   AST 18 09/23/2019   ALKPHOS 77 09/23/2019   BILITOT 0.4 09/23/2019     Review of Systems  Constitutional: Negative for chills, diaphoresis, fatigue and weight loss.  Respiratory: Negative for chest tightness and shortness of breath.   Cardiovascular: Negative for chest pain.  Gastrointestinal: Positive for abdominal pain and nausea. Negative for blood in  stool, constipation, diarrhea, hematochezia and vomiting.  Neurological: Negative for dizziness and headaches.    Patient Active Problem List   Diagnosis Date Noted  . Mixed hyperlipidemia 09/25/2019  . Gastroesophageal reflux disease 09/23/2019  . Encounter for screening colonoscopy   . Polyp of colon   . Bone spur of foot 03/19/2018  . Degenerative disc disease, cervical 11/18/2017  . Anxiety 06/21/2015  . History of renal cell cancer 06/21/2015  . Carpal tunnel syndrome 06/21/2015  . Essential (primary) hypertension 06/21/2015  . Idiopathic insomnia 06/21/2015    Allergies  Allergen Reactions  . Hydrocodone Itching    Past Surgical History:  Procedure Laterality Date  . ABDOMINAL HYSTERECTOMY    . APPENDECTOMY    . BACK SURGERY    . COLONOSCOPY WITH PROPOFOL N/A 08/28/2019   Procedure: COLONOSCOPY WITH PROPOFOL;  Surgeon: Virgel Manifold, MD;  Location: [facility] ENDOSCOPY;  Service: Endoscopy;  Laterality: N/A;  . cryoablation for renal cell carcinoma  2012  . JOINT REPLACEMENT    . LUMBAR FUSION    . MEDIAL PARTIAL KNEE REPLACEMENT Left   . PARTIAL HYSTERECTOMY    . TONSILLECTOMY    . VENTRAL HERNIA REPAIR  2012   Dr. Genevive Bi    Social History   Tobacco Use  . Smoking status: Never Smoker  . Smokeless tobacco: Never Used  Vaping Use  .  Vaping Use: Never used  Substance Use Topics  . Alcohol use: Yes    Alcohol/week: 0.0 standard drinks    Comment: socially  . Drug use: No     Medication list has been reviewed and updated.  Current Meds  Medication Sig  . baclofen (LIORESAL) 10 MG tablet Take 1 tablet (10 mg total) by mouth 3 (three) times daily. (Patient taking differently: Take 10 mg by mouth as needed. )  . FLUoxetine (PROZAC) 20 MG capsule TAKE 1 CAPSULE BY MOUTH  DAILY  . hydrocortisone (ANUSOL-HC) 2.5 % rectal cream Place 1 application rectally 2 (two) times daily.  . meloxicam (MOBIC) 15 MG tablet Take 15 mg by mouth daily.  . psyllium  (METAMUCIL) 58.6 % packet Take 1 packet by mouth daily.  Marland Kitchen triamterene-hydrochlorothiazide (MAXZIDE-25) 37.5-25 MG tablet Take 1 tablet by mouth daily.  Marland Kitchen zolpidem (AMBIEN) 10 MG tablet Take 1 tablet (10 mg total) by mouth at bedtime.    PHQ 2/9 Scores 05/24/2020 04/18/2020 03/10/2020 12/23/2019  PHQ - 2 Score 0 3 0 0  PHQ- 9 Score 0 12 0 -    GAD 7 : Generalized Anxiety Score 05/24/2020 03/10/2020 09/23/2019  Nervous, Anxious, on Edge 3 2 0  Control/stop worrying 2 3 0  Worry too much - different things 2 3 2   Trouble relaxing 0 2 3  Restless 0 2 2  Easily annoyed or irritable 0 2 0  Afraid - awful might happen 1 3 0  Total GAD 7 Score 8 17 7   Anxiety Difficulty Not difficult at all Very difficult Somewhat difficult    BP Readings from Last 3 Encounters:  05/24/20 122/72  04/18/20 108/72  03/10/20 126/70    Physical Exam Vitals and nursing note reviewed.  Constitutional:      General: She is not in acute distress.    Appearance: She is well-developed.  HENT:     Head: Normocephalic and atraumatic.  Pulmonary:     Effort: Pulmonary effort is normal. No respiratory distress.  Abdominal:     General: Bowel sounds are normal.     Palpations: Abdomen is soft. There is no hepatomegaly or splenomegaly.     Tenderness: There is abdominal tenderness in the epigastric area and left upper quadrant. There is no guarding or rebound. Negative signs include Murphy's sign.     Hernia: No hernia is present.  Musculoskeletal:        General: Normal range of motion.  Skin:    General: Skin is warm and dry.     Findings: No rash.  Neurological:     Mental Status: She is alert and oriented to person, place, and time.  Psychiatric:        Behavior: Behavior normal.        Thought Content: Thought content normal.     Wt Readings from Last 3 Encounters:  05/24/20 209 lb (94.8 kg)  04/18/20 205 lb 6.4 oz (93.2 kg)  03/10/20 203 lb (92.1 kg)    BP 122/72   Pulse 73   Temp 98 F (36.7  C) (Oral)   Ht 5\' 7"  (1.702 m)   Wt 209 lb (94.8 kg)   SpO2 96%   BMI 32.73 kg/m   Assessment and Plan: 1. Epigastric pain Suspect gastritis so will test for H Pylori Start bid PPI May need EGD if sx do not respond to appropriate treatment - H. pylori breath test - omeprazole (PRILOSEC) 20 MG capsule; Take 1 capsule (20 mg  total) by mouth 2 (two) times daily before a meal.  Dispense: 60 capsule; Refill: 0   Partially dictated using Editor, commissioning. Any errors are unintentional.  Halina Maidens, MD [facility]  05/24/2020

## 2020-05-26 LAB — H. PYLORI BREATH TEST: H pylori Breath Test: NEGATIVE

## 2020-06-14 ENCOUNTER — Telehealth: Payer: Self-pay | Admitting: Internal Medicine

## 2020-06-14 NOTE — Telephone Encounter (Signed)
Copied from [place] [phone] [phone]. Topic: Appointment Scheduling - Scheduling Inquiry for Clinic >> Jun 14, 2020 10:26 AM Sheran Luz wrote: Patient requesting an appointment with Dr. Ronnald Ramp for ear and tooth ache.

## 2020-06-15 ENCOUNTER — Encounter: Payer: Self-pay | Admitting: Family Medicine

## 2020-06-15 ENCOUNTER — Other Ambulatory Visit: Payer: Self-pay

## 2020-06-15 ENCOUNTER — Ambulatory Visit (INDEPENDENT_AMBULATORY_CARE_PROVIDER_SITE_OTHER): Payer: Medicare Other | Admitting: Family Medicine

## 2020-06-15 ENCOUNTER — Other Ambulatory Visit: Payer: Self-pay | Admitting: Internal Medicine

## 2020-06-15 VITALS — BP 128/78 | HR 94 | Temp 98.8°F | Ht 67.0 in | Wt 212.0 lb

## 2020-06-15 DIAGNOSIS — J0100 Acute maxillary sinusitis, unspecified: Secondary | ICD-10-CM | POA: Diagnosis not present

## 2020-06-15 DIAGNOSIS — R1013 Epigastric pain: Secondary | ICD-10-CM

## 2020-06-15 DIAGNOSIS — H6983 Other specified disorders of Eustachian tube, bilateral: Secondary | ICD-10-CM

## 2020-06-15 DIAGNOSIS — G510 Bell's palsy: Secondary | ICD-10-CM | POA: Diagnosis not present

## 2020-06-15 MED ORDER — PREDNISONE 10 MG PO TABS: 10.0000 mg | ORAL_TABLET | Freq: Every day | ORAL | 0 refills | Status: DC

## 2020-06-15 MED ORDER — AZITHROMYCIN 250 MG PO TABS: ORAL_TABLET | ORAL | 0 refills | Status: DC

## 2020-06-15 NOTE — Patient Instructions (Signed)
Bell Palsy, Adult  Bell palsy is a short-term inability to move muscles in part of the face. The inability to move (paralysis) results from inflammation or compression of the facial nerve, which travels along the skull and under the ear to the side of the face (7th cranial nerve). This nerve is responsible for facial movements that include blinking, closing the eyes, smiling, and frowning. What are the causes? The exact cause of this condition is not known. It may be caused by an infection from a virus, such as the chickenpox (herpes zoster), Epstein-Barr, or mumps virus. What increases the risk? You are more likely to develop this condition if:  You are pregnant.  You have diabetes.  You have had a recent infection in your nose, throat, or airways (upper respiratory infection).  You have a weakened body defense system (immune system).  You have had a facial injury, such as a fracture.  You have a family history of Bell palsy. What are the signs or symptoms? Symptoms of this condition include:  Weakness on one side of the face.  Drooping eyelid and corner of the mouth.  Excessive tearing in one eye.  Difficulty closing the eyelid.  Dry eye.  Drooling.  Dry mouth.  Changes in taste.  Change in facial appearance.  Pain behind one ear.  Ringing in one or both ears.  Sensitivity to sound in one ear.  Facial twitching.  Headache.  Impaired speech.  Dizziness.  Difficulty eating or drinking. Most of the time, only one side of the face is affected. Rarely, Bell palsy affects the whole face. How is this diagnosed? This condition is diagnosed based on:  Your symptoms.  Your medical history.  A physical exam. You may also have to see health care providers who specialize in disorders of the nerves (neurologist) or diseases and conditions of the eye (ophthalmologist). You may have tests, such as:  A test to check for nerve damage (electromyogram).  Imaging  studies, such as CT or MRI scans.  Blood tests. How is this treated? This condition affects every person differently. Sometimes symptoms go away without treatment within a couple weeks. If treatment is needed, it varies from person to person. The goal of treatment is to reduce inflammation and protect the eye from damage. Treatment for Bell palsy may include:  Medicines, such as: ? Steroids to reduce swelling and inflammation. ? Antiviral drugs. ? Pain relievers, including aspirin, acetaminophen, or ibuprofen.  Eye drops or ointment to keep your eye moist.  Eye protection, if you cannot close your eye.  Exercises or massage to regain muscle strength and function (physical therapy). Follow these instructions at home:   Take over-the-counter and prescription medicines only as told by your health care provider.  If your eye is affected: ? Keep your eye moist with eye drops or ointment as told by your health care provider. ? Follow instructions for eye care and protection as told by your health care provider.  Do any physical therapy exercises as told by your health care provider.  Keep all follow-up visits as told by your health care provider. This is important. Contact a health care provider if:  You have a fever.  Your symptoms do not get better within 2-3 weeks, or your symptoms get worse.  Your eye is red, irritated, or painful.  You have new symptoms. Get help right away if:  You have weakness or numbness in a part of your body other than your face.  You have   trouble swallowing.  You develop neck pain or stiffness.  You develop dizziness or shortness of breath. Summary  Bell palsy is a short-term inability to move muscles in part of the face. The inability to move (paralysis) results from inflammation or compression of the facial nerve.  This condition affects every person differently. Sometimes symptoms go away without treatment within a couple weeks.  If  treatment is needed, it varies from person to person. The goal of treatment is to reduce inflammation and protect the eye from damage.  Contact your health care provider if your symptoms do not get better within 2-3 weeks, or your symptoms get worse. This information is not intended to replace advice given to you by your health care provider. Make sure you discuss any questions you have with your health care provider. Document Revised: 10/11/2017 Document Reviewed: 01/01/2017 Elsevier Patient Education  2020 [employer]  

## 2020-06-15 NOTE — Progress Notes (Signed)
Date:  06/15/2020   Name:  Tracy Chase   DOB:  25-Jan-1956   MRN:  [id number]   Chief Complaint: Sinusitis (X2-3 days, right ear pain, left side face swollen, tooth pain, cant breathe, coughing a little, drainage down throat )  Sinusitis This is a new problem. The current episode started in the past 7 days. The problem has been waxing and waning since onset. There has been no fever. Her pain is at a severity of 7/10. The pain is moderate. Associated symptoms include congestion, ear pain and sinus pressure. Pertinent negatives include no chills, coughing, diaphoresis, headaches, hoarse voice, shortness of breath, sneezing or sore throat. (Prod nasal drainage) Treatments tried: flonase/ zyrtec. The treatment provided no relief.    Lab Results  Component Value Date   CREATININE 0.82 09/23/2019   BUN 9 09/23/2019   NA 138 09/23/2019   K 3.6 09/23/2019   CL 97 09/23/2019   CO2 25 09/23/2019   Lab Results  Component Value Date   CHOL 247 (H) 09/23/2019   HDL 93 09/23/2019   LDLCALC 135 (H) 09/23/2019   TRIG 111 09/23/2019   CHOLHDL 2.7 09/23/2019   Lab Results  Component Value Date   TSH 4.220 09/23/2019   No results found for: HGBA1C Lab Results  Component Value Date   WBC 5.7 09/23/2019   HGB 13.7 09/23/2019   HCT 41.1 09/23/2019   MCV 91 09/23/2019   PLT 266 09/23/2019   Lab Results  Component Value Date   ALT 12 09/23/2019   AST 18 09/23/2019   ALKPHOS 77 09/23/2019   BILITOT 0.4 09/23/2019     Review of Systems  Constitutional: Negative.  Negative for chills, diaphoresis, fatigue, fever and unexpected weight change.  HENT: Positive for congestion, ear pain and sinus pressure. Negative for ear discharge, hoarse voice, rhinorrhea, sneezing and sore throat.   Eyes: Negative for photophobia, pain, discharge, redness and itching.  Respiratory: Negative for cough, shortness of breath, wheezing and stridor.   Gastrointestinal: Negative for abdominal pain, blood in  stool, constipation, diarrhea, nausea and vomiting.  Endocrine: Negative for cold intolerance, heat intolerance, polydipsia, polyphagia and polyuria.  Genitourinary: Negative for dysuria, flank pain, frequency, hematuria, menstrual problem, pelvic pain, urgency, vaginal bleeding and vaginal discharge.  Musculoskeletal: Negative for arthralgias, back pain and myalgias.  Skin: Negative for rash.  Allergic/Immunologic: Negative for environmental allergies and food allergies.  Neurological: Negative for dizziness, weakness, light-headedness, numbness and headaches.  Hematological: Negative for adenopathy. Does not bruise/bleed easily.  Psychiatric/Behavioral: Negative for dysphoric mood. The patient is not nervous/anxious.     Patient Active Problem List   Diagnosis Date Noted  . Mixed hyperlipidemia 09/25/2019  . Gastroesophageal reflux disease 09/23/2019  . Encounter for screening colonoscopy   . Polyp of colon   . Bone spur of foot 03/19/2018  . Degenerative disc disease, cervical 11/18/2017  . Anxiety 06/21/2015  . History of renal cell cancer 06/21/2015  . Carpal tunnel syndrome 06/21/2015  . Essential (primary) hypertension 06/21/2015  . Idiopathic insomnia 06/21/2015    Allergies  Allergen Reactions  . Hydrocodone Itching    Past Surgical History:  Procedure Laterality Date  . ABDOMINAL HYSTERECTOMY    . APPENDECTOMY    . BACK SURGERY    . COLONOSCOPY WITH PROPOFOL N/A 08/28/2019   Procedure: COLONOSCOPY WITH PROPOFOL;  Surgeon: Virgel Manifold, MD;  Location: [facility] ENDOSCOPY;  Service: Endoscopy;  Laterality: N/A;  . cryoablation for renal cell carcinoma  2012  .  JOINT REPLACEMENT    . LUMBAR FUSION    . MEDIAL PARTIAL KNEE REPLACEMENT Left   . PARTIAL HYSTERECTOMY    . TONSILLECTOMY    . VENTRAL HERNIA REPAIR  2012   Dr. Genevive Bi    Social History   Tobacco Use  . Smoking status: Never Smoker  . Smokeless tobacco: Never Used  Vaping Use  . Vaping Use: Never  used  Substance Use Topics  . Alcohol use: Yes    Alcohol/week: 0.0 standard drinks    Comment: socially  . Drug use: No     Medication list has been reviewed and updated.  Current Meds  Medication Sig  . baclofen (LIORESAL) 10 MG tablet Take 1 tablet (10 mg total) by mouth 3 (three) times daily. (Patient taking differently: Take 10 mg by mouth as needed. )  . FLUoxetine (PROZAC) 20 MG capsule TAKE 1 CAPSULE BY MOUTH  DAILY  . fluticasone (FLONASE) 50 MCG/ACT nasal spray Place 2 sprays into both nostrils daily.  . hydrocortisone (ANUSOL-HC) 2.5 % rectal cream Place 1 application rectally 2 (two) times daily. (Patient taking differently: Place 1 application rectally as needed. )  . meloxicam (MOBIC) 15 MG tablet Take 15 mg by mouth daily.  Marland Kitchen omeprazole (PRILOSEC) 20 MG capsule Take 1 capsule (20 mg total) by mouth 2 (two) times daily before a meal. (Patient taking differently: Take 20 mg by mouth as needed. )  . psyllium (METAMUCIL) 58.6 % packet Take 1 packet by mouth as needed.   . triamterene-hydrochlorothiazide (MAXZIDE-25) 37.5-25 MG tablet Take 1 tablet by mouth daily.  Marland Kitchen zolpidem (AMBIEN) 10 MG tablet Take 1 tablet (10 mg total) by mouth at bedtime.    PHQ 2/9 Scores 06/15/2020 05/24/2020 04/18/2020 03/10/2020  PHQ - 2 Score 0 0 3 0  PHQ- 9 Score 0 0 12 0    GAD 7 : Generalized Anxiety Score 06/15/2020 05/24/2020 03/10/2020 09/23/2019  Nervous, Anxious, on Edge 0 3 2 0  Control/stop worrying 0 2 3 0  Worry too much - different things 0 2 3 2   Trouble relaxing 0 0 2 3  Restless 0 0 2 2  Easily annoyed or irritable 0 0 2 0  Afraid - awful might happen 0 1 3 0  Total GAD 7 Score 0 8 17 7   Anxiety Difficulty Not difficult at all Not difficult at all Very difficult Somewhat difficult    BP Readings from Last 3 Encounters:  06/15/20 128/78  05/24/20 122/72  04/18/20 108/72    Physical Exam Vitals and nursing note reviewed.  Constitutional:      Appearance: She is  well-developed.  HENT:     Head: Normocephalic.     Right Ear: External ear normal.     Left Ear: External ear normal.  Eyes:     General: Lids are everted, no foreign bodies appreciated. No scleral icterus.       Left eye: No foreign body or hordeolum.     Conjunctiva/sclera: Conjunctivae normal.     Right eye: Right conjunctiva is not injected.     Left eye: Left conjunctiva is not injected.     Pupils: Pupils are equal, round, and reactive to light.  Neck:     Thyroid: No thyromegaly.     Vascular: No JVD.     Trachea: No tracheal deviation.  Cardiovascular:     Rate and Rhythm: Normal rate and regular rhythm.     Heart sounds: Normal heart sounds. No murmur  heard.  No friction rub. No gallop.   Pulmonary:     Effort: Pulmonary effort is normal. No respiratory distress.     Breath sounds: Normal breath sounds. No wheezing or rales.  Abdominal:     General: Bowel sounds are normal.     Palpations: Abdomen is soft. There is no mass.     Tenderness: There is no abdominal tenderness. There is no guarding or rebound.  Musculoskeletal:        General: No tenderness. Normal range of motion.     Cervical back: Normal range of motion and neck supple.  Lymphadenopathy:     Cervical: No cervical adenopathy.  Skin:    General: Skin is warm.     Findings: No rash.  Neurological:     Mental Status: She is alert and oriented to person, place, and time.     Cranial Nerves: No cranial nerve deficit.     Deep Tendon Reflexes: Reflexes normal.  Psychiatric:        Mood and Affect: Mood is not anxious or depressed.     Wt Readings from Last 3 Encounters:  06/15/20 212 lb (96.2 kg)  05/24/20 209 lb (94.8 kg)  04/18/20 205 lb 6.4 oz (93.2 kg)    BP 128/78   Pulse 94   Temp 98.8 F (37.1 C) (Oral)   Ht 5\' 7"  (1.702 m)   Wt 212 lb (96.2 kg)   SpO2 96%   BMI 33.20 kg/m    Assessment and Plan: 1. Acute non-recurrent maxillary sinusitis Acute.  Persistent.  Stable.  Patient has  discomfort left maxillary area as well as retro-orbital.  Patient has nasal discharge that is purulent which is consistent with sinusitis.  Will treat with azithromycin to 50 mg 2 today followed by 1 a day for 4 days.  2. Bell's palsy Patient was noted by daughter to have possible facial weakness that patient attributed to swelling in the face.  With further evaluation it was noted to be a ptosis of the left eye and patient had asymmetrical facial weakness when she smiled.  This is consistent with patient having a Bell's palsy of a mild nature.  She was started on prednisone taper 40 mg.  Patient is to return if there is progression or continuance of symptoms.  3. Eustachian tube dysfunction, bilateral Bilateral eustachian tube retractions.  This is consistent with eustachian tube dysfunction patient's been instructed along with Flonase that she can take some Afrin for 3 to 4-day.  To reduce nasal congestion and eustachian tube mucous membrane swelling.

## 2020-06-20 ENCOUNTER — Telehealth: Payer: Self-pay | Admitting: Internal Medicine

## 2020-06-20 NOTE — Telephone Encounter (Signed)
Pt called back in to follow up, advised pt of message below per provider. Pt says that her Z- Pak was for 5 days. Pt says that the left side of her face feels really puffy. Pt says that she has been taking ibuprofen to help with the facial discomfort and would like to know if it is okay to take pain med with prednisone?   CB: [phone]

## 2020-06-20 NOTE — Telephone Encounter (Signed)
Called pt let her know that she could take ibuprofen to help with facial discomfort. Also explain to pt that after zpack it  will still stay in your system for a few days afterwards. Pt verbalized understanding.  KP

## 2020-06-20 NOTE — Telephone Encounter (Signed)
Zpak lasts for 10 days so need to wait until the 14th to make any change to antibiotics.  The prednisone is also probably helping the mucus be expelled so that is beneficial.

## 2020-06-20 NOTE — Telephone Encounter (Signed)
Copied from [place] [id number]. Topic: General - Other >> Jun 20, 2020  9:24 AM Keene Breath wrote: Reason for CRM: Patient would like the nurse to call her regarding her sinus infection.  She has been taking antibiotics and has finished them.  She still has a lot of green mucus and would like to know if she needs more.  CB# [phone]

## 2020-06-21 ENCOUNTER — Ambulatory Visit: Payer: Medicare Other | Admitting: Internal Medicine

## 2020-06-23 ENCOUNTER — Encounter: Payer: Self-pay | Admitting: Internal Medicine

## 2020-06-23 ENCOUNTER — Emergency Department: Payer: Medicare Other

## 2020-06-23 ENCOUNTER — Encounter: Payer: Self-pay | Admitting: Physician Assistant

## 2020-06-23 ENCOUNTER — Emergency Department
Admission: EM | Admit: 2020-06-23 | Discharge: 2020-06-23 | Disposition: A | Discharge status: Home / Self Care | Payer: Medicare Other | Attending: Emergency Medicine | Admitting: Emergency Medicine

## 2020-06-23 ENCOUNTER — Other Ambulatory Visit: Payer: Self-pay

## 2020-06-23 DIAGNOSIS — J0100 Acute maxillary sinusitis, unspecified: Secondary | ICD-10-CM | POA: Diagnosis not present

## 2020-06-23 DIAGNOSIS — J321 Chronic frontal sinusitis: Secondary | ICD-10-CM | POA: Diagnosis not present

## 2020-06-23 DIAGNOSIS — J0120 Acute ethmoidal sinusitis, unspecified: Secondary | ICD-10-CM | POA: Diagnosis not present

## 2020-06-23 DIAGNOSIS — I10 Essential (primary) hypertension: Secondary | ICD-10-CM | POA: Insufficient documentation

## 2020-06-23 DIAGNOSIS — R29810 Facial weakness: Secondary | ICD-10-CM | POA: Diagnosis not present

## 2020-06-23 DIAGNOSIS — J3489 Other specified disorders of nose and nasal sinuses: Secondary | ICD-10-CM | POA: Diagnosis not present

## 2020-06-23 DIAGNOSIS — R519 Headache, unspecified: Secondary | ICD-10-CM | POA: Diagnosis not present

## 2020-06-23 DIAGNOSIS — I7090 Unspecified atherosclerosis: Secondary | ICD-10-CM | POA: Diagnosis not present

## 2020-06-23 DIAGNOSIS — K047 Periapical abscess without sinus: Secondary | ICD-10-CM

## 2020-06-23 DIAGNOSIS — Z85520 Personal history of malignant carcinoid tumor of kidney: Secondary | ICD-10-CM | POA: Insufficient documentation

## 2020-06-23 DIAGNOSIS — J0110 Acute frontal sinusitis, unspecified: Secondary | ICD-10-CM | POA: Diagnosis not present

## 2020-06-23 LAB — CBC WITH DIFFERENTIAL/PLATELET
Abs Immature Granulocytes: 0.03 10*3/uL (ref 0.00–0.07)
Basophils Absolute: 0 10*3/uL (ref 0.0–0.1)
Basophils Relative: 0 %
Eosinophils Absolute: 0 10*3/uL (ref 0.0–0.5)
Eosinophils Relative: 0 %
HCT: 38.1 % (ref 36.0–46.0)
Hemoglobin: 12.8 g/dL (ref 12.0–15.0)
Immature Granulocytes: 0 %
Lymphocytes Relative: 27 %
Lymphs Abs: 2.1 10*3/uL (ref 0.7–4.0)
MCH: 30.5 pg (ref 26.0–34.0)
MCHC: 33.6 g/dL (ref 30.0–36.0)
MCV: 90.7 fL (ref 80.0–100.0)
Monocytes Absolute: 0.5 10*3/uL (ref 0.1–1.0)
Monocytes Relative: 7 %
Neutro Abs: 5 10*3/uL (ref 1.7–7.7)
Neutrophils Relative %: 66 %
Platelets: 317 10*3/uL (ref 150–400)
RBC: 4.2 MIL/uL (ref 3.87–5.11)
RDW: 11.5 % (ref 11.5–15.5)
WBC: 7.6 10*3/uL (ref 4.0–10.5)
nRBC: 0 % (ref 0.0–0.2)

## 2020-06-23 LAB — BASIC METABOLIC PANEL
Anion gap: 13 (ref 5–15)
BUN: 11 mg/dL (ref 8–23)
CO2: 26 mmol/L (ref 22–32)
Calcium: 8.9 mg/dL (ref 8.9–10.3)
Chloride: 100 mmol/L (ref 98–111)
Creatinine, Ser: 0.74 mg/dL (ref 0.44–1.00)
GFR calc Af Amer: >60 mL/min (ref >60)
GFR calc non Af Amer: >60 mL/min (ref >60)
Glucose, Bld: 107 mg/dL — ABNORMAL HIGH (ref 70–99)
Potassium: 3.6 mmol/L (ref 3.5–5.1)
Sodium: 139 mmol/L (ref 135–145)

## 2020-06-23 MED ORDER — TRAMADOL HCL 50 MG PO TABS
50.0000 mg | ORAL_TABLET | Freq: Once | ORAL | Status: AC
  Administered 2020-06-23: 50 mg via ORAL
  Filled 2020-06-23: qty 1

## 2020-06-23 MED ORDER — ACETAMINOPHEN-CODEINE #3 300-30 MG PO TABS: 1.0000 | ORAL_TABLET | Freq: Three times a day (TID) | ORAL | 0 refills | Status: DC | PRN

## 2020-06-23 MED ORDER — LIDOCAINE-EPINEPHRINE 2 %-1:100000 IJ SOLN
1.7000 mL | Freq: Once | INTRAMUSCULAR | Status: AC
  Administered 2020-06-23: 1.7 mL
  Filled 2020-06-23: qty 1.7

## 2020-06-23 MED ORDER — AMOXICILLIN-POT CLAVULANATE 875-125 MG PO TABS: 1.0000 | ORAL_TABLET | Freq: Two times a day (BID) | ORAL | 0 refills | Status: AC

## 2020-06-23 MED ORDER — KETOROLAC TROMETHAMINE 30 MG/ML IJ SOLN
15.0000 mg | Freq: Once | INTRAMUSCULAR | Status: AC
  Administered 2020-06-23: 15 mg via INTRAVENOUS
  Filled 2020-06-23: qty 1

## 2020-06-23 MED ORDER — IOHEXOL 300 MG/ML  SOLN
75.0000 mL | Freq: Once | INTRAMUSCULAR | Status: AC | PRN
  Administered 2020-06-23: 75 mL via INTRAVENOUS
  Filled 2020-06-23: qty 75

## 2020-06-23 MED ORDER — PIPERACILLIN-TAZOBACTAM 3.375 G IVPB 30 MIN
3.3750 g | Freq: Once | INTRAVENOUS | Status: AC
  Administered 2020-06-23: 3.375 g via INTRAVENOUS
  Filled 2020-06-23: qty 50

## 2020-06-23 NOTE — ED Provider Notes (Signed)
[facility] Emergency Department Provider Note ____________________________________________  Time seen: 13  I have reviewed the triage vital signs and the nursing notes.  HISTORY  Chief Complaint  Facial Pain  HPI Tracy Chase is a 64 y.o. female presents to the ED at the advice of her PCP. She has been treated over the last week with azithromycin and a prednisone taper for a presumed sinus infection. The patient reported left-sided facial pain and pressure. She also complained of purulent nasal drainage. She apparently presented to the PCP office last week when she experienced sudden facial weakness on the left, including an asymmetry of her smile and some lid lag. she denies a history of Bell's palsy, but was diagnosed and started on steroid. The called to office today to report continued pain, a "knot" under her left eye, and stabbing left facial pain. She denies any headaches, fevers, nausea, spontaneous drainage, or difficulty swallowing.   Past Medical History:  Diagnosis Date  . Cancer (HCC)    RENAL CELL  . Depression   . Insomnia     Patient Active Problem List   Diagnosis Date Noted  . Mixed hyperlipidemia 09/25/2019  . Gastroesophageal reflux disease 09/23/2019  . Encounter for screening colonoscopy   . Polyp of colon   . Bone spur of foot 03/19/2018  . Degenerative disc disease, cervical 11/18/2017  . Anxiety 06/21/2015  . History of renal cell cancer 06/21/2015  . Carpal tunnel syndrome 06/21/2015  . Essential (primary) hypertension 06/21/2015  . Idiopathic insomnia 06/21/2015    Past Surgical History:  Procedure Laterality Date  . ABDOMINAL HYSTERECTOMY    . APPENDECTOMY    . BACK SURGERY    . COLONOSCOPY WITH PROPOFOL N/A 08/28/2019   Procedure: COLONOSCOPY WITH PROPOFOL;  Surgeon: Virgel Manifold, MD;  Location: [facility] ENDOSCOPY;  Service: Endoscopy;  Laterality: N/A;  . cryoablation for renal cell carcinoma  2012  . JOINT  REPLACEMENT    . LUMBAR FUSION    . MEDIAL PARTIAL KNEE REPLACEMENT Left   . PARTIAL HYSTERECTOMY    . TONSILLECTOMY    . VENTRAL HERNIA REPAIR  2012   Dr. Genevive Bi    Prior to Admission medications   Medication Sig Start Date End Date Taking? Authorizing Provider  acetaminophen-codeine (TYLENOL #3) 300-30 MG tablet Take 1 tablet by mouth every 8 (eight) hours as needed for moderate pain. 06/23/20   Corbet Hanley, Dannielle Karvonen, PA-C  amoxicillin-clavulanate (AUGMENTIN) 875-125 MG tablet Take 1 tablet by mouth 2 (two) times daily for 10 days. 06/23/20 07/03/20  Oneil Behney, Dannielle Karvonen, PA-C  baclofen (LIORESAL) 10 MG tablet Take 1 tablet (10 mg total) by mouth 3 (three) times daily. Patient taking differently: Take 10 mg by mouth as needed.  03/10/20   Glean Hess, MD  FLUoxetine (PROZAC) 20 MG capsule TAKE 1 CAPSULE BY MOUTH  DAILY 09/14/19   Glean Hess, MD  fluticasone [facility]) 50 MCG/ACT nasal spray Place 2 sprays into both nostrils daily.    [provider]  hydrocortisone (ANUSOL-HC) 2.5 % rectal cream Place 1 application rectally 2 (two) times daily. Patient taking differently: Place 1 application rectally as needed.  12/23/19   Glean Hess, MD  meloxicam (MOBIC) 15 MG tablet Take 15 mg by mouth daily. 05/11/20   [provider]  omeprazole (PRILOSEC) 20 MG capsule TAKE 1 CAPSULE (20 MG TOTAL) BY MOUTH 2 (TWO) TIMES DAILY BEFORE A MEAL. 06/15/20   Glean Hess, MD  predniSONE (DELTASONE) 10 MG tablet Take 1 tablet (10 mg total) by mouth daily with breakfast. 4,4,4,3,3,3,2,2,2,1,1,1 06/15/20   Juline Patch, MD  psyllium (METAMUCIL) 58.6 % packet Take 1 packet by mouth as needed.     [provider]  triamterene-hydrochlorothiazide (MAXZIDE-25) 37.5-25 MG tablet Take 1 tablet by mouth daily. 03/10/20   Glean Hess, MD  zolpidem (AMBIEN) 10 MG tablet Take 1 tablet (10 mg total) by mouth at bedtime. 04/18/20 07/17/20  Glean Hess, MD     Allergies Hydrocodone  Family History  Problem Relation Age of Onset  . Kidney failure Mother   . Congestive Heart Failure Father   . Breast cancer Neg Hx     Social History Social History   Tobacco Use  . Smoking status: Never Smoker  . Smokeless tobacco: Never Used  Vaping Use  . Vaping Use: Never used  Substance Use Topics  . Alcohol use: Yes    Alcohol/week: 0.0 standard drinks    Comment: socially  . Drug use: No    Review of Systems  Constitutional: Negative for fever. Eyes: Negative for visual changes. ENT: Negative for sore throat. Left facial pain and swelling. Cardiovascular: Negative for chest pain. Respiratory: Negative for shortness of breath. Gastrointestinal: Negative for abdominal pain, vomiting and diarrhea. Musculoskeletal: Negative for back pain. Negative jaw pain Skin: Negative for rash. Neurological: Negative for headaches, focal weakness or numbness. ____________________________________________  PHYSICAL EXAM:  VITAL SIGNS: ED Triage Vitals  Enc Vitals Group     BP 06/23/20 1748 133/73     Pulse Rate 06/23/20 1748 71     Resp 06/23/20 1748 18     Temp 06/23/20 1748 98.3 F (36.8 C)     Temp Source 06/23/20 1748 Oral     SpO2 06/23/20 1748 100 %     Weight 06/23/20 1755 212 lb (96.2 kg)     Height 06/23/20 1755 5\' 6"  (1.676 m)     Head Circumference --      Peak Flow --      Pain Score 06/23/20 1755 10     Pain Loc --      Pain Edu? --      Excl. in [place]? --     Constitutional: Alert and oriented. Well appearing and in no distress. Head: Normocephalic and atraumatic. Subtle fullness to the left cheek just below the zygomatic arch, when compared to the right. Tenderness and focal firmness to the subcutaneous skin of the same area.  Eyes: Conjunctivae are normal. PERRL. Normal extraocular movements Ears: Canals clear. TMs intact bilaterally. Nose: No congestion/rhinorrhea/epistaxis. Mouth/Throat: Mucous membranes are moist. Uvula  is midline and tonsils are flat. No oral lesions noted. Patient does have some buccal swelling and bulging of the upper gumline at 2nd premolar/1st molar Neck: Supple. No thyromegaly. Hematological/Lymphatic/Immunological: No cervical lymphadenopathy. Cardiovascular: Normal rate, regular rhythm. Normal distal pulses. Respiratory: Normal respiratory effort. No wheezes/rales/rhonchi. Musculoskeletal: Nontender with normal range of motion in all extremities.  Neurologic: CN II-XII grossly intact. No facial drop or asymmetry noted.  Normal gait without ataxia. Normal speech and language. No gross focal neurologic deficits are appreciated. Skin:  Skin is warm, dry and intact. No rash noted. Psychiatric: Mood and affect are normal. Patient exhibits appropriate insight and judgment. ____________________________________________   LABS (pertinent positives/negatives) Labs Reviewed  BASIC METABOLIC PANEL - Abnormal; Notable for the following components:      Result Value   Glucose, Bld 107 (*)    All other  components within normal limits  CBC WITH DIFFERENTIAL/PLATELET   ____________________________________________   RADIOLOGY  CT Head w/o CM IMPRESSION: 1. Normal for age noncontrast CT appearance of the brain. 2. Partially visible paranasal sinus inflammation, see also Face CT reported separately.  CT Maxillofacial w/ CM IMPRESSION: Suspected phlegmon and possible abscess formation along the posterior left maxillary alveolus, which is in proximity to a decayed anterior left maxillary premolar as described above.  Near total opacification of left frontal, anterior ethmoid, and maxillary sinuses with occlusion of drainage pathways. May reflect acute sinusitis in the appropriate setting. ____________________________________________  PROCEDURES  Zosyn 3.375 mg IVP Toradol 15 mg PO Tramadol 15 mg PO  .Marland KitchenIncision and Drainage  Date/Time: 06/23/2020 11:00 PM Performed by: Melvenia Needles, PA-C Authorized by: Melvenia Needles, PA-C   Consent:    Consent obtained:  Verbal   Consent given by:  Patient   Risks discussed:  Pain and bleeding   Alternatives discussed:  No treatment Location:    Type:  Abscess   Location:  Mouth   Mouth location:  Alveolar process Anesthesia (see MAR for exact dosages):    Anesthesia method:  Local infiltration   Local anesthetic:  Lidocaine 2% WITH epi Procedure type:    Complexity:  Simple Procedure details:    Incision types:  Stab incision   Incision depth:  Submucosal   Scalpel blade:  11   Drainage:  Purulent   Drainage amount:  Moderate   Wound treatment:  Wound left open   Packing materials:  None Post-procedure details:    Patient tolerance of procedure:  Tolerated well, no immediate complications   ____________________________________________  INITIAL IMPRESSION / ASSESSMENT AND PLAN / ED COURSE  DDX: sinusitis, facial abscess, dental abscess, intracranial mass  Patient with ED evaluation of a weeks complaint of increasing left-sided facial pain and pressure as well as some swelling to the face.  Patient was evaluated after she failed outpatient therapy with azithromycin.  She was found to have near complete occlusion of her left-sided frontal, ethmoid, sphenoid, and maxillary sinuses.  She also found to have a focal dental abscess in the left upper jaw.  Patient was treated with an IV dose of Zosyn and a local I&D procedure was performed.  Pain medicines have been given patient reports significant improvement of her symptoms at the time of this disposition.  She will be discharged with a prescription for Augmentin as well as Tylenol with codeine to take as prescribed.  She will complete the steroid as previously ordered.  She is encouraged to follow-up with her primary provider or [facility] for ongoing symptoms.  Return precautions have been discussed and verbalized as understood.  Tracy Chase was  evaluated in Emergency Department on 06/23/2020 for the symptoms described in the history of present illness. She was evaluated in the context of the global COVID-19 pandemic, which necessitated consideration that the patient might be at risk for infection with the SARS-CoV-2 virus that causes COVID-19. Institutional protocols and algorithms that pertain to the evaluation of patients at risk for COVID-19 are in a state of rapid change based on information released by regulatory bodies including the CDC and federal and state organizations. These policies and algorithms were followed during the patient's care in the ED.  I reviewed the patient's prescription history over the last 12 months in the multi-state controlled substances database(s) that includes [place], [place], [place], [place], [place], [place], [place], [place], [place], [place], [place], [place], [place], and [place].  Results were notable for monthly zolpidem RX.  ____________________________________________  FINAL CLINICAL IMPRESSION(S) / ED DIAGNOSES  Final diagnoses:  Dental abscess  Acute non-recurrent frontal sinusitis  Acute non-recurrent ethmoidal sinusitis  Acute non-recurrent maxillary sinusitis      Joshuah Minella, Dannielle Karvonen, PA-C 06/23/20 2325    Duffy Bruce, MD 06/30/20 1200

## 2020-06-23 NOTE — Discharge Instructions (Addendum)
You are being treated for a severe sinusitis and a dental abscess. Take the prescription meds as directed. Rinse & gargle with warm-salty water. Return to the ED for worsening symptoms. Complete the steroid as previously prescribed.

## 2020-06-23 NOTE — ED Notes (Signed)
See triage note  Presents with swelling and increased pain to left side of face  States recently treated for sinus infection  Finished her Z-pak  But still taking prednisone  No fever

## 2020-06-23 NOTE — ED Triage Notes (Signed)
Pt states was diagnosed with sinus infection and given zithromax and steriod but is not feeling better. Pt states she feels facial pain. Pt appears in no acute distress.

## 2020-06-29 DIAGNOSIS — J0101 Acute recurrent maxillary sinusitis: Secondary | ICD-10-CM | POA: Diagnosis not present

## 2020-06-29 DIAGNOSIS — K046 Periapical abscess with sinus: Secondary | ICD-10-CM | POA: Diagnosis not present

## 2020-06-29 DIAGNOSIS — J320 Chronic maxillary sinusitis: Secondary | ICD-10-CM | POA: Diagnosis not present

## 2020-07-06 ENCOUNTER — Other Ambulatory Visit: Payer: Self-pay | Admitting: Internal Medicine

## 2020-07-06 DIAGNOSIS — F419 Anxiety disorder, unspecified: Secondary | ICD-10-CM

## 2020-07-19 ENCOUNTER — Other Ambulatory Visit: Payer: Self-pay | Admitting: Internal Medicine

## 2020-07-19 DIAGNOSIS — F419 Anxiety disorder, unspecified: Secondary | ICD-10-CM

## 2020-07-19 MED ORDER — FLUOXETINE HCL 20 MG PO CAPS: 20.0000 mg | ORAL_CAPSULE | Freq: Every day | ORAL | 1 refills | Status: DC

## 2020-08-11 DIAGNOSIS — J320 Chronic maxillary sinusitis: Secondary | ICD-10-CM | POA: Diagnosis not present

## 2020-08-19 ENCOUNTER — Other Ambulatory Visit: Admission: RE | Admit: 2020-08-19 | Payer: Medicare Other | Source: Ambulatory Visit

## 2020-08-31 ENCOUNTER — Other Ambulatory Visit: Payer: Self-pay

## 2020-08-31 ENCOUNTER — Encounter: Payer: Self-pay | Admitting: Otolaryngology

## 2020-08-31 NOTE — Discharge Instructions (Signed)
[facility] ENDOSCOPIC SINUS SURGERY [facility]  What is Functional Endoscopic Sinus Surgery?  The Surgery involves making the natural openings of the sinuses larger by removing the bony partitions that separate the sinuses from the nasal cavity.  The natural sinus lining is preserved as much as possible to allow the sinuses to resume normal function after the surgery.  In some patients nasal polyps (excessively swollen lining of the sinuses) may be removed to relieve obstruction of the sinus openings.  The surgery is performed through the nose using lighted scopes, which eliminates the need for incisions on the face.  A septoplasty is a different procedure which is sometimes performed with sinus surgery.  It involves straightening the boy partition that separates the two sides of your nose.  A crooked or deviated septum may need repair if is obstructing the sinuses or nasal airflow.  Turbinate reduction is also often performed during sinus surgery.  The turbinates are bony proturberances from the side walls of the nose which swell and can obstruct the nose in patients with sinus and allergy problems.  Their size can be surgically reduced to help relieve nasal obstruction.  What Can Sinus Surgery Do For Me?  Sinus surgery can reduce the frequency of sinus infections requiring antibiotic treatment.  This can provide improvement in nasal congestion, post-nasal drainage, facial pressure and nasal obstruction.  Surgery will NOT prevent you from ever having an infection again, so it usually only for patients who get infections 4 or more times yearly requiring antibiotics, or for infections that do not clear with antibiotics.  It will not cure nasal allergies, so patients with allergies may still require medication to treat their allergies after surgery. Surgery may improve headaches related to sinusitis, however, some people will continue to  require medication to control sinus headaches related to allergies.  Surgery will do nothing for other forms of headache (migraine, tension or cluster).  What Are the Risks of Endoscopic Sinus Surgery?  Current techniques allow surgery to be performed safely with little risk, however, there are rare complications that patients should be aware of.  Because the sinuses are located around the eyes, there is risk of eye injury, including blindness, though again, this would be quite rare. This is usually a result of bleeding behind the eye during surgery, which puts the vision oat risk, though there are treatments to protect the vision and prevent permanent disrupted by surgery causing a leak of the spinal fluid that surrounds the brain.  More serious complications would include bleeding inside the brain cavity or damage to the brain.  Again, all of these complications are uncommon, and spinal fluid leaks can be safely managed surgically if they occur.  The most common complication of sinus surgery is bleeding from the nose, which may require packing or cauterization of the nose.  Continued sinus have polyps may experience recurrence of the polyps requiring revision surgery.  Alterations of sense of smell or injury to the tear ducts are also rare complications.   What is the Surgery Like, and what is the Recovery?  The Surgery usually takes a couple of hours to perform, and is usually performed under a general anesthetic (completely asleep).  Patients are usually discharged home after a couple of hours.  Sometimes during surgery it is necessary to pack the nose to control bleeding, and the packing is left in place for 24 - 48 hours, and removed by your surgeon.    If a septoplasty was performed during the procedure, there is often a splint placed which must be removed after 5-7 days.   Discomfort: Pain is usually mild to moderate, and can be controlled by prescription pain medication or acetaminophen (Tylenol).   Aspirin, Ibuprofen (Advil, Motrin), or Naprosyn (Aleve) should be avoided, as they can cause increased bleeding.  Most patients feel sinus pressure like they have a bad head cold for several days.  Sleeping with your head elevated can help reduce swelling and facial pressure, as can ice packs over the face.  A humidifier may be helpful to keep the mucous and blood from drying in the nose.   Diet: There are no specific diet restrictions, however, you should generally start with clear liquids and a light diet of bland foods because the anesthetic can cause some nausea.  Advance your diet depending on how your stomach feels.  Taking your pain medication with food will often help reduce stomach upset which pain medications can cause.  Nasal Saline Irrigation: It is important to remove blood clots and dried mucous from the nose as it is healing.  This is done by having you irrigate the nose at least 3 - 4 times daily with a salt water solution.  We recommend using NeilMed Sinus Rinse (available at the drug store).  Fill the squeeze bottle with the solution, bend over a sink, and insert the tip of the squeeze bottle into the nose  of an inch.  Point the tip of the squeeze bottle towards the inside corner of the eye on the same side your irrigating.  Squeeze the bottle and gently irrigate the nose.  If you bend forward as you do this, most of the fluid will flow back out of the nose, instead of down your throat.   The solution should be warm, near body temperature, when you irrigate.   Each time you irrigate, you should use a full squeeze bottle.   Note that if you are instructed to use Nasal Steroid Sprays at any time after your surgery, irrigate with saline BEFORE using the steroid spray, so you do not wash it all out of the nose. Another product, Nasal Saline Gel (such as AYR Nasal Saline Gel) can be applied in each nostril 3 - 4 times daily to moisture the nose and reduce scabbing or crusting.  Bleeding:   Bloody drainage from the nose can be expected for several days, and patients are instructed to irrigate their nose frequently with salt water to help remove mucous and blood clots.  The drainage may be dark red or brown, though some fresh blood may be seen intermittently, especially after irrigation.  Do not blow you nose, as bleeding may occur. If you must sneeze, keep your mouth open to allow air to escape through your mouth.  If heavy bleeding occurs: Irrigate the nose with saline to rinse out clots, then spray the nose 3 - 4 times with Afrin Nasal Decongestant Spray.  The spray will constrict the blood vessels to slow bleeding.  Pinch the lower half of your nose shut to apply pressure, and lay down with your head elevated.  Ice packs over the nose may help as well. If bleeding persists despite these measures, you should notify your doctor.  Do not use the Afrin routinely to control nasal congestion after surgery, as it can result in worsening congestion and may affect healing.     Activity: Return to work varies among patients. Most patients will be   out of work at least 5 - 7 days to recover.  Patient may return to work after they are off of narcotic pain medication, and feeling well enough to perform the functions of their job.  Patients must avoid heavy lifting (over 10 pounds) or strenuous physical for 2 weeks after surgery, so your employer may need to assign you to light duty, or keep you out of work longer if light duty is not possible.  NOTE: you should not drive, operate dangerous machinery, do any mentally demanding tasks or make any important legal or financial decisions while on narcotic pain medication and recovering from the general anesthetic.    Call Your Doctor Immediately if You Have Any of the Following: 1. Bleeding that you cannot control with the above measures 2. Loss of vision, double vision, bulging of the eye or black eyes. 3. Fever over 101 degrees 4. Neck stiffness with  severe headache, fever, nausea and change in mental state. You are always encourage to call anytime with concerns, however, please call with requests for pain medication refills during office hours.  Office Endoscopy: During follow-up visits your doctor will remove any packing or splints that may have been placed and evaluate and clean your sinuses endoscopically.  Topical anesthetic will be used to make this as comfortable as possible, though you may want to take your pain medication prior to the visit.  How often this will need to be done varies from patient to patient.  After complete recovery from the surgery, you may need follow-up endoscopy from time to time, particularly if there is concern of recurrent infection or nasal polyps.  General Anesthesia, Adult, Care After This sheet gives you information about how to care for yourself after your procedure. Your health care provider may also give you more specific instructions. If you have problems or questions, contact your health care provider. What can I expect after the procedure? After the procedure, the following side effects are common:  Pain or discomfort at the IV site.  Nausea.  Vomiting.  Sore throat.  Trouble concentrating.  Feeling cold or chills.  Weak or tired.  Sleepiness and fatigue.  Soreness and body aches. These side effects can affect parts of the body that were not involved in surgery. Follow these instructions at home:  For at least 24 hours after the procedure:  Have a responsible adult stay with you. It is important to have someone help care for you until you are awake and alert.  Rest as needed.  Do not: ? Participate in activities in which you could fall or become injured. ? Drive. ? Use heavy machinery. ? Drink alcohol. ? Take sleeping pills or medicines that cause drowsiness. ? Make important decisions or sign legal documents. ? Take care of children on your own. Eating and drinking  Follow  any instructions from your health care provider about eating or drinking restrictions.  When you feel hungry, start by eating small amounts of foods that are soft and easy to digest (bland), such as toast. Gradually return to your regular diet.  Drink enough fluid to keep your urine pale yellow.  If you vomit, rehydrate by drinking water, juice, or clear broth. General instructions  If you have sleep apnea, surgery and certain medicines can increase your risk for breathing problems. Follow instructions from your health care provider about wearing your sleep device: ? Anytime you are sleeping, including during daytime naps. ? While taking prescription pain medicines, sleeping medicines, or medicines that   make you drowsy.  Return to your normal activities as told by your health care provider. Ask your health care provider what activities are safe for you.  Take over-the-counter and prescription medicines only as told by your health care provider.  If you smoke, do not smoke without supervision.  Keep all follow-up visits as told by your health care provider. This is important. Contact a health care provider if:  You have nausea or vomiting that does not get better with medicine.  You cannot eat or drink without vomiting.  You have pain that does not get better with medicine.  You are unable to pass urine.  You develop a skin rash.  You have a fever.  You have redness around your IV site that gets worse. Get help right away if:  You have difficulty breathing.  You have chest pain.  You have blood in your urine or stool, or you vomit blood. Summary  After the procedure, it is common to have a sore throat or nausea. It is also common to feel tired.  Have a responsible adult stay with you for the first 24 hours after general anesthesia. It is important to have someone help care for you until you are awake and alert.  When you feel hungry, start by eating small amounts of  foods that are soft and easy to digest (bland), such as toast. Gradually return to your regular diet.  Drink enough fluid to keep your urine pale yellow.  Return to your normal activities as told by your health care provider. Ask your health care provider what activities are safe for you. This information is not intended to replace advice given to you by your health care provider. Make sure you discuss any questions you have with your health care provider. Document Revised: 11/01/2017 Document Reviewed: 06/14/2017 Elsevier Patient Education  2020 [employer]  

## 2020-09-02 ENCOUNTER — Other Ambulatory Visit: Payer: Self-pay

## 2020-09-02 ENCOUNTER — Other Ambulatory Visit
Admission: RE | Admit: 2020-09-02 | Discharge: 2020-09-02 | Disposition: A | Discharge status: Home / Self Care | Payer: Medicare Other | Source: Ambulatory Visit | Attending: Otolaryngology | Admitting: Otolaryngology

## 2020-09-02 DIAGNOSIS — Z20822 Contact with and (suspected) exposure to covid-19: Secondary | ICD-10-CM | POA: Insufficient documentation

## 2020-09-02 DIAGNOSIS — Z01812 Encounter for preprocedural laboratory examination: Secondary | ICD-10-CM | POA: Diagnosis not present

## 2020-09-02 NOTE — Anesthesia Preprocedure Evaluation (Addendum)
Anesthesia Evaluation  Patient identified by MRN, date of birth, ID band Patient awake    Reviewed: Allergy & Precautions, NPO status , Patient's Chart, lab work & pertinent test results, reviewed documented beta blocker date and time   History of Anesthesia Complications Negative for: history of anesthetic complications  Airway Mallampati: II  TM Distance: >3 FB Neck ROM: Limited    Dental   Pulmonary    breath sounds clear to auscultation       Cardiovascular hypertension, (-) angina(-) DOE  Rhythm:Regular Rate:Normal   HLD   Neuro/Psych PSYCHIATRIC DISORDERS Anxiety Depression  Neuromuscular disease (Carpal tunnel)    GI/Hepatic GERD  Controlled,  Endo/Other    Renal/GU Renal disease (Renal cell carcinoma)     Musculoskeletal  (+) Arthritis ,   Abdominal (+) + obese (BMI 31),   Peds  Hematology   Anesthesia Other Findings   Reproductive/Obstetrics                            Anesthesia Physical Anesthesia Plan  ASA: II  Anesthesia Plan: General   Post-op Pain Management:    Induction: Intravenous  PONV Risk Score and Plan: 3 and Treatment may vary due to age or medical condition, Ondansetron, Dexamethasone and Midazolam  Airway Management Planned: Oral ETT  Additional Equipment:   Intra-op Plan:   Post-operative Plan: Extubation in OR  Informed Consent: I have reviewed the patients History and Physical, chart, labs and discussed the procedure including the risks, benefits and alternatives for the proposed anesthesia with the patient or authorized representative who has indicated his/her understanding and acceptance.       Plan Discussed with: CRNA and Anesthesiologist  Anesthesia Plan Comments:         Anesthesia Quick Evaluation

## 2020-09-03 LAB — SARS CORONAVIRUS 2 (TAT 6-24 HRS): SARS Coronavirus 2: NEGATIVE

## 2020-09-06 ENCOUNTER — Encounter: Payer: Self-pay | Admitting: Otolaryngology

## 2020-09-06 ENCOUNTER — Ambulatory Visit: Payer: Medicare Other | Admitting: Anesthesiology

## 2020-09-06 ENCOUNTER — Encounter
Admission: RE | Disposition: A | Discharge status: Home / Self Care | Payer: Self-pay | Source: Home / Self Care | Attending: Otolaryngology

## 2020-09-06 ENCOUNTER — Ambulatory Visit
Admission: RE | Admit: 2020-09-06 | Discharge: 2020-09-06 | Disposition: A | Discharge status: Home / Self Care | Payer: Medicare Other | Attending: Otolaryngology | Admitting: Otolaryngology

## 2020-09-06 ENCOUNTER — Other Ambulatory Visit: Payer: Self-pay

## 2020-09-06 DIAGNOSIS — J321 Chronic frontal sinusitis: Secondary | ICD-10-CM | POA: Diagnosis not present

## 2020-09-06 DIAGNOSIS — J329 Chronic sinusitis, unspecified: Secondary | ICD-10-CM | POA: Insufficient documentation

## 2020-09-06 DIAGNOSIS — J322 Chronic ethmoidal sinusitis: Secondary | ICD-10-CM | POA: Diagnosis not present

## 2020-09-06 DIAGNOSIS — J320 Chronic maxillary sinusitis: Secondary | ICD-10-CM | POA: Diagnosis not present

## 2020-09-06 DIAGNOSIS — J323 Chronic sphenoidal sinusitis: Secondary | ICD-10-CM | POA: Diagnosis not present

## 2020-09-06 HISTORY — PX: MAXILLARY ANTROSTOMY: SHX2003

## 2020-09-06 HISTORY — PX: SPHENOIDECTOMY: SHX2421

## 2020-09-06 HISTORY — PX: FRONTAL SINUS EXPLORATION: SHX6591

## 2020-09-06 HISTORY — PX: IMAGE GUIDED SINUS SURGERY: SHX6570

## 2020-09-06 HISTORY — PX: ETHMOIDECTOMY: SHX5197

## 2020-09-06 SURGERY — SINUS SURGERY, WITH IMAGING GUIDANCE
Anesthesia: General | Site: Nose

## 2020-09-06 MED ORDER — EPHEDRINE SULFATE 50 MG/ML IJ SOLN
INTRAMUSCULAR | Status: DC | PRN
  Administered 2020-09-06: 10 mg via INTRAVENOUS

## 2020-09-06 MED ORDER — CLINDAMYCIN HCL 300 MG PO CAPS: 300.0000 mg | ORAL_CAPSULE | Freq: Four times a day (QID) | ORAL | 0 refills | Status: DC

## 2020-09-06 MED ORDER — DEXAMETHASONE SODIUM PHOSPHATE 4 MG/ML IJ SOLN
INTRAMUSCULAR | Status: DC | PRN
  Administered 2020-09-06: 10 mg via INTRAVENOUS

## 2020-09-06 MED ORDER — OXYCODONE-ACETAMINOPHEN 5-325 MG PO TABS
1.0000 | ORAL_TABLET | Freq: Four times a day (QID) | ORAL | 0 refills | Status: DC | PRN
Start: 2020-09-06 — End: 2020-10-12

## 2020-09-06 MED ORDER — OXYCODONE HCL 5 MG/5ML PO SOLN: 5.0000 mg | Freq: Once | ORAL | Status: AC | PRN

## 2020-09-06 MED ORDER — OXYCODONE HCL 5 MG PO TABS
5.0000 mg | ORAL_TABLET | Freq: Once | ORAL | Status: AC | PRN
  Administered 2020-09-06: 5 mg via ORAL

## 2020-09-06 MED ORDER — HYDROMORPHONE HCL 1 MG/ML IJ SOLN
0.2500 mg | INTRAMUSCULAR | Status: DC | PRN
  Administered 2020-09-06: 0.25 mg via INTRAVENOUS
  Administered 2020-09-06: 0.5 mg via INTRAVENOUS

## 2020-09-06 MED ORDER — LACTATED RINGERS IV SOLN: INTRAVENOUS | Status: DC

## 2020-09-06 MED ORDER — SUCCINYLCHOLINE CHLORIDE 20 MG/ML IJ SOLN
INTRAMUSCULAR | Status: DC | PRN
  Administered 2020-09-06: 80 mg via INTRAVENOUS

## 2020-09-06 MED ORDER — ONDANSETRON HCL 4 MG/2ML IJ SOLN
INTRAMUSCULAR | Status: DC | PRN
  Administered 2020-09-06: 4 mg via INTRAVENOUS

## 2020-09-06 MED ORDER — FENTANYL CITRATE (PF) 100 MCG/2ML IJ SOLN
INTRAMUSCULAR | Status: DC | PRN
  Administered 2020-09-06: 25 ug via INTRAVENOUS
  Administered 2020-09-06: 50 ug via INTRAVENOUS
  Administered 2020-09-06 (×3): 25 ug via INTRAVENOUS
  Administered 2020-09-06: 50 ug via INTRAVENOUS

## 2020-09-06 MED ORDER — ACETAMINOPHEN 10 MG/ML IV SOLN
1000.0000 mg | Freq: Once | INTRAVENOUS | Status: AC
  Administered 2020-09-06: 1000 mg via INTRAVENOUS

## 2020-09-06 MED ORDER — OXYMETAZOLINE HCL 0.05 % NA SOLN
NASAL | Status: DC | PRN
  Administered 2020-09-06: 1 via TOPICAL

## 2020-09-06 MED ORDER — LIDOCAINE HCL (CARDIAC) PF 100 MG/5ML IV SOSY
PREFILLED_SYRINGE | INTRAVENOUS | Status: DC | PRN
  Administered 2020-09-06: 50 mg via INTRAVENOUS

## 2020-09-06 MED ORDER — GLYCOPYRROLATE 0.2 MG/ML IJ SOLN
INTRAMUSCULAR | Status: DC | PRN
  Administered 2020-09-06: .1 mg via INTRAVENOUS

## 2020-09-06 MED ORDER — LIDOCAINE-EPINEPHRINE 1 %-1:100000 IJ SOLN
INTRAMUSCULAR | Status: DC | PRN
  Administered 2020-09-06: 5 mL

## 2020-09-06 MED ORDER — MIDAZOLAM HCL 5 MG/5ML IJ SOLN
INTRAMUSCULAR | Status: DC | PRN
  Administered 2020-09-06: 2 mg via INTRAVENOUS

## 2020-09-06 MED ORDER — PROPOFOL 10 MG/ML IV BOLUS
INTRAVENOUS | Status: DC | PRN
  Administered 2020-09-06: 120 mg via INTRAVENOUS

## 2020-09-06 MED ORDER — PROMETHAZINE HCL 25 MG/ML IJ SOLN: 6.2500 mg | INTRAMUSCULAR | Status: DC | PRN

## 2020-09-06 SURGICAL SUPPLY — 20 items
DRESSING NASL FOAM PST OP SINU (MISCELLANEOUS) IMPLANT
DRSG NASAL FOAM POST OP SINU (MISCELLANEOUS) ×4
ELECT REM PT RETURN 9FT ADLT (ELECTROSURGICAL) ×4
ELECTRODE REM PT RTRN 9FT ADLT (ELECTROSURGICAL) ×3 IMPLANT
GLOVE BIO SURGEON STRL SZ7.5 (GLOVE) ×8 IMPLANT
GOWN STRL REUS W/ TWL LRG LVL3 (GOWN DISPOSABLE) ×3 IMPLANT
GOWN STRL REUS W/TWL LRG LVL3 (GOWN DISPOSABLE) ×4
IV NS 500ML (IV SOLUTION) ×4
IV NS 500ML BAXH (IV SOLUTION) ×3 IMPLANT
KIT TURNOVER KIT A (KITS) ×4 IMPLANT
NS IRRIG 500ML POUR BTL (IV SOLUTION) ×4 IMPLANT
PACK ENT CUSTOM (PACKS) ×4 IMPLANT
PACKING NASAL EPIS 4X2.4 XEROG (MISCELLANEOUS) ×1 IMPLANT
PATTIES SURGICAL .5 X3 (DISPOSABLE) ×4 IMPLANT
SHAVER DIEGO BLD STD TYPE A (BLADE) ×4 IMPLANT
SOL ANTI-FOG 6CC FOG-OUT (MISCELLANEOUS) ×3 IMPLANT
SOL FOG-OUT ANTI-FOG 6CC (MISCELLANEOUS) ×1
SYR 10ML LL (SYRINGE) ×4 IMPLANT
TUBING DECLOG MULTIDEBRIDER (TUBING) ×4 IMPLANT
WATER STERILE IRR 250ML POUR (IV SOLUTION) ×1 IMPLANT

## 2020-09-06 NOTE — Op Note (Signed)
09/06/2020  11:42 AM    Tracy Chase  [id number]   Pre-Op Diagnosis:  chronic sinusitis Post-op Diagnosis: chronic sinusitis  Procedure:  1)  Image Guided Sinus Surgery,   2)  Left Endoscopic Maxillary Antrostomy with Tissue Removal   3)  Left Frontal recess exploration   4)  Left Total Ethmoidectomy   5)  Left Sphenoidotomy with tissue removal    Surgeon:  Riley Nearing  Anesthesia:  General endotracheal  EBL:  794IA  Complications:  None  Findings: Pus in maxillary and ethmoid sinuses with severe mucosal inflammation  Procedure: After the patient was identified in holding and the benefits of the procedure were reviewed as well as the consent and risks, the patient was taken to the operating room and with the patient in a comfortable supine position,  general orotracheal anesthesia was induced without difficulty.  A proper time-out was performed.  The Stryker image guidance system was set up and calibrated in the normal fashion and felt to be acceptable.  Next 1% Xylocaine with 1:100,000 epinephrine was infiltrated into the inferior turbinates, septum, and anterior middle turbinates bilaterally.  Several minutes were allowed for this to take effect.  Cottoniod pledgets soaked in Afrin were placed into both nasal cavities and left while the patient was prepped and draped in the standard fashion. The image guided suction was calibrated and used to inspect known points in the nasal cavity to assess accuracy of the image guided system. Accuracy was felt to be excellent.   The left middle turbinate was medialized and the uncinate process then resected with through-cutting forceps as well as the microdebrider. In this fashion the uncinate was completely removed along with soft tissue and bone of the medial wall of the maxillary sinus to create a large patent maxillary antrostomy. Cultures were taken of pus in the sinus. The left maxillary sinus was irrigated and suctioned to clear  secretions.   Next the left anterior ethmoid sinuses were dissected beginning inferomedially, entering the ethmoid bulla. Thru cut forceps were used to open the anterior ethmoids. The microdebrider was used as needed to trim loose mucosal edges.  Next the basal lamella was entered and, working back in a sequential fashion through the ethmoid air cells, the ethmoid sinuses were dissected to the posterior ethmoid sinuses, utilizing the image guided suction and the whole time to reassess the anatomy frequently. Thru cutting forceps were used for this dissection. Care was taken to avoid injury to the lamina papyracea laterally and the skull base superiorly.   Next the scope was passed medial to the middle turbinate and the left sphenoid recess inspected. With the assistance of the image guided system, the sphenoid sinus was carefully entered through some thin bone at the anterior face of the spenoid, medial to the superior turbinate, and widely opened with through-cutting sphenoid punch forceps. Mucus was suctioned from the sphenoid sinus.  Dissection proceeded anteriorly and superiorly into the left frontal recess which was dissected utilizing a 30 and then a 70 scope and frontal curved instruments. The curved image guided suction was used during this dissection to frequently reassess the anatomy on the CT scan. The frontal recess was dissected until widely opened. The region leading to the os was widely opened though the bone was quite dense around the region of the os, and the os itself was not enlarged further due to the bone density.  Stammberger absorbable sinus packing was then placed in the ethmoid cavity followed by Xerogel absorbable  packing.  The patient was then returned to the anesthesiologist for awakening and taken to recovery room in good condition postoperatively.  Disposition:   PACU and d/c home  Plan: Ice, elevation, narcotic analgesia and prophylactic antibiotics. Begin sinus  irrigations with saline tomrrow, irrigating 3-4 times daily. Return to the office in 7 days.  Return to work in 7-10 days, no strenuous activities for two weeks.   Riley Nearing 09/06/2020 11:42 AM

## 2020-09-06 NOTE — Anesthesia Postprocedure Evaluation (Signed)
Anesthesia Post Note  Patient: Tracy Chase  Procedure(s) Performed: IMAGE GUIDED SINUS SURGERY (N/A Nose) FRONTAL SINUS EXPLORATION (Left Nose) TOTAL ETHMOIDECTOMY (Left ) MAXILLARY ANTROSTOMY WITH TISSUE (Left ) SPHENOIDECTOMY (Left )     Patient location during evaluation: PACU Anesthesia Type: General Level of consciousness: awake and alert Pain management: pain level controlled Vital Signs Assessment: post-procedure vital signs reviewed and stable Respiratory status: spontaneous breathing, nonlabored ventilation, respiratory function stable and patient connected to nasal cannula oxygen Cardiovascular status: blood pressure returned to baseline and stable Postop Assessment: no apparent nausea or vomiting Anesthetic complications: no   No complications documented.  Malessa Zartman A  Whitney Bingaman

## 2020-09-06 NOTE — Transfer of Care (Signed)
Immediate Anesthesia Transfer of Care Note  Patient: Tracy Chase  Procedure(s) Performed: IMAGE GUIDED SINUS SURGERY (N/A Nose) FRONTAL SINUS EXPLORATION (Left Nose) TOTAL ETHMOIDECTOMY (Left ) MAXILLARY ANTROSTOMY WITH TISSUE (Left ) SPHENOIDECTOMY (Left )  Patient Location: PACU  Anesthesia Type: General  Level of Consciousness: awake, alert  and patient cooperative  Airway and Oxygen Therapy: Patient Spontanous Breathing and Patient connected to supplemental oxygen  Post-op Assessment: Post-op Vital signs reviewed, Patient's Cardiovascular Status Stable, Respiratory Function Stable, Patent Airway and No signs of Nausea or vomiting  Post-op Vital Signs: Reviewed and stable  Complications: No complications documented.

## 2020-09-06 NOTE — Anesthesia Procedure Notes (Signed)
Procedure Name: Intubation Date/Time: 09/06/2020 8:37 AM Performed by: Mayme Genta, CRNA Pre-anesthesia Checklist: Patient identified, Emergency Drugs available, Suction available, Patient being monitored and Timeout performed Patient Re-evaluated:Patient Re-evaluated prior to induction Oxygen Delivery Method: Circle system utilized Preoxygenation: Pre-oxygenation with 100% oxygen Induction Type: IV induction Ventilation: Mask ventilation without difficulty Laryngoscope Size: Miller and 2 Grade View: Grade I Tube type: Oral Rae Tube size: 7.0 mm Number of attempts: 1 Placement Confirmation: ETT inserted through vocal cords under direct vision,  positive ETCO2 and breath sounds checked- equal and bilateral Tube secured with: Tape Dental Injury: Teeth and Oropharynx as per pre-operative assessment

## 2020-09-06 NOTE — H&P (Signed)
History and physical reviewed and will be scanned in later. No change in medical status reported by the patient or family, appears stable for surgery. All questions regarding the procedure answered, and patient (or family if a child) expressed understanding of the procedure. ? ?Tracy Chase S Madasyn Heath ?@TODAY@ ?

## 2020-09-07 LAB — SURGICAL PATHOLOGY

## 2020-09-08 ENCOUNTER — Encounter: Payer: Self-pay | Admitting: Otolaryngology

## 2020-09-11 LAB — AEROBIC/ANAEROBIC CULTURE W GRAM STAIN (SURGICAL/DEEP WOUND): Culture: NORMAL

## 2020-09-12 DIAGNOSIS — J320 Chronic maxillary sinusitis: Secondary | ICD-10-CM | POA: Diagnosis not present

## 2020-09-19 ENCOUNTER — Other Ambulatory Visit: Payer: Self-pay | Admitting: Internal Medicine

## 2020-09-19 DIAGNOSIS — Z1231 Encounter for screening mammogram for malignant neoplasm of breast: Secondary | ICD-10-CM

## 2020-09-23 DIAGNOSIS — J320 Chronic maxillary sinusitis: Secondary | ICD-10-CM | POA: Diagnosis not present

## 2020-09-27 ENCOUNTER — Encounter: Payer: Medicare Other | Admitting: Internal Medicine

## 2020-10-12 ENCOUNTER — Encounter: Payer: Self-pay | Admitting: Internal Medicine

## 2020-10-12 ENCOUNTER — Other Ambulatory Visit: Payer: Self-pay

## 2020-10-12 ENCOUNTER — Ambulatory Visit (INDEPENDENT_AMBULATORY_CARE_PROVIDER_SITE_OTHER): Payer: Medicare Other | Admitting: Internal Medicine

## 2020-10-12 VITALS — BP 126/82 | HR 68 | Temp 97.8°F | Ht 67.0 in | Wt 200.0 lb

## 2020-10-12 DIAGNOSIS — Z0000 Encounter for general adult medical examination without abnormal findings: Secondary | ICD-10-CM

## 2020-10-12 DIAGNOSIS — E782 Mixed hyperlipidemia: Secondary | ICD-10-CM | POA: Diagnosis not present

## 2020-10-12 DIAGNOSIS — F411 Generalized anxiety disorder: Secondary | ICD-10-CM | POA: Diagnosis not present

## 2020-10-12 DIAGNOSIS — F5101 Primary insomnia: Secondary | ICD-10-CM

## 2020-10-12 DIAGNOSIS — Z23 Encounter for immunization: Secondary | ICD-10-CM | POA: Diagnosis not present

## 2020-10-12 DIAGNOSIS — R0789 Other chest pain: Secondary | ICD-10-CM

## 2020-10-12 DIAGNOSIS — I10 Essential (primary) hypertension: Secondary | ICD-10-CM | POA: Diagnosis not present

## 2020-10-12 DIAGNOSIS — J320 Chronic maxillary sinusitis: Secondary | ICD-10-CM | POA: Diagnosis not present

## 2020-10-12 LAB — POCT URINALYSIS DIPSTICK
Bilirubin, UA: NEGATIVE
Blood, UA: NEGATIVE
Glucose, UA: NEGATIVE
Ketones, UA: NEGATIVE
Leukocytes, UA: NEGATIVE
Nitrite, UA: NEGATIVE
Protein, UA: NEGATIVE
Spec Grav, UA: <=1.005 — AB (ref 1.010–1.025)
Urobilinogen, UA: 0.2 E.U./dL
pH, UA: 6.5 (ref 5.0–8.0)

## 2020-10-12 MED ORDER — SHINGRIX 50 MCG/0.5ML IM SUSR: 0.5000 mL | Freq: Once | INTRAMUSCULAR | 1 refills | Status: AC

## 2020-10-12 MED ORDER — ZOLPIDEM TARTRATE 10 MG PO TABS: 10.0000 mg | ORAL_TABLET | Freq: Every day | ORAL | 1 refills | Status: DC

## 2020-10-12 NOTE — Progress Notes (Signed)
Date:  10/12/2020   Name:  Tracy Chase   DOB:  06/07/1956   MRN:  [id number]   Chief Complaint: Annual Exam (breast exam no pap)  Tracy Chase is a 64 y.o. female who presents today for her Complete Annual Exam. She feels well. She reports exercising none. She reports she is sleeping fairly well. Breast complaints left breast hurts, pain comes and goes has been happening for years.  Mammogram: scheduled for 10/31/20 DEXA: 10/2019 normal Pap smear: discontinued Colonoscopy: 08/2019 repeat 2025  Immunization History  Administered Date(s) Administered  . Fluad Quad(high Dose 65+) 09/02/2019  . Influenza Inj Mdck Quad Pf 08/18/2018  . Influenza,inj,Quad PF,6+ Mos 10/02/2016, 09/03/2017  . Influenza-Unspecified 08/26/2020  . PFIZER SARS-COV-2 Vaccination 02/08/2020, 03/01/2020  . Pneumococcal Conjugate-13 09/23/2019  . Tdap 04/29/2012    Hypertension This is a chronic problem. The problem is controlled. Associated symptoms include anxiety and chest pain (but not with exertion). Pertinent negatives include no headaches, palpitations or shortness of breath. Past treatments include diuretics. The current treatment provides significant improvement. There is no history of kidney disease, CAD/MI or CVA.  Anxiety Presents for follow-up (sx worse since husbands MI and CABG x5 in September) visit. Symptoms include chest pain (but not with exertion), insomnia and nervous/anxious behavior. Patient reports no dizziness, palpitations or shortness of breath. Symptoms occur most days. The quality of sleep is fair.   Compliance with medications is 76-100% (prozac).  Chest Pain  This is a new problem. The current episode started more than 1 month ago. The onset quality is undetermined. The problem has been unchanged. The pain is present in the substernal region. The pain is mild. The quality of the pain is described as tightness. The pain does not radiate. Pertinent negatives include no abdominal  pain, cough, dizziness, fever, headaches, palpitations, shortness of breath or vomiting. The pain is aggravated by nothing. She has tried nothing for the symptoms.  Her past medical history is significant for hypertension.  Insomnia Primary symptoms: no sleep disturbance.  The problem occurs nightly. The problem is unchanged. The symptoms are aggravated by anxiety. Past treatments include medication. The treatment provided moderate relief. Prior diagnostic workup includes:  No prior workup.    Lab Results  Component Value Date   CREATININE 0.74 06/23/2020   BUN 11 06/23/2020   NA 139 06/23/2020   K 3.6 06/23/2020   CL 100 06/23/2020   CO2 26 06/23/2020   Lab Results  Component Value Date   CHOL 247 (H) 09/23/2019   HDL 93 09/23/2019   LDLCALC 135 (H) 09/23/2019   TRIG 111 09/23/2019   CHOLHDL 2.7 09/23/2019   Lab Results  Component Value Date   TSH 4.220 09/23/2019   No results found for: HGBA1C Lab Results  Component Value Date   WBC 7.6 06/23/2020   HGB 12.8 06/23/2020   HCT 38.1 06/23/2020   MCV 90.7 06/23/2020   PLT 317 06/23/2020   Lab Results  Component Value Date   ALT 12 09/23/2019   AST 18 09/23/2019   ALKPHOS 77 09/23/2019   BILITOT 0.4 09/23/2019     Review of Systems  Constitutional: Negative for chills, fatigue and fever.  HENT: Negative for congestion, hearing loss, tinnitus, trouble swallowing and voice change.   Eyes: Negative for visual disturbance.  Respiratory: Negative for cough, chest tightness, shortness of breath and wheezing.   Cardiovascular: Positive for chest pain (but not with exertion). Negative for palpitations and leg swelling.  Gastrointestinal: Negative for abdominal pain, constipation, diarrhea and vomiting.  Endocrine: Negative for polydipsia and polyuria.  Genitourinary: Negative for dysuria, frequency, genital sores, vaginal bleeding and vaginal discharge.  Musculoskeletal: Negative for arthralgias, gait problem and joint  swelling.  Skin: Negative for color change and rash.  Neurological: Negative for dizziness, tremors, light-headedness and headaches.  Hematological: Negative for adenopathy. Does not bruise/bleed easily.  Psychiatric/Behavioral: Negative for dysphoric mood and sleep disturbance. The patient is nervous/anxious and has insomnia.     Patient Active Problem List   Diagnosis Date Noted  . Mixed hyperlipidemia 09/25/2019  . Gastroesophageal reflux disease 09/23/2019  . Encounter for screening colonoscopy   . Polyp of colon   . Bone spur of foot 03/19/2018  . Degenerative disc disease, cervical 11/18/2017  . Generalized anxiety disorder 06/21/2015  . History of renal cell cancer 06/21/2015  . Carpal tunnel syndrome 06/21/2015  . Essential (primary) hypertension 06/21/2015  . Idiopathic insomnia 06/21/2015    Allergies  Allergen Reactions  . Hydrocodone Itching    Past Surgical History:  Procedure Laterality Date  . ABDOMINAL HYSTERECTOMY    . APPENDECTOMY    . BACK SURGERY     "rods in back"  . COLONOSCOPY WITH PROPOFOL N/A 08/28/2019   Procedure: COLONOSCOPY WITH PROPOFOL;  Surgeon: Virgel Manifold, MD;  Location: [facility];  Service: Endoscopy;  Laterality: N/A;  . cryoablation for renal cell carcinoma  2012  . ETHMOIDECTOMY Left 09/06/2020   Procedure: TOTAL ETHMOIDECTOMY;  Surgeon: Clyde Canterbury, MD;  Location: [place];  Service: ENT;  Laterality: Left;  . FRONTAL SINUS EXPLORATION Left 09/06/2020   Procedure: FRONTAL SINUS EXPLORATION;  Surgeon: Clyde Canterbury, MD;  Location: [place];  Service: ENT;  Laterality: Left;  . IMAGE GUIDED SINUS SURGERY N/A 09/06/2020   Procedure: IMAGE GUIDED SINUS SURGERY;  Surgeon: Clyde Canterbury, MD;  Location: [place];  Service: ENT;  Laterality: N/A;  need stryker disk PLACE DISK ON OR CHARGE NURSE DESK 10-01 KP  . JOINT REPLACEMENT    . LUMBAR FUSION    . MAXILLARY ANTROSTOMY Left 09/06/2020    Procedure: MAXILLARY ANTROSTOMY WITH TISSUE;  Surgeon: Clyde Canterbury, MD;  Location: [place];  Service: ENT;  Laterality: Left;  Marland Kitchen MEDIAL PARTIAL KNEE REPLACEMENT Left   . PARTIAL HYSTERECTOMY    . SPHENOIDECTOMY Left 09/06/2020   Procedure: SPHENOIDECTOMY;  Surgeon: Clyde Canterbury, MD;  Location: [place];  Service: ENT;  Laterality: Left;  . TONSILLECTOMY    . VENTRAL HERNIA REPAIR  2012   Dr. Genevive Bi    Social History   Tobacco Use  . Smoking status: Never Smoker  . Smokeless tobacco: Never Used  . Tobacco comment: was "social Smoker" over 15 yrs ago  Vaping Use  . Vaping Use: Never used  Substance Use Topics  . Alcohol use: Yes    Alcohol/week: 0.0 standard drinks    Comment: socially 1-2X/mo  . Drug use: No     Medication list has been reviewed and updated.  Current Meds  Medication Sig  . FLUoxetine (PROZAC) 20 MG capsule Take 1 capsule (20 mg total) by mouth daily.  Marland Kitchen triamterene-hydrochlorothiazide (MAXZIDE-25) 37.5-25 MG tablet Take 1 tablet by mouth daily.  Marland Kitchen UNABLE TO FIND Med Name: Serovital  . zolpidem (AMBIEN) 10 MG tablet Take 1 tablet (10 mg total) by mouth at bedtime.    PHQ 2/9 Scores 10/12/2020 06/15/2020 05/24/2020 04/18/2020  PHQ - 2 Score 0 0 0 3  PHQ- 9 Score 0 0 0 12    GAD 7 : Generalized Anxiety Score 10/12/2020 06/15/2020 05/24/2020 03/10/2020  Nervous, Anxious, on Edge 0 0 3 2  Control/stop worrying 2 0 2 3  Worry too much - different things 2 0 2 3  Trouble relaxing 0 0 0 2  Restless 0 0 0 2  Easily annoyed or irritable 1 0 0 2  Afraid - awful might happen 3 0 1 3  Total GAD 7 Score 8 0 8 17  Anxiety Difficulty - Not difficult at all Not difficult at all Very difficult    BP Readings from Last 3 Encounters:  10/12/20 126/82  09/06/20 129/69  06/23/20 128/68    Physical Exam Vitals and nursing note reviewed.  Constitutional:      General: She is not in acute distress.    Appearance: She is well-developed.  HENT:      Head: Normocephalic and atraumatic.     Right Ear: Tympanic membrane and ear canal normal.     Left Ear: Tympanic membrane and ear canal normal.     Nose:     Right Sinus: No maxillary sinus tenderness.     Left Sinus: No maxillary sinus tenderness.  Eyes:     General: No scleral icterus.       Right eye: No discharge.        Left eye: No discharge.     Conjunctiva/sclera: Conjunctivae normal.  Neck:     Thyroid: No thyromegaly.     Vascular: No carotid bruit.  Cardiovascular:     Rate and Rhythm: Normal rate and regular rhythm.     Pulses: Normal pulses.     Heart sounds: Normal heart sounds.  Pulmonary:     Effort: Pulmonary effort is normal. No respiratory distress.     Breath sounds: No wheezing.  Chest:     Chest wall: No mass, swelling or tenderness.     Breasts:        Right: No mass, nipple discharge, skin change or tenderness.        Left: No mass, nipple discharge, skin change or tenderness.  Abdominal:     General: Bowel sounds are normal.     Palpations: Abdomen is soft.     Tenderness: There is no abdominal tenderness.  Musculoskeletal:     Cervical back: Normal range of motion. No erythema.     Right lower leg: No edema.     Left lower leg: No edema.  Lymphadenopathy:     Cervical: No cervical adenopathy.  Skin:    General: Skin is warm and dry.     Findings: No rash.  Neurological:     Mental Status: She is alert and oriented to person, place, and time.     Cranial Nerves: No cranial nerve deficit.     Sensory: No sensory deficit.     Deep Tendon Reflexes: Reflexes are normal and symmetric.  Psychiatric:        Attention and Perception: Attention normal.        Mood and Affect: Mood is depressed.        Speech: Speech normal.        Thought Content: Thought content does not include suicidal ideation. Thought content does not include suicidal plan.        Judgment: Judgment normal.     Wt Readings from Last 3 Encounters:  10/12/20 200 lb (90.7 kg)    09/06/20 198 lb 1.6 oz (89.9  kg)  06/23/20 212 lb (96.2 kg)    BP 126/82   Pulse 68   Temp 97.8 F (36.6 C) (Oral)   Ht 5\' 7"  (1.702 m)   Wt 200 lb (90.7 kg)   SpO2 98%   BMI 31.32 kg/m   Assessment and Plan: 1. Annual physical exam Exam is normal except for weight. Encourage regular exercise and appropriate dietary changes. - POCT urinalysis dipstick  2. Essential (primary) hypertension Clinically stable exam with well controlled BP on hctz. Tolerating medications without side effects at this time. Pt to continue current regimen and low sodium diet; benefits of regular exercise as able discussed. - CBC with Differential/Platelet - Comprehensive metabolic panel - TSH  3. Generalized anxiety disorder Continue Prozac Sx slightly worse since husbands surgery - however he is does well , losing weight and exercising in cardiac rehab.  4. Mixed hyperlipidemia Will check labs and advise - Lipid panel  5. Need for vaccination for pneumococcus - Pneumococcal polysaccharide vaccine 23-valent greater than or equal to 2yo subcutaneous/IM  6. Atypical chest pain EKG normal last year Symptoms not typical for angina but will refer for stress testing at cardiology discretion - Ambulatory referral to Cardiology  7. Need for shingles vaccine Pt can obtain at her local pharmacy - Zoster Vaccine Adjuvanted [facility]) injection; Inject 0.5 mLs into the muscle once for 1 dose.  Dispense: 0.5 mL; Refill: 1  8. Idiopathic insomnia The problem of recurrent insomnia is discussed. Avoidance of caffeine sources is strongly encouraged. Sleep hygiene issues are reviewed. The use of sedative hypnotics for temporary relief is appropriate - no evidence of misuse noted. - zolpidem (AMBIEN) 10 MG tablet; Take 1 tablet (10 mg total) by mouth at bedtime.  Dispense: 90 tablet; Refill: 1   Partially dictated using Editor, commissioning. Any errors are unintentional.  Halina Maidens, MD [facility]  10/12/2020

## 2020-10-13 LAB — COMPREHENSIVE METABOLIC PANEL
ALT: 14 IU/L (ref 0–32)
AST: 19 IU/L (ref 0–40)
Albumin/Globulin Ratio: 1.9 (ref 1.2–2.2)
Albumin: 4.2 g/dL (ref 3.8–4.8)
Alkaline Phosphatase: 69 IU/L (ref 44–121)
BUN/Creatinine Ratio: 13 (ref 12–28)
BUN: 8 mg/dL (ref 8–27)
Bilirubin Total: 0.4 mg/dL (ref 0.0–1.2)
CO2: 24 mmol/L (ref 20–29)
Calcium: 9.1 mg/dL (ref 8.7–10.3)
Chloride: 96 mmol/L (ref 96–106)
Creatinine, Ser: 0.62 mg/dL (ref 0.57–1.00)
GFR calc Af Amer: 109 mL/min/{1.73_m2} (ref >59)
GFR calc non Af Amer: 94 mL/min/{1.73_m2} (ref >59)
Globulin, Total: 2.2 g/dL (ref 1.5–4.5)
Glucose: 93 mg/dL (ref 65–99)
Potassium: 3.7 mmol/L (ref 3.5–5.2)
Sodium: 137 mmol/L (ref 134–144)
Total Protein: 6.4 g/dL (ref 6.0–8.5)

## 2020-10-13 LAB — TSH: TSH: 1.65 u[IU]/mL (ref 0.450–4.500)

## 2020-10-13 LAB — CBC WITH DIFFERENTIAL/PLATELET
Basophils Absolute: 0 10*3/uL (ref 0.0–0.2)
Basos: 1 %
EOS (ABSOLUTE): 0.1 10*3/uL (ref 0.0–0.4)
Eos: 1 %
Hematocrit: 38.9 % (ref 34.0–46.6)
Hemoglobin: 13.3 g/dL (ref 11.1–15.9)
Immature Grans (Abs): 0 10*3/uL (ref 0.0–0.1)
Immature Granulocytes: 0 %
Lymphocytes Absolute: 2.7 10*3/uL (ref 0.7–3.1)
Lymphs: 52 %
MCH: 30.6 pg (ref 26.6–33.0)
MCHC: 34.2 g/dL (ref 31.5–35.7)
MCV: 89 fL (ref 79–97)
Monocytes Absolute: 0.4 10*3/uL (ref 0.1–0.9)
Monocytes: 7 %
Neutrophils Absolute: 2 10*3/uL (ref 1.4–7.0)
Neutrophils: 39 %
Platelets: 223 10*3/uL (ref 150–450)
RBC: 4.35 x10E6/uL (ref 3.77–5.28)
RDW: 12 % (ref 11.7–15.4)
WBC: 5.2 10*3/uL (ref 3.4–10.8)

## 2020-10-13 LAB — LIPID PANEL
Chol/HDL Ratio: 2.4 ratio (ref 0.0–4.4)
Cholesterol, Total: 232 mg/dL — ABNORMAL HIGH (ref 100–199)
HDL: 96 mg/dL (ref >39)
LDL Chol Calc (NIH): 125 mg/dL — ABNORMAL HIGH (ref 0–99)
Triglycerides: 67 mg/dL (ref 0–149)
VLDL Cholesterol Cal: 11 mg/dL (ref 5–40)

## 2020-10-14 ENCOUNTER — Encounter: Payer: Self-pay | Admitting: Cardiology

## 2020-10-14 ENCOUNTER — Ambulatory Visit (INDEPENDENT_AMBULATORY_CARE_PROVIDER_SITE_OTHER): Payer: Medicare Other | Admitting: Cardiology

## 2020-10-14 ENCOUNTER — Other Ambulatory Visit: Payer: Self-pay

## 2020-10-14 VITALS — BP 120/78 | HR 60 | Ht 67.0 in | Wt 201.0 lb

## 2020-10-14 DIAGNOSIS — R072 Precordial pain: Secondary | ICD-10-CM | POA: Diagnosis not present

## 2020-10-14 DIAGNOSIS — E7800 Pure hypercholesterolemia, unspecified: Secondary | ICD-10-CM

## 2020-10-14 DIAGNOSIS — I10 Essential (primary) hypertension: Secondary | ICD-10-CM

## 2020-10-14 MED ORDER — METOPROLOL TARTRATE 100 MG PO TABS: 100.0000 mg | ORAL_TABLET | Freq: Once | ORAL | 0 refills | Status: DC

## 2020-10-14 NOTE — Progress Notes (Signed)
Cardiology Office Note:    Date:  10/14/2020   ID:  Tracy, Chase [birth year], MRN [id number]  PCP:  Tracy Hess, MD  [facility] Cardiologist:  Tracy Sable, MD  [place] Electrophysiologist:  None   Referring MD: Tracy Hess, MD   Chief Complaint  Patient presents with  . New Patient (Initial Visit)    Referred by Dr. Army Melia for evaluation of chest pain; Meds verbally reviewed with patient.    History of Present Illness:    Tracy Chase is a 64 y.o. female with a hx of hypertension who presents due to chest pain.  Patient states having worsening symptoms of chest pain over the past 3 months.  Described chest pain as tightness, located in the central part of her chest.  Symptoms are not related with exertion.  Symptoms are 5 out of 10 in severity.  Patient states symptoms may be related to stress as her husband recently had a heart attack.  She takes her medications as prescribed for blood pressure.  Blood pressures typically controlled.  Denies edema, palpitations, shortness of breath.  Has a family history of MI in both parents, mom was 56 when she had her first MI.  Past Medical History:  Diagnosis Date  . Cancer (HCC)    RENAL CELL  . Depression   . Insomnia     Past Surgical History:  Procedure Laterality Date  . ABDOMINAL HYSTERECTOMY    . APPENDECTOMY    . BACK SURGERY     "rods in back"  . COLONOSCOPY WITH PROPOFOL N/A 08/28/2019   Procedure: COLONOSCOPY WITH PROPOFOL;  Surgeon: Tracy Manifold, MD;  Location: [facility];  Service: Endoscopy;  Laterality: N/A;  . cryoablation for renal cell carcinoma  2012  . ETHMOIDECTOMY Left 09/06/2020   Procedure: TOTAL ETHMOIDECTOMY;  Surgeon: Tracy Canterbury, MD;  Location: [place];  Service: ENT;  Laterality: Left;  . FRONTAL SINUS EXPLORATION Left 09/06/2020   Procedure: FRONTAL SINUS EXPLORATION;  Surgeon: Tracy Canterbury, MD;  Location: [place];  Service: ENT;   Laterality: Left;  . IMAGE GUIDED SINUS SURGERY N/A 09/06/2020   Procedure: IMAGE GUIDED SINUS SURGERY;  Surgeon: Tracy Canterbury, MD;  Location: [place];  Service: ENT;  Laterality: N/A;  need stryker disk PLACE DISK ON OR CHARGE NURSE DESK 10-01 KP  . JOINT REPLACEMENT    . LUMBAR FUSION    . MAXILLARY ANTROSTOMY Left 09/06/2020   Procedure: MAXILLARY ANTROSTOMY WITH TISSUE;  Surgeon: Tracy Canterbury, MD;  Location: [place];  Service: ENT;  Laterality: Left;  Marland Kitchen MEDIAL PARTIAL KNEE REPLACEMENT Left   . PARTIAL HYSTERECTOMY    . SPHENOIDECTOMY Left 09/06/2020   Procedure: SPHENOIDECTOMY;  Surgeon: Tracy Canterbury, MD;  Location: [place];  Service: ENT;  Laterality: Left;  . TONSILLECTOMY    . VENTRAL HERNIA REPAIR  2012   Dr. Genevive Chase    Current Medications: Current Meds  Medication Sig  . FLUoxetine (PROZAC) 20 MG capsule Take 1 capsule (20 mg total) by mouth daily.  Marland Kitchen triamterene-hydrochlorothiazide (MAXZIDE-25) 37.5-25 MG tablet Take 1 tablet by mouth daily.  Marland Kitchen UNABLE TO FIND Med Name: Serovital  . zolpidem (AMBIEN) 10 MG tablet Take 1 tablet (10 mg total) by mouth at bedtime.     Allergies:   Hydrocodone   Social History   Socioeconomic History  . Marital status: Significant Other    Spouse name: Not on file  . Number of children:  1  . Years of education: Not on file  . Highest education level: Not on file  Occupational History  . Not on file  Tobacco Use  . Smoking status: Never Smoker  . Smokeless tobacco: Never Used  . Tobacco comment: was "social Smoker" over 15 yrs ago  Vaping Use  . Vaping Use: Never used  Substance and Sexual Activity  . Alcohol use: Yes    Alcohol/week: 0.0 standard drinks    Comment: socially 1-2X/mo  . Drug use: No  . Sexual activity: Not on file  Other Topics Concern  . Not on file  Social History Narrative   Pt has 1 son and 2 step daughters. Lives with significant other   Social Determinants of Health    Financial Resource Strain: Low Risk   . Difficulty of Paying Living Expenses: Not hard at all  Food Insecurity: No Food Insecurity  . Worried About Charity fundraiser in the Last Year: Never true  . Ran Out of Food in the Last Year: Never true  Transportation Needs: No Transportation Needs  . Lack of Transportation (Medical): No  . Lack of Transportation (Non-Medical): No  Physical Activity: Insufficiently Active  . Days of Exercise per Week: 4 days  . Minutes of Exercise per Session: 30 min  Stress: Stress Concern Present  . Feeling of Stress : Rather much  Social Connections: Moderately Isolated  . Frequency of Communication with Friends and Family: More than three times a week  . Frequency of Social Gatherings with Friends and Family: Once a week  . Attends Religious Services: Never  . Active Member of Clubs or Organizations: No  . Attends Archivist Meetings: Never  . Marital Status: Living with partner     Family History: The patient's family history includes Congestive Heart Failure in her father; Kidney failure in her mother. There is no history of Breast cancer.  ROS:   Please see the history of present illness.     All other systems reviewed and are negative.  EKGs/Labs/Other Studies Reviewed:    The following studies were reviewed today:   EKG:  EKG is  ordered today.  The ekg ordered today demonstrates normal sinus rhythm, normal ECG.  Recent Labs: 10/12/2020: ALT 14; BUN 8; Creatinine, Ser 0.62; Hemoglobin 13.3; Platelets 223; Potassium 3.7; Sodium 137; TSH 1.650  Recent Lipid Panel    Component Value Date/Time   CHOL 232 (H) 10/12/2020 0854   TRIG 67 10/12/2020 0854   HDL 96 10/12/2020 0854   CHOLHDL 2.4 10/12/2020 0854   LDLCALC 125 (H) 10/12/2020 0854     Risk Assessment/Calculations:      Physical Exam:    VS:  BP 120/78 (BP Location: Right Arm, Patient Position: Sitting, Cuff Size: Large)   Pulse 60   Ht 5' 7"  (1.702 m)   Wt 201  lb (91.2 kg)   SpO2 98%   BMI 31.48 kg/m     Wt Readings from Last 3 Encounters:  10/14/20 201 lb (91.2 kg)  10/12/20 200 lb (90.7 kg)  09/06/20 198 lb 1.6 oz (89.9 kg)     GEN:  Well nourished, well developed in no acute distress HEENT: Normal NECK: No JVD; No carotid bruits LYMPHATICS: No lymphadenopathy CARDIAC: RRR, no murmurs, rubs, gallops RESPIRATORY:  Clear to auscultation without rales, wheezing or rhonchi  ABDOMEN: Soft, non-tender, non-distended MUSCULOSKELETAL:  No edema; No deformity  SKIN: Warm and dry NEUROLOGIC:  Alert and oriented x 3 PSYCHIATRIC:  Normal affect   ASSESSMENT:    1. Precordial pain   2. Primary hypertension   3. Pure hypercholesterolemia    PLAN:    In order of problems listed above:  1. Patient with chest pain, risk factors of hypertension, hyperlipidemia, family history of CAD albeit not early.  Will obtain echocardiogram to evaluate systolic and diastolic function, wall motion abnormalities.  Obtain coronary CTA to evaluate presence of CAD. 2. History of hypertension, BP controlled, continue current meds. 3. Hyperlipidemia, 10-year ASCVD risk is 6.4.  Patient not in statin benefit group.  Low-cholesterol diet advised.  Follow-up after echo and coronary CTA.    Shared Decision Making/Informed Consent       Medication Adjustments/Labs and Tests Ordered: Current medicines are reviewed at length with the patient today.  Concerns regarding medicines are outlined above.  Orders Placed This Encounter  Procedures  . CT CORONARY MORPH W/CTA COR W/SCORE W/CA W/CM &/OR WO/CM  . CT CORONARY FRACTIONAL FLOW RESERVE DATA PREP  . CT CORONARY FRACTIONAL FLOW RESERVE FLUID ANALYSIS  . Basic metabolic panel  . EKG 12-Lead  . ECHOCARDIOGRAM COMPLETE   Meds ordered this encounter  Medications  . metoprolol tartrate (LOPRESSOR) 100 MG tablet    Sig: Take 1 tablet (100 mg total) by mouth once for 1 dose. Take 2 hours prior to your CT scan.     Dispense:  1 tablet    Refill:  0    Patient Instructions  Medication Instructions:  Your physician recommends that you continue on your current medications as directed. Please refer to the Current Medication list given to you today.  *If you need a refill on your cardiac medications before your next appointment, please call your pharmacy*   Lab Work: BMP  (To be drawn today)  If you have labs (blood work) drawn today and your tests are completely normal, you will receive your results only by: Marland Kitchen MyChart Message (if you have MyChart) OR . A paper copy in the mail If you have any lab test that is abnormal or we need to change your treatment, we will call you to review the results.   Testing/Procedures:  1.  Your physician has requested that you have an echocardiogram. Echocardiography is a painless test that uses sound waves to create images of your heart. It provides your doctor with information about the size and shape of your heart and how well your heart's chambers and valves are working. This procedure takes approximately one hour. There are no restrictions for this procedure.  2.  Your physician has requested that you have cardiac CT. Cardiac computed tomography (CT) is a painless test that uses an x-ray machine to take clear, detailed pictures of your heart.    Your cardiac CT will be scheduled at :  [facility] [address] [place], [place] [address] [phone]  Please arrive 15 mins early for check-in and test prep.   Please follow these instructions carefully (unless otherwise directed):   On the Night Before the Test: . Be sure to Drink plenty of water. . Do not consume any caffeinated/decaffeinated beverages or chocolate 12 hours prior to your test. . Do not take any antihistamines 12 hours prior to your test.   On the Day of the Test: . Drink plenty of water. Do not drink any water within one hour of the  test. . Do not eat any food 4 hours prior to the test. . You may take  your regular medications prior to the test.  . Take metoprolol (Lopressor) two hours prior to test. . HOLD triamterene-hydrochlorothiazide (MAXZIDE-25)  . FEMALES- please wear underwire-free bra if available    After the Test: . Drink plenty of water. . After receiving IV contrast, you may experience a mild flushed feeling. This is normal. . On occasion, you may experience a mild rash up to 24 hours after the test. This is not dangerous. If this occurs, you can take Benadryl 25 mg and increase your fluid intake. . If you experience trouble breathing, this can be serious. If it is severe call 911 IMMEDIATELY. If it is mild, please call our office. . If you take any of these medications: Glipizide/Metformin, Avandament, Glucavance, please do not take 48 hours after completing test unless otherwise instructed.   Once we have confirmed authorization from your insurance company, we will call you to set up a date and time for your test. Based on how quickly your insurance processes prior authorizations requests, please allow up to 4 weeks to be contacted for scheduling your Cardiac CT appointment. Be advised that routine Cardiac CT appointments could be scheduled as many as 8 weeks after your provider has ordered it.  For non-scheduling related questions, please contact the cardiac imaging nurse navigator should you have any questions/concerns: Marchia Bond, Cardiac Imaging Nurse Navigator Burley Saver, Interim Cardiac Imaging Nurse [place] [facility] Direct Office Dial: [phone]   For scheduling needs, including cancellations and rescheduling, please call [place], [phone].    Follow-Up: At [facility], you and your health needs are our priority.  As part of our continuing mission to provide you with exceptional heart care, we have created designated Provider Care Teams.  These Care  Teams include your primary Cardiologist (physician) and Advanced Practice Providers (APPs -  Physician Assistants and Nurse Practitioners) who all work together to provide you with the care you need, when you need it.  We recommend signing up for the patient portal called "MyChart".  Sign up information is provided on this After Visit Summary.  MyChart is used to connect with patients for Virtual Visits (Telemedicine).  Patients are able to view lab/test results, encounter notes, upcoming appointments, etc.  Non-urgent messages can be sent to your provider as well.   To learn more about what you can do with MyChart, go to [email or web address].    Your next appointment:   5-6 week(s)  The format for your next appointment:   In Person  Provider:   Kate Sable, MD   Other Instructions      Signed, Tracy Sable, MD  10/14/2020 8:58 AM    [place]

## 2020-10-14 NOTE — Patient Instructions (Signed)
Medication Instructions:  Your physician recommends that you continue on your current medications as directed. Please refer to the Current Medication list given to you today.  *If you need a refill on your cardiac medications before your next appointment, please call your pharmacy*   Lab Work: BMP  (To be drawn today)  If you have labs (blood work) drawn today and your tests are completely normal, you will receive your results only by: Marland Kitchen MyChart Message (if you have MyChart) OR . A paper copy in the mail If you have any lab test that is abnormal or we need to change your treatment, we will call you to review the results.   Testing/Procedures:  1.  Your physician has requested that you have an echocardiogram. Echocardiography is a painless test that uses sound waves to create images of your heart. It provides your doctor with information about the size and shape of your heart and how well your heart's chambers and valves are working. This procedure takes approximately one hour. There are no restrictions for this procedure.  2.  Your physician has requested that you have cardiac CT. Cardiac computed tomography (CT) is a painless test that uses an x-ray machine to take clear, detailed pictures of your heart.    Your cardiac CT will be scheduled at :  [facility] [address] [place], [place] [address] [phone]  Please arrive 15 mins early for check-in and test prep.   Please follow these instructions carefully (unless otherwise directed):   On the Night Before the Test: . Be sure to Drink plenty of water. . Do not consume any caffeinated/decaffeinated beverages or chocolate 12 hours prior to your test. . Do not take any antihistamines 12 hours prior to your test.   On the Day of the Test: . Drink plenty of water. Do not drink any water within one hour of the test. . Do not eat any food 4 hours prior to the test. . You may  take your regular medications prior to the test.  . Take metoprolol (Lopressor) two hours prior to test. . HOLD triamterene-hydrochlorothiazide (MAXZIDE-25)  . FEMALES- please wear underwire-free bra if available    After the Test: . Drink plenty of water. . After receiving IV contrast, you may experience a mild flushed feeling. This is normal. . On occasion, you may experience a mild rash up to 24 hours after the test. This is not dangerous. If this occurs, you can take Benadryl 25 mg and increase your fluid intake. . If you experience trouble breathing, this can be serious. If it is severe call 911 IMMEDIATELY. If it is mild, please call our office. . If you take any of these medications: Glipizide/Metformin, Avandament, Glucavance, please do not take 48 hours after completing test unless otherwise instructed.   Once we have confirmed authorization from your insurance company, we will call you to set up a date and time for your test. Based on how quickly your insurance processes prior authorizations requests, please allow up to 4 weeks to be contacted for scheduling your Cardiac CT appointment. Be advised that routine Cardiac CT appointments could be scheduled as many as 8 weeks after your provider has ordered it.  For non-scheduling related questions, please contact the cardiac imaging nurse navigator should you have any questions/concerns: Marchia Bond, Cardiac Imaging Nurse Navigator Burley Saver, Interim Cardiac Imaging Nurse [place] [facility] Direct Office Dial: [phone]   For scheduling  needs, including cancellations and rescheduling, please call [place], [phone].    Follow-Up: At [facility], you and your health needs are our priority.  As part of our continuing mission to provide you with exceptional heart care, we have created designated Provider Care Teams.  These Care Teams include your primary Cardiologist (physician) and Advanced  Practice Providers (APPs -  Physician Assistants and Nurse Practitioners) who all work together to provide you with the care you need, when you need it.  We recommend signing up for the patient portal called "MyChart".  Sign up information is provided on this After Visit Summary.  MyChart is used to connect with patients for Virtual Visits (Telemedicine).  Patients are able to view lab/test results, encounter notes, upcoming appointments, etc.  Non-urgent messages can be sent to your provider as well.   To learn more about what you can do with MyChart, go to [email or web address].    Your next appointment:   5-6 week(s)  The format for your next appointment:   In Person  Provider:   Kate Sable, MD   Other Instructions

## 2020-10-15 LAB — BASIC METABOLIC PANEL
BUN/Creatinine Ratio: 9 — ABNORMAL LOW (ref 12–28)
BUN: 7 mg/dL — ABNORMAL LOW (ref 8–27)
CO2: 24 mmol/L (ref 20–29)
Calcium: 9.3 mg/dL (ref 8.7–10.3)
Chloride: 96 mmol/L (ref 96–106)
Creatinine, Ser: 0.77 mg/dL (ref 0.57–1.00)
GFR calc Af Amer: 93 mL/min/{1.73_m2} (ref >59)
GFR calc non Af Amer: 81 mL/min/{1.73_m2} (ref >59)
Glucose: 96 mg/dL (ref 65–99)
Potassium: 3.7 mmol/L (ref 3.5–5.2)
Sodium: 136 mmol/L (ref 134–144)

## 2020-10-19 ENCOUNTER — Encounter (HOSPITAL_COMMUNITY): Payer: Self-pay

## 2020-10-25 ENCOUNTER — Other Ambulatory Visit: Payer: Self-pay

## 2020-10-25 ENCOUNTER — Ambulatory Visit (INDEPENDENT_AMBULATORY_CARE_PROVIDER_SITE_OTHER): Payer: Medicare Other

## 2020-10-25 ENCOUNTER — Telehealth (HOSPITAL_COMMUNITY): Payer: Self-pay | Admitting: *Deleted

## 2020-10-25 DIAGNOSIS — R072 Precordial pain: Secondary | ICD-10-CM

## 2020-10-25 NOTE — Telephone Encounter (Signed)

## 2020-10-26 LAB — ECHOCARDIOGRAM COMPLETE
Area-P 1/2: 3.53 cm2
S' Lateral: 2.2 cm

## 2020-10-27 ENCOUNTER — Ambulatory Visit: Admission: RE | Admit: 2020-10-27 | Payer: Medicare Other | Source: Ambulatory Visit

## 2020-10-31 ENCOUNTER — Other Ambulatory Visit: Payer: Self-pay

## 2020-10-31 ENCOUNTER — Ambulatory Visit
Admission: RE | Admit: 2020-10-31 | Discharge: 2020-10-31 | Disposition: A | Discharge status: Home / Self Care | Payer: Medicare Other | Source: Ambulatory Visit | Attending: Internal Medicine | Admitting: Internal Medicine

## 2020-10-31 DIAGNOSIS — Z1231 Encounter for screening mammogram for malignant neoplasm of breast: Secondary | ICD-10-CM

## 2020-10-31 DIAGNOSIS — Z96659 Presence of unspecified artificial knee joint: Secondary | ICD-10-CM | POA: Diagnosis not present

## 2020-10-31 DIAGNOSIS — M1712 Unilateral primary osteoarthritis, left knee: Secondary | ICD-10-CM | POA: Diagnosis not present

## 2020-11-01 ENCOUNTER — Telehealth (HOSPITAL_COMMUNITY): Payer: Self-pay | Admitting: *Deleted

## 2020-11-01 NOTE — Telephone Encounter (Signed)

## 2020-11-03 ENCOUNTER — Other Ambulatory Visit: Payer: Self-pay

## 2020-11-03 ENCOUNTER — Ambulatory Visit
Admission: RE | Admit: 2020-11-03 | Discharge: 2020-11-03 | Disposition: A | Discharge status: Home / Self Care | Payer: Medicare Other | Source: Ambulatory Visit | Attending: Cardiology | Admitting: Cardiology

## 2020-11-03 DIAGNOSIS — R072 Precordial pain: Secondary | ICD-10-CM | POA: Insufficient documentation

## 2020-11-03 MED ORDER — NITROGLYCERIN 0.4 MG SL SUBL
0.8000 mg | SUBLINGUAL_TABLET | Freq: Once | SUBLINGUAL | Status: AC
  Administered 2020-11-03: 0.8 mg via SUBLINGUAL

## 2020-11-03 MED ORDER — IOHEXOL 350 MG/ML SOLN
85.0000 mL | Freq: Once | INTRAVENOUS | Status: AC | PRN
  Administered 2020-11-03: 85 mL via INTRAVENOUS

## 2020-11-03 NOTE — Progress Notes (Signed)
Patient tolerated CT well. Drinking water sitting in chair. Vital signs stable encourage to drink water throughout day.Reasons explained and verbalized understanding. Ambulated steady gait.   

## 2020-11-08 ENCOUNTER — Other Ambulatory Visit: Payer: Medicare Other

## 2020-11-24 ENCOUNTER — Ambulatory Visit: Payer: Medicare Other | Admitting: Cardiology

## 2020-12-02 ENCOUNTER — Ambulatory Visit: Payer: Medicare Other | Admitting: Cardiology

## 2020-12-06 ENCOUNTER — Other Ambulatory Visit: Payer: Self-pay | Admitting: Internal Medicine

## 2020-12-06 DIAGNOSIS — F419 Anxiety disorder, unspecified: Secondary | ICD-10-CM

## 2020-12-09 ENCOUNTER — Encounter: Payer: Self-pay | Admitting: Cardiology

## 2020-12-09 ENCOUNTER — Other Ambulatory Visit: Payer: Self-pay

## 2020-12-09 ENCOUNTER — Ambulatory Visit: Payer: Medicare Other | Admitting: Cardiology

## 2020-12-09 VITALS — BP 128/84 | HR 74 | Ht 67.0 in | Wt 198.0 lb

## 2020-12-09 DIAGNOSIS — E7800 Pure hypercholesterolemia, unspecified: Secondary | ICD-10-CM

## 2020-12-09 DIAGNOSIS — I10 Essential (primary) hypertension: Secondary | ICD-10-CM

## 2020-12-09 DIAGNOSIS — R072 Precordial pain: Secondary | ICD-10-CM

## 2020-12-09 NOTE — Patient Instructions (Signed)

## 2020-12-09 NOTE — Progress Notes (Signed)
Cardiology Office Note:    Date:  12/09/2020   ID:  Janna, Oak 24-Jun-1956, MRN [id number]  PCP:  Glean Hess, MD  [facility] Cardiologist:  Kate Sable, MD  [place] Electrophysiologist:  None   Referring MD: Glean Hess, MD   Chief Complaint  Patient presents with  . Follow-up    Testing--echo and CTA    History of Present Illness:    Tracy Chase is a 65 y.o. female with a hx of hypertension, hyperlipidemia who presents for follow-up.  Patient last seen due to chest pain over 3 months.  Due to risk factors, echo and coronary CTA was ordered to evaluate cardiac function and presence of CAD.  She now presents for test results.  States having stresses at home with her husband recently having a heart attack.   Past Medical History:  Diagnosis Date  . Cancer (HCC)    RENAL CELL  . Depression   . Insomnia     Past Surgical History:  Procedure Laterality Date  . ABDOMINAL HYSTERECTOMY    . APPENDECTOMY    . BACK SURGERY     "rods in back"  . COLONOSCOPY WITH PROPOFOL N/A 08/28/2019   Procedure: COLONOSCOPY WITH PROPOFOL;  Surgeon: Virgel Manifold, MD;  Location: [facility] ENDOSCOPY;  Service: Endoscopy;  Laterality: N/A;  . cryoablation for renal cell carcinoma  2012  . ETHMOIDECTOMY Left 09/06/2020   Procedure: TOTAL ETHMOIDECTOMY;  Surgeon: Clyde Canterbury, MD;  Location: [place];  Service: ENT;  Laterality: Left;  . FRONTAL SINUS EXPLORATION Left 09/06/2020   Procedure: FRONTAL SINUS EXPLORATION;  Surgeon: Clyde Canterbury, MD;  Location: [place];  Service: ENT;  Laterality: Left;  . IMAGE GUIDED SINUS SURGERY N/A 09/06/2020   Procedure: IMAGE GUIDED SINUS SURGERY;  Surgeon: Clyde Canterbury, MD;  Location: [place];  Service: ENT;  Laterality: N/A;  need stryker disk PLACE DISK ON OR CHARGE NURSE DESK 10-01 KP  . JOINT REPLACEMENT    . LUMBAR FUSION    . MAXILLARY ANTROSTOMY Left 09/06/2020   Procedure:  MAXILLARY ANTROSTOMY WITH TISSUE;  Surgeon: Clyde Canterbury, MD;  Location: [place];  Service: ENT;  Laterality: Left;  Marland Kitchen MEDIAL PARTIAL KNEE REPLACEMENT Left   . PARTIAL HYSTERECTOMY    . SPHENOIDECTOMY Left 09/06/2020   Procedure: SPHENOIDECTOMY;  Surgeon: Clyde Canterbury, MD;  Location: [place];  Service: ENT;  Laterality: Left;  . TONSILLECTOMY    . VENTRAL HERNIA REPAIR  2012   Dr. Genevive Bi    Current Medications: Current Meds  Medication Sig  . FLUoxetine (PROZAC) 20 MG capsule TAKE 1 CAPSULE BY MOUTH  DAILY  . triamterene-hydrochlorothiazide (MAXZIDE-25) 37.5-25 MG tablet Take 1 tablet by mouth daily.  Marland Kitchen UNABLE TO FIND Med Name: Serovital  . zolpidem (AMBIEN) 10 MG tablet Take 1 tablet (10 mg total) by mouth at bedtime.     Allergies:   Hydrocodone   Social History   Socioeconomic History  . Marital status: Significant Other    Spouse name: Not on file  . Number of children: 1  . Years of education: Not on file  . Highest education level: Not on file  Occupational History  . Not on file  Tobacco Use  . Smoking status: Never Smoker  . Smokeless tobacco: Never Used  . Tobacco comment: was "social Smoker" over 15 yrs ago  Vaping Use  . Vaping Use: Never used  Substance and Sexual Activity  .  Alcohol use: Yes    Alcohol/week: 0.0 standard drinks    Comment: socially 1-2X/mo  . Drug use: No  . Sexual activity: Not on file  Other Topics Concern  . Not on file  Social History Narrative   Pt has 1 son and 2 step daughters. Lives with significant other   Social Determinants of Health   Financial Resource Strain: Low Risk   . Difficulty of Paying Living Expenses: Not hard at all  Food Insecurity: No Food Insecurity  . Worried About Charity fundraiser in the Last Year: Never true  . Ran Out of Food in the Last Year: Never true  Transportation Needs: No Transportation Needs  . Lack of Transportation (Medical): No  . Lack of Transportation  (Non-Medical): No  Physical Activity: Insufficiently Active  . Days of Exercise per Week: 4 days  . Minutes of Exercise per Session: 30 min  Stress: Stress Concern Present  . Feeling of Stress : Rather much  Social Connections: Moderately Isolated  . Frequency of Communication with Friends and Family: More than three times a week  . Frequency of Social Gatherings with Friends and Family: Once a week  . Attends Religious Services: Never  . Active Member of Clubs or Organizations: No  . Attends Archivist Meetings: Never  . Marital Status: Living with partner     Family History: The patient's family history includes Congestive Heart Failure in her father; Kidney failure in her mother. There is no history of Breast cancer.  ROS:   Please see the history of present illness.     All other systems reviewed and are negative.  EKGs/Labs/Other Studies Reviewed:    The following studies were reviewed today:   EKG:  EKG not ordered today.    Recent Labs: 10/12/2020: ALT 14; Hemoglobin 13.3; Platelets 223; TSH 1.650 10/14/2020: BUN 7; Creatinine, Ser 0.77; Potassium 3.7; Sodium 136  Recent Lipid Panel    Component Value Date/Time   CHOL 232 (H) 10/12/2020 0854   TRIG 67 10/12/2020 0854   HDL 96 10/12/2020 0854   CHOLHDL 2.4 10/12/2020 0854   LDLCALC 125 (H) 10/12/2020 0854     Risk Assessment/Calculations:      Physical Exam:    VS:  BP 128/84   Pulse 74   Ht 5\' 7"  (1.702 m)   Wt 198 lb (89.8 kg)   BMI 31.01 kg/m     Wt Readings from Last 3 Encounters:  12/09/20 198 lb (89.8 kg)  10/14/20 201 lb (91.2 kg)  10/12/20 200 lb (90.7 kg)     GEN:  Well nourished, well developed in no acute distress HEENT: Normal NECK: No JVD; No carotid bruits LYMPHATICS: No lymphadenopathy CARDIAC: RRR, no murmurs, rubs, gallops RESPIRATORY:  Clear to auscultation without rales, wheezing or rhonchi  ABDOMEN: Soft, non-tender, non-distended MUSCULOSKELETAL:  No edema; No  deformity  SKIN: Warm and dry NEUROLOGIC:  Alert and oriented x 3 PSYCHIATRIC:  Normal affect   ASSESSMENT:    1. Precordial pain   2. Primary hypertension   3. Pure hypercholesterolemia    PLAN:    In order of problems listed above:  1. Patient with chest pain, risk factors of hypertension, hyperlipidemia.  Echo showed normal systolic function, regurgitation, 60 to 65%, no wall motion abnormalities.  Coronary CTA cardiac calcium score of 0, no evidence of CAD.  Patient made aware of results and reassured. 2. History of hypertension, BP controlled, continue Maxzide. 3. Hyperlipidemia, 10-year ASCVD risk  is 6.4.  Low-cholesterol diet advised.  She is not in statin benefit group.  Follow-up as needed    Shared Decision Making/Informed Consent       Medication Adjustments/Labs and Tests Ordered: Current medicines are reviewed at length with the patient today.  Concerns regarding medicines are outlined above.  No orders of the defined types were placed in this encounter.  No orders of the defined types were placed in this encounter.   Patient Instructions  Medication Instructions:  Your physician recommends that you continue on your current medications as directed. Please refer to the Current Medication list given to you today.  *If you need a refill on your cardiac medications before your next appointment, please call your pharmacy*   Lab Work: None Ordered If you have labs (blood work) drawn today and your tests are completely normal, you will receive your results only by: Marland Kitchen MyChart Message (if you have MyChart) OR . A paper copy in the mail If you have any lab test that is abnormal or we need to change your treatment, we will call you to review the results.   Testing/Procedures: None Ordered   Follow-Up: At [facility], you and your health needs are our priority.  As part of our continuing mission to provide you with exceptional heart care, we have created  designated Provider Care Teams.  These Care Teams include your primary Cardiologist (physician) and Advanced Practice Providers (APPs -  Physician Assistants and Nurse Practitioners) who all work together to provide you with the care you need, when you need it.  We recommend signing up for the patient portal called "MyChart".  Sign up information is provided on this After Visit Summary.  MyChart is used to connect with patients for Virtual Visits (Telemedicine).  Patients are able to view lab/test results, encounter notes, upcoming appointments, etc.  Non-urgent messages can be sent to your provider as well.   To learn more about what you can do with MyChart, go to [email or web address].    Your next appointment:   Follow up as needed   The format for your next appointment:   In Person  Provider:   Kate Sable, MD   Other Instructions      Signed, Kate Sable, MD  12/09/2020 12:42 PM    [place]

## 2020-12-20 ENCOUNTER — Other Ambulatory Visit: Payer: Self-pay | Admitting: Internal Medicine

## 2020-12-20 DIAGNOSIS — I10 Essential (primary) hypertension: Secondary | ICD-10-CM

## 2020-12-21 ENCOUNTER — Ambulatory Visit (INDEPENDENT_AMBULATORY_CARE_PROVIDER_SITE_OTHER): Payer: Medicare Other | Admitting: Internal Medicine

## 2020-12-21 ENCOUNTER — Other Ambulatory Visit: Payer: Self-pay

## 2020-12-21 ENCOUNTER — Encounter: Payer: Self-pay | Admitting: Internal Medicine

## 2020-12-21 VITALS — BP 136/78 | HR 77 | Ht 67.0 in | Wt 200.0 lb

## 2020-12-21 DIAGNOSIS — N3000 Acute cystitis without hematuria: Secondary | ICD-10-CM | POA: Diagnosis not present

## 2020-12-21 LAB — POC URINALYSIS WITH MICROSCOPIC (NON AUTO)MANUAL RESULT
Bilirubin, UA: NEGATIVE
Blood, UA: NEGATIVE
Crystals: 0
Epithelial cells, urine per micros: 0
Glucose, UA: NEGATIVE
Ketones, UA: NEGATIVE
Leukocytes, UA: NEGATIVE
Mucus, UA: 0
Nitrite, UA: NEGATIVE
Protein, UA: NEGATIVE
RBC: 0 M/uL — AB (ref 4.04–5.48)
Spec Grav, UA: <=1.005 — AB (ref 1.010–1.025)
Urobilinogen, UA: 0.2 E.U./dL
WBC Casts, UA: 0
pH, UA: 6 (ref 5.0–8.0)

## 2020-12-21 MED ORDER — SULFAMETHOXAZOLE-TRIMETHOPRIM 800-160 MG PO TABS: 1.0000 | ORAL_TABLET | Freq: Two times a day (BID) | ORAL | 0 refills | Status: AC

## 2020-12-21 NOTE — Progress Notes (Signed)
Date:  12/21/2020   Name:  Tracy Chase   DOB:  Nov 20, 1955   MRN:  [id number]   Chief Complaint: Urinary Tract Infection (Patient trying to treat sxs herself for 1 week and not getting any better. Pain and pressure. )  Urinary Tract Infection  This is a new problem. The current episode started in the past 7 days. The problem has been unchanged. The quality of the pain is described as burning. The pain is mild. There has been no fever. Associated symptoms include frequency and urgency. Pertinent negatives include no chills, discharge, flank pain, hematuria, nausea or vomiting. She has tried increased fluids for the symptoms.    Lab Results  Component Value Date   CREATININE 0.77 10/14/2020   BUN 7 (L) 10/14/2020   NA 136 10/14/2020   K 3.7 10/14/2020   CL 96 10/14/2020   CO2 24 10/14/2020   Lab Results  Component Value Date   CHOL 232 (H) 10/12/2020   HDL 96 10/12/2020   LDLCALC 125 (H) 10/12/2020   TRIG 67 10/12/2020   CHOLHDL 2.4 10/12/2020   Lab Results  Component Value Date   TSH 1.650 10/12/2020   No results found for: HGBA1C Lab Results  Component Value Date   WBC 5.2 10/12/2020   HGB 13.3 10/12/2020   HCT 38.9 10/12/2020   MCV 89 10/12/2020   PLT 223 10/12/2020   Lab Results  Component Value Date   ALT 14 10/12/2020   AST 19 10/12/2020   ALKPHOS 69 10/12/2020   BILITOT 0.4 10/12/2020     Review of Systems  Constitutional: Negative for chills, fatigue and fever.  Respiratory: Negative for chest tightness and shortness of breath.   Cardiovascular: Negative for chest pain, palpitations and leg swelling.  Gastrointestinal: Negative for nausea and vomiting.  Genitourinary: Positive for frequency and urgency. Negative for flank pain and hematuria.    Patient Active Problem List   Diagnosis Date Noted  . Mixed hyperlipidemia 09/25/2019  . Gastroesophageal reflux disease 09/23/2019  . Encounter for screening colonoscopy   . Polyp of colon   . Bone spur  of foot 03/19/2018  . Degenerative disc disease, cervical 11/18/2017  . Generalized anxiety disorder 06/21/2015  . History of renal cell cancer 06/21/2015  . Carpal tunnel syndrome 06/21/2015  . Essential (primary) hypertension 06/21/2015  . Idiopathic insomnia 06/21/2015    Allergies  Allergen Reactions  . Hydrocodone Itching    Past Surgical History:  Procedure Laterality Date  . ABDOMINAL HYSTERECTOMY    . APPENDECTOMY    . BACK SURGERY     "rods in back"  . COLONOSCOPY WITH PROPOFOL N/A 08/28/2019   Procedure: COLONOSCOPY WITH PROPOFOL;  Surgeon: Virgel Manifold, MD;  Location: [facility] ENDOSCOPY;  Service: Endoscopy;  Laterality: N/A;  . cryoablation for renal cell carcinoma  2012  . ETHMOIDECTOMY Left 09/06/2020   Procedure: TOTAL ETHMOIDECTOMY;  Surgeon: Clyde Canterbury, MD;  Location: [place];  Service: ENT;  Laterality: Left;  . FRONTAL SINUS EXPLORATION Left 09/06/2020   Procedure: FRONTAL SINUS EXPLORATION;  Surgeon: Clyde Canterbury, MD;  Location: [place];  Service: ENT;  Laterality: Left;  . IMAGE GUIDED SINUS SURGERY N/A 09/06/2020   Procedure: IMAGE GUIDED SINUS SURGERY;  Surgeon: Clyde Canterbury, MD;  Location: [place];  Service: ENT;  Laterality: N/A;  need stryker disk PLACE DISK ON OR CHARGE NURSE DESK 10-01 KP  . JOINT REPLACEMENT    . LUMBAR FUSION    .  MAXILLARY ANTROSTOMY Left 09/06/2020   Procedure: MAXILLARY ANTROSTOMY WITH TISSUE;  Surgeon: Clyde Canterbury, MD;  Location: [place];  Service: ENT;  Laterality: Left;  Marland Kitchen MEDIAL PARTIAL KNEE REPLACEMENT Left   . PARTIAL HYSTERECTOMY    . SPHENOIDECTOMY Left 09/06/2020   Procedure: SPHENOIDECTOMY;  Surgeon: Clyde Canterbury, MD;  Location: [place];  Service: ENT;  Laterality: Left;  . TONSILLECTOMY    . VENTRAL HERNIA REPAIR  2012   Dr. Genevive Bi    Social History   Tobacco Use  . Smoking status: Never Smoker  . Smokeless tobacco: Never Used  . Tobacco  comment: was "social Smoker" over 15 yrs ago  Vaping Use  . Vaping Use: Never used  Substance Use Topics  . Alcohol use: Yes    Alcohol/week: 0.0 standard drinks    Comment: socially 1-2X/mo  . Drug use: No     Medication list has been reviewed and updated.  Current Meds  Medication Sig  . FLUoxetine (PROZAC) 20 MG capsule TAKE 1 CAPSULE BY MOUTH  DAILY  . triamterene-hydrochlorothiazide (MAXZIDE-25) 37.5-25 MG tablet TAKE 1 TABLET BY MOUTH  DAILY  . UNABLE TO FIND Med Name: Serovital  . zolpidem (AMBIEN) 10 MG tablet Take 1 tablet (10 mg total) by mouth at bedtime.    PHQ 2/9 Scores 12/21/2020 10/12/2020 06/15/2020 05/24/2020  PHQ - 2 Score 2 0 0 0  PHQ- 9 Score 6 0 0 0    GAD 7 : Generalized Anxiety Score 12/21/2020 10/12/2020 06/15/2020 05/24/2020  Nervous, Anxious, on Edge 2 0 0 3  Control/stop worrying 2 2 0 2  Worry too much - different things 2 2 0 2  Trouble relaxing 0 0 0 0  Restless 0 0 0 0  Easily annoyed or irritable 0 1 0 0  Afraid - awful might happen 0 3 0 1  Total GAD 7 Score 6 8 0 8  Anxiety Difficulty Not difficult at all - Not difficult at all Not difficult at all    BP Readings from Last 3 Encounters:  12/21/20 136/78  12/09/20 128/84  11/03/20 (!) 109/54    Physical Exam Vitals and nursing note reviewed.  Constitutional:      Appearance: Normal appearance. She is well-developed and well-nourished.  Cardiovascular:     Rate and Rhythm: Normal rate and regular rhythm.     Heart sounds: Normal heart sounds.  Pulmonary:     Effort: Pulmonary effort is normal. No respiratory distress.     Breath sounds: Normal breath sounds.  Abdominal:     General: Bowel sounds are normal.     Palpations: Abdomen is soft.     Tenderness: There is abdominal tenderness in the suprapubic area. There is no CVA tenderness, guarding or rebound.  Neurological:     Mental Status: She is alert.  Psychiatric:        Mood and Affect: Mood and affect normal.     Wt Readings  from Last 3 Encounters:  12/21/20 200 lb (90.7 kg)  12/09/20 198 lb (89.8 kg)  10/14/20 201 lb (91.2 kg)    BP 136/78   Pulse 77   Ht 5\' 7"  (1.702 m)   Wt 200 lb (90.7 kg)   SpO2 96%   BMI 31.32 kg/m   Assessment and Plan: 1. Acute cystitis without hematuria Continue fluids, AZO if needed - POC urinalysis w microscopic (non auto) - sulfamethoxazole-trimethoprim (BACTRIM DS) 800-160 MG tablet; Take 1 tablet by mouth 2 (two) times  daily for 3 days.  Dispense: 6 tablet; Refill: 0   Partially dictated using Editor, commissioning. Any errors are unintentional.  Halina Maidens, MD [facility]  12/21/2020

## 2021-01-30 ENCOUNTER — Other Ambulatory Visit: Payer: Self-pay | Admitting: Orthopedic Surgery

## 2021-02-13 ENCOUNTER — Other Ambulatory Visit: Payer: Self-pay

## 2021-02-13 ENCOUNTER — Other Ambulatory Visit
Admission: RE | Admit: 2021-02-13 | Discharge: 2021-02-13 | Disposition: A | Discharge status: Home / Self Care | Payer: Medicare Other | Source: Ambulatory Visit | Attending: Orthopedic Surgery | Admitting: Orthopedic Surgery

## 2021-02-13 DIAGNOSIS — Z01812 Encounter for preprocedural laboratory examination: Secondary | ICD-10-CM | POA: Diagnosis not present

## 2021-02-13 HISTORY — DX: Unspecified osteoarthritis, unspecified site: M19.90

## 2021-02-13 HISTORY — DX: Gastro-esophageal reflux disease without esophagitis: K21.9

## 2021-02-13 LAB — CBC WITH DIFFERENTIAL/PLATELET
Abs Immature Granulocytes: 0.02 10*3/uL (ref 0.00–0.07)
Basophils Absolute: 0 10*3/uL (ref 0.0–0.1)
Basophils Relative: 1 %
Eosinophils Absolute: 0.1 10*3/uL (ref 0.0–0.5)
Eosinophils Relative: 1 %
HCT: 38.4 % (ref 36.0–46.0)
Hemoglobin: 13.1 g/dL (ref 12.0–15.0)
Immature Granulocytes: 0 %
Lymphocytes Relative: 43 %
Lymphs Abs: 2.4 10*3/uL (ref 0.7–4.0)
MCH: 30.4 pg (ref 26.0–34.0)
MCHC: 34.1 g/dL (ref 30.0–36.0)
MCV: 89.1 fL (ref 80.0–100.0)
Monocytes Absolute: 0.4 10*3/uL (ref 0.1–1.0)
Monocytes Relative: 7 %
Neutro Abs: 2.7 10*3/uL (ref 1.7–7.7)
Neutrophils Relative %: 48 %
Platelets: 214 10*3/uL (ref 150–400)
RBC: 4.31 MIL/uL (ref 3.87–5.11)
RDW: 12.9 % (ref 11.5–15.5)
WBC: 5.6 10*3/uL (ref 4.0–10.5)
nRBC: 0 % (ref 0.0–0.2)

## 2021-02-13 LAB — COMPREHENSIVE METABOLIC PANEL
ALT: 15 U/L (ref 0–44)
AST: 18 U/L (ref 15–41)
Albumin: 3.9 g/dL (ref 3.5–5.0)
Alkaline Phosphatase: 55 U/L (ref 38–126)
Anion gap: 6 (ref 5–15)
BUN: 8 mg/dL (ref 8–23)
CO2: 29 mmol/L (ref 22–32)
Calcium: 9.1 mg/dL (ref 8.9–10.3)
Chloride: 103 mmol/L (ref 98–111)
Creatinine, Ser: 0.76 mg/dL (ref 0.44–1.00)
GFR, Estimated: >60 mL/min (ref >60)
Glucose, Bld: 94 mg/dL (ref 70–99)
Potassium: 3.5 mmol/L (ref 3.5–5.1)
Sodium: 138 mmol/L (ref 135–145)
Total Bilirubin: 0.8 mg/dL (ref 0.3–1.2)
Total Protein: 6.6 g/dL (ref 6.5–8.1)

## 2021-02-13 LAB — SURGICAL PCR SCREEN
MRSA, PCR: NEGATIVE
Staphylococcus aureus: NEGATIVE

## 2021-02-13 LAB — TYPE AND SCREEN
ABO/RH(D): A POS
Antibody Screen: NEGATIVE

## 2021-02-13 LAB — URINALYSIS, ROUTINE W REFLEX MICROSCOPIC
Bilirubin Urine: NEGATIVE
Glucose, UA: NEGATIVE mg/dL
Hgb urine dipstick: NEGATIVE
Ketones, ur: NEGATIVE mg/dL
Leukocytes,Ua: NEGATIVE
Nitrite: NEGATIVE
Protein, ur: NEGATIVE mg/dL
Specific Gravity, Urine: 1.002 — ABNORMAL LOW (ref 1.005–1.030)
pH: 8 (ref 5.0–8.0)

## 2021-02-13 NOTE — Patient Instructions (Signed)
Your procedure is scheduled on:02-21-21 TUESDAY Report to the Registration Desk on the 1st floor of the [facility]-Then proceed to the 2nd floor Surgery Desk in the [place] To find out your arrival time, please call [phone] between 1PM - 3PM on:02-20-21 MONDAY  REMEMBER: Instructions that are not followed completely may result in serious medical risk, up to and including death; or upon the discretion of your surgeon and anesthesiologist your surgery may need to be rescheduled.  Do not eat food after midnight the night before surgery.  No gum chewing, lozengers or hard candies.  You may however, drink CLEAR liquids up to 2 hours before you are scheduled to arrive for your surgery. Do not drink anything within 2 hours of your scheduled arrival time.  Clear liquids include: - water  - apple juice without pulp - gatorade - black coffee or tea (Do NOT add milk or creamers to the coffee or tea) Do NOT drink anything that is not on this list.  In addition, your doctor has ordered for you to drink the provided  Ensure Pre-Surgery Clear Carbohydrate Drink  Drinking this carbohydrate drink up to two hours before surgery helps to reduce insulin resistance and improve patient outcomes. Please complete drinking 2 hours prior to scheduled arrival time.  TAKE THESE MEDICATIONS THE MORNING OF SURGERY WITH A SIP OF WATER: -PROZAC (FLUOXETINE)  One week prior to surgery: Stop Anti-inflammatories (NSAIDS) such as Advil, Aleve, Ibuprofen, Motrin, Naproxen, Naprosyn and Aspirin based products such as Excedrin, Goodys Powder, BC Powder-OK TO TAKE TYLENOL IF NEEDED  Stop ANY OVER THE COUNTER supplements until after surgery-STOP YOUR SEROVITAL ANTI-AGING RENEWAL COMPLEX NOW-YOU MAY RESUME AFTER SURGERY  No Alcohol for 24 hours before or after surgery.  No Smoking including e-cigarettes for 24 hours prior to surgery.  No chewable tobacco products for at least 6 hours prior to surgery.  No  nicotine patches on the day of surgery.  Do not use any "recreational" drugs for at least a week prior to your surgery.  Please be advised that the combination of cocaine and anesthesia may have negative outcomes, up to and including death. If you test positive for cocaine, your surgery will be cancelled.  On the morning of surgery brush your teeth with toothpaste and water, you may rinse your mouth with mouthwash if you wish. Do not swallow any toothpaste or mouthwash.  Do not wear jewelry, make-up, hairpins, clips or nail polish.  Do not wear lotions, powders, or perfumes.   Do not shave body from the neck down 48 hours prior to surgery just in case you cut yourself which could leave a site for infection.  Also, freshly shaved skin may become irritated if using the CHG soap.  Contact lenses, hearing aids and dentures may not be worn into surgery.  Do not bring valuables to the hospital. [facility] is not responsible for any missing/lost belongings or valuables.   Use CHG Soap as directed on instruction sheet.  Notify your doctor if there is any change in your medical condition (cold, fever, infection).  Wear comfortable clothing (specific to your surgery type) to the hospital.  Plan for stool softeners for home use; pain medications have a tendency to cause constipation. You can also help prevent constipation by eating foods high in fiber such as fruits and vegetables and drinking plenty of fluids as your diet allows.  After surgery, you can help prevent lung complications by doing breathing exercises.  Take deep breaths and  cough every 1-2 hours. Your doctor may order a device called an Incentive Spirometer to help you take deep breaths. When coughing or sneezing, hold a pillow firmly against your incision with both hands. This is called "splinting." Doing this helps protect your incision. It also decreases belly discomfort.  If you are being admitted to the hospital overnight,  leave your suitcase in the car. After surgery it may be brought to your room.  If you are being discharged the day of surgery, you will not be allowed to drive home. You will need a responsible adult (18 years or older) to drive you home and stay with you that night.   If you are taking public transportation, you will need to have a responsible adult (18 years or older) with you. Please confirm with your physician that it is acceptable to use public transportation.   Please call the [facility] [facility] at [phone] if you have any questions about these instructions.  Surgery Visitation Policy:  Patients undergoing a surgery or procedure may have one family member or support person with them as long as that person is not COVID-19 positive or experiencing its symptoms.  That person may remain in the waiting area during the procedure.  Inpatient Visitation:    Visiting hours are 7 a.m. to 8 p.m. Inpatients will be allowed two visitors daily. The visitors may change each day during the patient's stay. No visitors under the age of 46. Any visitor under the age of 55 must be accompanied by an adult. The visitor must pass COVID-19 screenings, use hand sanitizer when entering and exiting the patient's room and wear a mask at all times, including in the patient's room. Patients must also wear a mask when staff or their visitor are in the room. Masking is required regardless of vaccination status.

## 2021-02-15 DIAGNOSIS — M778 Other enthesopathies, not elsewhere classified: Secondary | ICD-10-CM | POA: Diagnosis not present

## 2021-02-15 DIAGNOSIS — Q6689 Other  specified congenital deformities of feet: Secondary | ICD-10-CM | POA: Diagnosis not present

## 2021-02-17 ENCOUNTER — Other Ambulatory Visit
Admission: RE | Admit: 2021-02-17 | Discharge: 2021-02-17 | Disposition: A | Discharge status: Home / Self Care | Payer: Medicare Other | Source: Ambulatory Visit | Attending: Orthopedic Surgery | Admitting: Orthopedic Surgery

## 2021-02-17 ENCOUNTER — Other Ambulatory Visit: Payer: Self-pay

## 2021-02-17 DIAGNOSIS — Z01812 Encounter for preprocedural laboratory examination: Secondary | ICD-10-CM | POA: Diagnosis not present

## 2021-02-17 DIAGNOSIS — Z20822 Contact with and (suspected) exposure to covid-19: Secondary | ICD-10-CM | POA: Insufficient documentation

## 2021-02-17 LAB — SARS CORONAVIRUS 2 (TAT 6-24 HRS): SARS Coronavirus 2: NEGATIVE

## 2021-02-21 ENCOUNTER — Inpatient Hospital Stay: Payer: Medicare Other | Admitting: Urgent Care

## 2021-02-21 ENCOUNTER — Other Ambulatory Visit: Payer: Self-pay

## 2021-02-21 ENCOUNTER — Encounter
Admission: RE | Disposition: A | Discharge status: Home Health | Payer: Self-pay | Source: Home / Self Care | Attending: Orthopedic Surgery

## 2021-02-21 ENCOUNTER — Inpatient Hospital Stay: Payer: Medicare Other

## 2021-02-21 ENCOUNTER — Inpatient Hospital Stay
Admission: RE | Admit: 2021-02-21 | Discharge: 2021-02-23 | DRG: 468 | Disposition: A | Discharge status: Home Health | Payer: Medicare Other | Attending: Orthopedic Surgery | Admitting: Orthopedic Surgery

## 2021-02-21 ENCOUNTER — Encounter: Payer: Self-pay | Admitting: Orthopedic Surgery

## 2021-02-21 DIAGNOSIS — Z90710 Acquired absence of both cervix and uterus: Secondary | ICD-10-CM | POA: Diagnosis not present

## 2021-02-21 DIAGNOSIS — Z85528 Personal history of other malignant neoplasm of kidney: Secondary | ICD-10-CM | POA: Diagnosis not present

## 2021-02-21 DIAGNOSIS — Z96659 Presence of unspecified artificial knee joint: Secondary | ICD-10-CM | POA: Diagnosis not present

## 2021-02-21 DIAGNOSIS — Z96652 Presence of left artificial knee joint: Secondary | ICD-10-CM | POA: Diagnosis present

## 2021-02-21 DIAGNOSIS — G4700 Insomnia, unspecified: Secondary | ICD-10-CM | POA: Diagnosis not present

## 2021-02-21 DIAGNOSIS — F419 Anxiety disorder, unspecified: Secondary | ICD-10-CM | POA: Diagnosis present

## 2021-02-21 DIAGNOSIS — M172 Bilateral post-traumatic osteoarthritis of knee: Secondary | ICD-10-CM | POA: Diagnosis not present

## 2021-02-21 DIAGNOSIS — F32A Depression, unspecified: Secondary | ICD-10-CM | POA: Diagnosis present

## 2021-02-21 DIAGNOSIS — E669 Obesity, unspecified: Secondary | ICD-10-CM | POA: Diagnosis present

## 2021-02-21 DIAGNOSIS — M1712 Unilateral primary osteoarthritis, left knee: Principal | ICD-10-CM | POA: Diagnosis present

## 2021-02-21 DIAGNOSIS — M7750 Other enthesopathy of unspecified foot: Secondary | ICD-10-CM | POA: Diagnosis not present

## 2021-02-21 DIAGNOSIS — Z8249 Family history of ischemic heart disease and other diseases of the circulatory system: Secondary | ICD-10-CM | POA: Diagnosis not present

## 2021-02-21 DIAGNOSIS — E782 Mixed hyperlipidemia: Secondary | ICD-10-CM | POA: Diagnosis not present

## 2021-02-21 DIAGNOSIS — Z87891 Personal history of nicotine dependence: Secondary | ICD-10-CM | POA: Diagnosis not present

## 2021-02-21 DIAGNOSIS — Z833 Family history of diabetes mellitus: Secondary | ICD-10-CM

## 2021-02-21 DIAGNOSIS — Z471 Aftercare following joint replacement surgery: Secondary | ICD-10-CM | POA: Diagnosis not present

## 2021-02-21 DIAGNOSIS — R531 Weakness: Secondary | ICD-10-CM | POA: Diagnosis not present

## 2021-02-21 DIAGNOSIS — I10 Essential (primary) hypertension: Secondary | ICD-10-CM | POA: Diagnosis present

## 2021-02-21 DIAGNOSIS — K219 Gastro-esophageal reflux disease without esophagitis: Secondary | ICD-10-CM | POA: Diagnosis present

## 2021-02-21 DIAGNOSIS — Z6831 Body mass index (BMI) 31.0-31.9, adult: Secondary | ICD-10-CM | POA: Diagnosis not present

## 2021-02-21 DIAGNOSIS — Z79899 Other long term (current) drug therapy: Secondary | ICD-10-CM | POA: Diagnosis not present

## 2021-02-21 DIAGNOSIS — G8918 Other acute postprocedural pain: Secondary | ICD-10-CM

## 2021-02-21 HISTORY — PX: TOTAL KNEE REVISION: SHX996

## 2021-02-21 LAB — CBC
HCT: 34.1 % — ABNORMAL LOW (ref 36.0–46.0)
Hemoglobin: 11.8 g/dL — ABNORMAL LOW (ref 12.0–15.0)
MCH: 30.6 pg (ref 26.0–34.0)
MCHC: 34.6 g/dL (ref 30.0–36.0)
MCV: 88.3 fL (ref 80.0–100.0)
Platelets: 161 10*3/uL (ref 150–400)
RBC: 3.86 MIL/uL — ABNORMAL LOW (ref 3.87–5.11)
RDW: 12.7 % (ref 11.5–15.5)
WBC: 7.5 10*3/uL (ref 4.0–10.5)
nRBC: 0 % (ref 0.0–0.2)

## 2021-02-21 LAB — CREATININE, SERUM
Creatinine, Ser: 0.81 mg/dL (ref 0.44–1.00)
GFR, Estimated: >60 mL/min (ref >60)

## 2021-02-21 LAB — ABO/RH: ABO/RH(D): A POS

## 2021-02-21 SURGERY — TOTAL KNEE REVISION
Anesthesia: General | Site: Knee | Laterality: Left

## 2021-02-21 MED ORDER — TRIAMTERENE-HCTZ 37.5-25 MG PO TABS
1.0000 | ORAL_TABLET | Freq: Every morning | ORAL | Status: DC
  Administered 2021-02-22 – 2021-02-23 (×2): 1 via ORAL
  Filled 2021-02-21 (×2): qty 1

## 2021-02-21 MED ORDER — CHLORHEXIDINE GLUCONATE 0.12 % MT SOLN
15.0000 mL | Freq: Once | OROMUCOSAL | Status: AC
  Administered 2021-02-21: 15 mL via OROMUCOSAL

## 2021-02-21 MED ORDER — SODIUM CHLORIDE 0.9 % IV SOLN: INTRAVENOUS | Status: DC

## 2021-02-21 MED ORDER — DEXMEDETOMIDINE (PRECEDEX) IN NS 20 MCG/5ML (4 MCG/ML) IV SYRINGE
PREFILLED_SYRINGE | INTRAVENOUS | Status: AC
  Filled 2021-02-21: qty 5

## 2021-02-21 MED ORDER — KETOROLAC TROMETHAMINE 30 MG/ML IJ SOLN
INTRAMUSCULAR | Status: AC
  Filled 2021-02-21: qty 1

## 2021-02-21 MED ORDER — PHENYLEPHRINE HCL (PRESSORS) 10 MG/ML IV SOLN
INTRAVENOUS | Status: DC | PRN
  Administered 2021-02-21 (×4): 100 ug via INTRAVENOUS

## 2021-02-21 MED ORDER — SODIUM CHLORIDE FLUSH 0.9 % IV SOLN
INTRAVENOUS | Status: AC
  Filled 2021-02-21: qty 40

## 2021-02-21 MED ORDER — LACTATED RINGERS IV SOLN: INTRAVENOUS | Status: DC

## 2021-02-21 MED ORDER — DEXAMETHASONE SODIUM PHOSPHATE 10 MG/ML IJ SOLN
INTRAMUSCULAR | Status: DC | PRN
  Administered 2021-02-21: 10 mg via INTRAVENOUS

## 2021-02-21 MED ORDER — ENOXAPARIN SODIUM 30 MG/0.3ML ~~LOC~~ SOLN
30.0000 mg | Freq: Two times a day (BID) | SUBCUTANEOUS | Status: DC
  Administered 2021-02-22 – 2021-02-23 (×3): 30 mg via SUBCUTANEOUS
  Filled 2021-02-21 (×3): qty 0.3

## 2021-02-21 MED ORDER — METHOCARBAMOL 500 MG PO TABS
500.0000 mg | ORAL_TABLET | Freq: Four times a day (QID) | ORAL | Status: DC | PRN
  Administered 2021-02-21 – 2021-02-22 (×3): 500 mg via ORAL
  Filled 2021-02-21 (×2): qty 1

## 2021-02-21 MED ORDER — TRAMADOL HCL 50 MG PO TABS
50.0000 mg | ORAL_TABLET | Freq: Four times a day (QID) | ORAL | Status: DC
  Administered 2021-02-21 – 2021-02-23 (×9): 50 mg via ORAL
  Filled 2021-02-21 (×9): qty 1

## 2021-02-21 MED ORDER — BUPIVACAINE-EPINEPHRINE (PF) 0.25% -1:200000 IJ SOLN
INTRAMUSCULAR | Status: AC
  Filled 2021-02-21: qty 30

## 2021-02-21 MED ORDER — FLUOXETINE HCL 20 MG PO CAPS
20.0000 mg | ORAL_CAPSULE | Freq: Every morning | ORAL | Status: DC
  Administered 2021-02-22 – 2021-02-23 (×2): 20 mg via ORAL
  Filled 2021-02-21 (×2): qty 1

## 2021-02-21 MED ORDER — OXYCODONE HCL 5 MG PO TABS
10.0000 mg | ORAL_TABLET | ORAL | Status: DC | PRN
Start: 2021-02-21 — End: 2021-02-23
  Administered 2021-02-21: 15 mg via ORAL
  Administered 2021-02-21: 10 mg via ORAL
  Administered 2021-02-21 – 2021-02-22 (×3): 15 mg via ORAL
  Administered 2021-02-22: 10 mg via ORAL
  Administered 2021-02-22 – 2021-02-23 (×3): 15 mg via ORAL
  Filled 2021-02-21 (×3): qty 3
  Filled 2021-02-21: qty 2
  Filled 2021-02-21: qty 3
  Filled 2021-02-21: qty 2
  Filled 2021-02-21: qty 3
  Filled 2021-02-21: qty 2
  Filled 2021-02-21 (×2): qty 3

## 2021-02-21 MED ORDER — HYDROMORPHONE HCL 1 MG/ML IJ SOLN
INTRAMUSCULAR | Status: AC
  Administered 2021-02-21: 0.25 mg via INTRAVENOUS
  Filled 2021-02-21: qty 1

## 2021-02-21 MED ORDER — MENTHOL 3 MG MT LOZG
1.0000 | LOZENGE | OROMUCOSAL | Status: DC | PRN
  Filled 2021-02-21: qty 9

## 2021-02-21 MED ORDER — DOCUSATE SODIUM 100 MG PO CAPS
100.0000 mg | ORAL_CAPSULE | Freq: Two times a day (BID) | ORAL | Status: DC
  Administered 2021-02-21 – 2021-02-23 (×5): 100 mg via ORAL
  Filled 2021-02-21 (×4): qty 1

## 2021-02-21 MED ORDER — ORAL CARE MOUTH RINSE: 15.0000 mL | Freq: Once | OROMUCOSAL | Status: AC

## 2021-02-21 MED ORDER — METOCLOPRAMIDE HCL 5 MG/ML IJ SOLN: 5.0000 mg | Freq: Three times a day (TID) | INTRAMUSCULAR | Status: DC | PRN

## 2021-02-21 MED ORDER — PHENOL 1.4 % MT LIQD
1.0000 | OROMUCOSAL | Status: DC | PRN
  Filled 2021-02-21: qty 177

## 2021-02-21 MED ORDER — LIDOCAINE HCL (CARDIAC) PF 100 MG/5ML IV SOSY
PREFILLED_SYRINGE | INTRAVENOUS | Status: DC | PRN
  Administered 2021-02-21: 100 mg via INTRAVENOUS

## 2021-02-21 MED ORDER — ONDANSETRON HCL 4 MG/2ML IJ SOLN
4.0000 mg | Freq: Four times a day (QID) | INTRAMUSCULAR | Status: DC | PRN
  Filled 2021-02-21: qty 2

## 2021-02-21 MED ORDER — OXYCODONE HCL 5 MG PO TABS
ORAL_TABLET | ORAL | Status: AC
  Filled 2021-02-21: qty 1

## 2021-02-21 MED ORDER — CEFAZOLIN SODIUM-DEXTROSE 2-4 GM/100ML-% IV SOLN
INTRAVENOUS | Status: AC
  Filled 2021-02-21: qty 100

## 2021-02-21 MED ORDER — ACETAMINOPHEN 10 MG/ML IV SOLN
INTRAVENOUS | Status: DC | PRN
  Administered 2021-02-21: 1000 mg via INTRAVENOUS

## 2021-02-21 MED ORDER — METHOCARBAMOL 500 MG PO TABS
ORAL_TABLET | ORAL | Status: AC
  Filled 2021-02-21: qty 1

## 2021-02-21 MED ORDER — MAGNESIUM CITRATE PO SOLN
1.0000 | Freq: Once | ORAL | Status: DC | PRN
  Filled 2021-02-21: qty 296

## 2021-02-21 MED ORDER — HYDROMORPHONE HCL 1 MG/ML IJ SOLN
0.2500 mg | INTRAMUSCULAR | Status: DC | PRN
Start: 2021-02-21 — End: 2021-02-21
  Administered 2021-02-21 (×2): 0.25 mg via INTRAVENOUS

## 2021-02-21 MED ORDER — MIDAZOLAM HCL 2 MG/2ML IJ SOLN
INTRAMUSCULAR | Status: DC | PRN
  Administered 2021-02-21: 2 mg via INTRAVENOUS

## 2021-02-21 MED ORDER — CEFAZOLIN SODIUM-DEXTROSE 2-4 GM/100ML-% IV SOLN
2.0000 g | INTRAVENOUS | Status: AC
  Administered 2021-02-21: 2 g via INTRAVENOUS

## 2021-02-21 MED ORDER — FENTANYL CITRATE (PF) 100 MCG/2ML IJ SOLN
INTRAMUSCULAR | Status: AC
  Filled 2021-02-21: qty 2

## 2021-02-21 MED ORDER — SUCCINYLCHOLINE CHLORIDE 20 MG/ML IJ SOLN
INTRAMUSCULAR | Status: DC | PRN
  Administered 2021-02-21: 120 mg via INTRAVENOUS

## 2021-02-21 MED ORDER — BISACODYL 10 MG RE SUPP: 10.0000 mg | Freq: Every day | RECTAL | Status: DC | PRN

## 2021-02-21 MED ORDER — ONDANSETRON HCL 4 MG/2ML IJ SOLN
INTRAMUSCULAR | Status: DC | PRN
  Administered 2021-02-21: 4 mg via INTRAVENOUS

## 2021-02-21 MED ORDER — MAGNESIUM HYDROXIDE 400 MG/5ML PO SUSP: 30.0000 mL | Freq: Every day | ORAL | Status: DC | PRN

## 2021-02-21 MED ORDER — ACETAMINOPHEN 500 MG PO TABS
1000.0000 mg | ORAL_TABLET | Freq: Four times a day (QID) | ORAL | Status: AC
  Administered 2021-02-21 – 2021-02-22 (×4): 1000 mg via ORAL
  Filled 2021-02-21 (×4): qty 2

## 2021-02-21 MED ORDER — ALUM & MAG HYDROXIDE-SIMETH 200-200-20 MG/5ML PO SUSP: 30.0000 mL | ORAL | Status: DC | PRN

## 2021-02-21 MED ORDER — OXYCODONE HCL 5 MG PO TABS
5.0000 mg | ORAL_TABLET | ORAL | Status: DC | PRN
  Administered 2021-02-21: 5 mg via ORAL
  Administered 2021-02-22 – 2021-02-23 (×2): 10 mg via ORAL
  Filled 2021-02-21 (×2): qty 2

## 2021-02-21 MED ORDER — NEOMYCIN-POLYMYXIN B GU 40-200000 IR SOLN
Status: DC | PRN
  Administered 2021-02-21: 2 mL

## 2021-02-21 MED ORDER — BUPIVACAINE-EPINEPHRINE 0.25% -1:200000 IJ SOLN
INTRAMUSCULAR | Status: DC | PRN
  Administered 2021-02-21: 30 mL

## 2021-02-21 MED ORDER — DIPHENHYDRAMINE HCL 12.5 MG/5ML PO ELIX
12.5000 mg | ORAL_SOLUTION | ORAL | Status: DC | PRN
  Administered 2021-02-22: 12.5 mg via ORAL
  Filled 2021-02-21: qty 5

## 2021-02-21 MED ORDER — PANTOPRAZOLE SODIUM 40 MG PO TBEC
40.0000 mg | DELAYED_RELEASE_TABLET | Freq: Every day | ORAL | Status: DC
  Administered 2021-02-21 – 2021-02-23 (×3): 40 mg via ORAL
  Filled 2021-02-21 (×3): qty 1

## 2021-02-21 MED ORDER — METOCLOPRAMIDE HCL 10 MG PO TABS: 5.0000 mg | ORAL_TABLET | Freq: Three times a day (TID) | ORAL | Status: DC | PRN

## 2021-02-21 MED ORDER — FENTANYL CITRATE (PF) 100 MCG/2ML IJ SOLN
INTRAMUSCULAR | Status: AC
  Administered 2021-02-21: 50 ug via INTRAVENOUS
  Filled 2021-02-21: qty 2

## 2021-02-21 MED ORDER — PROPOFOL 10 MG/ML IV BOLUS
INTRAVENOUS | Status: DC | PRN
  Administered 2021-02-21: 150 mg via INTRAVENOUS

## 2021-02-21 MED ORDER — MORPHINE SULFATE (PF) 10 MG/ML IV SOLN
INTRAVENOUS | Status: DC | PRN
  Administered 2021-02-21: 10 mg

## 2021-02-21 MED ORDER — FAMOTIDINE 20 MG PO TABS
20.0000 mg | ORAL_TABLET | Freq: Once | ORAL | Status: AC
  Administered 2021-02-21: 20 mg via ORAL

## 2021-02-21 MED ORDER — ROCURONIUM BROMIDE 100 MG/10ML IV SOLN
INTRAVENOUS | Status: DC | PRN
  Administered 2021-02-21: 10 mg via INTRAVENOUS
  Administered 2021-02-21 (×2): 30 mg via INTRAVENOUS

## 2021-02-21 MED ORDER — MORPHINE SULFATE (PF) 10 MG/ML IV SOLN
INTRAVENOUS | Status: AC
  Filled 2021-02-21: qty 1

## 2021-02-21 MED ORDER — METHOCARBAMOL 1000 MG/10ML IJ SOLN
500.0000 mg | Freq: Four times a day (QID) | INTRAVENOUS | Status: DC | PRN
  Filled 2021-02-21: qty 5

## 2021-02-21 MED ORDER — CHLORHEXIDINE GLUCONATE 0.12 % MT SOLN
OROMUCOSAL | Status: AC
  Filled 2021-02-21: qty 15

## 2021-02-21 MED ORDER — DEXMEDETOMIDINE (PRECEDEX) IN NS 20 MCG/5ML (4 MCG/ML) IV SYRINGE
PREFILLED_SYRINGE | INTRAVENOUS | Status: DC | PRN
  Administered 2021-02-21 (×2): 4 ug via INTRAVENOUS
  Administered 2021-02-21: 8 ug via INTRAVENOUS
  Administered 2021-02-21: 4 ug via INTRAVENOUS

## 2021-02-21 MED ORDER — HYDROMORPHONE HCL 1 MG/ML IJ SOLN
0.5000 mg | INTRAMUSCULAR | Status: DC | PRN
  Administered 2021-02-22: 1 mg via INTRAVENOUS
  Filled 2021-02-21: qty 1

## 2021-02-21 MED ORDER — CEFAZOLIN SODIUM-DEXTROSE 2-4 GM/100ML-% IV SOLN
2.0000 g | Freq: Four times a day (QID) | INTRAVENOUS | Status: AC
  Administered 2021-02-21 (×2): 2 g via INTRAVENOUS
  Filled 2021-02-21 (×2): qty 100

## 2021-02-21 MED ORDER — BUPIVACAINE LIPOSOME 1.3 % IJ SUSP
INTRAMUSCULAR | Status: AC
  Filled 2021-02-21: qty 20

## 2021-02-21 MED ORDER — FAMOTIDINE 20 MG PO TABS
ORAL_TABLET | ORAL | Status: AC
  Filled 2021-02-21: qty 1

## 2021-02-21 MED ORDER — MIDAZOLAM HCL 2 MG/2ML IJ SOLN
INTRAMUSCULAR | Status: AC
  Filled 2021-02-21: qty 2

## 2021-02-21 MED ORDER — ACETAMINOPHEN 10 MG/ML IV SOLN
INTRAVENOUS | Status: AC
  Filled 2021-02-21: qty 100

## 2021-02-21 MED ORDER — SUGAMMADEX SODIUM 200 MG/2ML IV SOLN
INTRAVENOUS | Status: DC | PRN
  Administered 2021-02-21: 200 mg via INTRAVENOUS

## 2021-02-21 MED ORDER — ZOLPIDEM TARTRATE 5 MG PO TABS
5.0000 mg | ORAL_TABLET | Freq: Every evening | ORAL | Status: DC | PRN
  Filled 2021-02-21: qty 1

## 2021-02-21 MED ORDER — FENTANYL CITRATE (PF) 100 MCG/2ML IJ SOLN
25.0000 ug | INTRAMUSCULAR | Status: DC | PRN
  Administered 2021-02-21 (×2): 50 ug via INTRAVENOUS

## 2021-02-21 MED ORDER — ACETAMINOPHEN 325 MG PO TABS
325.0000 mg | ORAL_TABLET | Freq: Four times a day (QID) | ORAL | Status: DC | PRN
Start: 2021-02-22 — End: 2021-02-23

## 2021-02-21 MED ORDER — FENTANYL CITRATE (PF) 100 MCG/2ML IJ SOLN
INTRAMUSCULAR | Status: DC | PRN
  Administered 2021-02-21 (×2): 50 ug via INTRAVENOUS

## 2021-02-21 MED ORDER — KETOROLAC TROMETHAMINE 30 MG/ML IJ SOLN
INTRAMUSCULAR | Status: DC | PRN
  Administered 2021-02-21: 30 mg via INTRA_ARTICULAR

## 2021-02-21 MED ORDER — ONDANSETRON HCL 4 MG PO TABS: 4.0000 mg | ORAL_TABLET | Freq: Four times a day (QID) | ORAL | Status: DC | PRN

## 2021-02-21 MED ORDER — SODIUM CHLORIDE 0.9 % IV SOLN
INTRAVENOUS | Status: DC | PRN
  Administered 2021-02-21: 60 mL

## 2021-02-21 MED ORDER — PROPOFOL 10 MG/ML IV BOLUS
INTRAVENOUS | Status: AC
  Filled 2021-02-21: qty 40

## 2021-02-21 MED ORDER — PROPOFOL 10 MG/ML IV BOLUS
INTRAVENOUS | Status: AC
  Filled 2021-02-21: qty 20

## 2021-02-21 SURGICAL SUPPLY — 66 items
BLADE SAW 90X13X1.19 OSCILLAT (BLADE) ×2 IMPLANT
BLADE SAW 90X25X1.19 OSCILLAT (BLADE) ×2 IMPLANT
BNDG ELASTIC 6X5.8 VLCR STR LF (GAUZE/BANDAGES/DRESSINGS) ×2 IMPLANT
CANISTER SUCT 1200ML W/VALVE (MISCELLANEOUS) ×2 IMPLANT
CANISTER SUCT 3000ML PPV (MISCELLANEOUS) ×4 IMPLANT
CANISTER WOUND CARE 500ML ATS (WOUND CARE) ×2 IMPLANT
CEMENT FEMORAL COMP SZ3P LEFT (Femur) ×2 IMPLANT
CEMENT HV SMART SET (Cement) ×4 IMPLANT
CEMENT PATELLA RESURF SZ1 (Cement) ×2 IMPLANT
CHLORAPREP W/TINT 26 (MISCELLANEOUS) ×4 IMPLANT
COOLER POLAR GLACIER W/PUMP (MISCELLANEOUS) ×2 IMPLANT
COVER BACK TABLE REUSABLE LG (DRAPES) ×2 IMPLANT
COVER WAND RF STERILE (DRAPES) ×2 IMPLANT
CUFF TOURN SGL QUICK 24 (TOURNIQUET CUFF)
CUFF TOURN SGL QUICK 30 (TOURNIQUET CUFF)
CUFF TRNQT CYL 24X4X16.5-23 (TOURNIQUET CUFF) IMPLANT
CUFF TRNQT CYL 30X4X21-28X (TOURNIQUET CUFF) IMPLANT
DRAPE 3/4 80X56 (DRAPES) ×8 IMPLANT
ELECT CAUTERY BLADE 6.4 (BLADE) ×2 IMPLANT
ELECT REM PT RETURN 9FT ADLT (ELECTROSURGICAL) ×2
ELECTRODE REM PT RTRN 9FT ADLT (ELECTROSURGICAL) ×1 IMPLANT
GAUZE SPONGE 4X4 12PLY STRL (GAUZE/BANDAGES/DRESSINGS) ×2 IMPLANT
GAUZE XEROFORM 1X8 LF (GAUZE/BANDAGES/DRESSINGS) ×2 IMPLANT
GLOVE SURG ORTHO LTX SZ8 (GLOVE) ×2 IMPLANT
GLOVE SURG SYN 9.0  PF PI (GLOVE) ×1
GLOVE SURG SYN 9.0 PF PI (GLOVE) ×1 IMPLANT
GLOVE SURG UNDER LTX SZ8 (GLOVE) ×2 IMPLANT
GLOVE SURG UNDER POLY LF SZ9 (GLOVE) ×2 IMPLANT
GOWN SRG 2XL LVL 4 RGLN SLV (GOWNS) ×1 IMPLANT
GOWN STRL NON-REIN 2XL LVL4 (GOWNS) ×1
GOWN STRL REUS W/ TWL LRG LVL3 (GOWN DISPOSABLE) ×1 IMPLANT
GOWN STRL REUS W/ TWL XL LVL3 (GOWN DISPOSABLE) ×1 IMPLANT
GOWN STRL REUS W/TWL LRG LVL3 (GOWN DISPOSABLE) ×1
GOWN STRL REUS W/TWL XL LVL3 (GOWN DISPOSABLE) ×1
HOLDER FOLEY CATH W/STRAP (MISCELLANEOUS) ×2 IMPLANT
HOOD PEEL AWAY FLYTE STAYCOOL (MISCELLANEOUS) ×4 IMPLANT
INSERT TIBIAL LEFT FLEX SZ3 (Insert) ×2 IMPLANT
IV NS IRRIG 3000ML ARTHROMATIC (IV SOLUTION) ×2 IMPLANT
KIT PREVENA INCISION MGT20CM45 (CANNISTER) ×2 IMPLANT
KIT TURNOVER KIT A (KITS) ×2 IMPLANT
KNIFE SCULPS 14X20 (INSTRUMENTS) ×2 IMPLANT
MANIFOLD NEPTUNE II (INSTRUMENTS) ×2 IMPLANT
NEEDLE SPNL 18GX3.5 QUINCKE PK (NEEDLE) ×2 IMPLANT
NEEDLE SPNL 20GX3.5 QUINCKE YW (NEEDLE) ×2 IMPLANT
NS IRRIG 1000ML POUR BTL (IV SOLUTION) ×2 IMPLANT
PACK TOTAL KNEE (MISCELLANEOUS) ×2 IMPLANT
PAD WRAPON POLAR KNEE (MISCELLANEOUS) ×2 IMPLANT
PULSAVAC PLUS IRRIG FAN TIP (DISPOSABLE) ×2
SCALPEL PROTECTED #10 DISP (BLADE) ×4 IMPLANT
STAPLER SKIN PROX 35W (STAPLE) ×2 IMPLANT
STEM EXTENSION 11MMX30MM (Stem) ×2 IMPLANT
SUCTION FRAZIER HANDLE 10FR (MISCELLANEOUS) ×1
SUCTION TUBE FRAZIER 10FR DISP (MISCELLANEOUS) ×1 IMPLANT
SUT DVC 2 QUILL PDO  T11 36X36 (SUTURE) ×1
SUT DVC 2 QUILL PDO T11 36X36 (SUTURE) ×1 IMPLANT
SUT TICRON 2-0 30IN 311381 (SUTURE) IMPLANT
SUT V-LOC 90 ABS DVC 3-0 CL (SUTURE) ×2 IMPLANT
SWAB CULTURE AMIES ANAERIB BLU (MISCELLANEOUS) IMPLANT
SYR 20ML LL LF (SYRINGE) ×2 IMPLANT
SYR 50ML LL SCALE MARK (SYRINGE) ×4 IMPLANT
TIBIAL TRAY FIXED MEDACTA 0207 (Bone Implant) ×2 IMPLANT
TIP FAN IRRIG PULSAVAC PLUS (DISPOSABLE) ×1 IMPLANT
TOWEL OR 17X26 4PK STRL BLUE (TOWEL DISPOSABLE) ×2 IMPLANT
TOWER CARTRIDGE SMART MIX (DISPOSABLE) ×2 IMPLANT
TRAY FOLEY MTR SLVR 16FR STAT (SET/KITS/TRAYS/PACK) ×2 IMPLANT
WRAPON POLAR PAD KNEE (MISCELLANEOUS) ×4

## 2021-02-21 NOTE — Plan of Care (Signed)
  Problem: Education: Goal: Knowledge of General Education information will improve Description: Including pain rating scale, medication(s)/side effects and non-pharmacologic comfort measures Outcome: Progressing   Problem: Health Behavior/Discharge Planning: Goal: Ability to manage health-related needs will improve Outcome: Progressing   Problem: Clinical Measurements: Goal: Ability to maintain clinical measurements within normal limits will improve Outcome: Progressing Goal: Will remain free from infection Outcome: Progressing Goal: Diagnostic test results will improve Outcome: Progressing Goal: Respiratory complications will improve Outcome: Progressing Goal: Cardiovascular complication will be avoided Outcome: Progressing   Problem: Activity: Goal: Risk for activity intolerance will decrease Outcome: Progressing   Problem: Nutrition: Goal: Adequate nutrition will be maintained Outcome: Progressing   Problem: Coping: Goal: Level of anxiety will decrease Outcome: Progressing   Problem: Elimination: Goal: Will not experience complications related to bowel motility Outcome: Progressing Goal: Will not experience complications related to urinary retention Outcome: Progressing   Problem: Pain Managment: Goal: General experience of comfort will improve Outcome: Progressing   Problem: Skin Integrity: Goal: Risk for impaired skin integrity will decrease Outcome: Progressing   Problem: Education: Goal: Knowledge of the prescribed therapeutic regimen will improve Outcome: Progressing Goal: Individualized Educational Video(s) Outcome: Progressing   Problem: Activity: Goal: Ability to avoid complications of mobility impairment will improve Outcome: Progressing Goal: Range of joint motion will improve Outcome: Progressing   Problem: Clinical Measurements: Goal: Postoperative complications will be avoided or minimized Outcome: Progressing   Problem: Pain  Management: Goal: Pain level will decrease with appropriate interventions Outcome: Progressing   Problem: Skin Integrity: Goal: Will show signs of wound healing Outcome: Progressing

## 2021-02-21 NOTE — Op Note (Signed)
02/21/2021  9:16 AM  PATIENT:  Tracy Chase  65 y.o. female  PRE-OPERATIVE DIAGNOSIS:  Primary osteoarthritis of left knee M17.12 History of partial knee replacement Z96.659  POST-OPERATIVE DIAGNOSIS:  Primary osteoarthritis of left knee M17.12 History of partial knee replacement Z96.659  PROCEDURE:  Procedure(s) with comments: Revision, left partial to total knee (Left) - CHRIS GAINES TO ASSIST  SURGEON: Laurene Footman, MD  ASSISTANTS: Rachelle Hora, PA-C  ANESTHESIA:   general  EBL:  Total I/O In: 1000 [I.V.:1000] Out: 30 [Blood:30]  BLOOD ADMINISTERED:none  DRAINS: Incisional wound VAC   LOCAL MEDICATIONS USED:  MARCAINE    and OTHER Exparel morphine and Toradol  SPECIMEN:  No Specimen  DISPOSITION OF SPECIMEN:  N/A  COUNTS:  YES  TOURNIQUET:   Total Tourniquet Time Documented: Thigh (Right) - 64 minutes Total: Thigh (Right) - 64 minutes   IMPLANTS: Medacta GM K sphere 3+ left femur, 3 left tibia with short stem and 11 mm insert, size 1 patella, all components cemented  DICTATION: .Dragon Dictation patient was brought to the operating room and after adequate general anesthesia was obtained the left leg was prepped and draped in the usual sterile fashion.  After patient identification and timeout procedures were completed a medial parapatellar skin incision was made incorporating the prior incision followed by medial parapatellar arthrotomy.  Tourniquet was raised at the start of the case.  Inspection revealed moderate synovitis probably secondary to some polywear.  After exposing the knee the patellofemoral and lateral compartments were noted to have extensive degenerative changes.  The extra medullary tibial alignment guide was placed and proximal tibia cut carried out with 2 degrees posterior slope.  The proximal tibia cut incorporated the medial component and did not remove much bone other than that.  The metal medial component was removed without difficulty.  Next  the distal femoral drill hole was made and the intramedullary alignment guide placed with the distal femoral cut carried out with removal of the medial implant without significant bone loss.  The femur sized to a 3+ and anterior posterior and chamfer cuts made with care being taken to get appropriate rotation with the medial femoral condyle gone.  There is a large loose body posterior medially which subsequently was removed.  The posterior horns of the menisci were excised at this time and the tibia sized to a size 3 pinned in position tibial preparation carried out with proximal reaming and keel punch followed by placement of the 3+ femur and the 11 mm insert gave good stability through range of motion.  This point all trials were removed after the trochlear groove cut had been made and the above local was infiltrated throughout the periarticular tissues.  The patella was then cut and sized to a size 1 with 3 drill holes made for the pegs.  The bony surfaces were thoroughly irrigated and dried with the tibial component cemented into place first with excess cement removed the polyethylene was placed with the setscrew using a torque screwdriver the femoral component cemented in place and the knee held in extension as the patellar button was clamped.  Betadine soak was applied while the cement set following this the excess cement was removed when the part cement was hard the patella tracked well with no touch technique.  The knee was thoroughly irrigated and the tourniquet let let down at the close of the case.  The arthrotomy was repaired using a heavy Quill and 3 OV lock subcutaneously followed by skin  staples and incisional wound VAC.  PLAN OF CARE: Admit to inpatient   PATIENT DISPOSITION:  PACU - hemodynamically stable.

## 2021-02-21 NOTE — Anesthesia Procedure Notes (Signed)
Procedure Name: Intubation Date/Time: 02/21/2021 7:26 AM Performed by: Joellyn Quails, RN Pre-anesthesia Checklist: Patient identified, Patient being monitored, Timeout performed, Emergency Drugs available and Suction available Patient Re-evaluated:Patient Re-evaluated prior to induction Oxygen Delivery Method: Circle system utilized Preoxygenation: Pre-oxygenation with 100% oxygen Induction Type: IV induction Ventilation: Mask ventilation without difficulty Laryngoscope Size: Mac, 3 and McGraph Grade View: Grade I Tube type: Oral Tube size: 7.0 mm Number of attempts: 1 Airway Equipment and Method: Stylet Placement Confirmation: ETT inserted through vocal cords under direct vision,  positive ETCO2 and breath sounds checked- equal and bilateral Secured at: 21 cm Tube secured with: Tape Dental Injury: Teeth and Oropharynx as per pre-operative assessment

## 2021-02-21 NOTE — Evaluation (Signed)
Physical Therapy Evaluation Patient Details Name: Tracy Chase MRN: [id number] DOB: 07/25/1956 Today's Date: 02/21/2021   History of Present Illness  Pt is a 65 y.o. female s/p  L partial to total knee revision secondary primary OA and h/o partial knee replacement.  PMH includes htn, renal disease, renal cell CA, insomnia, back surgery, joint replacement, ventral hernia repair.  Clinical Impression  Prior to hospital admission, pt was independent with ambulation; lives with her significant other; plans to stay on main level of home; 6-7 STE no railing.  Pt reporting feeling out of it from pain medication but agreeable to PT session; pt's significant other present during session.  Tolerated LE ex's in bed fairly well with assist as needed.  Currently pt is SBA semi-supine to sitting edge of bed; CGA to min assist with transfers (using RW); and CGA ambulating a few feet with RW bed to recliner.  Pain 0/10 at rest beginning of session; pain L knee increased to 10/10 when standing and taking steps to recliner; pain L knee decreased to 5/10 at rest end of session sitting in recliner (nurse notified of pt's pain; polar care in place).  L knee AROM to 60 degrees (limited d/t L knee pain).  Pt would benefit from skilled PT to address noted impairments and functional limitations (see below for any additional details).  Upon hospital discharge, pt would benefit from [place].    Follow Up Recommendations Home health PT;Supervision for mobility/OOB    Equipment Recommendations  Rolling walker with 5" wheels;3in1 (PT)    Recommendations for Other Services OT consult     Precautions / Restrictions Precautions Precautions: Fall Precaution Comments: Wound vac Restrictions Weight Bearing Restrictions: Yes LLE Weight Bearing: Weight bearing as tolerated      Mobility  Bed Mobility Overal bed mobility: Needs Assistance Bed Mobility: Supine to Sit     Supine to sit: Supervision;HOB elevated     General  bed mobility comments: increased effort and time for pt to perform on own; vc's for technique    Transfers Overall transfer level: Needs assistance Equipment used: Rolling walker (2 wheeled) Transfers: Sit to/from Stand Sit to Stand: Min assist         General transfer comment: vc's for UE/LE placement; assist to initiate stand up to RW from bed; CGA stand to sit into recliner with vc's for L LE positioning and reaching back for recliner armrests prior to sitting  Ambulation/Gait Ambulation/Gait assistance: Min guard Gait Distance (Feet): 3 Feet (bed to recliner) Assistive device: Rolling walker (2 wheeled)   Gait velocity: decreased   General Gait Details: antalgic; decreased stance time L LE; vc's for increasing UE support through RW to offweight L LE; vc's for walker use and overall technique  Stairs            Wheelchair Mobility    Modified Rankin (Stroke Patients Only)       Balance Overall balance assessment: Needs assistance Sitting-balance support: No upper extremity supported;Feet supported Sitting balance-Leahy Scale: Good Sitting balance - Comments: steady sitting reaching within BOS   Standing balance support: Single extremity supported Standing balance-Leahy Scale: Fair Standing balance comment: pt requiring at least single UE support for static standing balance                             Pertinent Vitals/Pain Pain Assessment: 0-10 Pain Score: 5  Pain Location: L knee Pain Descriptors / Indicators: Sore;Tender;Operative site guarding  Pain Intervention(s): Limited activity within patient's tolerance;Monitored during session;Premedicated before session;Repositioned;Other (comment) (polar care applied and activated; nurse notified of pt's pain level)  Vitals (HR and O2 on room air) stable and WFL throughout treatment session.    Home Living Family/patient expects to be discharged to:: Private residence Living Arrangements:  Spouse/significant other (Not married but have same last name and have lived together for about 24 years.) Available Help at Discharge: Family Type of Home: House Home Access: Stairs to enter Entrance Stairs-Rails: None Entrance Stairs-Number of Steps: 6-7 Home Layout: Two level;Able to live on main level with bedroom/bathroom Home Equipment: None      Prior Function Level of Independence: Independent         Comments: No falls in past 6 months.     Hand Dominance        Extremity/Trunk Assessment   Upper Extremity Assessment Upper Extremity Assessment: Overall WFL for tasks assessed    Lower Extremity Assessment Lower Extremity Assessment: LLE deficits/detail (R LE WFL) LLE Deficits / Details: good L quad set strength; at least 3/5 AROM ankle DF/PF; assist required for L LE SLR LLE: Unable to fully assess due to pain    Cervical / Trunk Assessment Cervical / Trunk Assessment: Normal  Communication   Communication: No difficulties  Cognition Arousal/Alertness: Awake/alert Behavior During Therapy: WFL for tasks assessed/performed Overall Cognitive Status: Within Functional Limits for tasks assessed                                 General Comments: pt reporting feeling a little out of it from pain medication      General Comments General comments (skin integrity, edema, etc.): L LE swelling noted in general.  Nursing cleared pt for participation in physical therapy.  Pt agreeable to PT session.    Exercises Total Joint Exercises Ankle Circles/Pumps: AROM;Strengthening;Both;10 reps;Supine Quad Sets: AROM;Strengthening;Both;10 reps;Supine Short Arc Quad: AAROM;Strengthening;Left;10 reps;Supine Heel Slides: AAROM;Strengthening;Left;10 reps;Supine Hip ABduction/ADduction: AAROM;Strengthening;Left;10 reps;Supine Goniometric ROM: L knee extension AROM 8 degrees short of neutral semi-supine in bed; L knee flexion AROM 60 degrees sitting in recliner  (limited d/t pain)   Assessment/Plan    PT Assessment Patient needs continued PT services  PT Problem List Decreased strength;Decreased range of motion;Decreased activity tolerance;Decreased balance;Decreased mobility;Decreased knowledge of use of DME;Decreased knowledge of precautions;Pain;Decreased skin integrity       PT Treatment Interventions DME instruction;Gait training;Stair training;Functional mobility training;Therapeutic activities;Therapeutic exercise;Balance training;Patient/family education    PT Goals (Current goals can be found in the Care Plan section)  Acute Rehab PT Goals Patient Stated Goal: to go home PT Goal Formulation: With patient Time For Goal Achievement: 03/07/21 Potential to Achieve Goals: Good    Frequency BID   Barriers to discharge        Co-evaluation               AM-PAC PT "6 Clicks" Mobility  Outcome Measure Help needed turning from your back to your side while in a flat bed without using bedrails?: A Little Help needed moving from lying on your back to sitting on the side of a flat bed without using bedrails?: A Little Help needed moving to and from a bed to a chair (including a wheelchair)?: A Little Help needed standing up from a chair using your arms (e.g., wheelchair or bedside chair)?: A Little Help needed to walk in hospital room?: A Little Help needed climbing 3-5 steps  with a railing? : A Lot 6 Click Score: 17    End of Session Equipment Utilized During Treatment: Gait belt Activity Tolerance: Patient limited by pain Patient left: in chair;with call bell/phone within reach;with chair alarm set;with family/visitor present;with SCD's reapplied;Other (comment) (B heels floating via towel roll; polar care in place and activated) Nurse Communication: Mobility status;Precautions;Weight bearing status;Other (comment) (pt's pain status) PT Visit Diagnosis: Other abnormalities of gait and mobility (R26.89);Muscle weakness  (generalized) (M62.81);Difficulty in walking, not elsewhere classified (R26.2);Pain Pain - Right/Left: Left Pain - part of body: Knee    Time: 3662-9476 PT Time Calculation (min) (ACUTE ONLY): 50 min   Charges:   PT Evaluation $PT Eval Low Complexity: 1 Low PT Treatments $Therapeutic Exercise: 8-22 mins $Therapeutic Activity: 8-22 mins       Leitha Bleak, PT 02/21/21, 3:46 PM

## 2021-02-21 NOTE — Transfer of Care (Signed)
Immediate Anesthesia Transfer of Care Note  Patient: RHESA FORSBERG  Procedure(s) Performed: Revision, left partial to total knee (Left Knee)  Patient Location: PACU  Anesthesia Type:General  Level of Consciousness: awake, alert  and oriented  Airway & Oxygen Therapy: Patient Spontanous Breathing and Patient connected to face mask oxygen  Post-op Assessment: Report given to RN and Post -op Vital signs reviewed and stable  Post vital signs: Reviewed and stable  Last Vitals:  Vitals Value Taken Time  BP 94/48 02/21/21 0920  Temp    Pulse 81 02/21/21 0922  Resp 15 02/21/21 0922  SpO2 100 % 02/21/21 0922  Vitals shown include unvalidated device data.  Last Pain:  Vitals:   02/21/21 0627  TempSrc: Temporal  PainSc: 0-No pain         Complications: No complications documented.

## 2021-02-21 NOTE — H&P (Signed)
Chief Complaint  Patient presents with  . Pre-op Exam  schedule for Revision left partial knee 02/21/21 with Dr. Rudene Christians    History of the Present Illness: Tracy Chase is a 65 y.o. female here today for history and physical for left partial knee revision to total knee arthroplasty with Dr. Hessie Knows on 02/21/2021. The patient had a left partial knee arthroplasty preformed in 2011. Initially did well with this but over last few years has had progressive patellofemoral and lateral compartment knee pain. She continues to have persistent pain has been increasing throughout the left knee. Her last left knee x-ray was 04/13/2020 showing progression of patellofemoral degenerative change compared to her older x-rays, and she also has developed significant lateral compartment degenerative change. Pain has increased over the last few years. She is having a hard time going up and down steps as well as walking long distances without having pain and swelling throughout the knee.  [place] has [place] beach house with only 3 stairs to enter.   I have reviewed past medical, surgical, social and family history, and allergies as documented in the EMR.  Past Medical History: Past Medical History:  Diagnosis Date  . Anxiety  . Depression  . Hemorrhoids  . Hypertension  . Obesity  . Vulvar warts  Hx of viral vulvar warts, TCA tx 2014   Past Surgical History: Past Surgical History:  Procedure Laterality Date  . back surgery  . HYSTERECTOMY  LSH, ovaries remain  . partial left knee replacment 06/2010  . SPINE SURGERY  . TONSILLECTOMY  . TUBAL LIGATION   Past Family History: Family History  Problem Relation Age of Onset  . Heart disease Mother  . Diabetes Mother   Medications: Current Outpatient Medications Ordered in Epic  Medication Sig Dispense Refill  . baclofen (LIORESAL) 10 MG tablet Take 1 tablet by mouth 3 (three) times daily (Patient not taking: Reported on 02/15/2021 )  . FLUoxetine (PROZAC) 20 MG  capsule Take 20 mg by mouth once daily  . hydrocortisone (ANUSOL-HC) 2.5 % rectal cream Place 1 Application rectally 2 (two) times daily (Patient not taking: Reported on 02/15/2021 )  . triamterene-hydrochlorothiazide (MAXZIDE-25) 37.5-25 mg tablet Take 1 tablet by mouth daily Need to see primary doctor for future refills 90 tablet 0  . zolpidem (AMBIEN) 10 mg tablet Take 1 tablet (10 mg total) by mouth nightly as needed for Sleep. 30 tablet 0   No current Epic-ordered facility-administered medications on file.   Allergies: Allergies  Allergen Reactions  . Vicodin [Hydrocodone-Acetaminophen] Nausea    Body mass index is 31.54 kg/m.  Review of Systems: A comprehensive 14 point ROS was performed, reviewed, and the pertinent orthopaedic findings are documented in the HPI.  Vitals:  02/15/21 1514  BP: 118/78    General Physical Examination:   General:  Well developed, well nourished, no apparent distress, normal affect, normal gait with no antalgic component.   HEENT: Head normocephalic, atraumatic, PERRL.   Abdomen: Soft, non tender, non distended, Bowel sounds present.  Heart: Examination of the heart reveals regular, rate, and rhythm. There is no murmur noted on ascultation. There is a normal apical pulse.  Lungs: Lungs are clear to auscultation. There is no wheeze, rhonchi, or crackles. There is normal expansion of bilateral chest walls.   Musculoskeletal Examination:  Left knee shows normal knee range of motion with good stability. She has some patellofemoral crepitus. She is tender along the patellofemoral region and along the lateral joint line. She has  normal active extension. 0 to 115 degrees range of motion. No edema throughout the lower leg.  Radiographs: X-rays of the left knee reviewed by me today from 04/13/2020 shows history of partial knee replacement with no evidence of loosening or subsidence. Patient has degenerative changes in the patellofemoral and lateral  compartment that advanced. No abnormal bony lesion.  Assessment: ICD-10-CM  1. Primary osteoarthritis of left knee M17.12  2. History of partial knee replacement Z96.659   Plan: 85. 65 year old female with history of left partial knee replacement medial compartment. She initially did well but over the last few years has had progressive left knee pain interfering with quality of life and activities daily living. She has advanced arthritis in the patellofemoral and lateral compartments. Risks, benefits, complications of a left partial knee revision to total knee replacement has been discussed with the patient. Patient has agreed and consented procedure with Dr. Hessie Knows on 02/21/2021   Electronically signed by Feliberto Gottron, PA at 02/15/2021 3:22 PM EDT  Reviewed  H+P. No changes noted.

## 2021-02-21 NOTE — Addendum Note (Signed)
Addendum  created 02/21/21 1245 by Caliyah Sieh, Precious Haws, MD   Intraprocedure Event edited

## 2021-02-21 NOTE — Anesthesia Preprocedure Evaluation (Addendum)
Anesthesia Evaluation  Patient identified by MRN, date of birth, ID band Patient awake    Reviewed: Allergy & Precautions, H&P , NPO status , Patient's Chart, lab work & pertinent test results  History of Anesthesia Complications Negative for: history of anesthetic complications  Airway Mallampati: III  TM Distance: >3 FB Neck ROM: limited    Dental  (+) Chipped, Poor Dentition   Pulmonary neg pulmonary ROS, neg shortness of breath, former smoker,    Pulmonary exam normal        Cardiovascular Exercise Tolerance: Good hypertension, Normal cardiovascular exam     Neuro/Psych PSYCHIATRIC DISORDERS  Neuromuscular disease negative psych ROS   GI/Hepatic Neg liver ROS, GERD  Medicated and Controlled,  Endo/Other  negative endocrine ROS  Renal/GU Renal disease     Musculoskeletal   Abdominal   Peds  Hematology negative hematology ROS (+)   Anesthesia Other Findings Past Medical History: No date: Arthritis No date: Cancer ([place])     Comment:  RENAL CELL No date: Depression No date: GERD (gastroesophageal reflux disease)     Comment:  OCC NO MEDS No date: Insomnia  Past Surgical History: No date: ABDOMINAL HYSTERECTOMY No date: APPENDECTOMY No date: BACK SURGERY     Comment:  "rods in back" 08/28/2019: COLONOSCOPY WITH PROPOFOL; N/A     Comment:  Procedure: COLONOSCOPY WITH PROPOFOL;  Surgeon:               Virgel Manifold, MD;  Location: [facility];                Service: Endoscopy;  Laterality: N/A; 2012: cryoablation for renal cell carcinoma 09/06/2020: ETHMOIDECTOMY; Left     Comment:  Procedure: TOTAL ETHMOIDECTOMY;  Surgeon: Clyde Canterbury,              MD;  Location: [place];  Service: ENT;                Laterality: Left; 09/06/2020: FRONTAL SINUS EXPLORATION; Left     Comment:  Procedure: FRONTAL SINUS EXPLORATION;  Surgeon: Clyde Canterbury, MD;  Location: [place];  Service: ENT;               Laterality: Left; 09/06/2020: IMAGE GUIDED SINUS SURGERY; N/A     Comment:  Procedure: IMAGE GUIDED SINUS SURGERY;  Surgeon:               Clyde Canterbury, MD;  Location: [place];                Service: ENT;  Laterality: N/A;  need [employer] disk PLACE              DISK ON OR CHARGE NURSE DESK 10-01 KP No date: JOINT REPLACEMENT No date: LUMBAR FUSION 09/06/2020: MAXILLARY ANTROSTOMY; Left     Comment:  Procedure: MAXILLARY ANTROSTOMY WITH TISSUE;  Surgeon:               Clyde Canterbury, MD;  Location: [place];                Service: ENT;  Laterality: Left; No date: MEDIAL PARTIAL KNEE REPLACEMENT; Left No date: PARTIAL HYSTERECTOMY 09/06/2020: SPHENOIDECTOMY; Left     Comment:  Procedure: SPHENOIDECTOMY;  Surgeon: Clyde Canterbury, MD;               Location: [place];  Service: ENT;  Laterality: Left; No date: TONSILLECTOMY 2012: VENTRAL HERNIA REPAIR     Comment:  Dr. Genevive Bi     Reproductive/Obstetrics negative OB ROS                             Anesthesia Physical Anesthesia Plan  ASA: III  Anesthesia Plan: General ETT   Post-op Pain Management:    Induction: Intravenous  PONV Risk Score and Plan: Ondansetron, Dexamethasone, Midazolam and Treatment may vary due to age or medical condition  Airway Management Planned: Oral ETT  Additional Equipment:   Intra-op Plan:   Post-operative Plan: Extubation in OR  Informed Consent: I have reviewed the patients History and Physical, chart, labs and discussed the procedure including the risks, benefits and alternatives for the proposed anesthesia with the patient or authorized representative who has indicated his/her understanding and acceptance.     Dental Advisory Given  Plan Discussed with: Anesthesiologist, CRNA and Surgeon  Anesthesia Plan Comments: (Patient declines spinal 2/2 back surgery  history  Patient consented for risks of anesthesia including but not limited to:  - adverse reactions to medications - damage to eyes, teeth, lips or other oral mucosa - nerve damage due to positioning  - sore throat or hoarseness - Damage to heart, brain, nerves, lungs, other parts of body or loss of life  Patient voiced understanding.)       Anesthesia Quick Evaluation

## 2021-02-21 NOTE — Anesthesia Postprocedure Evaluation (Signed)
Anesthesia Post Note  Patient: Tracy Chase  Procedure(s) Performed: Revision, left partial to total knee (Left Knee)  Patient location during evaluation: PACU Anesthesia Type: General Level of consciousness: awake and alert Pain management: pain level controlled Vital Signs Assessment: post-procedure vital signs reviewed and stable Respiratory status: spontaneous breathing, nonlabored ventilation, respiratory function stable and patient connected to nasal cannula oxygen Cardiovascular status: blood pressure returned to baseline and stable Postop Assessment: no apparent nausea or vomiting   No complications documented.   Last Vitals:  Vitals:   02/21/21 1045 02/21/21 1108  BP: 116/64 120/60  Pulse: 76 63  Resp: 13 16  Temp:  37.3 C  SpO2: 96% 97%    Last Pain:  Vitals:   02/21/21 1220  TempSrc:   PainSc: 8                  Precious Haws Prabhleen Montemayor

## 2021-02-22 ENCOUNTER — Encounter: Payer: Self-pay | Admitting: Orthopedic Surgery

## 2021-02-22 LAB — CBC
HCT: 31.1 % — ABNORMAL LOW (ref 36.0–46.0)
Hemoglobin: 10.5 g/dL — ABNORMAL LOW (ref 12.0–15.0)
MCH: 30.6 pg (ref 26.0–34.0)
MCHC: 33.8 g/dL (ref 30.0–36.0)
MCV: 90.7 fL (ref 80.0–100.0)
Platelets: 184 10*3/uL (ref 150–400)
RBC: 3.43 MIL/uL — ABNORMAL LOW (ref 3.87–5.11)
RDW: 12.8 % (ref 11.5–15.5)
WBC: 8.6 10*3/uL (ref 4.0–10.5)
nRBC: 0 % (ref 0.0–0.2)

## 2021-02-22 LAB — BASIC METABOLIC PANEL
Anion gap: 8 (ref 5–15)
BUN: 7 mg/dL — ABNORMAL LOW (ref 8–23)
CO2: 30 mmol/L (ref 22–32)
Calcium: 9 mg/dL (ref 8.9–10.3)
Chloride: 101 mmol/L (ref 98–111)
Creatinine, Ser: 0.83 mg/dL (ref 0.44–1.00)
GFR, Estimated: >60 mL/min (ref >60)
Glucose, Bld: 107 mg/dL — ABNORMAL HIGH (ref 70–99)
Potassium: 4.1 mmol/L (ref 3.5–5.1)
Sodium: 139 mmol/L (ref 135–145)

## 2021-02-22 MED ORDER — MAGNESIUM HYDROXIDE 400 MG/5ML PO SUSP
30.0000 mL | Freq: Once | ORAL | Status: AC
  Administered 2021-02-22: 30 mL via ORAL
  Filled 2021-02-22: qty 30

## 2021-02-22 NOTE — Plan of Care (Signed)

## 2021-02-22 NOTE — Evaluation (Signed)
Occupational Therapy Evaluation Patient Details Name: Tracy Chase MRN: [id number] DOB: 31-May-1956 Today's Date: 02/22/2021    History of Present Illness Pt is a 65 y.o. female s/p  L partial to total knee revision secondary primary OA and h/o partial knee replacement.  PMH includes htn, renal disease, renal cell CA, insomnia, back surgery, joint replacement, ventral hernia repair.   Clinical Impression   Pt seen for OT evaluation this date in setting of acute hospitalization s/p L TK revision. Pt presents with some pain limiting functional mobility and ADL tolerance, but overall appears to be recovering well. Pt reports being INDEP at baseline including driving and performing [place]. Pt reports that she lives with her significant other-Tracy Chase-who can help out 24/7 upon d/c. CGA provided initially for sit<>stand with RW as well as MIN cues for sequence, but pt ultimately able to perform all subsequent transfers and fxl mobility throughout session including to/from commode and amb to/from restroom with SUPV only. OT educates pt and Tracy Chase re: polar care and compression stocking mgt as well as importance of gradually resuming activity including self care as soon as patient can tolerate so that she can regain INDEP. Pt performs bathing/dressing tasks standing sink-side with RW and SETUP. Requires only MIN A to don sock to L LE in sitting. Pt left in chair with all needs met and in reach and [employer] good carryover of education and handout issued. Pt could benefit from [facility] to ensure safety in the home environment including safe ADL transfers.     Follow Up Recommendations  Home health OT    Equipment Recommendations  3 in 1 bedside commode;Other (comment) (2ww)    Recommendations for Other Services       Precautions / Restrictions Precautions Precautions: Fall Precaution Comments: Wound vac Restrictions Weight Bearing Restrictions: Yes LLE Weight Bearing: Weight bearing as tolerated       Mobility Bed Mobility Overal bed mobility: Needs Assistance Bed Mobility: Supine to Sit     Supine to sit: Supervision;HOB elevated     General bed mobility comments: increased time, but overall tolerates well and needs no phyiscal assist    Transfers Overall transfer level: Needs assistance Equipment used: Rolling walker (2 wheeled) Transfers: Sit to/from Stand Sit to Stand: Min guard;Supervision         General transfer comment: CGA provided initially for safety, but pt demos good control with sit<>stand and OT ultimately only provides SUPV and education to pt's SO re: SUPV for safety in the home setting.    Balance Overall balance assessment: Needs assistance Sitting-balance support: No upper extremity supported;Feet supported Sitting balance-Tracy Chase Scale: Normal     Standing balance support: Single extremity supported Standing balance-Tracy Chase Scale: Fair Standing balance comment: able to alternate hands from RW for standing ADLs at sink, does demo some small bouts of static standing with both UEs engaging in a functional task at sink.                           ADL either performed or assessed with clinical judgement   ADL                                         General ADL Comments: Pt requires SETUP for seated and standing UB ADLs (RW sink-side for UB g/h tasks such as brushing teeth), SETUP to  MIN A For seated and standing LB ADLs (SETUP for LB bathing/drying standing sink-side with RW for support, MIN A for donning sock to L LE).     Vision Patient Visual Report: No change from baseline       Perception     Praxis      Pertinent Vitals/Pain Pain Assessment: 0-10 Pain Score: 6  Pain Location: L knee Pain Descriptors / Indicators: Sore;Tender;Operative site guarding Pain Intervention(s): Limited activity within patient's tolerance;Monitored during session;Premedicated before session     Hand Dominance     Extremity/Trunk  Assessment Upper Extremity Assessment Upper Extremity Assessment: Overall WFL for tasks assessed   Lower Extremity Assessment Lower Extremity Assessment: Defer to PT evaluation;LLE deficits/detail LLE Deficits / Details: DF/PF and hip ROM grossly appear WFL for ADLs on OT assessment, some expected decreasd tolerance for Knee ROM post-op'ly LLE: Unable to fully assess due to pain LLE Sensation: WNL LLE Coordination: WNL   Cervical / Trunk Assessment Cervical / Trunk Assessment: Normal   Communication Communication Communication: No difficulties   Cognition Arousal/Alertness: Awake/alert Behavior During Therapy: WFL for tasks assessed/performed Overall Cognitive Status: Within Functional Limits for tasks assessed                                 General Comments: pt A&O x4 and pleasant throughout session   General Comments       Exercises Other Exercises Other Exercises: OT educates pt and SO re: safety considerations, safe use of RW, polar care mgt, compression stocking mgt, importance of OOB activity, importance of performing ADLs I'ly as possible/as tolerable. Pt and spouse with good understanding.   Shoulder Instructions      Home Living Family/patient expects to be discharged to:: Private residence Living Arrangements: Spouse/significant other (Not married but have same last name and have lived together for about 24 years) Available Help at Discharge: Family Type of Home: House Home Access: Stairs to enter Technical brewer of Steps: 6-7 Entrance Stairs-Rails: None Home Layout: Two level;Able to live on main level with bedroom/bathroom Alternate Level Stairs-Number of Steps: flight of steps Alternate Level Stairs-Rails: Left Bathroom Shower/Tub: Tub/shower unit;Walk-in shower (walk in shower is on second floor)   Bathroom Toilet: Standard     Home Equipment: None          Prior Functioning/Environment Level of Independence: Independent         Comments: No falls in past 6 months.        OT Problem List: Decreased range of motion;Decreased activity tolerance;Pain;Decreased knowledge of use of DME or AE      OT Treatment/Interventions: Self-care/ADL training;DME and/or AE instruction;Therapeutic activities;Balance training;Therapeutic exercise;Patient/family education    OT Goals(Current goals can be found in the care plan section) Acute Rehab OT Goals Patient Stated Goal: to go home OT Goal Formulation: With patient Time For Goal Achievement: 03/08/21 Potential to Achieve Goals: Good ADL Goals Pt Will Transfer to Toilet: with modified independence;grab bars;ambulating (with LRAD) Pt/caregiver will Perform Home Exercise Program: Increased strength;Both right and left upper extremity;With Supervision Additional ADL Goal #1: Pt's SO will be INDEP in polar care mgt Additional ADL Goal #2: Pt's SO will be INDEP in compression stocking mgt (with "plastic bag trick" PRN)  OT Frequency: Min 1X/week   Barriers to D/C:            Co-evaluation  AM-PAC OT "6 Clicks" Daily Activity     Outcome Measure Help from another person eating meals?: None Help from another person taking care of personal grooming?: None Help from another person toileting, which includes using toliet, bedpan, or urinal?: A Little Help from another person bathing (including washing, rinsing, drying)?: A Little Help from another person to put on and taking off regular upper body clothing?: None Help from another person to put on and taking off regular lower body clothing?: A Little 6 Click Score: 21   End of Session Equipment Utilized During Treatment: Gait belt;Rolling walker  Activity Tolerance: Patient tolerated treatment well Patient left: in chair;with call bell/phone within reach;with family/visitor present  OT Visit Diagnosis: Pain;Other abnormalities of gait and mobility (R26.89) Pain - Right/Left: Left Pain - part of body:  Knee                Time: 2518-9842 OT Time Calculation (min): 44 min Charges:  OT General Charges $OT Visit: 1 Visit OT Evaluation $OT Eval Moderate Complexity: 1 Mod OT Treatments $Self Care/Home Management : 23-37 mins $Therapeutic Activity: 8-22 mins  Gerrianne Scale, MS, OTR/L ascom [phone] 02/22/21, 10:41 AM

## 2021-02-22 NOTE — Progress Notes (Signed)
Physical Therapy Treatment Patient Details Name: Tracy Chase MRN: [id number] DOB: 1956/10/13 Today's Date: 02/22/2021    History of Present Illness Pt is a 65 y.o. female s/p  L partial to total knee revision secondary primary OA and h/o partial knee replacement.  PMH includes htn, renal disease, renal cell CA, insomnia, back surgery, joint replacement, ventral hernia repair.    PT Comments    Pt seen this pm after receiving dilaudid and ms relaxer. Sit to stand from chair with Supervision. Pt c/o feeling "jittery" and concerned about steadiness.  Pt completed gait training in room with RW and close SBA for monitoring. Toilet transfers and hygiene management with ModI.  Pt declined attempting stairs this pm because of not feeling safe (pt has rock steps without rail).  Pt assisted back to bed for review of ROM exercises.  Pain level at 7/10 during session, L knee 0-85%.  Progressing towards set goals.   Follow Up Recommendations  Home health PT;Supervision for mobility/OOB     Equipment Recommendations  Rolling walker with 5" wheels;3in1 (PT)    Recommendations for Other Services OT consult     Precautions / Restrictions Precautions Precautions: Fall Precaution Comments: Wound vac Restrictions Weight Bearing Restrictions: Yes LLE Weight Bearing: Weight bearing as tolerated    Mobility  Bed Mobility Overal bed mobility: Needs Assistance Bed Mobility: Supine to Sit     Supine to sit: Supervision     General bed mobility comments: CGA for L LE    Transfers Overall transfer level: Needs assistance Equipment used: Rolling walker (2 wheeled) Transfers: Sit to/from Stand Sit to Stand: Min guard         General transfer comment:  (vc's for technique)  Ambulation/Gait Ambulation/Gait assistance: Min guard Gait Distance (Feet): 80 Feet Assistive device: Rolling walker (2 wheeled)       General Gait Details: antalgic; decreased stance time L LE; initial vc's for  increasing UE support through RW to offweight L LE; initial vc's for walker use and overall technique   Stairs Stairs: Yes (Pt declined stairs this pm due to fear of falling from pain meds)       General stair comments:  (Pt declined this pm due to fear of falling from affects from recent pain meds)   Wheelchair Mobility    Modified Rankin (Stroke Patients Only)       Balance Overall balance assessment: Needs assistance Sitting-balance support: No upper extremity supported;Feet supported Sitting balance-Leahy Scale: Normal Sitting balance - Comments: steady sitting reaching outside BOS   Standing balance support: No upper extremity supported Standing balance-Leahy Scale: Good Standing balance comment: steady static standing reaching within BOS                            Cognition Arousal/Alertness: Awake/alert Behavior During Therapy: WFL for tasks assessed/performed Overall Cognitive Status: Within Functional Limits for tasks assessed                                 General Comments: A&O x4      Exercises Total Joint Exercises Ankle Circles/Pumps: AROM;Strengthening;Both;10 reps;Supine Quad Sets: AROM;Strengthening;Both;10 reps;Supine Heel Slides: AAROM;Strengthening;Left;10 reps;Supine Long Arc Quad: AROM;Strengthening;Left;10 reps;Seated Knee Flexion: AROM;Strengthening;Left;10 reps;Seated Goniometric ROM: 0-85 General Exercises - Lower Extremity Hip Flexion/Marching: AROM;Strengthening;Both;10 reps;Seated    General Comments General comments (skin integrity, edema, etc.): L LE swelling noted in general  Pertinent Vitals/Pain Pain Assessment: 0-10 Pain Score: 7  Pain Location: L knee Pain Descriptors / Indicators: Sore;Tender;Operative site guarding    Home Living                      Prior Function            PT Goals (current goals can now be found in the care plan section) Acute Rehab PT Goals Patient  Stated Goal: to go home PT Goal Formulation: With patient Time For Goal Achievement: 03/07/21 Potential to Achieve Goals: Good Progress towards PT goals: Progressing toward goals    Frequency    BID      PT Plan Current plan remains appropriate    Co-evaluation              AM-PAC PT "6 Clicks" Mobility   Outcome Measure  Help needed turning from your back to your side while in a flat bed without using bedrails?: A Little Help needed moving from lying on your back to sitting on the side of a flat bed without using bedrails?: A Little Help needed moving to and from a bed to a chair (including a wheelchair)?: A Little Help needed standing up from a chair using your arms (e.g., wheelchair or bedside chair)?: A Little Help needed to walk in hospital room?: A Little Help needed climbing 3-5 steps with a railing? : A Little 6 Click Score: 18    End of Session Equipment Utilized During Treatment: Gait belt Activity Tolerance: Patient limited by pain Patient left: in bed;in CPM;with call bell/phone within reach;with bed alarm set Nurse Communication: Mobility status;Precautions;Weight bearing status;Other (comment);Patient requests pain meds (pt's pain status) PT Visit Diagnosis: Other abnormalities of gait and mobility (R26.89);Muscle weakness (generalized) (M62.81);Difficulty in walking, not elsewhere classified (R26.2);Pain Pain - Right/Left: Left Pain - part of body: Knee     Time: 1430-1500 PT Time Calculation (min) (ACUTE ONLY): 30 min  Charges:  $Gait Training: 8-22 mins $Therapeutic Exercise: 8-22 mins $Therapeutic Activity: 8-22 mins                     Tracy Chase, PTA   Tracy Chase 02/22/2021, 3:14 PM

## 2021-02-22 NOTE — Progress Notes (Signed)
Physical Therapy Treatment Patient Details Name: Tracy Chase MRN: [id number] DOB: 07-07-1956 Today's Date: 02/22/2021    History of Present Illness Pt is a 65 y.o. female s/p  L partial to total knee revision secondary primary OA and h/o partial knee replacement.  PMH includes htn, renal disease, renal cell CA, insomnia, back surgery, joint replacement, ventral hernia repair.    PT Comments    Pt resting in recliner upon PT arrival; pt reporting 8-9/10 L knee pain but agreeable to PT session.  CGA with transfers using RW and CGA with ambulating 100 feet with RW (limited distance d/t L knee pain).  L knee flexion AROM to 80 degrees in sitting.  Pt reporting 9/10 L knee pain end of session; nurse notified and gave pt IV pain medication and pt's pain 0/10 prior to therapist leaving pt's room.  Will continue to focus on strengthening, knee ROM, progressive ambulation, and trial stairs during hospitalization.    Follow Up Recommendations  Home health PT;Supervision for mobility/OOB     Equipment Recommendations  Rolling walker with 5" wheels;3in1 (PT)    Recommendations for Other Services OT consult     Precautions / Restrictions Precautions Precautions: Fall Precaution Comments: Wound vac Restrictions Weight Bearing Restrictions: Yes LLE Weight Bearing: Weight bearing as tolerated    Mobility  Bed Mobility         General bed mobility comments: Deferred (pt in recliner beginning/end of session)    Transfers Overall transfer level: Needs assistance Equipment used: Rolling walker (2 wheeled) Transfers: Sit to/from Stand Sit to Stand: Min guard         General transfer comment: occasional vc's for technique and LE positioning; x1 trial standing from recliner  Ambulation/Gait Ambulation/Gait assistance: Min guard Gait Distance (Feet): 100 Feet Assistive device: Rolling walker (2 wheeled)   Gait velocity: decreased   General Gait Details: antalgic; decreased stance  time L LE; initial vc's for increasing UE support through RW to offweight L LE; initial vc's for walker use and overall technique   Stairs             Wheelchair Mobility    Modified Rankin (Stroke Patients Only)       Balance Overall balance assessment: Needs assistance Sitting-balance support: No upper extremity supported;Feet supported Sitting balance-Leahy Scale: Normal Sitting balance - Comments: steady sitting reaching outside BOS   Standing balance support: No upper extremity supported Standing balance-Leahy Scale: Good Standing balance comment: steady static standing reaching within BOS                            Cognition Arousal/Alertness: Awake/alert Behavior During Therapy: WFL for tasks assessed/performed Overall Cognitive Status: Within Functional Limits for tasks assessed                                 General Comments: A&O x4      Exercises Total Joint Exercises Ankle Circles/Pumps: AROM;Strengthening;Both;10 reps;Supine Quad Sets: AROM;Strengthening;Both;10 reps;Supine Long Arc Quad: AROM;Strengthening;Left;10 reps;Seated Knee Flexion: AROM;Strengthening;Left;10 reps;Seated Goniometric ROM: L knee extension AROM 5 degrees short of neutral semi-supine in recliner; L knee flexion AROM 80 degrees sitting in recliner (limited d/t L knee pain) General Exercises - Lower Extremity Hip Flexion/Marching: AROM;Strengthening;Both;10 reps;Seated    General Comments General comments (skin integrity, edema, etc.): L LE swelling noted in general.  Pt agreeable to PT session.  Pertinent Vitals/Pain Pain Assessment: 0-10 Pain Score: 0-No pain Pain Location: L knee Pain Descriptors / Indicators: Sore;Tender;Operative site guarding Pain Intervention(s): Limited activity within patient's tolerance;Monitored during session;RN gave pain meds during session;Repositioned (polar care applied and activated)  Vitals (HR and O2 on room air)  stable and WFL throughout treatment session.    Home Living       Prior Function        PT Goals (current goals can now be found in the care plan section) Acute Rehab PT Goals Patient Stated Goal: to go home PT Goal Formulation: With patient Time For Goal Achievement: 03/07/21 Potential to Achieve Goals: Good Progress towards PT goals: Progressing toward goals    Frequency    BID      PT Plan Current plan remains appropriate    Co-evaluation              AM-PAC PT "6 Clicks" Mobility   Outcome Measure  Help needed turning from your back to your side while in a flat bed without using bedrails?: A Little Help needed moving from lying on your back to sitting on the side of a flat bed without using bedrails?: A Little Help needed moving to and from a bed to a chair (including a wheelchair)?: A Little Help needed standing up from a chair using your arms (e.g., wheelchair or bedside chair)?: A Little Help needed to walk in hospital room?: A Little Help needed climbing 3-5 steps with a railing? : A Little 6 Click Score: 18    End of Session Equipment Utilized During Treatment: Gait belt Activity Tolerance: Patient limited by pain Patient left: in chair;with call bell/phone within reach;with family/visitor present;with SCD's reapplied;Other (comment) (chair alarm pad noted not to be in chair after pt was set up in recliner: nurse present and aware; nurse reporting pt did not need chair alarm in chair and pt verbalizing understanding not to get up on own) Nurse Communication: Mobility status;Precautions;Weight bearing status;Other (comment);Patient requests pain meds (pt's pain status) PT Visit Diagnosis: Other abnormalities of gait and mobility (R26.89);Muscle weakness (generalized) (M62.81);Difficulty in walking, not elsewhere classified (R26.2);Pain Pain - Right/Left: Left Pain - part of body: Knee     Time: 1020-1058 PT Time Calculation (min) (ACUTE ONLY): 38  min  Charges:  $Gait Training: 8-22 mins $Therapeutic Exercise: 8-22 mins $Therapeutic Activity: 8-22 mins                     Leitha Bleak, PT 02/22/21, 11:30 AM

## 2021-02-22 NOTE — Progress Notes (Signed)
Met with the patient at the bedside, She lives at home with her Significant other, she needs a RW nd a 3 in 1, Notified Rhonda at [employer] She is set up with [facility] for [facility] PT She has transportation and can afford her meds that she gets at [facility] on [address]

## 2021-02-22 NOTE — Progress Notes (Signed)
   Subjective: 1 Day Post-Op Procedure(s) (LRB): Revision, left partial to total knee (Left) Patient reports pain as severe.   Patient is well, and has had no acute complaints or problems Denies any CP, SOB, ABD pain. We will continue therapy today.  Plan is to go Home after hospital stay.  Objective: Vital signs in last 24 hours: Temp:  [97.3 F (36.3 C)-99.1 F (37.3 C)] 97.9 F (36.6 C) (04/13 0454) Pulse Rate:  [62-82] 68 (04/13 0454) Resp:  [13-20] 17 (04/13 0454) BP: (100-120)/(50-67) 115/54 (04/13 0454) SpO2:  [93 %-100 %] 98 % (04/13 0454)  Intake/Output from previous day: 04/12 0701 - 04/13 0700 In: 1697.9 [P.O.:60; I.V.:1537.9; IV Piggyback:100] Out: 630 [Urine:600; Blood:30] Intake/Output this shift: No intake/output data recorded.  Recent Labs    02/21/21 1212 02/22/21 0401  HGB 11.8* 10.5*   Recent Labs    02/21/21 1212 02/22/21 0401  WBC 7.5 8.6  RBC 3.86* 3.43*  HCT 34.1* 31.1*  PLT 161 184   Recent Labs    02/21/21 1212 02/22/21 0401  NA  --  139  K  --  4.1  CL  --  101  CO2  --  30  BUN  --  7*  CREATININE 0.81 0.83  GLUCOSE  --  107*  CALCIUM  --  9.0   No results for input(s): LABPT, INR in the last 72 hours.  EXAM General - Patient is Alert, Appropriate and Oriented Extremity - Neurovascular intact Sensation intact distally Intact pulses distally Dorsiflexion/Plantar flexion intact No cellulitis present Compartment soft Dressing - dressing C/D/I and no drainage Motor Function - intact, moving foot and toes well on exam.   Past Medical History:  Diagnosis Date  . Arthritis   . Cancer (HCC)    RENAL CELL  . Depression   . GERD (gastroesophageal reflux disease)    OCC NO MEDS  . Insomnia     Assessment/Plan:   1 Day Post-Op Procedure(s) (LRB): Revision, left partial to total knee (Left) Active Problems:   S/P TKR (total knee replacement) using cement, left  Estimated body mass index is 31.81 kg/m as calculated  from the following:   Height as of 02/13/21: 5\' 6"  (1.676 m).   Weight as of 02/13/21: 89.4 kg. Advance diet Up with therapy  Labs and VSS Work on BM CM to assist with discharge  DVT Prophylaxis - Lovenox, TED hose and SCDs Weight-Bearing as tolerated to left leg   T. Rachelle Hora, PA-C [place] 02/22/2021, 8:05 AM

## 2021-02-23 MED ORDER — ACETAMINOPHEN 500 MG PO TABS: 500.0000 mg | ORAL_TABLET | Freq: Four times a day (QID) | ORAL | Status: DC | PRN

## 2021-02-23 MED ORDER — ENOXAPARIN SODIUM 40 MG/0.4ML ~~LOC~~ SOLN: 40.0000 mg | SUBCUTANEOUS | 0 refills | Status: DC

## 2021-02-23 MED ORDER — METHOCARBAMOL 500 MG PO TABS: 500.0000 mg | ORAL_TABLET | Freq: Four times a day (QID) | ORAL | 0 refills | Status: DC | PRN

## 2021-02-23 MED ORDER — DOCUSATE SODIUM 100 MG PO CAPS: 100.0000 mg | ORAL_CAPSULE | Freq: Two times a day (BID) | ORAL | 0 refills | Status: DC

## 2021-02-23 MED ORDER — OXYCODONE HCL 5 MG PO TABS: 5.0000 mg | ORAL_TABLET | ORAL | 0 refills | Status: DC | PRN

## 2021-02-23 MED ORDER — TRAMADOL HCL 50 MG PO TABS: 50.0000 mg | ORAL_TABLET | Freq: Four times a day (QID) | ORAL | 0 refills | Status: DC | PRN

## 2021-02-23 NOTE — Discharge Summary (Signed)
Physician Discharge Summary  Patient ID: Tracy Chase MRN: [id number] DOB/AGE: 1956/10/29 65 y.o.  Admit date: 02/21/2021 Discharge date: 02/23/2021  Admission Diagnoses:  S/P TKR (total knee replacement) using cement, left [Z96.652]   Discharge Diagnoses: Patient Active Problem List   Diagnosis Date Noted  . S/P TKR (total knee replacement) using cement, left 02/21/2021  . Mixed hyperlipidemia 09/25/2019  . Gastroesophageal reflux disease 09/23/2019  . Encounter for screening colonoscopy   . Polyp of colon   . Bone spur of foot 03/19/2018  . Degenerative disc disease, cervical 11/18/2017  . Generalized anxiety disorder 06/21/2015  . History of renal cell cancer 06/21/2015  . Carpal tunnel syndrome 06/21/2015  . Essential (primary) hypertension 06/21/2015  . Idiopathic insomnia 06/21/2015    Past Medical History:  Diagnosis Date  . Arthritis   . Cancer (HCC)    RENAL CELL  . Depression   . GERD (gastroesophageal reflux disease)    OCC NO MEDS  . Insomnia      Transfusion: none   Consultants (if any):   Discharged Condition: Improved  Hospital Course: Tracy Chase is an 65 y.o. female who was admitted 02/21/2021 with a diagnosis of left knee osteoathritis and went to the operating room on 02/21/2021 and underwent the above named procedures.    Surgeries: Procedure(s): Revision, left partial to total knee on 02/21/2021 Patient tolerated the surgery well. Taken to PACU where she was stabilized and then transferred to the orthopedic floor.  Started on Lovenox 30 mg q 12 hrs. Foot pumps applied bilaterally at 80 mm. Heels elevated on bed with rolled towels. No evidence of DVT. Negative Homan. Physical therapy started on day #1 for gait training and transfer. OT started day #1 for ADL and assisted devices.  Patient's foley was d/c on day #1. Patient's IV was d/c on day #2.  On post op day #2 patient was stable and ready for discharge to home with HHPT.  Implants:   Medacta GM K sphere 3+ left femur, 3 left tibia with short stem and 11 mm insert, size 1 patella, all components cemented  She was given perioperative antibiotics:  Anti-infectives (From admission, onward)   Start     Dose/Rate Route Frequency Ordered Stop   02/21/21 1245  ceFAZolin (ANCEF) IVPB 2g/100 mL premix        2 g 200 mL/hr over 30 Minutes Intravenous Every 6 hours 02/21/21 1145 02/21/21 1855   02/21/21 0636  ceFAZolin (ANCEF) 2-4 GM/100ML-% IVPB       Note to Pharmacy: [place], [place]   : cabinet override      02/21/21 0636 02/21/21 0733   02/21/21 0600  ceFAZolin (ANCEF) IVPB 2g/100 mL premix        2 g 200 mL/hr over 30 Minutes Intravenous On call to O.R. 02/21/21 6433 02/21/21 0725    .  She was given sequential compression devices, early ambulation, and Lovenox TEDs for DVT prophylaxis.  She benefited maximally from the hospital stay and there were no complications.    Recent vital signs:  Vitals:   02/23/21 0409 02/23/21 0755  BP: 127/69 115/62  Pulse: 93 82  Resp: 18 14  Temp: 98.5 F (36.9 C) 98.5 F (36.9 C)  SpO2: 99% 96%    Recent laboratory studies:  Lab Results  Component Value Date   HGB 10.5 (L) 02/22/2021   HGB 11.8 (L) 02/21/2021   HGB 13.1 02/13/2021   Lab Results  Component Value Date   WBC  8.6 02/22/2021   PLT 184 02/22/2021   No results found for: INR Lab Results  Component Value Date   NA 139 02/22/2021   K 4.1 02/22/2021   CL 101 02/22/2021   CO2 30 02/22/2021   BUN 7 (L) 02/22/2021   CREATININE 0.83 02/22/2021   GLUCOSE 107 (H) 02/22/2021    Discharge Medications:   Allergies as of 02/23/2021      Reactions   Hydrocodone Itching      Medication List    TAKE these medications   acetaminophen 500 MG tablet Commonly known as: TYLENOL Take 1-2 tablets (500-1,000 mg total) by mouth every 6 (six) hours as needed for mild pain (pain score 1-3 or temp > 100.5).   amoxicillin 500 MG tablet Commonly known as: AMOXIL Take  2,000 mg by mouth See admin instructions. Take 4 capsules (2000 mg) by mouth 1 hour prior to dental procedures   docusate sodium 100 MG capsule Commonly known as: COLACE Take 1 capsule (100 mg total) by mouth 2 (two) times daily.   enoxaparin 40 MG/0.4ML injection Commonly known as: Lovenox Inject 0.4 mLs (40 mg total) into the skin daily for 14 days.   FLUoxetine 20 MG capsule Commonly known as: PROZAC TAKE 1 CAPSULE BY MOUTH  DAILY What changed: when to take this   lactobacillus acidophilus Tabs tablet Take 1 tablet by mouth daily.   methocarbamol 500 MG tablet Commonly known as: ROBAXIN Take 1 tablet (500 mg total) by mouth every 6 (six) hours as needed for muscle spasms.   oxyCODONE 5 MG immediate release tablet Commonly known as: Oxy IR/ROXICODONE Take 1-2 tablets (5-10 mg total) by mouth every 4 (four) hours as needed for moderate pain (pain score 4-6).   traMADol 50 MG tablet Commonly known as: ULTRAM Take 1 tablet (50 mg total) by mouth every 6 (six) hours as needed.   triamterene-hydrochlorothiazide 37.5-25 MG tablet Commonly known as: MAXZIDE-25 TAKE 1 TABLET BY MOUTH  DAILY What changed: when to take this   UNABLE TO FIND Take 1 capsule by mouth in the morning. SeroVital Anti-Aging Renewal Complex   zolpidem 10 MG tablet Commonly known as: AMBIEN Take 1 tablet (10 mg total) by mouth at bedtime.            Durable Medical Equipment  (From admission, onward)         Start     Ordered   02/21/21 1146  DME Walker rolling  Once       Question Answer Comment  Walker: With 5 Inch Wheels   Patient needs a walker to treat with the following condition S/P TKR (total knee replacement) using cement, left      02/21/21 1145   02/21/21 1146  DME 3 n 1  Once        02/21/21 1145   02/21/21 1146  DME Bedside commode  Once       Question:  Patient needs a bedside commode to treat with the following condition  Answer:  S/P TKR (total knee replacement) using  cement, left   02/21/21 1145          Diagnostic Studies: DG Knee 1-2 Views Left  Result Date: 02/21/2021 CLINICAL DATA:  Status post left knee replacement. EXAM: LEFT KNEE - 1-2 VIEW COMPARISON:  Left lower leg dated 04/20/2012 FINDINGS: Interval left total knee prosthesis in satisfactory position and alignment. No fracture or dislocation seen. Overlying skin clips. IMPRESSION: Satisfactory postoperative appearance of a left total knee  prosthesis. Electronically Signed   By: Claudie Revering M.D.   On: 02/21/2021 09:59    Disposition:      Follow-up Information    Duanne Guess, PA-C Follow up in 2 week(s).   Specialties: Orthopedic Surgery, Emergency Medicine Contact information: [place] [address] [phone]                Signed: Feliberto Gottron 02/23/2021, 11:24 AM

## 2021-02-23 NOTE — Progress Notes (Signed)
PT Cancellation Note  Patient Details Name: Tracy Chase MRN: [id number] DOB: 10/13/1956   Cancelled Treatment:    Reason Eval/Treat Not Completed: Other (comment)   Offered BID session.  Pt stated she felt comfortable with mobility skills.  Did ask me to relay to RN request for pain medication.     Chesley Noon 02/23/2021, 12:28 PM

## 2021-02-23 NOTE — Discharge Instructions (Signed)

## 2021-02-23 NOTE — Progress Notes (Signed)
Occupational Therapy Treatment Patient Details Name: Tracy Chase MRN: [id number] DOB: May 14, 1956 Today's Date: 02/23/2021    History of present illness Pt is a 65 y.o. female s/p  L partial to total knee revision secondary primary OA and h/o partial knee replacement.  PMH includes htn, renal disease, renal cell CA, insomnia, back surgery, joint replacement, ventral hernia repair.   OT comments  Pt seen for OT treatment this date to f/u re: safety with ADLs/ADL mobility post-op'ly as well as general post-op care considerations. Pt and her significant other, [name] are attentive throughout and ask appropriate questions. Pt demos ability to perform most LB ADLs with SETUP with exception of donning compression stockings which [name] demos good understanding of how to safely assist pt. OT facilitates f/u education with pt and SO re: positioning, polar care mgt, compression stocking wear and keeping dressing clean and dry. Pt and [name] both with good understanding. All acute f/u OT needs met this session.   Follow Up Recommendations  No OT follow up    Equipment Recommendations  3 in 1 bedside commode;Other (comment) (2ww)    Recommendations for Other Services      Precautions / Restrictions Precautions Precautions: Fall Precaution Comments: Wound vac Restrictions Weight Bearing Restrictions: Yes LLE Weight Bearing: Weight bearing as tolerated       Mobility Bed Mobility   General bed mobility comments: up to chair pre/post    Transfers       General transfer comment: deferred    Balance           ADL either performed or assessed with clinical judgement   ADL                       General ADL Comments: requires increased time and SETUP to perform all LB ADLs and bends from the waist to reach LEs, but overall able to perform without physical assistance except for compression stocking.     Vision Patient Visual Report: No change from baseline     Perception      Praxis      Cognition Arousal/Alertness: Awake/alert Behavior During Therapy: WFL for tasks assessed/performed Overall Cognitive Status: Within Functional Limits for tasks assessed             General Comments: A&O x4        Exercises  Other Exercises: OT facilitates f/u education with pt and SO re: positioning, polar care mgt, compression stocking mgt and keeping dressing clean and dry. Pt and [name] both with good understanding.   Shoulder Instructions       General Comments      Pertinent Vitals/ Pain       Pain Assessment: Faces Faces Pain Scale: Hurts even more Pain Location: L knee Pain Descriptors / Indicators: Sore;Tender;Operative site guarding (reports slightly more sore after walking with PT, but still tolerable) Pain Intervention(s): Limited activity within patient's tolerance;Monitored during session;Ice applied  Home Living                      Prior Functioning/Environment              Frequency           Progress Toward Goals  OT Goals(current goals can now be found in the care plan section)  Progress towards OT goals: Goals met/education completed, patient discharged from OT  Acute Rehab OT Goals Patient Stated Goal: to go home OT Goal Formulation: All assessment  and education complete, DC therapy  Plan      Co-evaluation                 AM-PAC OT "6 Clicks" Daily Activity     Outcome Measure   Help from another person eating meals?: None Help from another person taking care of personal grooming?: None Help from another person toileting, which includes using toliet, bedpan, or urinal?: A Little Help from another person bathing (including washing, rinsing, drying)?: A Little Help from another person to put on and taking off regular upper body clothing?: None Help from another person to put on and taking off regular lower body clothing?: A Little 6 Click Score: 21    End of Session    OT Visit Diagnosis:  Pain;Other abnormalities of gait and mobility (R26.89) Pain - Right/Left: Left Pain - part of body: Knee   Activity Tolerance Patient tolerated treatment well   Patient Left in chair;with call bell/phone within reach;with family/visitor present   Nurse Communication          Time: 3543-0148 OT Time Calculation (min): 12 min  Charges: OT General Charges $OT Visit: 1 Visit OT Treatments $Self Care/Home Management : 8-22 mins  Gerrianne Scale, MS, OTR/L ascom [phone] 02/23/21, 1:45 PM

## 2021-02-23 NOTE — Plan of Care (Signed)

## 2021-02-23 NOTE — Plan of Care (Signed)
Problem: Education: Goal: Knowledge of General Education information will improve Description: Including pain rating scale, medication(s)/side effects and non-pharmacologic comfort measures 02/23/2021 1322 by Jules Schick, RN Outcome: Adequate for Discharge 02/23/2021 0927 by Jules Schick, RN Outcome: Progressing   Problem: Health Behavior/Discharge Planning: Goal: Ability to manage health-related needs will improve 02/23/2021 1322 by Jules Schick, RN Outcome: Adequate for Discharge 02/23/2021 0927 by Jules Schick, RN Outcome: Progressing   Problem: Clinical Measurements: Goal: Ability to maintain clinical measurements within normal limits will improve 02/23/2021 1322 by Jules Schick, RN Outcome: Adequate for Discharge 02/23/2021 0927 by Jules Schick, RN Outcome: Progressing Goal: Will remain free from infection 02/23/2021 1322 by Jules Schick, RN Outcome: Adequate for Discharge 02/23/2021 0927 by Jules Schick, RN Outcome: Progressing Goal: Diagnostic test results will improve 02/23/2021 1322 by Jules Schick, RN Outcome: Adequate for Discharge 02/23/2021 0927 by Jules Schick, RN Outcome: Progressing Goal: Respiratory complications will improve 02/23/2021 1322 by Jules Schick, RN Outcome: Adequate for Discharge 02/23/2021 0927 by Jules Schick, RN Outcome: Progressing Goal: Cardiovascular complication will be avoided 02/23/2021 1322 by Jules Schick, RN Outcome: Adequate for Discharge 02/23/2021 0927 by Jules Schick, RN Outcome: Progressing   Problem: Activity: Goal: Risk for activity intolerance will decrease 02/23/2021 1322 by Jules Schick, RN Outcome: Adequate for Discharge 02/23/2021 0927 by Jules Schick, RN Outcome: Progressing   Problem: Nutrition: Goal: Adequate nutrition will be maintained 02/23/2021 1322 by Jules Schick, RN Outcome: Adequate for Discharge 02/23/2021 0927 by Jules Schick, RN Outcome: Progressing   Problem: Coping: Goal: Level of anxiety will decrease 02/23/2021 1322 by Jules Schick, RN Outcome: Adequate for Discharge 02/23/2021 0927 by Jules Schick, RN Outcome: Progressing   Problem: Elimination: Goal: Will not experience complications related to bowel motility 02/23/2021 1322 by Jules Schick, RN Outcome: Adequate for Discharge 02/23/2021 0927 by Jules Schick, RN Outcome: Progressing Goal: Will not experience complications related to urinary retention 02/23/2021 1322 by Jules Schick, RN Outcome: Adequate for Discharge 02/23/2021 [place] by Jules Schick, RN Outcome: Progressing   Problem: Pain Managment: Goal: General experience of comfort will improve 02/23/2021 1322 by Jules Schick, RN Outcome: Adequate for Discharge 02/23/2021 0927 by Jules Schick, RN Outcome: Progressing   Problem: Safety: Goal: Ability to remain free from injury will improve 02/23/2021 1322 by Jules Schick, RN Outcome: Adequate for Discharge 02/23/2021 0927 by Jules Schick, RN Outcome: Progressing   Problem: Skin Integrity: Goal: Risk for impaired skin integrity will decrease 02/23/2021 1322 by Jules Schick, RN Outcome: Adequate for Discharge 02/23/2021 0927 by Jules Schick, RN Outcome: Progressing   Problem: Education: Goal: Knowledge of the prescribed therapeutic regimen will improve 02/23/2021 1322 by Jules Schick, RN Outcome: Adequate for Discharge 02/23/2021 0927 by Jules Schick, RN Outcome: Progressing Goal: Individualized Educational Video(s) 02/23/2021 1322 by Jules Schick, RN Outcome: Adequate for Discharge 02/23/2021 0927 by Jules Schick, RN Outcome: Progressing   Problem: Activity: Goal: Ability to avoid complications of mobility impairment will improve 02/23/2021 1322 by Jules Schick, RN Outcome: Adequate for Discharge 02/23/2021 0927 by Jules Schick, RN Outcome:  Progressing Goal: Range of joint motion will improve 02/23/2021 1322 by Jules Schick, RN Outcome: Adequate for Discharge 02/23/2021 0927 by Jules Schick, RN Outcome: Progressing   Problem: Clinical Measurements: Goal: Postoperative complications will be avoided or minimized 02/23/2021 1322 by Jules Schick, RN Outcome: Adequate for Discharge 02/23/2021 0927 by Jules Schick, RN Outcome: Progressing   Problem: Pain Management: Goal: Pain level will decrease with appropriate interventions 02/23/2021 1322 by Jules Schick, RN Outcome: Adequate for  Discharge 02/23/2021 0927 by Jules Schick, RN Outcome: Progressing   Problem: Skin Integrity: Goal: Will show signs of wound healing 02/23/2021 1322 by Jules Schick, RN Outcome: Adequate for Discharge 02/23/2021 0927 by Jules Schick, RN Outcome: Progressing

## 2021-02-23 NOTE — Progress Notes (Signed)
   Subjective: 2 Days Post-Op Procedure(s) (LRB): Revision, left partial to total knee (Left) Patient reports pain as mild.   Patient is well, and has had no acute complaints or problems Denies any CP, SOB, ABD pain. We will continue therapy today.  Plan is to go Home after hospital stay.  Objective: Vital signs in last 24 hours: Temp:  [97.7 F (36.5 C)-99.3 F (37.4 C)] 98.5 F (36.9 C) (04/14 0755) Pulse Rate:  [61-93] 82 (04/14 0755) Resp:  [14-19] 14 (04/14 0755) BP: (115-131)/(50-69) 115/62 (04/14 0755) SpO2:  [95 %-99 %] 96 % (04/14 0755)  Intake/Output from previous day: 04/13 0701 - 04/14 0700 In: 840 [P.O.:840] Out: -  Intake/Output this shift: Total I/O In: 240 [P.O.:240] Out: -   Recent Labs    02/21/21 1212 02/22/21 0401  HGB 11.8* 10.5*   Recent Labs    02/21/21 1212 02/22/21 0401  WBC 7.5 8.6  RBC 3.86* 3.43*  HCT 34.1* 31.1*  PLT 161 184   Recent Labs    02/21/21 1212 02/22/21 0401  NA  --  139  K  --  4.1  CL  --  101  CO2  --  30  BUN  --  7*  CREATININE 0.81 0.83  GLUCOSE  --  107*  CALCIUM  --  9.0   No results for input(s): LABPT, INR in the last 72 hours.  EXAM General - Patient is Alert, Appropriate and Oriented Extremity - Neurovascular intact Sensation intact distally Intact pulses distally Dorsiflexion/Plantar flexion intact No cellulitis present Compartment soft Dressing - dressing C/D/I and no drainage, prevena intact with out drainage Motor Function - intact, moving foot and toes well on exam.   Past Medical History:  Diagnosis Date  . Arthritis   . Cancer (HCC)    RENAL CELL  . Depression   . GERD (gastroesophageal reflux disease)    OCC NO MEDS  . Insomnia     Assessment/Plan:   2 Days Post-Op Procedure(s) (LRB): Revision, left partial to total knee (Left) Active Problems:   S/P TKR (total knee replacement) using cement, left  Estimated body mass index is 31.81 kg/m as calculated from the  following:   Height as of 02/13/21: 5\' 6"  (1.676 m).   Weight as of 02/13/21: 89.4 kg. Advance diet Up with therapy  Labs and VSS Pain well controlled CM to assist with discharge to home with HHPT today  DVT Prophylaxis - Lovenox, TED hose and SCDs Weight-Bearing as tolerated to left leg   T. Rachelle Hora, PA-C [place] 02/23/2021, 11:20 AM

## 2021-02-23 NOTE — Progress Notes (Signed)
Pt discharged home with husband. All instructions provided with questions answered.   BP (!) 142/95 (BP Location: Right Arm)   Pulse 98   Temp 98.3 F (36.8 C)   Resp 16   SpO2 100%

## 2021-02-23 NOTE — Progress Notes (Signed)
Physical Therapy Treatment Patient Details Name: LAURALEE WATERS MRN: [id number] DOB: 11-02-1956 Today's Date: 02/23/2021    History of Present Illness Pt is a 65 y.o. female s/p  L partial to total knee revision secondary primary OA and h/o partial knee replacement.  PMH includes htn, renal disease, renal cell CA, insomnia, back surgery, joint replacement, ventral hernia repair.    PT Comments    Pt ready for session.  Pre medicated.  Participated in exercises as described below.  OOB with min a x 1 for LE assist.  To gym.  Session focused on stair training with husband as she has no rails on her steps.  She was able to complete x 2 with improved quality and good assist from husband.  Encouraged to have friend/neightor stand by for +2 assist if able but it should not be necessary as she did not need any physical assist.  Discussed discharge plan and expectations.  Questions answered.  Will check pt this PM if not discharged but a BID session is not necessary unless wanted by pt or MD.   Follow Up Recommendations  Home health PT;Supervision for mobility/OOB     Equipment Recommendations  Rolling walker with 5" wheels;3in1 (PT)    Recommendations for Other Services OT consult     Precautions / Restrictions Precautions Precautions: Fall Precaution Comments: Wound vac Restrictions Weight Bearing Restrictions: Yes LLE Weight Bearing: Weight bearing as tolerated    Mobility  Bed Mobility Overal bed mobility: Needs Assistance Bed Mobility: Supine to Sit     Supine to sit: Min assist     General bed mobility comments: LLE assist    Transfers Overall transfer level: Needs assistance Equipment used: Rolling walker (2 wheeled) Transfers: Sit to/from Stand Sit to Stand: Min guard;Min assist         General transfer comment: cues for hand placements  Ambulation/Gait Ambulation/Gait assistance: Min guard Gait Distance (Feet): 100 Feet Assistive device: Rolling walker (2  wheeled) Gait Pattern/deviations: Step-to pattern Gait velocity: decreased   General Gait Details: antalgic; decreased stance time L LE; initial vc's for increasing UE support through RW to offweight L LE; initial vc's for walker use and overall technique   Stairs Stairs: Yes Stairs assistance: Min guard;+2 safety/equipment Stair Management: Backwards;Forwards;With walker Number of Stairs: 8 General stair comments: stairs x 2.  improved with second attempt.  no phsyical assist, husband holding walker   Wheelchair Mobility    Modified Rankin (Stroke Patients Only)       Balance Overall balance assessment: Needs assistance Sitting-balance support: No upper extremity supported;Feet supported Sitting balance-Leahy Scale: Normal Sitting balance - Comments: steady sitting reaching outside BOS   Standing balance support: No upper extremity supported Standing balance-Leahy Scale: Good Standing balance comment: steady static standing reaching within BOS                            Cognition Arousal/Alertness: Awake/alert Behavior During Therapy: WFL for tasks assessed/performed Overall Cognitive Status: Within Functional Limits for tasks assessed                                 General Comments: A&O x4      Exercises Total Joint Exercises Ankle Circles/Pumps: AROM;Strengthening;Both;10 reps;Supine Quad Sets: AROM;Strengthening;Both;10 reps;Supine Heel Slides: AAROM;Strengthening;Left;10 reps;Supine Straight Leg Raises: AAROM;Strengthening;Left;10 reps Long Arc Quad: AROM;Strengthening;Left;10 reps;Seated Knee Flexion: AROM;Strengthening;Left;10 reps;Seated Goniometric ROM: 0-80 -  limited by increased pain today    General Comments        Pertinent Vitals/Pain Pain Assessment: Faces Faces Pain Scale: Hurts a little bit Pain Location: L knee Pain Descriptors / Indicators: Sore;Tender;Operative site guarding Pain Intervention(s): Limited  activity within patient's tolerance;Monitored during session;Premedicated before session;Repositioned;Ice applied    Home Living                      Prior Function            PT Goals (current goals can now be found in the care plan section) Progress towards PT goals: Progressing toward goals    Frequency    BID      PT Plan Current plan remains appropriate    Co-evaluation              AM-PAC PT "6 Clicks" Mobility   Outcome Measure  Help needed turning from your back to your side while in a flat bed without using bedrails?: A Little Help needed moving from lying on your back to sitting on the side of a flat bed without using bedrails?: A Little Help needed moving to and from a bed to a chair (including a wheelchair)?: A Little Help needed standing up from a chair using your arms (e.g., wheelchair or bedside chair)?: A Little Help needed to walk in hospital room?: A Little Help needed climbing 3-5 steps with a railing? : A Little 6 Click Score: 18    End of Session Equipment Utilized During Treatment: Gait belt Activity Tolerance: Patient limited by pain Patient left: in bed;in CPM;with call bell/phone within reach;with bed alarm set   PT Visit Diagnosis: Other abnormalities of gait and mobility (R26.89);Muscle weakness (generalized) (M62.81);Difficulty in walking, not elsewhere classified (R26.2);Pain Pain - Right/Left: Left Pain - part of body: Knee     Time: 6415-8309 PT Time Calculation (min) (ACUTE ONLY): 42 min  Charges:  $Gait Training: 23-37 mins $Therapeutic Exercise: 8-22 mins                    Chesley Noon, PTA 02/23/21, 10:38 AM

## 2021-02-24 DIAGNOSIS — Z7901 Long term (current) use of anticoagulants: Secondary | ICD-10-CM | POA: Diagnosis not present

## 2021-02-24 DIAGNOSIS — Z85528 Personal history of other malignant neoplasm of kidney: Secondary | ICD-10-CM | POA: Diagnosis not present

## 2021-02-24 DIAGNOSIS — I10 Essential (primary) hypertension: Secondary | ICD-10-CM | POA: Diagnosis not present

## 2021-02-24 DIAGNOSIS — Z471 Aftercare following joint replacement surgery: Secondary | ICD-10-CM | POA: Diagnosis not present

## 2021-02-24 DIAGNOSIS — Z96652 Presence of left artificial knee joint: Secondary | ICD-10-CM | POA: Diagnosis not present

## 2021-02-24 DIAGNOSIS — K635 Polyp of colon: Secondary | ICD-10-CM | POA: Diagnosis not present

## 2021-02-24 DIAGNOSIS — K219 Gastro-esophageal reflux disease without esophagitis: Secondary | ICD-10-CM | POA: Diagnosis not present

## 2021-02-24 DIAGNOSIS — F5101 Primary insomnia: Secondary | ICD-10-CM | POA: Diagnosis not present

## 2021-02-24 DIAGNOSIS — F32A Depression, unspecified: Secondary | ICD-10-CM | POA: Diagnosis not present

## 2021-02-24 DIAGNOSIS — G5600 Carpal tunnel syndrome, unspecified upper limb: Secondary | ICD-10-CM | POA: Diagnosis not present

## 2021-02-24 DIAGNOSIS — M5030 Other cervical disc degeneration, unspecified cervical region: Secondary | ICD-10-CM | POA: Diagnosis not present

## 2021-02-24 DIAGNOSIS — E782 Mixed hyperlipidemia: Secondary | ICD-10-CM | POA: Diagnosis not present

## 2021-02-25 DIAGNOSIS — K635 Polyp of colon: Secondary | ICD-10-CM | POA: Diagnosis not present

## 2021-02-25 DIAGNOSIS — E782 Mixed hyperlipidemia: Secondary | ICD-10-CM | POA: Diagnosis not present

## 2021-02-25 DIAGNOSIS — M5030 Other cervical disc degeneration, unspecified cervical region: Secondary | ICD-10-CM | POA: Diagnosis not present

## 2021-02-25 DIAGNOSIS — Z85528 Personal history of other malignant neoplasm of kidney: Secondary | ICD-10-CM | POA: Diagnosis not present

## 2021-02-25 DIAGNOSIS — G5600 Carpal tunnel syndrome, unspecified upper limb: Secondary | ICD-10-CM | POA: Diagnosis not present

## 2021-02-25 DIAGNOSIS — Z471 Aftercare following joint replacement surgery: Secondary | ICD-10-CM | POA: Diagnosis not present

## 2021-02-25 DIAGNOSIS — K219 Gastro-esophageal reflux disease without esophagitis: Secondary | ICD-10-CM | POA: Diagnosis not present

## 2021-02-25 DIAGNOSIS — Z7901 Long term (current) use of anticoagulants: Secondary | ICD-10-CM | POA: Diagnosis not present

## 2021-02-25 DIAGNOSIS — F5101 Primary insomnia: Secondary | ICD-10-CM | POA: Diagnosis not present

## 2021-02-25 DIAGNOSIS — Z96652 Presence of left artificial knee joint: Secondary | ICD-10-CM | POA: Diagnosis not present

## 2021-02-25 DIAGNOSIS — I10 Essential (primary) hypertension: Secondary | ICD-10-CM | POA: Diagnosis not present

## 2021-02-25 DIAGNOSIS — F32A Depression, unspecified: Secondary | ICD-10-CM | POA: Diagnosis not present

## 2021-02-28 DIAGNOSIS — F5101 Primary insomnia: Secondary | ICD-10-CM | POA: Diagnosis not present

## 2021-02-28 DIAGNOSIS — K219 Gastro-esophageal reflux disease without esophagitis: Secondary | ICD-10-CM | POA: Diagnosis not present

## 2021-02-28 DIAGNOSIS — Z85528 Personal history of other malignant neoplasm of kidney: Secondary | ICD-10-CM | POA: Diagnosis not present

## 2021-02-28 DIAGNOSIS — F32A Depression, unspecified: Secondary | ICD-10-CM | POA: Diagnosis not present

## 2021-02-28 DIAGNOSIS — I10 Essential (primary) hypertension: Secondary | ICD-10-CM | POA: Diagnosis not present

## 2021-02-28 DIAGNOSIS — M5030 Other cervical disc degeneration, unspecified cervical region: Secondary | ICD-10-CM | POA: Diagnosis not present

## 2021-02-28 DIAGNOSIS — G5600 Carpal tunnel syndrome, unspecified upper limb: Secondary | ICD-10-CM | POA: Diagnosis not present

## 2021-02-28 DIAGNOSIS — Z96652 Presence of left artificial knee joint: Secondary | ICD-10-CM | POA: Diagnosis not present

## 2021-02-28 DIAGNOSIS — K635 Polyp of colon: Secondary | ICD-10-CM | POA: Diagnosis not present

## 2021-02-28 DIAGNOSIS — Z7901 Long term (current) use of anticoagulants: Secondary | ICD-10-CM | POA: Diagnosis not present

## 2021-02-28 DIAGNOSIS — E782 Mixed hyperlipidemia: Secondary | ICD-10-CM | POA: Diagnosis not present

## 2021-02-28 DIAGNOSIS — Z471 Aftercare following joint replacement surgery: Secondary | ICD-10-CM | POA: Diagnosis not present

## 2021-03-02 DIAGNOSIS — Z96652 Presence of left artificial knee joint: Secondary | ICD-10-CM | POA: Diagnosis not present

## 2021-03-02 DIAGNOSIS — I10 Essential (primary) hypertension: Secondary | ICD-10-CM | POA: Diagnosis not present

## 2021-03-02 DIAGNOSIS — E782 Mixed hyperlipidemia: Secondary | ICD-10-CM | POA: Diagnosis not present

## 2021-03-02 DIAGNOSIS — Z471 Aftercare following joint replacement surgery: Secondary | ICD-10-CM | POA: Diagnosis not present

## 2021-03-02 DIAGNOSIS — K219 Gastro-esophageal reflux disease without esophagitis: Secondary | ICD-10-CM | POA: Diagnosis not present

## 2021-03-02 DIAGNOSIS — F5101 Primary insomnia: Secondary | ICD-10-CM | POA: Diagnosis not present

## 2021-03-02 DIAGNOSIS — Z85528 Personal history of other malignant neoplasm of kidney: Secondary | ICD-10-CM | POA: Diagnosis not present

## 2021-03-02 DIAGNOSIS — M5030 Other cervical disc degeneration, unspecified cervical region: Secondary | ICD-10-CM | POA: Diagnosis not present

## 2021-03-02 DIAGNOSIS — K635 Polyp of colon: Secondary | ICD-10-CM | POA: Diagnosis not present

## 2021-03-02 DIAGNOSIS — F32A Depression, unspecified: Secondary | ICD-10-CM | POA: Diagnosis not present

## 2021-03-02 DIAGNOSIS — G5600 Carpal tunnel syndrome, unspecified upper limb: Secondary | ICD-10-CM | POA: Diagnosis not present

## 2021-03-02 DIAGNOSIS — Z7901 Long term (current) use of anticoagulants: Secondary | ICD-10-CM | POA: Diagnosis not present

## 2021-03-03 DIAGNOSIS — G5600 Carpal tunnel syndrome, unspecified upper limb: Secondary | ICD-10-CM | POA: Diagnosis not present

## 2021-03-03 DIAGNOSIS — M5030 Other cervical disc degeneration, unspecified cervical region: Secondary | ICD-10-CM | POA: Diagnosis not present

## 2021-03-03 DIAGNOSIS — Z471 Aftercare following joint replacement surgery: Secondary | ICD-10-CM | POA: Diagnosis not present

## 2021-03-03 DIAGNOSIS — K219 Gastro-esophageal reflux disease without esophagitis: Secondary | ICD-10-CM | POA: Diagnosis not present

## 2021-03-03 DIAGNOSIS — K635 Polyp of colon: Secondary | ICD-10-CM | POA: Diagnosis not present

## 2021-03-03 DIAGNOSIS — Z85528 Personal history of other malignant neoplasm of kidney: Secondary | ICD-10-CM | POA: Diagnosis not present

## 2021-03-03 DIAGNOSIS — Z7901 Long term (current) use of anticoagulants: Secondary | ICD-10-CM | POA: Diagnosis not present

## 2021-03-03 DIAGNOSIS — I10 Essential (primary) hypertension: Secondary | ICD-10-CM | POA: Diagnosis not present

## 2021-03-03 DIAGNOSIS — F5101 Primary insomnia: Secondary | ICD-10-CM | POA: Diagnosis not present

## 2021-03-03 DIAGNOSIS — E782 Mixed hyperlipidemia: Secondary | ICD-10-CM | POA: Diagnosis not present

## 2021-03-03 DIAGNOSIS — Z96652 Presence of left artificial knee joint: Secondary | ICD-10-CM | POA: Diagnosis not present

## 2021-03-03 DIAGNOSIS — F32A Depression, unspecified: Secondary | ICD-10-CM | POA: Diagnosis not present

## 2021-03-06 DIAGNOSIS — M5030 Other cervical disc degeneration, unspecified cervical region: Secondary | ICD-10-CM | POA: Diagnosis not present

## 2021-03-06 DIAGNOSIS — E782 Mixed hyperlipidemia: Secondary | ICD-10-CM | POA: Diagnosis not present

## 2021-03-06 DIAGNOSIS — I10 Essential (primary) hypertension: Secondary | ICD-10-CM | POA: Diagnosis not present

## 2021-03-06 DIAGNOSIS — G5600 Carpal tunnel syndrome, unspecified upper limb: Secondary | ICD-10-CM | POA: Diagnosis not present

## 2021-03-06 DIAGNOSIS — K635 Polyp of colon: Secondary | ICD-10-CM | POA: Diagnosis not present

## 2021-03-06 DIAGNOSIS — F32A Depression, unspecified: Secondary | ICD-10-CM | POA: Diagnosis not present

## 2021-03-06 DIAGNOSIS — K219 Gastro-esophageal reflux disease without esophagitis: Secondary | ICD-10-CM | POA: Diagnosis not present

## 2021-03-06 DIAGNOSIS — Z471 Aftercare following joint replacement surgery: Secondary | ICD-10-CM | POA: Diagnosis not present

## 2021-03-06 DIAGNOSIS — Z7901 Long term (current) use of anticoagulants: Secondary | ICD-10-CM | POA: Diagnosis not present

## 2021-03-06 DIAGNOSIS — Z85528 Personal history of other malignant neoplasm of kidney: Secondary | ICD-10-CM | POA: Diagnosis not present

## 2021-03-06 DIAGNOSIS — F5101 Primary insomnia: Secondary | ICD-10-CM | POA: Diagnosis not present

## 2021-03-06 DIAGNOSIS — Z96652 Presence of left artificial knee joint: Secondary | ICD-10-CM | POA: Diagnosis not present

## 2021-03-07 DIAGNOSIS — F5101 Primary insomnia: Secondary | ICD-10-CM | POA: Diagnosis not present

## 2021-03-07 DIAGNOSIS — Z471 Aftercare following joint replacement surgery: Secondary | ICD-10-CM | POA: Diagnosis not present

## 2021-03-07 DIAGNOSIS — E782 Mixed hyperlipidemia: Secondary | ICD-10-CM | POA: Diagnosis not present

## 2021-03-07 DIAGNOSIS — K219 Gastro-esophageal reflux disease without esophagitis: Secondary | ICD-10-CM | POA: Diagnosis not present

## 2021-03-07 DIAGNOSIS — Z96652 Presence of left artificial knee joint: Secondary | ICD-10-CM | POA: Diagnosis not present

## 2021-03-07 DIAGNOSIS — G5600 Carpal tunnel syndrome, unspecified upper limb: Secondary | ICD-10-CM | POA: Diagnosis not present

## 2021-03-07 DIAGNOSIS — M5030 Other cervical disc degeneration, unspecified cervical region: Secondary | ICD-10-CM | POA: Diagnosis not present

## 2021-03-07 DIAGNOSIS — Z85528 Personal history of other malignant neoplasm of kidney: Secondary | ICD-10-CM | POA: Diagnosis not present

## 2021-03-07 DIAGNOSIS — I10 Essential (primary) hypertension: Secondary | ICD-10-CM | POA: Diagnosis not present

## 2021-03-07 DIAGNOSIS — K635 Polyp of colon: Secondary | ICD-10-CM | POA: Diagnosis not present

## 2021-03-07 DIAGNOSIS — F32A Depression, unspecified: Secondary | ICD-10-CM | POA: Diagnosis not present

## 2021-03-07 DIAGNOSIS — Z7901 Long term (current) use of anticoagulants: Secondary | ICD-10-CM | POA: Diagnosis not present

## 2021-03-08 DIAGNOSIS — M25562 Pain in left knee: Secondary | ICD-10-CM | POA: Diagnosis not present

## 2021-03-08 DIAGNOSIS — M25462 Effusion, left knee: Secondary | ICD-10-CM | POA: Diagnosis not present

## 2021-03-10 DIAGNOSIS — M25462 Effusion, left knee: Secondary | ICD-10-CM | POA: Diagnosis not present

## 2021-03-10 DIAGNOSIS — M25562 Pain in left knee: Secondary | ICD-10-CM | POA: Diagnosis not present

## 2021-03-13 DIAGNOSIS — M25662 Stiffness of left knee, not elsewhere classified: Secondary | ICD-10-CM | POA: Diagnosis not present

## 2021-03-15 DIAGNOSIS — M25662 Stiffness of left knee, not elsewhere classified: Secondary | ICD-10-CM | POA: Diagnosis not present

## 2021-03-17 DIAGNOSIS — M25662 Stiffness of left knee, not elsewhere classified: Secondary | ICD-10-CM | POA: Diagnosis not present

## 2021-03-20 DIAGNOSIS — M25662 Stiffness of left knee, not elsewhere classified: Secondary | ICD-10-CM | POA: Diagnosis not present

## 2021-03-22 DIAGNOSIS — M25662 Stiffness of left knee, not elsewhere classified: Secondary | ICD-10-CM | POA: Diagnosis not present

## 2021-03-24 DIAGNOSIS — M25662 Stiffness of left knee, not elsewhere classified: Secondary | ICD-10-CM | POA: Diagnosis not present

## 2021-03-27 DIAGNOSIS — M25662 Stiffness of left knee, not elsewhere classified: Secondary | ICD-10-CM | POA: Diagnosis not present

## 2021-03-29 DIAGNOSIS — M25662 Stiffness of left knee, not elsewhere classified: Secondary | ICD-10-CM | POA: Diagnosis not present

## 2021-03-31 DIAGNOSIS — M25662 Stiffness of left knee, not elsewhere classified: Secondary | ICD-10-CM | POA: Diagnosis not present

## 2021-04-03 DIAGNOSIS — M25662 Stiffness of left knee, not elsewhere classified: Secondary | ICD-10-CM | POA: Diagnosis not present

## 2021-04-05 DIAGNOSIS — M25662 Stiffness of left knee, not elsewhere classified: Secondary | ICD-10-CM | POA: Diagnosis not present

## 2021-04-05 DIAGNOSIS — Z96652 Presence of left artificial knee joint: Secondary | ICD-10-CM | POA: Diagnosis not present

## 2021-04-11 DIAGNOSIS — M25662 Stiffness of left knee, not elsewhere classified: Secondary | ICD-10-CM | POA: Diagnosis not present

## 2021-04-19 ENCOUNTER — Other Ambulatory Visit: Payer: Self-pay

## 2021-04-19 ENCOUNTER — Ambulatory Visit (INDEPENDENT_AMBULATORY_CARE_PROVIDER_SITE_OTHER): Payer: Medicare Other

## 2021-04-19 VITALS — BP 104/72 | HR 76 | Temp 98.1°F | Resp 16 | Ht 67.0 in | Wt 200.4 lb

## 2021-04-19 DIAGNOSIS — M25662 Stiffness of left knee, not elsewhere classified: Secondary | ICD-10-CM | POA: Diagnosis not present

## 2021-04-19 DIAGNOSIS — Z0000 Encounter for general adult medical examination without abnormal findings: Secondary | ICD-10-CM

## 2021-04-19 NOTE — Patient Instructions (Signed)
Tracy Chase , Thank you for taking time to come for your Medicare Wellness Visit. I appreciate your ongoing commitment to your health goals. Please review the following plan we discussed and let me know if I can assist you in the future.   Screening recommendations/referrals: Colonoscopy: done 08/28/19. Repeat in 2025 Mammogram: done 10/31/20 Bone Density: done 10/07/19 Recommended yearly ophthalmology/optometry visit for glaucoma screening and checkup Recommended yearly dental visit for hygiene and checkup  Vaccinations: Influenza vaccine: done 08/26/20 Pneumococcal vaccine: done 10/12/20 Tdap vaccine: done 04/29/12 Shingles vaccine: Shingrix discussed. Please contact your pharmacy for coverage information.  Covid-19: done 02/08/20, 03/01/20 & 09/30/20  Advanced directives: Advance directive discussed with you today. I have provided a copy for you to complete at home and have notarized. Once this is complete please bring a copy in to our office so we can scan it into your chart.  Conditions/risks identified: Recommend increasing physical activity as tolerated.   Next appointment: Follow up in one year for your annual wellness visit    Preventive Care 65 Years and Older, Female Preventive care refers to lifestyle choices and visits with your health care provider that can promote health and wellness. What does preventive care include?  A yearly physical exam. This is also called an annual well check.  Dental exams once or twice a year.  Routine eye exams. Ask your health care provider how often you should have your eyes checked.  Personal lifestyle choices, including:  Daily care of your teeth and gums.  Regular physical activity.  Eating a healthy diet.  Avoiding tobacco and drug use.  Limiting alcohol use.  Practicing safe sex.  Taking low-dose aspirin every day.  Taking vitamin and mineral supplements as recommended by your health care provider. What happens during an  annual well check? The services and screenings done by your health care provider during your annual well check will depend on your age, overall health, lifestyle risk factors, and family history of disease. Counseling  Your health care provider may ask you questions about your:  Alcohol use.  Tobacco use.  Drug use.  Emotional well-being.  Home and relationship well-being.  Sexual activity.  Eating habits.  History of falls.  Memory and ability to understand (cognition).  Work and work Statistician.  Reproductive health. Screening  You may have the following tests or measurements:  Height, weight, and BMI.  Blood pressure.  Lipid and cholesterol levels. These may be checked every 5 years, or more frequently if you are over 19 years old.  Skin check.  Lung cancer screening. You may have this screening every year starting at age 40 if you have a 30-pack-year history of smoking and currently smoke or have quit within the past 15 years.  Fecal occult blood test (FOBT) of the stool. You may have this test every year starting at age 2.  Flexible sigmoidoscopy or colonoscopy. You may have a sigmoidoscopy every 5 years or a colonoscopy every 10 years starting at age 77.  Hepatitis C blood test.  Hepatitis B blood test.  Sexually transmitted disease (STD) testing.  Diabetes screening. This is done by checking your blood sugar (glucose) after you have not eaten for a while (fasting). You may have this done every 1-3 years.  Bone density scan. This is done to screen for osteoporosis. You may have this done starting at age 44.  Mammogram. This may be done every 1-2 years. Talk to your health care provider about how often you should have  regular mammograms. Talk with your health care provider about your test results, treatment options, and if necessary, the need for more tests. Vaccines  Your health care provider may recommend certain vaccines, such as:  Influenza  vaccine. This is recommended every year.  Tetanus, diphtheria, and acellular pertussis (Tdap, Td) vaccine. You may need a Td booster every 10 years.  Zoster vaccine. You may need this after age 7.  Pneumococcal 13-valent conjugate (PCV13) vaccine. One dose is recommended after age 28.  Pneumococcal polysaccharide (PPSV23) vaccine. One dose is recommended after age 70. Talk to your health care provider about which screenings and vaccines you need and how often you need them. This information is not intended to replace advice given to you by your health care provider. Make sure you discuss any questions you have with your health care provider. Document Released: 11/25/2015 Document Revised: 07/18/2016 Document Reviewed: 08/30/2015 Elsevier Interactive Patient Education  2017 [employer] Prevention in the Home Falls can cause injuries. They can happen to people of all ages. There are many things you can do to make your home safe and to help prevent falls. What can I do on the outside of my home?  Regularly fix the edges of walkways and driveways and fix any cracks.  Remove anything that might make you trip as you walk through a door, such as a raised step or threshold.  Trim any bushes or trees on the path to your home.  Use bright outdoor lighting.  Clear any walking paths of anything that might make someone trip, such as rocks or tools.  Regularly check to see if handrails are loose or broken. Make sure that both sides of any steps have handrails.  Any raised decks and porches should have guardrails on the edges.  Have any leaves, snow, or ice cleared regularly.  Use sand or salt on walking paths during winter.  Clean up any spills in your garage right away. This includes oil or grease spills. What can I do in the bathroom?  Use night lights.  Install grab bars by the toilet and in the tub and shower. Do not use towel bars as grab bars.  Use non-skid mats or decals  in the tub or shower.  If you need to sit down in the shower, use a plastic, non-slip stool.  Keep the floor dry. Clean up any water that spills on the floor as soon as it happens.  Remove soap buildup in the tub or shower regularly.  Attach bath mats securely with double-sided non-slip rug tape.  Do not have throw rugs and other things on the floor that can make you trip. What can I do in the bedroom?  Use night lights.  Make sure that you have a light by your bed that is easy to reach.  Do not use any sheets or blankets that are too big for your bed. They should not hang down onto the floor.  Have a firm chair that has side arms. You can use this for support while you get dressed.  Do not have throw rugs and other things on the floor that can make you trip. What can I do in the kitchen?  Clean up any spills right away.  Avoid walking on wet floors.  Keep items that you use a lot in easy-to-reach places.  If you need to reach something above you, use a strong step stool that has a grab bar.  Keep electrical cords out of the  way.  Do not use floor polish or wax that makes floors slippery. If you must use wax, use non-skid floor wax.  Do not have throw rugs and other things on the floor that can make you trip. What can I do with my stairs?  Do not leave any items on the stairs.  Make sure that there are handrails on both sides of the stairs and use them. Fix handrails that are broken or loose. Make sure that handrails are as long as the stairways.  Check any carpeting to make sure that it is firmly attached to the stairs. Fix any carpet that is loose or worn.  Avoid having throw rugs at the top or bottom of the stairs. If you do have throw rugs, attach them to the floor with carpet tape.  Make sure that you have a light switch at the top of the stairs and the bottom of the stairs. If you do not have them, ask someone to add them for you. What else can I do to help  prevent falls?  Wear shoes that:  Do not have high heels.  Have rubber bottoms.  Are comfortable and fit you well.  Are closed at the toe. Do not wear sandals.  If you use a stepladder:  Make sure that it is fully opened. Do not climb a closed stepladder.  Make sure that both sides of the stepladder are locked into place.  Ask someone to hold it for you, if possible.  Clearly mark and make sure that you can see:  Any grab bars or handrails.  First and last steps.  Where the edge of each step is.  Use tools that help you move around (mobility aids) if they are needed. These include:  Canes.  Walkers.  Scooters.  Crutches.  Turn on the lights when you go into a dark area. Replace any light bulbs as soon as they burn out.  Set up your furniture so you have a clear path. Avoid moving your furniture around.  If any of your floors are uneven, fix them.  If there are any pets around you, be aware of where they are.  Review your medicines with your doctor. Some medicines can make you feel dizzy. This can increase your chance of falling. Ask your doctor what other things that you can do to help prevent falls. This information is not intended to replace advice given to you by your health care provider. Make sure you discuss any questions you have with your health care provider. Document Released: 08/25/2009 Document Revised: 04/05/2016 Document Reviewed: 12/03/2014 Elsevier Interactive Patient Education  2017 [employer].

## 2021-04-19 NOTE — Progress Notes (Signed)
Subjective:   Tracy Chase is a 65 y.o. female who presents for Medicare Annual (Subsequent) preventive examination.  Review of Systems     Cardiac Risk Factors include: advanced age (>82men, >81 women);hypertension;obesity (BMI >30kg/m2)     Objective:    Today's Vitals   04/19/21 0910 04/19/21 0922  BP: 104/72   Pulse: 76   Resp: 16   Temp: 98.1 F (36.7 C)   TempSrc: Oral   SpO2: 98%   Weight: 200 lb 6.4 oz (90.9 kg)   Height: 5\' 7"  (1.702 m)   PainSc:  5    Body mass index is 31.39 kg/m.  Advanced Directives 04/19/2021 02/21/2021 02/13/2021 09/06/2020 06/23/2020 04/18/2020 08/28/2019  Does Patient Have a Medical Advance Directive? No No No No No No No  Does patient want to make changes to medical advance directive? - No - Patient declined - - - - -  Would patient like information on creating a medical advance directive? Yes (MAU/Ambulatory/Procedural Areas - Information given) - - Yes (MAU/Ambulatory/Procedural Areas - Information given) No - Patient declined No - Patient declined -    Current Medications (verified) Outpatient Encounter Medications as of 04/19/2021  Medication Sig  . acetaminophen (TYLENOL) 500 MG tablet Take 1-2 tablets (500-1,000 mg total) by mouth every 6 (six) hours as needed for mild pain (pain score 1-3 or temp > 100.5).  Marland Kitchen docusate sodium (COLACE) 100 MG capsule Take 1 capsule (100 mg total) by mouth 2 (two) times daily.  Marland Kitchen FLUoxetine (PROZAC) 20 MG capsule TAKE 1 CAPSULE BY MOUTH  DAILY (Patient taking differently: Take 20 mg by mouth in the morning.)  . gabapentin (NEURONTIN) 300 MG capsule Take 1 capsule by mouth 3 (three) times daily.  . methocarbamol (ROBAXIN) 500 MG tablet Take 1 tablet (500 mg total) by mouth every 6 (six) hours as needed for muscle spasms.  Marland Kitchen oxyCODONE (OXY IR/ROXICODONE) 5 MG immediate release tablet Take 1-2 tablets (5-10 mg total) by mouth every 4 (four) hours as needed for moderate pain (pain score 4-6).  Marland Kitchen  triamterene-hydrochlorothiazide (MAXZIDE-25) 37.5-25 MG tablet TAKE 1 TABLET BY MOUTH  DAILY (Patient taking differently: Take 1 tablet by mouth in the morning.)  . zolpidem (AMBIEN) 10 MG tablet Take 1 tablet (10 mg total) by mouth at bedtime.  . [DISCONTINUED] amoxicillin (AMOXIL) 500 MG tablet Take 2,000 mg by mouth See admin instructions. Take 4 capsules (2000 mg) by mouth 1 hour prior to dental procedures  . [DISCONTINUED] enoxaparin (LOVENOX) 40 MG/0.4ML injection Inject 0.4 mLs (40 mg total) into the skin daily for 14 days.  . [DISCONTINUED] lactobacillus acidophilus (BACID) TABS tablet Take 1 tablet by mouth daily.  . [DISCONTINUED] traMADol (ULTRAM) 50 MG tablet Take 1 tablet (50 mg total) by mouth every 6 (six) hours as needed.  . [DISCONTINUED] UNABLE TO FIND Take 1 capsule by mouth in the morning. SeroVital Anti-Aging Renewal Complex   No facility-administered encounter medications on file as of 04/19/2021.    Allergies (verified) Hydrocodone   History: Past Medical History:  Diagnosis Date  . Arthritis   . Cancer (HCC)    RENAL CELL  . Depression   . GERD (gastroesophageal reflux disease)    OCC NO MEDS  . Insomnia    Past Surgical History:  Procedure Laterality Date  . ABDOMINAL HYSTERECTOMY    . APPENDECTOMY    . BACK SURGERY     "rods in back"  . COLONOSCOPY WITH PROPOFOL N/A 08/28/2019   Procedure: COLONOSCOPY  WITH PROPOFOL;  Surgeon: Virgel Manifold, MD;  Location: [facility];  Service: Endoscopy;  Laterality: N/A;  . cryoablation for renal cell carcinoma  2012  . ETHMOIDECTOMY Left 09/06/2020   Procedure: TOTAL ETHMOIDECTOMY;  Surgeon: Clyde Canterbury, MD;  Location: [place];  Service: ENT;  Laterality: Left;  . FRONTAL SINUS EXPLORATION Left 09/06/2020   Procedure: FRONTAL SINUS EXPLORATION;  Surgeon: Clyde Canterbury, MD;  Location: [place];  Service: ENT;  Laterality: Left;  . IMAGE GUIDED SINUS SURGERY N/A 09/06/2020   Procedure:  IMAGE GUIDED SINUS SURGERY;  Surgeon: Clyde Canterbury, MD;  Location: [place];  Service: ENT;  Laterality: N/A;  need stryker disk PLACE DISK ON OR CHARGE NURSE DESK 10-01 KP  . JOINT REPLACEMENT    . LUMBAR FUSION    . MAXILLARY ANTROSTOMY Left 09/06/2020   Procedure: MAXILLARY ANTROSTOMY WITH TISSUE;  Surgeon: Clyde Canterbury, MD;  Location: [place];  Service: ENT;  Laterality: Left;  Marland Kitchen MEDIAL PARTIAL KNEE REPLACEMENT Left   . PARTIAL HYSTERECTOMY    . SPHENOIDECTOMY Left 09/06/2020   Procedure: SPHENOIDECTOMY;  Surgeon: Clyde Canterbury, MD;  Location: [place];  Service: ENT;  Laterality: Left;  . TONSILLECTOMY    . TOTAL KNEE REVISION Left 02/21/2021   Procedure: Revision, left partial to total knee;  Surgeon: Hessie Knows, MD;  Location: [facility];  Service: Orthopedics;  Laterality: Left;  CHRIS GAINES TO ASSIST  . VENTRAL HERNIA REPAIR  2012   Dr. Genevive Bi   Family History  Problem Relation Age of Onset  . Kidney failure Mother   . Congestive Heart Failure Father   . Breast cancer Neg Hx    Social History   Socioeconomic History  . Marital status: Significant Other    Spouse name: Not on file  . Number of children: 1  . Years of education: Not on file  . Highest education level: Not on file  Occupational History  . Not on file  Tobacco Use  . Smoking status: Former Smoker    Quit date: 02/13/2021    Years since quitting: 0.1  . Smokeless tobacco: Never Used  . Tobacco comment: was "social Smoker" over 15 yrs ago  Vaping Use  . Vaping Use: Never used  Substance and Sexual Activity  . Alcohol use: Yes    Alcohol/week: 0.0 standard drinks    Comment: socially 1-2X/mo  . Drug use: No  . Sexual activity: Not on file  Other Topics Concern  . Not on file  Social History Narrative   Pt has 1 son and 2 step daughters. Lives with significant other   Social Determinants of Health   Financial Resource Strain: Low Risk   . Difficulty of Paying  Living Expenses: Not hard at all  Food Insecurity: No Food Insecurity  . Worried About Charity fundraiser in the Last Year: Never true  . Ran Out of Food in the Last Year: Never true  Transportation Needs: No Transportation Needs  . Lack of Transportation (Medical): No  . Lack of Transportation (Non-Medical): No  Physical Activity: Inactive  . Days of Exercise per Week: 0 days  . Minutes of Exercise per Session: 0 min  Stress: Stress Concern Present  . Feeling of Stress : To some extent  Social Connections: Moderately Isolated  . Frequency of Communication with Friends and Family: More than three times a week  . Frequency of Social Gatherings with Friends and Family: Once a week  .  Attends Religious Services: Never  . Active Member of Clubs or Organizations: No  . Attends Archivist Meetings: Never  . Marital Status: Living with partner    Tobacco Counseling Counseling given: Not Answered Comment: was "social Smoker" over 15 yrs ago   Clinical Intake:  Pre-visit preparation completed: Yes  Pain : 0-10 Pain Score: 5  Pain Type: Chronic pain Pain Location: Knee Pain Orientation: Left Pain Descriptors / Indicators: Aching,Discomfort,Sore Pain Onset: More than a month ago Pain Frequency: Constant     BMI - recorded: 31.39 Nutritional Status: BMI > 30  Obese Nutritional Risks: None Diabetes: No  How often do you need to have someone help you when you read instructions, pamphlets, or other written materials from your doctor or pharmacy?: 1 - Never    Interpreter Needed?: No  Information entered by :: Clemetine Marker LPN   Activities of Daily Living In your present state of health, do you have any difficulty performing the following activities: 04/19/2021 02/21/2021  Hearing? N -  Comment declines hearing aids -  Vision? N -  Difficulty concentrating or making decisions? N -  Walking or climbing stairs? Y -  Dressing or bathing? N -  Doing errands,  shopping? N N  Preparing Food and eating ? N -  Using the Toilet? N -  In the past six months, have you accidently leaked urine? N -  Do you have problems with loss of bowel control? N -  Managing your Medications? N -  Managing your Finances? N -  Housekeeping or managing your Housekeeping? N -  Some recent data might be hidden    Patient Care Team: Glean Hess, MD as PCP - General (Internal Medicine) Kate Sable, MD as PCP - Cardiology (Cardiology) Hessie Knows, MD as Consulting Physician (Orthopedic Surgery) Sharlet Salina, MD as Referring Physician (Physical Medicine and Rehabilitation) Rico Junker, RN as Registered Nurse Theodore Demark, RN as Registered Nurse Jannet Mantis, MD (Dermatology)  Indicate any recent Medical Services you may have received from other than [facility] providers in the past year (date may be approximate).     Assessment:   This is a routine wellness examination for [place].  Hearing/Vision screen  Hearing Screening   125Hz  250Hz  500Hz  1000Hz  2000Hz  3000Hz  4000Hz  6000Hz  8000Hz   Right ear:           Left ear:           Comments: Pt denies hearing difficulty  Vision Screening Comments: [facility] screenings at [facility]; had lasik, wears reading glasses; due for exam  Dietary issues and exercise activities discussed: Current Exercise Habits: The patient does not participate in regular exercise at present, Exercise limited by: orthopedic condition(s)  Goals Addressed   None    Depression Screen PHQ 2/9 Scores 04/19/2021 12/21/2020 10/12/2020 06/15/2020 05/24/2020 04/18/2020 03/10/2020  PHQ - 2 Score 0 2 0 0 0 3 0  PHQ- 9 Score - 6 0 0 0 12 0    Fall Risk Fall Risk  04/19/2021 12/21/2020 10/12/2020 06/15/2020 05/24/2020  Falls in the past year? 0 0 1 0 0  Number falls in past yr: 0 0 0 - -  Injury with Fall? 0 0 0 - -  Risk for fall due to : Impaired balance/gait - - No Fall Risks No Fall Risks  Follow up Falls prevention  discussed Falls evaluation completed Falls evaluation completed Falls evaluation completed Falls evaluation completed    [place]  PERTAINING TO THE HOME:  Any stairs in or around the home? Yes  If so, are there any without handrails? Yes  - outside steps Home free of loose throw rugs in walkways, pet beds, electrical cords, etc? Yes  Adequate lighting in your home to reduce risk of falls? Yes   ASSISTIVE DEVICES UTILIZED TO PREVENT FALLS:  Life alert? No  Use of a cane, walker or w/c? Yes  Grab bars in the bathroom? No  Shower chair or bench in shower? Yes  Elevated toilet seat or a handicapped toilet? No   TIMED UP AND GO:  Was the test performed? Yes .  Length of time to ambulate 10 feet: 7 sec.   Gait slow and steady with assistive device  Cognitive Function: Normal cognitive status assessed by direct observation by this Nurse Health Advisor. No abnormalities found.       6CIT Screen 04/18/2020  What Year? 0 points  What month? 0 points  What time? 0 points  Count back from 20 0 points  Months in reverse 0 points  Repeat phrase 0 points  Total Score 0    Immunizations Immunization History  Administered Date(s) Administered  . Fluad Quad(high Dose 65+) 09/02/2019  . Influenza Inj Mdck Quad Pf 08/18/2018  . Influenza,inj,Quad PF,6+ Mos 10/02/2016, 09/03/2017  . Influenza-Unspecified 08/26/2020  . PFIZER(Purple Top)SARS-COV-2 Vaccination 02/08/2020, 03/01/2020, 09/30/2020  . Pneumococcal Conjugate-13 09/23/2019  . Pneumococcal Polysaccharide-23 10/12/2020  . Tdap 04/29/2012    TDAP status: Up to date  Flu Vaccine status: Up to date  Pneumococcal vaccine status: Up to date  Covid-19 vaccine status: Completed vaccines  Qualifies for Shingles Vaccine? Yes   Zostavax completed No   Shingrix Completed?: No.    Education has been provided regarding the importance of this vaccine. Patient has been advised to call insurance company to determine out of  pocket expense if they have not yet received this vaccine. Advised may also receive vaccine at local pharmacy or Health Dept. Verbalized acceptance and understanding.  Screening Tests Health Maintenance  Topic Date Due  . Zoster Vaccines- Shingrix (1 of 2) Never done  . Hepatitis C Screening  05/24/2021 (Originally 03/15/1974)  . INFLUENZA VACCINE  06/12/2021  . MAMMOGRAM  10/31/2021  . TETANUS/TDAP  04/29/2022  . COLONOSCOPY (Pts 45-8yrs Insurance coverage will need to be confirmed)  08/27/2024  . DEXA SCAN  Completed  . COVID-19 Vaccine  Completed  . PNA vac Low Risk Adult  Completed  . Pneumococcal Vaccine 54-25 Years old  Aged Out  . HPV VACCINES  Aged Out    Health Maintenance  Health Maintenance Due  Topic Date Due  . Zoster Vaccines- Shingrix (1 of 2) Never done    Colorectal cancer screening: Type of screening: Colonoscopy. Completed 08/18/19. Repeat every 5 years  Mammogram status: Completed 10/31/20. Repeat every year  Bone Density status: Completed 10/07/19. Results reflect: Bone density results: NORMAL. Repeat every 2 years.  Lung Cancer Screening: (Low Dose CT Chest recommended if Age 80-80 years, 30 pack-year currently smoking OR have quit w/in 15years.) does not qualify.   Additional Screening:  Hepatitis C Screening: does qualify; postponed  Vision Screening: Recommended annual ophthalmology exams for early detection of glaucoma and other disorders of the eye. Is the patient up to date with their annual eye exam?  No  Who is the provider or what is the name of the office in which the patient attends annual eye exams? [facility]  Dental Screening: Recommended  annual dental exams for proper oral hygiene  Community Resource Referral / Chronic Care Management: CRR required this visit?  No   CCM required this visit?  No      Plan:     I have personally reviewed and noted the following in the patient's chart:   . Medical and social history . Use of  alcohol, tobacco or illicit drugs  . Current medications and supplements including opioid prescriptions.  . Functional ability and status . Nutritional status . Physical activity . Advanced directives . List of other physicians . Hospitalizations, surgeries, and ER visits in previous 12 months . Vitals . Screenings to include cognitive, depression, and falls . Referrals and appointments  In addition, I have reviewed and discussed with patient certain preventive protocols, quality metrics, and best practice recommendations. A written personalized care plan for preventive services as well as general preventive health recommendations were provided to patient.     Clemetine Marker, LPN   05/16/517   Nurse Notes: pt doing well; still in physical therapy s/p total left knee replacement

## 2021-04-21 DIAGNOSIS — M25662 Stiffness of left knee, not elsewhere classified: Secondary | ICD-10-CM | POA: Diagnosis not present

## 2021-04-24 DIAGNOSIS — M25662 Stiffness of left knee, not elsewhere classified: Secondary | ICD-10-CM | POA: Diagnosis not present

## 2021-04-26 DIAGNOSIS — M25662 Stiffness of left knee, not elsewhere classified: Secondary | ICD-10-CM | POA: Diagnosis not present

## 2021-04-28 DIAGNOSIS — M25662 Stiffness of left knee, not elsewhere classified: Secondary | ICD-10-CM | POA: Diagnosis not present

## 2021-05-01 DIAGNOSIS — M25662 Stiffness of left knee, not elsewhere classified: Secondary | ICD-10-CM | POA: Diagnosis not present

## 2021-05-05 DIAGNOSIS — M25662 Stiffness of left knee, not elsewhere classified: Secondary | ICD-10-CM | POA: Diagnosis not present

## 2021-05-08 DIAGNOSIS — M25662 Stiffness of left knee, not elsewhere classified: Secondary | ICD-10-CM | POA: Diagnosis not present

## 2021-05-11 DIAGNOSIS — M25662 Stiffness of left knee, not elsewhere classified: Secondary | ICD-10-CM | POA: Diagnosis not present

## 2021-05-19 DIAGNOSIS — M25662 Stiffness of left knee, not elsewhere classified: Secondary | ICD-10-CM | POA: Diagnosis not present

## 2021-05-25 DIAGNOSIS — C44729 Squamous cell carcinoma of skin of left lower limb, including hip: Secondary | ICD-10-CM | POA: Diagnosis not present

## 2021-05-25 DIAGNOSIS — Z859 Personal history of malignant neoplasm, unspecified: Secondary | ICD-10-CM | POA: Diagnosis not present

## 2021-05-25 DIAGNOSIS — Q6689 Other  specified congenital deformities of feet: Secondary | ICD-10-CM | POA: Diagnosis not present

## 2021-05-25 DIAGNOSIS — C44629 Squamous cell carcinoma of skin of left upper limb, including shoulder: Secondary | ICD-10-CM | POA: Diagnosis not present

## 2021-05-25 DIAGNOSIS — D485 Neoplasm of uncertain behavior of skin: Secondary | ICD-10-CM | POA: Diagnosis not present

## 2021-05-25 DIAGNOSIS — L578 Other skin changes due to chronic exposure to nonionizing radiation: Secondary | ICD-10-CM | POA: Diagnosis not present

## 2021-05-25 DIAGNOSIS — M778 Other enthesopathies, not elsewhere classified: Secondary | ICD-10-CM | POA: Diagnosis not present

## 2021-05-25 DIAGNOSIS — L570 Actinic keratosis: Secondary | ICD-10-CM | POA: Diagnosis not present

## 2021-05-25 DIAGNOSIS — C44622 Squamous cell carcinoma of skin of right upper limb, including shoulder: Secondary | ICD-10-CM | POA: Diagnosis not present

## 2021-05-25 DIAGNOSIS — L821 Other seborrheic keratosis: Secondary | ICD-10-CM | POA: Diagnosis not present

## 2021-05-31 ENCOUNTER — Other Ambulatory Visit: Payer: Self-pay | Admitting: Internal Medicine

## 2021-05-31 DIAGNOSIS — F5101 Primary insomnia: Secondary | ICD-10-CM

## 2021-05-31 NOTE — Telephone Encounter (Signed)
Requested medication (s) are due for refill today: no  Requested medication (s) are on the active medication list: yes   Last refill:  01/01/2021  Future visit scheduled: Yes  Notes to clinic:  this refill cannot be delegated    Requested Prescriptions  Pending Prescriptions Disp Refills   zolpidem (AMBIEN) 10 MG tablet [Pharmacy Med Name: Zolpidem Tartrate 10 MG Oral Tablet] 90 tablet     Sig: TAKE 1 TABLET BY MOUTH AT  BEDTIME      Not Delegated - Psychiatry:  Anxiolytics/Hypnotics Failed - 05/31/2021  1:10 PM      Failed - This refill cannot be delegated      Failed - Urine Drug Screen completed in last 360 days      Passed - Valid encounter within last 6 months    Recent Outpatient Visits           5 months ago Acute cystitis without hematuria   [facility] Glean Hess, MD   7 months ago Annual physical exam   [facility] Glean Hess, MD   11 months ago Acute non-recurrent maxillary sinusitis   [facility] Juline Patch, MD   1 year ago Epigastric pain   [facility] Glean Hess, MD   1 year ago Essential (primary) hypertension   [facility] Glean Hess, MD       Future Appointments             In 5 months Army Melia Jesse Sans, MD [facility], [facility]

## 2021-06-12 DIAGNOSIS — Z96652 Presence of left artificial knee joint: Secondary | ICD-10-CM | POA: Diagnosis not present

## 2021-06-12 DIAGNOSIS — M25552 Pain in left hip: Secondary | ICD-10-CM | POA: Diagnosis not present

## 2021-06-14 DIAGNOSIS — D0462 Carcinoma in situ of skin of left upper limb, including shoulder: Secondary | ICD-10-CM | POA: Diagnosis not present

## 2021-06-14 DIAGNOSIS — C44629 Squamous cell carcinoma of skin of left upper limb, including shoulder: Secondary | ICD-10-CM | POA: Diagnosis not present

## 2021-06-20 ENCOUNTER — Other Ambulatory Visit: Payer: Self-pay | Admitting: Internal Medicine

## 2021-06-20 DIAGNOSIS — F5101 Primary insomnia: Secondary | ICD-10-CM

## 2021-06-20 NOTE — Telephone Encounter (Signed)
Requested medications are due for refill today.  yes  Requested medications are on the active medications list.  yes  Last refill. 7/21/202  Future visit scheduled.   yes  Notes to clinic.  Medication not delegated.

## 2021-06-28 DIAGNOSIS — C44729 Squamous cell carcinoma of skin of left lower limb, including hip: Secondary | ICD-10-CM | POA: Diagnosis not present

## 2021-07-13 DIAGNOSIS — C44622 Squamous cell carcinoma of skin of right upper limb, including shoulder: Secondary | ICD-10-CM | POA: Diagnosis not present

## 2021-07-25 ENCOUNTER — Other Ambulatory Visit: Payer: Self-pay | Admitting: Internal Medicine

## 2021-07-25 DIAGNOSIS — F5101 Primary insomnia: Secondary | ICD-10-CM

## 2021-07-25 NOTE — Telephone Encounter (Signed)
Please review. Last office visit 12/21/2020.  KP

## 2021-07-25 NOTE — Telephone Encounter (Signed)
Requested medication (s) are due for refill today - yes  Requested medication (s) are on the active medication list -yes  Future visit scheduled -yes  Last refill: 06/01/21  Notes to clinic: Request RF: non delegated Rx  Requested Prescriptions  Pending Prescriptions Disp Refills   zolpidem (AMBIEN) 10 MG tablet [Pharmacy Med Name: Zolpidem Tartrate 10 MG Oral Tablet] 30 tablet     Sig: TAKE 1 TABLET BY MOUTH AT  BEDTIME     Not Delegated - Psychiatry:  Anxiolytics/Hypnotics Failed - 07/25/2021 10:26 AM      Failed - This refill cannot be delegated      Failed - Urine Drug Screen completed in last 360 days      Failed - Valid encounter within last 6 months    Recent Outpatient Visits           7 months ago Acute cystitis without hematuria   [facility] Glean Hess, MD   9 months ago Annual physical exam   [facility] Glean Hess, MD   1 year ago Acute non-recurrent maxillary sinusitis   [facility] Juline Patch, MD   1 year ago Epigastric pain   [facility] Glean Hess, MD   1 year ago Essential (primary) hypertension   [place], Laura H, MD       Future Appointments             In 3 months Army Melia Jesse Sans, MD [facility], [facility]               Requested Prescriptions  Pending Prescriptions Disp Refills   zolpidem (AMBIEN) 10 MG tablet [Pharmacy Med Name: Zolpidem Tartrate 10 MG Oral Tablet] 30 tablet     Sig: TAKE 1 TABLET BY MOUTH AT  BEDTIME     Not Delegated - Psychiatry:  Anxiolytics/Hypnotics Failed - 07/25/2021 10:26 AM      Failed - This refill cannot be delegated      Failed - Urine Drug Screen completed in last 360 days      Failed - Valid encounter within last 6 months    Recent Outpatient Visits           7 months ago Acute cystitis without hematuria   [facility] Glean Hess, MD   9 months ago Annual physical exam   [facility] Glean Hess, MD   1 year ago Acute non-recurrent maxillary sinusitis   [facility] Juline Patch, MD   1 year ago Epigastric pain   [facility] Glean Hess, MD   1 year ago Essential (primary) hypertension   [facility] Glean Hess, MD       Future Appointments             In 3 months Army Melia Jesse Sans, MD [facility], [facility]

## 2021-08-01 DIAGNOSIS — R252 Cramp and spasm: Secondary | ICD-10-CM | POA: Diagnosis not present

## 2021-08-01 DIAGNOSIS — Z87891 Personal history of nicotine dependence: Secondary | ICD-10-CM | POA: Diagnosis not present

## 2021-08-07 ENCOUNTER — Ambulatory Visit: Payer: Self-pay

## 2021-08-07 NOTE — Telephone Encounter (Signed)
Pt. Reports she has been "really tired and fatigued for about a month." No other symptoms except occasional pain to her left arm. Pt. Has an appointment for Friday.    Answer Assessment - Initial Assessment Questions 1. DECRIPTION: "Describe how you are feeling."     Fatigue 2. SEVERITY: "How bad is it?"  "Can you stand and walk?"   - MILD - Feels weak or tired, but does not interfere with work, school or normal activities   - Geraldine to stand and walk; weakness interferes with work, school, or normal activities   - SEVERE - Unable to stand or walk     Moderate 3. ONSET:  "When did the weakness begin?"     1 month ago 4. CAUSE: "What do you think is causing the weakness?"     Unsure 5. MEDICINES: "Have you recently started a new medicine or had a change in the amount of a medicine?"     No 6. OTHER SYMPTOMS: "Do you have any other symptoms?" (e.g., chest pain, fever, cough, SOB, vomiting, diarrhea, bleeding, other areas of pain)     No 7. PREGNANCY: "Is there any chance you are pregnant?" "When was your last menstrual period?"     No  Protocols used: Weakness (Generalized) and Fatigue-A-AH

## 2021-08-11 ENCOUNTER — Ambulatory Visit (INDEPENDENT_AMBULATORY_CARE_PROVIDER_SITE_OTHER): Payer: Medicare Other | Admitting: Internal Medicine

## 2021-08-11 ENCOUNTER — Other Ambulatory Visit: Payer: Self-pay

## 2021-08-11 ENCOUNTER — Encounter: Payer: Self-pay | Admitting: Internal Medicine

## 2021-08-11 VITALS — BP 100/62 | HR 70 | Ht 67.0 in | Wt 186.8 lb

## 2021-08-11 DIAGNOSIS — R0602 Shortness of breath: Secondary | ICD-10-CM | POA: Diagnosis not present

## 2021-08-11 DIAGNOSIS — M79602 Pain in left arm: Secondary | ICD-10-CM

## 2021-08-11 DIAGNOSIS — M62838 Other muscle spasm: Secondary | ICD-10-CM | POA: Diagnosis not present

## 2021-08-11 DIAGNOSIS — I10 Essential (primary) hypertension: Secondary | ICD-10-CM

## 2021-08-11 DIAGNOSIS — F419 Anxiety disorder, unspecified: Secondary | ICD-10-CM

## 2021-08-11 DIAGNOSIS — F411 Generalized anxiety disorder: Secondary | ICD-10-CM | POA: Diagnosis not present

## 2021-08-11 DIAGNOSIS — G5602 Carpal tunnel syndrome, left upper limb: Secondary | ICD-10-CM

## 2021-08-11 MED ORDER — FLUOXETINE HCL 40 MG PO CAPS: 40.0000 mg | ORAL_CAPSULE | Freq: Every day | ORAL | 0 refills | Status: DC

## 2021-08-11 NOTE — Progress Notes (Signed)
Date:  08/11/2021   Name:  Tracy Chase   DOB:  12-20-1955   MRN:  [id number]   Chief Complaint: Arm Pain  Arm Pain  The incident occurred more than 1 week ago. There was no injury mechanism. The pain is present in the left shoulder. The quality of the pain is described as aching. The pain radiates to the chest. The pain is at a severity of 8/10. The pain is moderate. The pain has been Intermittent since the incident. Associated symptoms include numbness and tingling. Pertinent negatives include no chest pain. The symptoms are aggravated by movement.  Extremity Weakness  Associated symptoms include numbness and tingling. Pertinent negatives include no fever. Sometimes feels short of breath. Always fatigued. Feels like "something is not right for over a month now." Feels washed out, tired every day for the past month. Still working out.  BP has been low so does not maxzide every day. She also reports an episode about a [place] ago at the beach. She became very upset that her 21 y/o grand son did not check in for several hours.  Since that time she not felt normal. Intermittent nocturnal tingling in her left hand.  She can wring her hand and the feeling comes back.  No weakness or over use.  Lab Results  Component Value Date   CREATININE 0.83 02/22/2021   BUN 7 (L) 02/22/2021   NA 139 02/22/2021   K 4.1 02/22/2021   CL 101 02/22/2021   CO2 30 02/22/2021   Lab Results  Component Value Date   CHOL 232 (H) 10/12/2020   HDL 96 10/12/2020   LDLCALC 125 (H) 10/12/2020   TRIG 67 10/12/2020   CHOLHDL 2.4 10/12/2020   Lab Results  Component Value Date   TSH 1.650 10/12/2020   No results found for: HGBA1C Lab Results  Component Value Date   WBC 8.6 02/22/2021   HGB 10.5 (L) 02/22/2021   HCT 31.1 (L) 02/22/2021   MCV 90.7 02/22/2021   PLT 184 02/22/2021   Lab Results  Component Value Date   ALT 15 02/13/2021   AST 18 02/13/2021   ALKPHOS 55 02/13/2021   BILITOT 0.8 02/13/2021      Review of Systems  Constitutional:  Positive for fatigue. Negative for chills, diaphoresis and fever.  Respiratory:  Negative for chest tightness, shortness of breath and wheezing.   Cardiovascular:  Negative for chest pain, palpitations and leg swelling.  Musculoskeletal:  Positive for arthralgias (recovering from knee surgery) and extremity weakness.  Neurological:  Positive for tingling, light-headedness and numbness. Negative for dizziness and headaches.  Psychiatric/Behavioral:  Positive for decreased concentration. Negative for sleep disturbance. The patient is nervous/anxious.    Patient Active Problem List   Diagnosis Date Noted   S/P TKR (total knee replacement) using cement, left 02/21/2021   Mixed hyperlipidemia 09/25/2019   Gastroesophageal reflux disease 09/23/2019   Encounter for screening colonoscopy    Polyp of colon    Bone spur of foot 03/19/2018   Degenerative disc disease, cervical 11/18/2017   Generalized anxiety disorder 06/21/2015   History of renal cell cancer 06/21/2015   Carpal tunnel syndrome 06/21/2015   Essential (primary) hypertension 06/21/2015   Idiopathic insomnia 06/21/2015    Allergies  Allergen Reactions   Hydrocodone Itching    Past Surgical History:  Procedure Laterality Date   ABDOMINAL HYSTERECTOMY     APPENDECTOMY     BACK SURGERY     "rods in back"  COLONOSCOPY WITH PROPOFOL N/A 08/28/2019   Procedure: COLONOSCOPY WITH PROPOFOL;  Surgeon: Virgel Manifold, MD;  Location: [facility];  Service: Endoscopy;  Laterality: N/A;   cryoablation for renal cell carcinoma  2012   ETHMOIDECTOMY Left 09/06/2020   Procedure: TOTAL ETHMOIDECTOMY;  Surgeon: Clyde Canterbury, MD;  Location: [place];  Service: ENT;  Laterality: Left;   FRONTAL SINUS EXPLORATION Left 09/06/2020   Procedure: FRONTAL SINUS EXPLORATION;  Surgeon: Clyde Canterbury, MD;  Location: [place];  Service: ENT;  Laterality: Left;   IMAGE GUIDED  SINUS SURGERY N/A 09/06/2020   Procedure: IMAGE GUIDED SINUS SURGERY;  Surgeon: Clyde Canterbury, MD;  Location: [place];  Service: ENT;  Laterality: N/A;  need stryker disk PLACE DISK ON OR CHARGE NURSE DESK 10-01 KP   JOINT REPLACEMENT     LUMBAR FUSION     MAXILLARY ANTROSTOMY Left 09/06/2020   Procedure: MAXILLARY ANTROSTOMY WITH TISSUE;  Surgeon: Clyde Canterbury, MD;  Location: [place];  Service: ENT;  Laterality: Left;   MEDIAL PARTIAL KNEE REPLACEMENT Left    PARTIAL HYSTERECTOMY     SPHENOIDECTOMY Left 09/06/2020   Procedure: SPHENOIDECTOMY;  Surgeon: Clyde Canterbury, MD;  Location: [place];  Service: ENT;  Laterality: Left;   TONSILLECTOMY     TOTAL KNEE REVISION Left 02/21/2021   Procedure: Revision, left partial to total knee;  Surgeon: Hessie Knows, MD;  Location: [facility];  Service: Orthopedics;  Laterality: Left;  CHRIS GAINES TO ASSIST   VENTRAL HERNIA REPAIR  2012   Dr. Genevive Bi    Social History   Tobacco Use   Smoking status: Former    Types: Cigarettes    Quit date: 02/13/2021    Years since quitting: 0.4   Smokeless tobacco: Never   Tobacco comments:    was "social Smoker" over 15 yrs ago  Vaping Use   Vaping Use: Never used  Substance Use Topics   Alcohol use: Yes    Alcohol/week: 0.0 standard drinks    Comment: socially 1-2X/mo   Drug use: No     Medication list has been reviewed and updated.  Current Meds  Medication Sig   acetaminophen (TYLENOL) 500 MG tablet Take 1-2 tablets (500-1,000 mg total) by mouth every 6 (six) hours as needed for mild pain (pain score 1-3 or temp > 100.5).   docusate sodium (COLACE) 100 MG capsule Take 1 capsule (100 mg total) by mouth 2 (two) times daily.   FLUoxetine (PROZAC) 20 MG capsule TAKE 1 CAPSULE BY MOUTH  DAILY (Patient taking differently: Take 20 mg by mouth in the morning.)   triamterene-hydrochlorothiazide (MAXZIDE-25) 37.5-25 MG tablet TAKE 1 TABLET BY MOUTH  DAILY (Patient taking  differently: Take 1 tablet by mouth in the morning. Does not take all the time because she said she drinks a lot of fluids.)   zolpidem (AMBIEN) 10 MG tablet TAKE 1 TABLET BY MOUTH AT  BEDTIME    PHQ 2/9 Scores 08/11/2021 04/19/2021 12/21/2020 10/12/2020  PHQ - 2 Score 5 0 2 0  PHQ- 9 Score 8 - 6 0    GAD 7 : Generalized Anxiety Score 08/11/2021 12/21/2020 10/12/2020 06/15/2020  Nervous, Anxious, on Edge 3 2 0 0  Control/stop worrying 0 2 2 0  Worry too much - different things 2 2 2  0  Trouble relaxing 0 0 0 0  Restless 0 0 0 0  Easily annoyed or irritable 2 0 1 0  Afraid - awful might happen 2  0 3 0  Total GAD 7 Score 9 6 8  0  Anxiety Difficulty Not difficult at all Not difficult at all - Not difficult at all    BP Readings from Last 3 Encounters:  08/11/21 100/62  04/19/21 104/72  02/23/21 (!) 142/95    Physical Exam Vitals and nursing note reviewed.  Constitutional:      General: She is not in acute distress.    Appearance: Normal appearance. She is well-developed.  HENT:     Head: Normocephalic and atraumatic.  Eyes:     Extraocular Movements: Extraocular movements intact.  Neck:     Vascular: No carotid bruit or JVD.  Cardiovascular:     Rate and Rhythm: Normal rate and regular rhythm.     Pulses: Normal pulses.     Heart sounds: No murmur heard. Pulmonary:     Effort: Pulmonary effort is normal. No respiratory distress.     Breath sounds: No wheezing or rhonchi.  Musculoskeletal:     Cervical back: Normal range of motion.     Right lower leg: No edema.     Left lower leg: No edema.  Lymphadenopathy:     Cervical: No cervical adenopathy.  Skin:    General: Skin is warm and dry.     Findings: No rash.  Neurological:     Mental Status: She is alert and oriented to person, place, and time.  Psychiatric:        Mood and Affect: Mood is depressed. Affect is tearful.        Speech: Speech normal.        Behavior: Behavior normal.        Judgment: Judgment normal.     Wt Readings from Last 3 Encounters:  08/11/21 186 lb 12.8 oz (84.7 kg)  04/19/21 200 lb 6.4 oz (90.9 kg)  02/13/21 197 lb 1.5 oz (89.4 kg)    BP 100/62   Pulse 70   Ht 5\' 7"  (1.702 m)   Wt 186 lb 12.8 oz (84.7 kg)   SpO2 98%   BMI 29.26 kg/m   Assessment and Plan: 1. Left arm pain No obvious cause for pain. She had an ECHO and coronary CT 11/2019 that was reported normal - EKG 12-Lead - NSR @ 61, inverted T waves  2. Shortness of breath Rule out anemia post op and thyroid disease Continue exercise as tolerated - EKG 12-Lead - CBC with Differential/Platelet - TSH + free T4  3. Essential (primary) hypertension Clinically stable exam with well controlled BP. Continue Maxzide daily. Pt to continue current regimen and low sodium diet; benefits of regular exercise as able discussed. - Basic metabolic panel  4. Generalized anxiety disorder Seems to have triggered some somatic symptoms and more anxiety and tearfulness Will increase Prozac to 40 mg per day - call for new Rx when needed  5. Carpal tunnel syndrome of left wrist Mild intermittent sx - no specific therapy is needed at this time  6. Muscle spasm Check Mg and K levels - Magnesium     Partially dictated using Editor, commissioning. Any errors are unintentional.  Halina Maidens, MD [facility]  08/11/2021

## 2021-08-12 LAB — TSH+FREE T4
Free T4: 1.26 ng/dL (ref 0.82–1.77)
TSH: 1.63 u[IU]/mL (ref 0.450–4.500)

## 2021-08-12 LAB — BASIC METABOLIC PANEL
BUN/Creatinine Ratio: 11 — ABNORMAL LOW (ref 12–28)
BUN: 8 mg/dL (ref 8–27)
CO2: 23 mmol/L (ref 20–29)
Calcium: 9.5 mg/dL (ref 8.7–10.3)
Chloride: 99 mmol/L (ref 96–106)
Creatinine, Ser: 0.7 mg/dL (ref 0.57–1.00)
Glucose: 91 mg/dL (ref 70–99)
Potassium: 4.2 mmol/L (ref 3.5–5.2)
Sodium: 140 mmol/L (ref 134–144)
eGFR: 95 mL/min/{1.73_m2} (ref >59)

## 2021-08-12 LAB — CBC WITH DIFFERENTIAL/PLATELET
Basophils Absolute: 0 10*3/uL (ref 0.0–0.2)
Basos: 1 %
EOS (ABSOLUTE): 0.1 10*3/uL (ref 0.0–0.4)
Eos: 1 %
Hematocrit: 41.4 % (ref 34.0–46.6)
Hemoglobin: 13.8 g/dL (ref 11.1–15.9)
Immature Grans (Abs): 0 10*3/uL (ref 0.0–0.1)
Immature Granulocytes: 0 %
Lymphocytes Absolute: 2.8 10*3/uL (ref 0.7–3.1)
Lymphs: 53 %
MCH: 29.3 pg (ref 26.6–33.0)
MCHC: 33.3 g/dL (ref 31.5–35.7)
MCV: 88 fL (ref 79–97)
Monocytes Absolute: 0.4 10*3/uL (ref 0.1–0.9)
Monocytes: 8 %
Neutrophils Absolute: 1.9 10*3/uL (ref 1.4–7.0)
Neutrophils: 37 %
Platelets: 224 10*3/uL (ref 150–450)
RBC: 4.71 x10E6/uL (ref 3.77–5.28)
RDW: 13.2 % (ref 11.7–15.4)
WBC: 5.2 10*3/uL (ref 3.4–10.8)

## 2021-08-12 LAB — MAGNESIUM: Magnesium: 1.8 mg/dL (ref 1.6–2.3)

## 2021-10-02 ENCOUNTER — Other Ambulatory Visit: Payer: Self-pay | Admitting: Internal Medicine

## 2021-10-02 DIAGNOSIS — Z1231 Encounter for screening mammogram for malignant neoplasm of breast: Secondary | ICD-10-CM

## 2021-10-07 DIAGNOSIS — N3000 Acute cystitis without hematuria: Secondary | ICD-10-CM | POA: Diagnosis not present

## 2021-10-16 ENCOUNTER — Encounter: Payer: Medicare Other | Admitting: Internal Medicine

## 2021-10-17 ENCOUNTER — Encounter: Payer: Self-pay | Admitting: Internal Medicine

## 2021-10-17 DIAGNOSIS — I700 Atherosclerosis of aorta: Secondary | ICD-10-CM | POA: Insufficient documentation

## 2021-10-25 ENCOUNTER — Encounter: Payer: Medicare Other | Admitting: Internal Medicine

## 2021-11-01 ENCOUNTER — Ambulatory Visit (INDEPENDENT_AMBULATORY_CARE_PROVIDER_SITE_OTHER): Payer: Medicare Other | Admitting: Internal Medicine

## 2021-11-01 ENCOUNTER — Other Ambulatory Visit: Payer: Self-pay

## 2021-11-01 ENCOUNTER — Encounter: Payer: Self-pay | Admitting: Internal Medicine

## 2021-11-01 VITALS — BP 104/72 | HR 70 | Ht 67.0 in | Wt 187.0 lb

## 2021-11-01 DIAGNOSIS — E782 Mixed hyperlipidemia: Secondary | ICD-10-CM

## 2021-11-01 DIAGNOSIS — Z0000 Encounter for general adult medical examination without abnormal findings: Secondary | ICD-10-CM

## 2021-11-01 DIAGNOSIS — M65321 Trigger finger, right index finger: Secondary | ICD-10-CM | POA: Insufficient documentation

## 2021-11-01 DIAGNOSIS — I10 Essential (primary) hypertension: Secondary | ICD-10-CM

## 2021-11-01 DIAGNOSIS — I700 Atherosclerosis of aorta: Secondary | ICD-10-CM

## 2021-11-01 DIAGNOSIS — Z1159 Encounter for screening for other viral diseases: Secondary | ICD-10-CM

## 2021-11-01 DIAGNOSIS — Z1231 Encounter for screening mammogram for malignant neoplasm of breast: Secondary | ICD-10-CM

## 2021-11-01 DIAGNOSIS — F411 Generalized anxiety disorder: Secondary | ICD-10-CM

## 2021-11-01 DIAGNOSIS — F5101 Primary insomnia: Secondary | ICD-10-CM

## 2021-11-01 LAB — POCT URINALYSIS DIPSTICK
Bilirubin, UA: NEGATIVE
Blood, UA: NEGATIVE
Glucose, UA: NEGATIVE
Ketones, UA: NEGATIVE
Leukocytes, UA: NEGATIVE
Nitrite, UA: NEGATIVE
Protein, UA: NEGATIVE
Spec Grav, UA: 1.01 (ref 1.010–1.025)
Urobilinogen, UA: 0.2 E.U./dL
pH, UA: 6 (ref 5.0–8.0)

## 2021-11-01 MED ORDER — ZOLPIDEM TARTRATE 10 MG PO TABS
10.0000 mg | ORAL_TABLET | Freq: Every day | ORAL | 1 refills | Status: DC
Start: 2021-11-01 — End: 2022-04-26

## 2021-11-01 MED ORDER — FLUOXETINE HCL 20 MG PO TABS: 20.0000 mg | ORAL_TABLET | Freq: Every day | ORAL | 3 refills | Status: DC

## 2021-11-01 MED ORDER — TRIAMTERENE-HCTZ 37.5-25 MG PO TABS: 1.0000 | ORAL_TABLET | Freq: Every day | ORAL | 3 refills | Status: DC

## 2021-11-01 NOTE — Progress Notes (Signed)
Date:  11/01/2021   Name:  Tracy Chase   DOB:  [birth year]   MRN:  [id number]   Chief Complaint: Annual Exam (Breast Exam. UA.) Tracy Chase is a 65 y.o. female who presents today for her Complete Annual Exam. She feels well. She reports exercising walking daily. She reports she is sleeping well. Breast complaints pain on left breast (had for years).  She complains of general aches and pains, mostly her lower back and knees, but sometimes her upper back as well.  Mammogram: 10/2020 scheduled DEXA: 09/2019  Normal Pap smear: discontinued Colonoscopy: 08/2019  Immunization History  Administered Date(s) Administered   Fluad Quad(high Dose 65+) 09/02/2019   Influenza Inj Mdck Quad Pf 08/18/2018   Influenza,inj,Quad PF,6+ Mos 10/02/2016, 09/03/2017   Influenza-Unspecified 08/26/2020, 08/12/2021   PFIZER(Purple Top)SARS-COV-2 Vaccination 02/08/2020, 03/01/2020, 09/30/2020   Pfizer Covid-19 Vaccine Bivalent Booster 83yr & up 05/16/2021   Pneumococcal Conjugate-13 09/23/2019   Pneumococcal Polysaccharide-23 10/12/2020   Tdap 04/29/2012    Hypertension The problem is controlled. Pertinent negatives include no chest pain, headaches, palpitations or shortness of breath. Past treatments include diuretics. There is no history of kidney disease, CAD/MI or CVA.  Depression        This is a chronic problem.The problem is unchanged.  Associated symptoms include insomnia.  Associated symptoms include no fatigue and no headaches.  Past treatments include SSRIs - Selective serotonin reuptake inhibitors. Insomnia Primary symptoms: no sleep disturbance.   The problem occurs nightly. The problem is unchanged. Past treatments include medication. The treatment provided significant relief. PMH includes: depression.    Lab Results  Component Value Date   NA 140 08/11/2021   K 4.2 08/11/2021   CO2 23 08/11/2021   GLUCOSE 91 08/11/2021   BUN 8 08/11/2021   CREATININE 0.70 08/11/2021   CALCIUM 9.5  08/11/2021   EGFR 95 08/11/2021   GFRNONAA >60 02/22/2021   Lab Results  Component Value Date   CHOL 232 (H) 10/12/2020   HDL 96 10/12/2020   LDLCALC 125 (H) 10/12/2020   TRIG 67 10/12/2020   CHOLHDL 2.4 10/12/2020   Lab Results  Component Value Date   TSH 1.630 08/11/2021   No results found for: HGBA1C Lab Results  Component Value Date   WBC 5.2 08/11/2021   HGB 13.8 08/11/2021   HCT 41.4 08/11/2021   MCV 88 08/11/2021   PLT 224 08/11/2021   Lab Results  Component Value Date   ALT 15 02/13/2021   AST 18 02/13/2021   ALKPHOS 55 02/13/2021   BILITOT 0.8 02/13/2021   No results found for: 25OHVITD2, 25OHVITD3, VD25OH   Review of Systems  Constitutional:  Negative for chills, fatigue, fever and unexpected weight change.  HENT:  Negative for congestion, hearing loss, tinnitus, trouble swallowing and voice change.   Eyes:  Negative for visual disturbance.  Respiratory:  Negative for cough, chest tightness, shortness of breath and wheezing.   Cardiovascular:  Negative for chest pain, palpitations and leg swelling.  Gastrointestinal:  Negative for abdominal pain, constipation, diarrhea and vomiting.  Endocrine: Negative for polydipsia and polyuria.  Genitourinary:  Negative for dysuria, frequency, genital sores, vaginal bleeding and vaginal discharge.  Musculoskeletal:  Negative for arthralgias, gait problem and joint swelling.  Skin:  Negative for color change and rash.  Neurological:  Negative for dizziness, tremors, light-headedness and headaches.  Hematological:  Negative for adenopathy. Does not bruise/bleed easily.  Psychiatric/Behavioral:  Positive for depression. Negative for dysphoric mood and sleep  disturbance. The patient has insomnia. The patient is not nervous/anxious.    Patient Active Problem List   Diagnosis Date Noted   Trigger index finger of right hand 11/01/2021   Atherosclerosis of aorta ([place]) 10/17/2021   S/P TKR (total knee replacement) using  cement, left 02/21/2021   Mixed hyperlipidemia 09/25/2019   Encounter for screening colonoscopy    Polyp of colon    Degenerative disc disease, cervical 11/18/2017   Generalized anxiety disorder 06/21/2015   History of renal cell cancer 06/21/2015   Carpal tunnel syndrome 06/21/2015   Essential (primary) hypertension 06/21/2015   Idiopathic insomnia 06/21/2015    Allergies  Allergen Reactions   Hydrocodone Itching    Past Surgical History:  Procedure Laterality Date   ABDOMINAL HYSTERECTOMY     APPENDECTOMY     BACK SURGERY     "rods in back"   COLONOSCOPY WITH PROPOFOL N/A 08/28/2019   Procedure: COLONOSCOPY WITH PROPOFOL;  Surgeon: Virgel Manifold, MD;  Location: [facility];  Service: Endoscopy;  Laterality: N/A;   cryoablation for renal cell carcinoma  2012   ETHMOIDECTOMY Left 09/06/2020   Procedure: TOTAL ETHMOIDECTOMY;  Surgeon: Clyde Canterbury, MD;  Location: [place];  Service: ENT;  Laterality: Left;   FRONTAL SINUS EXPLORATION Left 09/06/2020   Procedure: FRONTAL SINUS EXPLORATION;  Surgeon: Clyde Canterbury, MD;  Location: [place];  Service: ENT;  Laterality: Left;   IMAGE GUIDED SINUS SURGERY N/A 09/06/2020   Procedure: IMAGE GUIDED SINUS SURGERY;  Surgeon: Clyde Canterbury, MD;  Location: [place];  Service: ENT;  Laterality: N/A;  need stryker disk PLACE DISK ON OR CHARGE NURSE DESK 10-01 KP   JOINT REPLACEMENT     LUMBAR FUSION     MAXILLARY ANTROSTOMY Left 09/06/2020   Procedure: MAXILLARY ANTROSTOMY WITH TISSUE;  Surgeon: Clyde Canterbury, MD;  Location: [place];  Service: ENT;  Laterality: Left;   MEDIAL PARTIAL KNEE REPLACEMENT Left    PARTIAL HYSTERECTOMY     SPHENOIDECTOMY Left 09/06/2020   Procedure: SPHENOIDECTOMY;  Surgeon: Clyde Canterbury, MD;  Location: [place];  Service: ENT;  Laterality: Left;   TONSILLECTOMY     TOTAL KNEE REVISION Left 02/21/2021   Procedure: Revision, left partial to total  knee;  Surgeon: Hessie Knows, MD;  Location: [facility];  Service: Orthopedics;  Laterality: Left;  CHRIS GAINES TO ASSIST   VENTRAL HERNIA REPAIR  2012   Dr. Genevive Bi    Social History   Tobacco Use   Smoking status: Former    Types: Cigarettes    Quit date: 02/13/2021    Years since quitting: 0.7   Smokeless tobacco: Never   Tobacco comments:    was "social Smoker" over 15 yrs ago  Vaping Use   Vaping Use: Never used  Substance Use Topics   Alcohol use: Yes    Alcohol/week: 0.0 standard drinks    Comment: socially 1-2X/mo   Drug use: No     Medication list has been reviewed and updated.  Current Meds  Medication Sig   docusate sodium (COLACE) 100 MG capsule Take 1 capsule (100 mg total) by mouth 2 (two) times daily. (Patient taking differently: Take 100 mg by mouth daily.)   FLUoxetine (PROZAC) 20 MG tablet Take 1 tablet (20 mg total) by mouth daily.   gabapentin (NEURONTIN) 300 MG capsule Take 1 capsule by mouth at bedtime.   [DISCONTINUED] acetaminophen (TYLENOL) 500 MG tablet Take 1-2 tablets (500-1,000 mg total) by mouth every 6 (  six) hours as needed for mild pain (pain score 1-3 or temp > 100.5).   [DISCONTINUED] FLUoxetine (PROZAC) 40 MG capsule Take 1 capsule (40 mg total) by mouth daily. (Patient taking differently: Take 20 mg by mouth daily.)   [DISCONTINUED] triamterene-hydrochlorothiazide (MAXZIDE-25) 37.5-25 MG tablet TAKE 1 TABLET BY MOUTH  DAILY (Patient taking differently: Take 1 tablet by mouth in the morning. Does not take all the time because she said she drinks a lot of fluids.)   [DISCONTINUED] zolpidem (AMBIEN) 10 MG tablet TAKE 1 TABLET BY MOUTH AT  BEDTIME    PHQ 2/9 Scores 11/01/2021 08/11/2021 04/19/2021 12/21/2020  PHQ - 2 Score 1 5 0 2  PHQ- 9 Score 5 8 - 6    GAD 7 : Generalized Anxiety Score 11/01/2021 08/11/2021 12/21/2020 10/12/2020  Nervous, Anxious, on Edge 1 3 2  0  Control/stop worrying 3 0 2 2  Worry too much - different things 3 2 2 2   Trouble  relaxing 2 0 0 0  Restless 0 0 0 0  Easily annoyed or irritable 2 2 0 1  Afraid - awful might happen 2 2 0 3  Total GAD 7 Score 13 9 6 8   Anxiety Difficulty Not difficult at all Not difficult at all Not difficult at all -    BP Readings from Last 3 Encounters:  11/01/21 104/72  08/11/21 100/62  04/19/21 104/72    Physical Exam Vitals and nursing note reviewed.  Constitutional:      General: She is not in acute distress.    Appearance: She is well-developed.  HENT:     Head: Normocephalic and atraumatic.     Right Ear: Tympanic membrane and ear canal normal.     Left Ear: Tympanic membrane and ear canal normal.     Nose:     Right Sinus: No maxillary sinus tenderness.     Left Sinus: No maxillary sinus tenderness.  Eyes:     General: No scleral icterus.       Right eye: No discharge.        Left eye: No discharge.     Conjunctiva/sclera: Conjunctivae normal.  Neck:     Thyroid: No thyromegaly.     Vascular: No carotid bruit.  Cardiovascular:     Rate and Rhythm: Normal rate and regular rhythm.     Pulses: Normal pulses.     Heart sounds: Normal heart sounds.  Pulmonary:     Effort: Pulmonary effort is normal. No respiratory distress.     Breath sounds: No wheezing.  Chest:  Breasts:    Right: No mass, nipple discharge, skin change or tenderness.     Left: No mass, nipple discharge, skin change or tenderness.  Abdominal:     General: Bowel sounds are normal.     Palpations: Abdomen is soft.     Tenderness: There is no abdominal tenderness.  Musculoskeletal:     Right hand: Tenderness (thickening base of index finger with mild triggering) present.     Cervical back: Normal range of motion. No erythema.     Lumbar back: Decreased range of motion.     Right lower leg: No edema.     Left lower leg: No edema.  Lymphadenopathy:     Cervical: No cervical adenopathy.  Skin:    General: Skin is warm and dry.     Findings: No rash.  Neurological:     Mental Status:  She is alert and oriented to person, place, and time.  Cranial Nerves: No cranial nerve deficit.     Sensory: No sensory deficit.     Deep Tendon Reflexes: Reflexes are normal and symmetric.  Psychiatric:        Attention and Perception: Attention normal.        Mood and Affect: Mood normal.    Wt Readings from Last 3 Encounters:  11/01/21 187 lb (84.8 kg)  08/11/21 186 lb 12.8 oz (84.7 kg)  04/19/21 200 lb 6.4 oz (90.9 kg)    BP 104/72    Pulse 70    Ht 5' 7"  (1.702 m)    Wt 187 lb (84.8 kg)    SpO2 98%    BMI 29.29 kg/m   Assessment and Plan: 1. Annual physical exam Normal exam except for weight which is decreasing steadily. - Hemoglobin A1c  2. Encounter for screening mammogram for breast cancer Schedule at [facility] for tomorrow.  3. Essential (primary) hypertension Clinically stable exam with well controlled BP. Tolerating medications without side effects at this time. Pt to continue current regimen and low sodium diet; benefits of regular exercise as able discussed. - CBC with Differential/Platelet - Comprehensive metabolic panel - POCT urinalysis dipstick - triamterene-hydrochlorothiazide (MAXZIDE-25) 37.5-25 MG tablet; Take 1 tablet by mouth daily.  Dispense: 90 tablet; Refill: 3  4. Mixed hyperlipidemia Has been hesitant to take statins but would consider Zetia. She has aortic atherosclerosis on CT but coronary calcium scores were normal. - Lipid panel  5. Atherosclerosis of aorta ([place]) As above - would consider Zetia  6. Need for hepatitis C screening test - Hepatitis C antibody  7. Trigger index finger of right hand Mild sx at present. Can refer to Ortho if worsening.  8. Generalized anxiety disorder Clinically stable on current regimen with good control of symptoms, No SI or HI. Will continue current therapy. - FLUoxetine (PROZAC) 20 MG tablet; Take 1 tablet (20 mg total) by mouth daily.  Dispense: 90 tablet; Refill: 3  9. Idiopathic  insomnia Symptoms controlled. - zolpidem (AMBIEN) 10 MG tablet; Take 1 tablet (10 mg total) by mouth at bedtime.  Dispense: 90 tablet; Refill: 1   Partially dictated using Editor, commissioning. Any errors are unintentional.  Halina Maidens, MD [facility]  11/01/2021

## 2021-11-02 ENCOUNTER — Other Ambulatory Visit: Payer: Self-pay

## 2021-11-02 DIAGNOSIS — F411 Generalized anxiety disorder: Secondary | ICD-10-CM

## 2021-11-02 LAB — LIPID PANEL
Chol/HDL Ratio: 2.8 ratio (ref 0.0–4.4)
Cholesterol, Total: 235 mg/dL — ABNORMAL HIGH (ref 100–199)
HDL: 83 mg/dL (ref >39)
LDL Chol Calc (NIH): 140 mg/dL — ABNORMAL HIGH (ref 0–99)
Triglycerides: 68 mg/dL (ref 0–149)
VLDL Cholesterol Cal: 12 mg/dL (ref 5–40)

## 2021-11-02 LAB — CBC WITH DIFFERENTIAL/PLATELET
Basophils Absolute: 0 10*3/uL (ref 0.0–0.2)
Basos: 1 %
EOS (ABSOLUTE): 0.1 10*3/uL (ref 0.0–0.4)
Eos: 2 %
Hematocrit: 40.6 % (ref 34.0–46.6)
Hemoglobin: 13.4 g/dL (ref 11.1–15.9)
Immature Grans (Abs): 0 10*3/uL (ref 0.0–0.1)
Immature Granulocytes: 0 %
Lymphocytes Absolute: 2.9 10*3/uL (ref 0.7–3.1)
Lymphs: 46 %
MCH: 30.2 pg (ref 26.6–33.0)
MCHC: 33 g/dL (ref 31.5–35.7)
MCV: 91 fL (ref 79–97)
Monocytes Absolute: 0.3 10*3/uL (ref 0.1–0.9)
Monocytes: 6 %
Neutrophils Absolute: 2.8 10*3/uL (ref 1.4–7.0)
Neutrophils: 45 %
Platelets: 232 10*3/uL (ref 150–450)
RBC: 4.44 x10E6/uL (ref 3.77–5.28)
RDW: 11.5 % — ABNORMAL LOW (ref 11.7–15.4)
WBC: 6.2 10*3/uL (ref 3.4–10.8)

## 2021-11-02 LAB — COMPREHENSIVE METABOLIC PANEL
ALT: 12 IU/L (ref 0–32)
AST: 18 IU/L (ref 0–40)
Albumin/Globulin Ratio: 2.3 — ABNORMAL HIGH (ref 1.2–2.2)
Albumin: 4.5 g/dL (ref 3.8–4.8)
Alkaline Phosphatase: 77 IU/L (ref 44–121)
BUN/Creatinine Ratio: 17 (ref 12–28)
BUN: 12 mg/dL (ref 8–27)
Bilirubin Total: 0.4 mg/dL (ref 0.0–1.2)
CO2: 27 mmol/L (ref 20–29)
Calcium: 9.5 mg/dL (ref 8.7–10.3)
Chloride: 98 mmol/L (ref 96–106)
Creatinine, Ser: 0.72 mg/dL (ref 0.57–1.00)
Globulin, Total: 2 g/dL (ref 1.5–4.5)
Glucose: 97 mg/dL (ref 70–99)
Potassium: 4.1 mmol/L (ref 3.5–5.2)
Sodium: 140 mmol/L (ref 134–144)
Total Protein: 6.5 g/dL (ref 6.0–8.5)
eGFR: 92 mL/min/{1.73_m2} (ref >59)

## 2021-11-02 LAB — HEPATITIS C ANTIBODY: Hep C Virus Ab: <0.1 s/co ratio (ref 0.0–0.9)

## 2021-11-02 LAB — HEMOGLOBIN A1C
Est. average glucose Bld gHb Est-mCnc: 105 mg/dL
Hgb A1c MFr Bld: 5.3 % (ref 4.8–5.6)

## 2021-11-02 MED ORDER — FLUOXETINE HCL 20 MG PO CAPS: 20.0000 mg | ORAL_CAPSULE | Freq: Every day | ORAL | 3 refills | Status: DC

## 2021-11-02 MED ORDER — EZETIMIBE 10 MG PO TABS: 10.0000 mg | ORAL_TABLET | Freq: Every day | ORAL | 1 refills | Status: DC

## 2021-11-07 ENCOUNTER — Telehealth: Payer: Self-pay

## 2021-11-07 ENCOUNTER — Telehealth: Payer: Self-pay | Admitting: Internal Medicine

## 2021-11-07 NOTE — Telephone Encounter (Signed)
PA completed waiting for insurance approval.  KP

## 2021-11-07 NOTE — Telephone Encounter (Signed)
Spoke to pt she stated she has received prozac in the mail today 11/07/21. See outgoing call.  KP

## 2021-11-07 NOTE — Telephone Encounter (Signed)
Copied from [place] [id number]. Topic: General - Other >> Nov 07, 2021  1:07 PM Yvette Rack wrote: Reason for CRM: Pt stated she was returning call to office for the second time. Pt requests call back

## 2021-11-07 NOTE — Telephone Encounter (Signed)
Spoke to pt she stated that she received her medication in the mail today that she didn't need a refill on Prozac. Pt stated she has been getting messages about Prozac as well and not sure why. Pt stated she still has not received Ambien. Told pt Ambien was sent in 11/01/21 to call the pharmacy first to see if they received the refill. Pt verbalized understanding.  KP

## 2021-11-07 NOTE — Telephone Encounter (Signed)
Called pt left Vm to call back. Received a fax from pharmacy that pts insurance did not cover fluoxetine. Will complete PA. Pt has been taking this medication for a while does pt want to pay out of pocket or does pt want to try alternative.  [facility] nurse may give results to patient if they return call to clinic, a CRM has been created.  KP

## 2021-11-07 NOTE — Telephone Encounter (Signed)
Patient called in about fluoxetine, not being covered with insurance, message that was left in system. Patient doesn't want to pay out of pocket. Please call back.

## 2021-11-15 NOTE — Telephone Encounter (Signed)
Denied by insurance.  KP 

## 2021-11-20 ENCOUNTER — Encounter: Payer: Self-pay | Admitting: Internal Medicine

## 2021-12-08 ENCOUNTER — Ambulatory Visit
Admission: RE | Admit: 2021-12-08 | Discharge: 2021-12-08 | Disposition: A | Discharge status: Home / Self Care | Payer: Medicare Other | Source: Ambulatory Visit | Attending: Internal Medicine | Admitting: Internal Medicine

## 2021-12-08 ENCOUNTER — Other Ambulatory Visit: Payer: Self-pay

## 2021-12-08 DIAGNOSIS — Z1231 Encounter for screening mammogram for malignant neoplasm of breast: Secondary | ICD-10-CM | POA: Insufficient documentation

## 2022-04-15 IMAGING — CT CT HEAD W/O CM
3 series · 15 of 46 positions shown, 18 images · non-contrast
Comparison: Face CT with contrast today reported separately. Head
CT 04/20/2012.

CLINICAL DATA: 64-year-old female with progressive headache.
Recently diagnosed with sinus infection and treated with antibiotics
and steroids but not improved. Facial droop x1 week.

EXAM:
CT HEAD WITHOUT CONTRAST
TECHNIQUE: Contiguous axial images were obtained from the base of the skull
through the vertex without intravenous contrast.

[Series 3: head wo · axial · 0.44mm/px · z∈[-128,-8]mm · 9 of 29 slices shown, 12 images]
[im 3/29  brain]
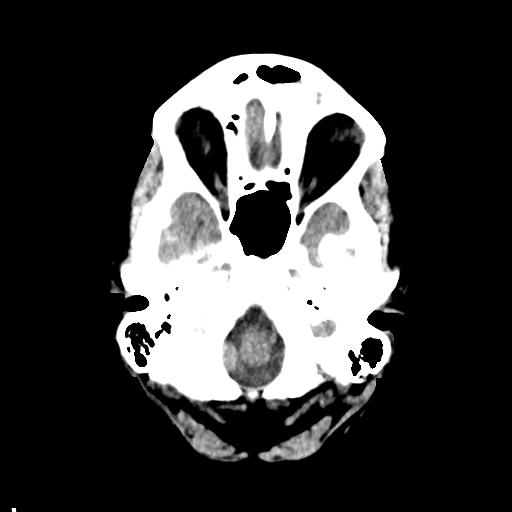
[im 3/29  bone]
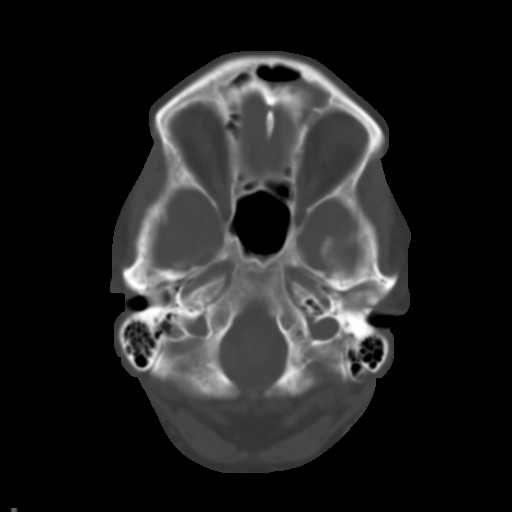
[im 6/29  brain]
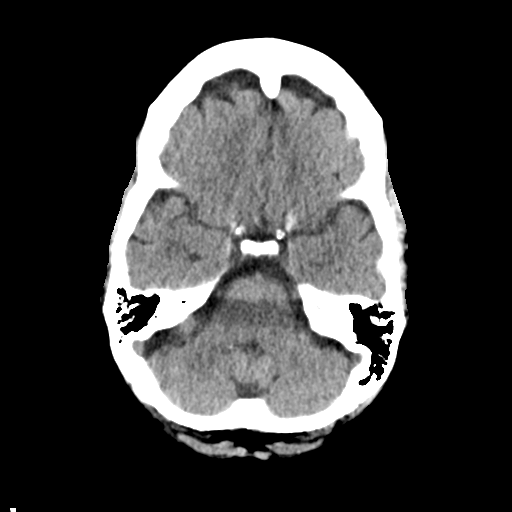
[im 9/29  brain]
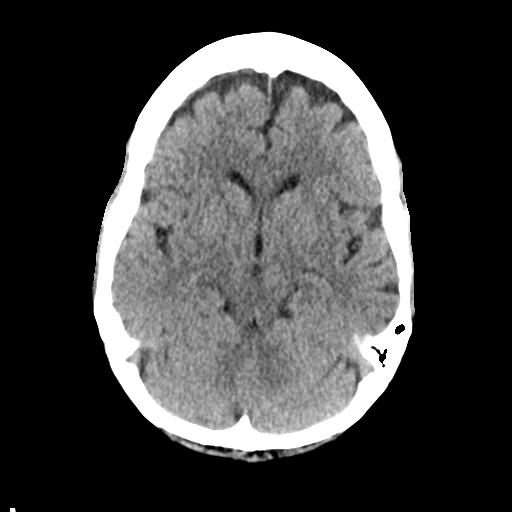
[im 12/29  brain]
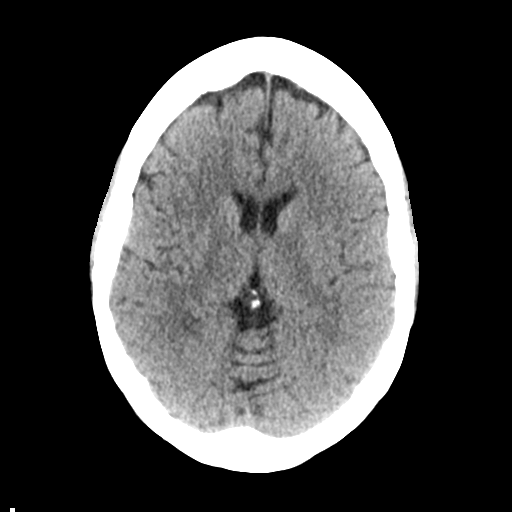
[im 15/29  brain]
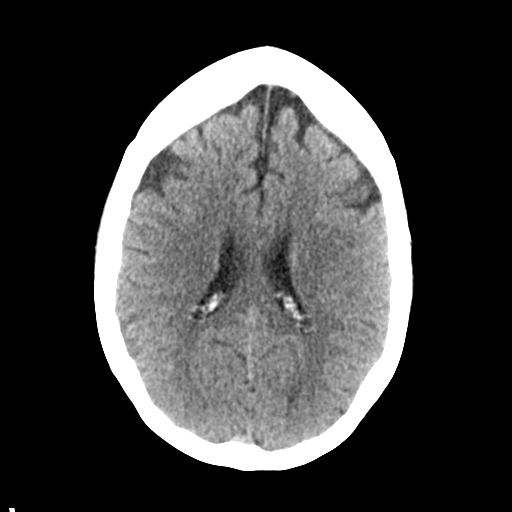
[im 15/29  bone]
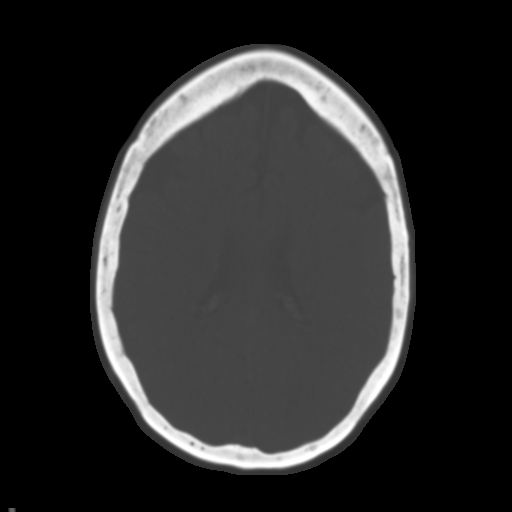
[im 18/29  brain]
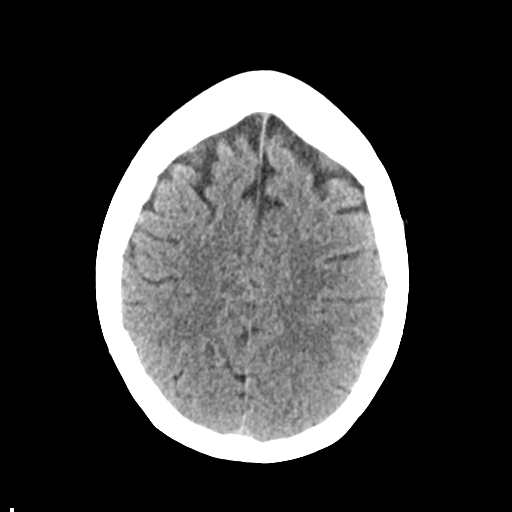
[im 21/29  brain]
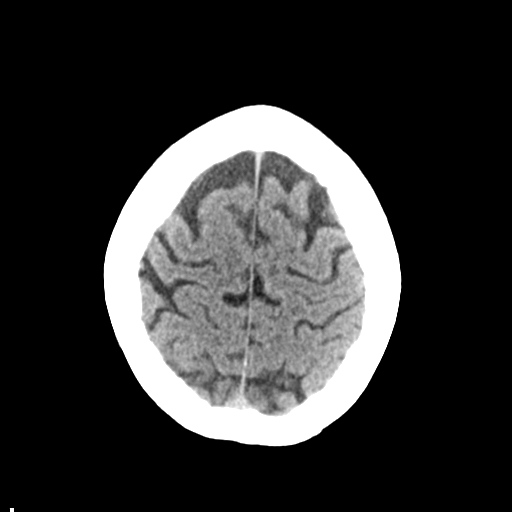
[im 24/29  brain]
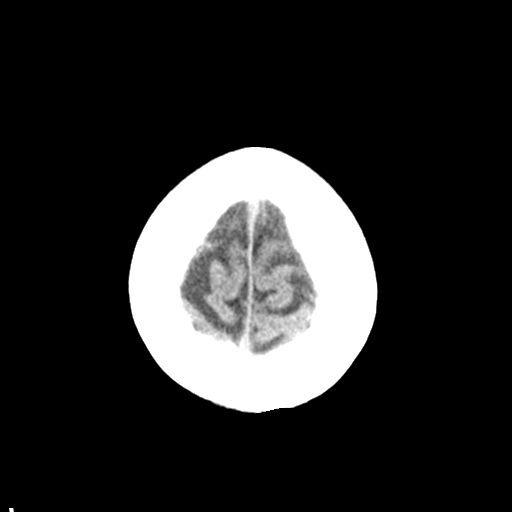
[im 27/29  brain]
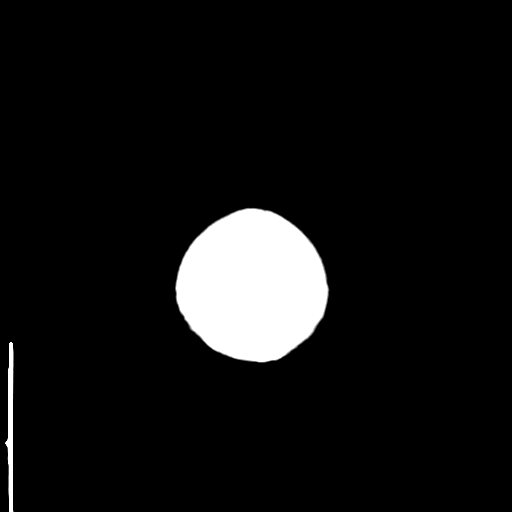
[im 27/29  bone]
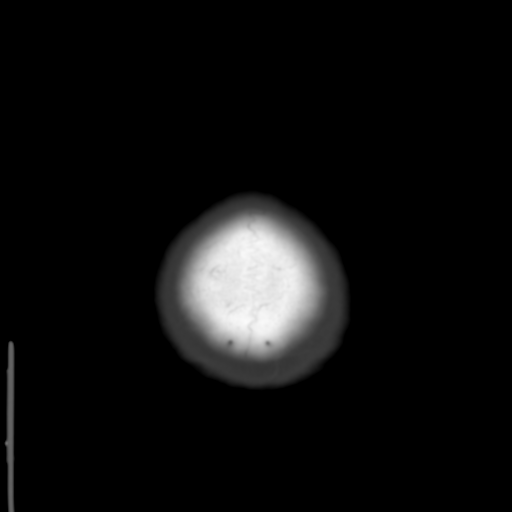

[Series 4: coronal soft tissue · coronal · 0.34mm/px · 3 of 68 slices shown]
[im 23/68  brain]
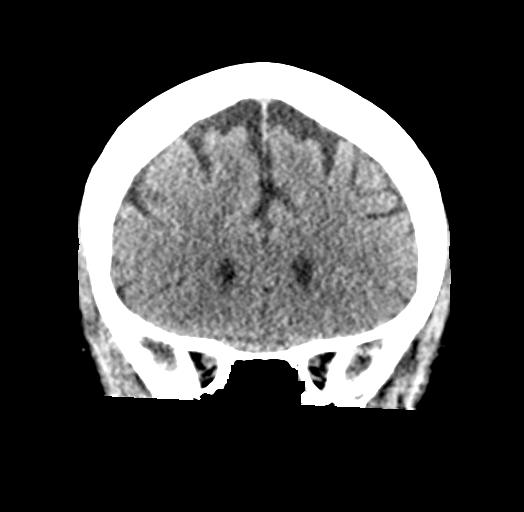
[im 30/68  brain]
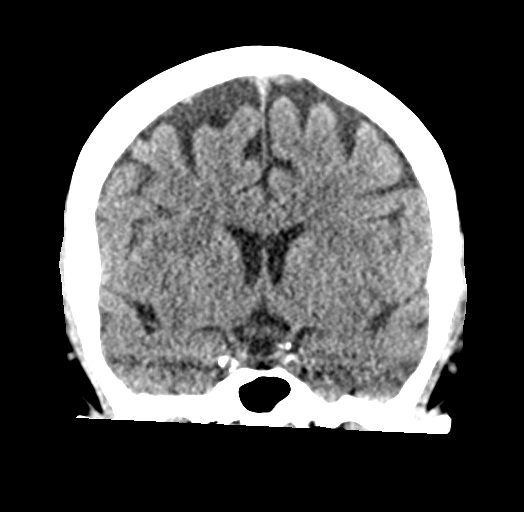
[im 38/68  brain]
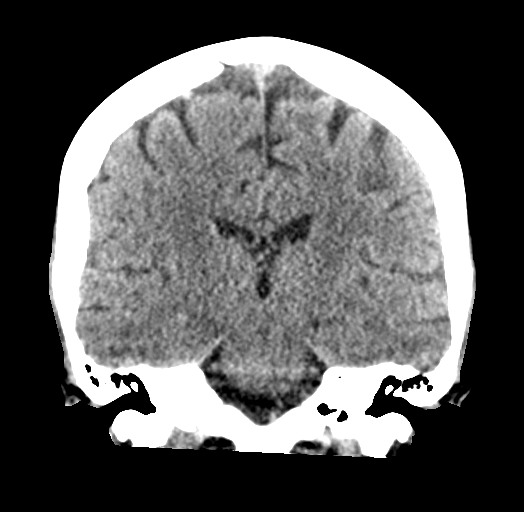

[Series 5: sagittal soft tissue · sagittal · 0.34mm/px · 3 of 52 slices shown]
[im 18/52  brain]
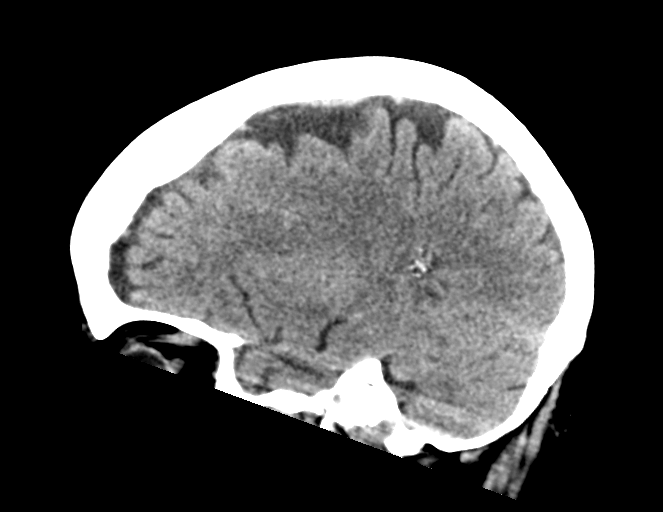
[im 26/52  brain]
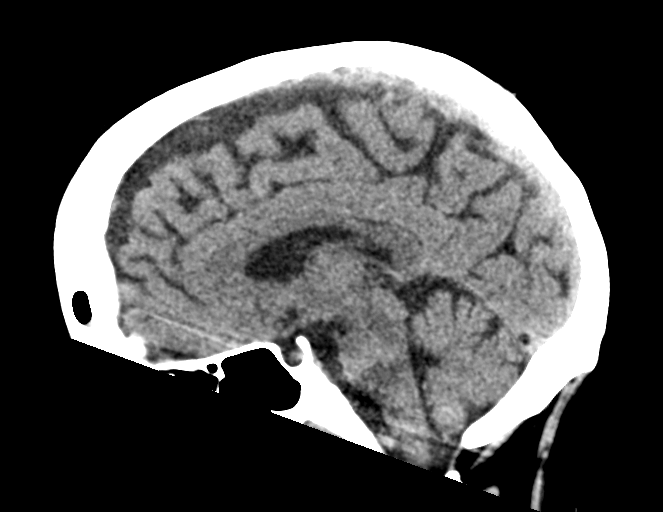
[im 35/52  brain]
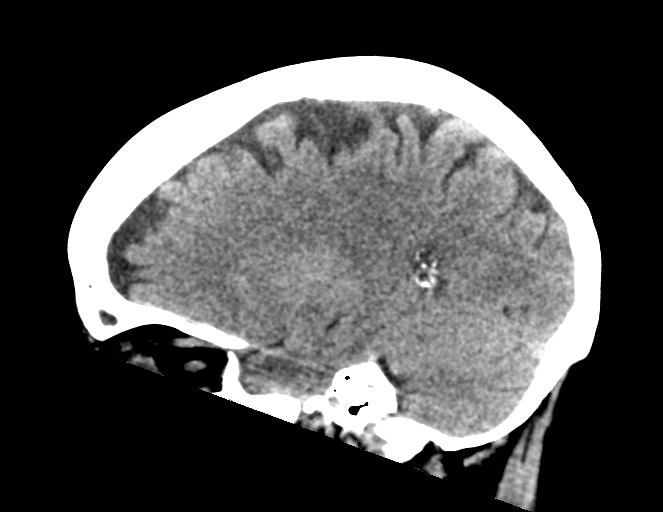

[15 of 46 positions shown; findings below may reference images not displayed]

FINDINGS: Brain: Mild generalized cerebral volume loss since 0944. No midline
shift, ventriculomegaly, mass effect, evidence of mass lesion,
intracranial hemorrhage or evidence of cortically based acute
infarction. Gray-white matter differentiation is within normal
limits throughout the brain. No encephalomalacia identified.

Vascular: Calcified atherosclerosis at the skull base. No suspicious
intracranial vascular hyperdensity.

Skull: No acute osseous abnormality identified.

Sinuses/Orbits: There is left greater than right ethmoid and frontal
sinus mucosal thickening and opacification, with possible left
frontal fluid level. See face CT reported separately.

The tympanic cavities and mastoids are clear.

Other: Visualized orbits and scalp soft tissues are within normal
limits.
IMPRESSION: 1. Normal for age noncontrast CT appearance of the brain.
2. Partially visible paranasal sinus inflammation, see also Face CT
reported separately.

## 2022-04-18 ENCOUNTER — Telehealth: Payer: Self-pay

## 2022-04-18 NOTE — Telephone Encounter (Signed)
Copied from [place] [id number]. Topic: Appointment Scheduling - Scheduling Inquiry for Clinic >> Apr 18, 2022 11:13 AM Pawlus, Brayton Layman A wrote: Reason for CRM: Pt called in to change her AWV appt on 04/23/22,  pt will not be in town and needs either a telephone appt or to change to a different date.

## 2022-04-19 ENCOUNTER — Telehealth: Payer: Self-pay | Admitting: Internal Medicine

## 2022-04-19 NOTE — Telephone Encounter (Signed)
This patient would like to reschedule her visit for another day after she comes back from vacation and would like to schedule it for a telephone visit. I was unable to do so. I explained that someone would call her to reschedule.

## 2022-04-19 NOTE — Telephone Encounter (Signed)
Contacted patient. Appt canceled and forwarded to scheduling care guide to reschedule for a later date when schedule available

## 2022-04-19 NOTE — Telephone Encounter (Signed)
Please review.  KP

## 2022-04-19 NOTE — Telephone Encounter (Signed)
Copied from [place] [id number]. Topic: Appointment Scheduling - Scheduling Inquiry for Clinic >> Apr 18, 2022 11:13 AM Rudene Anda wrote: Reason for CRM: Pt called in to change her AWV appt on 04/23/22,  pt will not be in town and needs either a telephone appt or to change to a different date. >> Apr 19, 2022  1:33 PM Ja-Kwan M wrote: Pt stated she received the call to remind her of the appt but she will not be in town. Pt requests call back to reschedule appt

## 2022-04-23 ENCOUNTER — Ambulatory Visit: Payer: Medicare Other

## 2022-04-26 ENCOUNTER — Ambulatory Visit (INDEPENDENT_AMBULATORY_CARE_PROVIDER_SITE_OTHER): Payer: Medicare Other | Admitting: Internal Medicine

## 2022-04-26 ENCOUNTER — Encounter: Payer: Self-pay | Admitting: Internal Medicine

## 2022-04-26 VITALS — BP 122/72 | HR 66 | Ht 67.0 in | Wt 190.0 lb

## 2022-04-26 DIAGNOSIS — F5101 Primary insomnia: Secondary | ICD-10-CM

## 2022-04-26 DIAGNOSIS — I700 Atherosclerosis of aorta: Secondary | ICD-10-CM

## 2022-04-26 DIAGNOSIS — I10 Essential (primary) hypertension: Secondary | ICD-10-CM

## 2022-04-26 MED ORDER — ZOLPIDEM TARTRATE 10 MG PO TABS: 10.0000 mg | ORAL_TABLET | Freq: Every day | ORAL | 1 refills | Status: DC

## 2022-04-26 NOTE — Progress Notes (Signed)
Date:  04/26/2022   Name:  Tracy Chase   DOB:  [birth year]   MRN:  [id number]   Chief Complaint: Hypertension  Hypertension This is a chronic problem. The problem is controlled. Pertinent negatives include no chest pain, headaches, palpitations or shortness of breath. There is no history of kidney disease, CAD/MI or CVA.  Depression        This is a chronic problem.  The problem has been gradually improving since onset.  Associated symptoms include insomnia.  Associated symptoms include no fatigue and no headaches.  Past treatments include SSRIs - Selective serotonin reuptake inhibitors.  Compliance with treatment is good.  Previous treatment provided significant relief. Insomnia Primary symptoms: sleep disturbance.   The problem occurs nightly. The problem is unchanged. Past treatments include medication. PMH includes: depression.     Lab Results  Component Value Date   NA 140 11/01/2021   K 4.1 11/01/2021   CO2 27 11/01/2021   GLUCOSE 97 11/01/2021   BUN 12 11/01/2021   CREATININE 0.72 11/01/2021   CALCIUM 9.5 11/01/2021   EGFR 92 11/01/2021   GFRNONAA >60 02/22/2021   Lab Results  Component Value Date   CHOL 235 (H) 11/01/2021   HDL 83 11/01/2021   LDLCALC 140 (H) 11/01/2021   TRIG 68 11/01/2021   CHOLHDL 2.8 11/01/2021   Lab Results  Component Value Date   TSH 1.630 08/11/2021   Lab Results  Component Value Date   HGBA1C 5.3 11/01/2021   Lab Results  Component Value Date   WBC 6.2 11/01/2021   HGB 13.4 11/01/2021   HCT 40.6 11/01/2021   MCV 91 11/01/2021   PLT 232 11/01/2021   Lab Results  Component Value Date   ALT 12 11/01/2021   AST 18 11/01/2021   ALKPHOS 77 11/01/2021   BILITOT 0.4 11/01/2021   No results found for: "25OHVITD2", "25OHVITD3", "VD25OH"   Review of Systems  Constitutional:  Negative for fatigue and unexpected weight change.  HENT:  Negative for nosebleeds.   Eyes:  Negative for visual disturbance.  Respiratory:  Negative for  cough, chest tightness, shortness of breath and wheezing.   Cardiovascular:  Negative for chest pain, palpitations and leg swelling.  Gastrointestinal:  Negative for abdominal pain, constipation and diarrhea.  Neurological:  Negative for dizziness, weakness, light-headedness and headaches.  Psychiatric/Behavioral:  Positive for depression and sleep disturbance. Negative for dysphoric mood. The patient has insomnia. The patient is not nervous/anxious.     Patient Active Problem List   Diagnosis Date Noted   Trigger index finger of right hand 11/01/2021   Atherosclerosis of aorta ([place]) 10/17/2021   S/P TKR (total knee replacement) using cement, left 02/21/2021   Mixed hyperlipidemia 09/25/2019   Encounter for screening colonoscopy    Polyp of colon    Degenerative disc disease, cervical 11/18/2017   Generalized anxiety disorder 06/21/2015   History of renal cell cancer 06/21/2015   Carpal tunnel syndrome 06/21/2015   Essential (primary) hypertension 06/21/2015   Idiopathic insomnia 06/21/2015    Allergies  Allergen Reactions   Hydrocodone Itching    Past Surgical History:  Procedure Laterality Date   ABDOMINAL HYSTERECTOMY     APPENDECTOMY     BACK SURGERY     "rods in back"   COLONOSCOPY WITH PROPOFOL N/A 08/28/2019   Procedure: COLONOSCOPY WITH PROPOFOL;  Surgeon: Virgel Manifold, MD;  Location: [facility];  Service: Endoscopy;  Laterality: N/A;   cryoablation for renal cell carcinoma  2012   ETHMOIDECTOMY Left 09/06/2020   Procedure: TOTAL ETHMOIDECTOMY;  Surgeon: Clyde Canterbury, MD;  Location: [place];  Service: ENT;  Laterality: Left;   FRONTAL SINUS EXPLORATION Left 09/06/2020   Procedure: FRONTAL SINUS EXPLORATION;  Surgeon: Clyde Canterbury, MD;  Location: [place];  Service: ENT;  Laterality: Left;   IMAGE GUIDED SINUS SURGERY N/A 09/06/2020   Procedure: IMAGE GUIDED SINUS SURGERY;  Surgeon: Clyde Canterbury, MD;  Location: [place];  Service: ENT;  Laterality: N/A;  need stryker disk PLACE DISK ON OR CHARGE NURSE DESK 10-01 KP   JOINT REPLACEMENT     LUMBAR FUSION     MAXILLARY ANTROSTOMY Left 09/06/2020   Procedure: MAXILLARY ANTROSTOMY WITH TISSUE;  Surgeon: Clyde Canterbury, MD;  Location: [place];  Service: ENT;  Laterality: Left;   MEDIAL PARTIAL KNEE REPLACEMENT Left    PARTIAL HYSTERECTOMY     SPHENOIDECTOMY Left 09/06/2020   Procedure: SPHENOIDECTOMY;  Surgeon: Clyde Canterbury, MD;  Location: [place];  Service: ENT;  Laterality: Left;   TONSILLECTOMY     TOTAL KNEE REVISION Left 02/21/2021   Procedure: Revision, left partial to total knee;  Surgeon: Hessie Knows, MD;  Location: [facility];  Service: Orthopedics;  Laterality: Left;  CHRIS GAINES TO ASSIST   VENTRAL HERNIA REPAIR  2012   Dr. Genevive Bi    Social History   Tobacco Use   Smoking status: Former    Types: Cigarettes    Quit date: 02/13/2021    Years since quitting: 1.1   Smokeless tobacco: Never   Tobacco comments:    was "social Smoker" over 15 yrs ago  Vaping Use   Vaping Use: Never used  Substance Use Topics   Alcohol use: Yes    Alcohol/week: 0.0 standard drinks of alcohol    Comment: socially 1-2X/mo   Drug use: No     Medication list has been reviewed and updated.  Current Meds  Medication Sig   docusate sodium (COLACE) 100 MG capsule Take 1 capsule (100 mg total) by mouth 2 (two) times daily. (Patient taking differently: Take 100 mg by mouth daily.)   FLUoxetine (PROZAC) 20 MG capsule Take 1 capsule (20 mg total) by mouth daily.   gabapentin (NEURONTIN) 300 MG capsule Take 1 capsule by mouth at bedtime.   triamterene-hydrochlorothiazide (MAXZIDE-25) 37.5-25 MG tablet Take 1 tablet by mouth daily.   zolpidem (AMBIEN) 10 MG tablet Take 1 tablet (10 mg total) by mouth at bedtime.       04/26/2022    9:52 AM 11/01/2021    9:58 AM 08/11/2021   11:11 AM 12/21/2020    2:14 PM  GAD 7 : Generalized Anxiety Score   Nervous, Anxious, on Edge 0 1 3 2   Control/stop worrying 2 3 0 2  Worry too much - different things 2 3 2 2   Trouble relaxing 1 2 0 0  Restless 1 0 0 0  Easily annoyed or irritable 0 2 2 0  Afraid - awful might happen 2 2 2  0  Total GAD 7 Score 8 13 9 6   Anxiety Difficulty Not difficult at all Not difficult at all Not difficult at all Not difficult at all       04/26/2022    9:52 AM  Depression screen PHQ 2/9  Decreased Interest 0  Down, Depressed, Hopeless 0  PHQ - 2 Score 0  Altered sleeping 1  Tired, decreased energy 2  Change in appetite 0  Feeling  bad or failure about yourself  0  Trouble concentrating 0  Moving slowly or fidgety/restless 0  Suicidal thoughts 0  PHQ-9 Score 3  Difficult doing work/chores Not difficult at all    BP Readings from Last 3 Encounters:  04/26/22 122/72  11/01/21 104/72  08/11/21 100/62    Physical Exam Vitals and nursing note reviewed.  Constitutional:      General: She is not in acute distress.    Appearance: She is well-developed.  HENT:     Head: Normocephalic and atraumatic.  Cardiovascular:     Rate and Rhythm: Normal rate and regular rhythm.  Pulmonary:     Effort: Pulmonary effort is normal. No respiratory distress.     Breath sounds: No wheezing or rhonchi.  Musculoskeletal:        General: Swelling present. Normal range of motion.     Cervical back: Normal range of motion.     Right lower leg: No edema.     Left lower leg: No edema.  Skin:    General: Skin is warm and dry.     Capillary Refill: Capillary refill takes less than 2 seconds.     Findings: No rash.  Neurological:     General: No focal deficit present.     Mental Status: She is alert and oriented to person, place, and time.  Psychiatric:        Mood and Affect: Mood normal.        Behavior: Behavior normal.     Wt Readings from Last 3 Encounters:  04/26/22 190 lb (86.2 kg)  11/01/21 187 lb (84.8 kg)  08/11/21 186 lb 12.8 oz (84.7 kg)    BP  122/72   Pulse 66   Ht 5' 7"  (1.702 m)   Wt 190 lb (86.2 kg)   SpO2 98%   BMI 29.76 kg/m   Assessment and Plan: 1. Essential (primary) hypertension Clinically stable exam with well controlled BP. Tolerating medications without side effects at this time. Pt to continue current regimen and low sodium diet; benefits of regular exercise as able discussed.  2. Idiopathic insomnia Doing well on Ambien nightly. - zolpidem (AMBIEN) 10 MG tablet; Take 1 tablet (10 mg total) by mouth at bedtime.  Dispense: 90 tablet; Refill: 1  3. Atherosclerosis of aorta ([facility]) Previously on Zetia - pt stopped for unclear reasons Has declined Statin therapy   Partially dictated using Editor, commissioning. Any errors are unintentional.  Halina Maidens, MD [facility]  04/26/2022

## 2022-05-02 ENCOUNTER — Ambulatory Visit: Payer: Medicare Other | Admitting: Internal Medicine

## 2022-05-04 ENCOUNTER — Telehealth: Payer: Self-pay | Admitting: Internal Medicine

## 2022-07-06 ENCOUNTER — Other Ambulatory Visit: Payer: Self-pay | Admitting: Internal Medicine

## 2022-07-06 DIAGNOSIS — I10 Essential (primary) hypertension: Secondary | ICD-10-CM

## 2022-07-09 NOTE — Telephone Encounter (Signed)
Pharmacy request #[id number] for full Rx benefit- original Rx 11/01/21 #90 3RF- 1 yr. Remaining RF adjusted to #100 Requested Prescriptions  Pending Prescriptions Disp Refills  . triamterene-hydrochlorothiazide (MAXZIDE-25) 37.5-25 MG tablet [Pharmacy Med Name: Triamterene-HCTZ 37.5-25 MG Oral Tablet] 100 tablet 1    Sig: TAKE 1 TABLET BY MOUTH DAILY     Cardiovascular: Diuretic Combos Failed - 07/06/2022 11:13 PM      Failed - K in normal range and within 180 days    Potassium  Date Value Ref Range Status  11/01/2021 4.1 3.5 - 5.2 mmol/L Final  05/11/2014 3.5 3.5 - 5.1 mmol/L Final         Failed - Na in normal range and within 180 days    Sodium  Date Value Ref Range Status  11/01/2021 140 134 - 144 mmol/L Final  05/11/2014 137 136 - 145 mmol/L Final         Failed - Cr in normal range and within 180 days    Creatinine  Date Value Ref Range Status  05/11/2014 1.02 0.60 - 1.30 mg/dL Final   Creatinine, Ser  Date Value Ref Range Status  11/01/2021 0.72 0.57 - 1.00 mg/dL Final         Passed - Last BP in normal range    BP Readings from Last 1 Encounters:  04/26/22 122/72         Passed - Valid encounter within last 6 months    Recent Outpatient Visits          2 months ago Essential (primary) hypertension   [place] [facility] at [facility], Jesse Sans, MD   8 months ago Annual physical exam   [place] [facility] at [facility], Jesse Sans, MD   11 months ago Left arm pain   [place] [facility] at [facility], Jesse Sans, MD   1 year ago Acute cystitis without hematuria   [place] [facility] at [facility], Jesse Sans, MD   1 year ago Annual physical exam   [facility] at [facility], Jesse Sans, MD      Future Appointments            In 4 months [facility], Jesse Sans, MD [place] and [facility] at [facility], [facility]

## 2022-07-13 ENCOUNTER — Ambulatory Visit (INDEPENDENT_AMBULATORY_CARE_PROVIDER_SITE_OTHER): Payer: Medicare Other

## 2022-07-13 DIAGNOSIS — Z0000 Encounter for general adult medical examination without abnormal findings: Secondary | ICD-10-CM | POA: Diagnosis not present

## 2022-07-13 NOTE — Progress Notes (Signed)
I connected with  [name] on 07/13/22 by a audio enabled telemedicine application and verified that I am speaking with the correct person using two identifiers.  Patient Location: Home  Provider Location: Office/Clinic  I discussed the limitations of evaluation and management by telemedicine. The patient expressed understanding and agreed to proceed.   Subjective:   Tracy Chase is a 66 y.o. female who presents for Medicare Annual (Subsequent) preventive examination.  Review of Systems    Per HPI unless specifically indicated below Cardiac Risk Factors include: advanced age (>51mn, >>73women);female gender, essential hypertension and hyperlipidemia.           Objective:       04/26/2022    9:48 AM 11/01/2021    9:47 AM 08/11/2021   11:03 AM  Vitals with BMI  Height '5\' 7"'$  '5\' 7"'$  '5\' 7"'$   Weight 190 lbs 187 lbs 186 lbs 13 oz  BMI 29.75 [phone] Systolic [id number] Diastolic 72 72 62  Pulse 66 70 70    Today's Vitals   07/13/22 0857  PainSc: 3    There is no height or weight on file to calculate BMI.     04/19/2021    9:29 AM 02/21/2021    1:05 PM 02/13/2021   10:21 AM 09/06/2020    7:19 AM 06/23/2020    6:17 PM 04/18/2020    9:38 AM 08/28/2019    7:14 AM  Advanced Directives  Does Patient Have a Medical Advance Directive? No No No No No No No  Does patient want to make changes to medical advance directive?  No - Patient declined       Would patient like information on creating a medical advance directive? Yes (MAU/Ambulatory/Procedural Areas - Information given)   Yes (MAU/Ambulatory/Procedural Areas - Information given) No - Patient declined No - Patient declined     Current Medications (verified) Outpatient Encounter Medications as of 07/13/2022  Medication Sig   docusate sodium (COLACE) 100 MG capsule Take 1 capsule (100 mg total) by mouth 2 (two) times daily. (Patient taking differently: Take 100 mg by mouth daily as needed.)   FLUoxetine (PROZAC) 20 MG  capsule Take 1 capsule (20 mg total) by mouth daily.   gabapentin (NEURONTIN) 300 MG capsule Take 1 capsule by mouth at bedtime.   triamterene-hydrochlorothiazide (MAXZIDE-25) 37.5-25 MG tablet TAKE 1 TABLET BY MOUTH DAILY (Patient taking differently: Take 1 tablet by mouth daily as needed.)   zolpidem (AMBIEN) 10 MG tablet Take 1 tablet (10 mg total) by mouth at bedtime.   No facility-administered encounter medications on file as of 07/13/2022.    Allergies (verified) Hydrocodone   History: Past Medical History:  Diagnosis Date   Arthritis    Cancer ([place]    RENAL CELL   Depression    GERD (gastroesophageal reflux disease)    OCC NO MEDS   Insomnia    Past Surgical History:  Procedure Laterality Date   ABDOMINAL HYSTERECTOMY     APPENDECTOMY     BACK SURGERY     "rods in back"   COLONOSCOPY WITH PROPOFOL N/A 08/28/2019   Procedure: COLONOSCOPY WITH PROPOFOL;  Surgeon: TVirgel Manifold MD;  Location: [facility];  Service: Endoscopy;  Laterality: N/A;   cryoablation for renal cell carcinoma  2012   ETHMOIDECTOMY Left 09/06/2020   Procedure: TOTAL ETHMOIDECTOMY;  Surgeon: BClyde Canterbury MD;  [place]  Service: ENT;  Laterality: Left;   FRONTAL SINUS EXPLORATION  Left 09/06/2020   Procedure: FRONTAL SINUS EXPLORATION;  Surgeon: Clyde Canterbury, MD;  Location: [place];  Service: ENT;  Laterality: Left;   IMAGE GUIDED SINUS SURGERY N/A 09/06/2020   Procedure: IMAGE GUIDED SINUS SURGERY;  Surgeon: Clyde Canterbury, MD;  Location: [place];  Service: ENT;  Laterality: N/A;  need stryker disk PLACE DISK ON OR CHARGE NURSE DESK 10-01 KP   JOINT REPLACEMENT     LUMBAR FUSION     MAXILLARY ANTROSTOMY Left 09/06/2020   Procedure: MAXILLARY ANTROSTOMY WITH TISSUE;  Surgeon: Clyde Canterbury, MD;  Location: [place];  Service: ENT;  Laterality: Left;   MEDIAL PARTIAL KNEE REPLACEMENT Left    PARTIAL HYSTERECTOMY     SPHENOIDECTOMY  Left 09/06/2020   Procedure: SPHENOIDECTOMY;  Surgeon: Clyde Canterbury, MD;  Location: [place];  Service: ENT;  Laterality: Left;   TONSILLECTOMY     TOTAL KNEE REVISION Left 02/21/2021   Procedure: Revision, left partial to total knee;  Surgeon: Hessie Knows, MD;  Location: [facility];  Service: Orthopedics;  Laterality: Left;  CHRIS GAINES TO ASSIST   VENTRAL HERNIA REPAIR  2012   Dr. Genevive Bi   Family History  Problem Relation Age of Onset   Kidney failure Mother    Congestive Heart Failure Father    Breast cancer Neg Hx    Social History   Socioeconomic History   Marital status: Significant Other    Spouse name: Not on file   Number of children: 1   Years of education: Not on file   Highest education level: Not on file  Occupational History   Occupation: Retired  Tobacco Use   Smoking status: Former    Types: Cigarettes    Quit date: 1998    Years since quitting: 25.6   Smokeless tobacco: Never   Tobacco comments:    was "social Smoker" over 15 yrs ago  Vaping Use   Vaping Use: Never used  Substance and Sexual Activity   Alcohol use: Yes    Alcohol/week: 0.0 standard drinks of alcohol    Comment: socially 1-2X/mo   Drug use: No   Sexual activity: Not on file  Other Topics Concern   Not on file  Social History Narrative   Pt has 1 son and 2 step daughters. Lives with significant other   Social Determinants of Health   Financial Resource Strain: Low Risk  (07/13/2022)   Overall Financial Resource Strain (CARDIA)    Difficulty of Paying Living Expenses: Not hard at all  Food Insecurity: No Food Insecurity (07/13/2022)   Hunger Vital Sign    Worried About Running Out of Food in the Last Year: Never true    Ran Out of Food in the Last Year: Never true  Transportation Needs: No Transportation Needs (07/13/2022)   PRAPARE - Hydrologist (Medical): No    Lack of Transportation (Non-Medical): No  Physical Activity: Insufficiently Active  (07/13/2022)   Exercise Vital Sign    Days of Exercise per Week: 3 days    Minutes of Exercise per Session: 30 min  Stress: Stress Concern Present (04/19/2021)   [place]    Feeling of Stress : To some extent  Social Connections: Moderately Isolated (07/13/2022)   Social Connection and Isolation Panel [NHANES]    Frequency of Communication with Friends and Family: More than three times a week    Frequency of Social Gatherings with Friends  and Family: More than three times a week    Attends Religious Services: Never    Active Member of Clubs or Organizations: No    Attends Music therapist: Not on file    Marital Status: Living with partner    Tobacco Counseling Counseling given: Not Answered Tobacco comments: was "social Smoker" over 15 yrs ago   Clinical Intake:  Pre-visit preparation completed: No  Pain : 0-10 Pain Score: 3  Pain Type: Acute pain Pain Location: Abdomen Pain Orientation: Mid Pain Descriptors / Indicators: Aching Pain Onset: More than a month ago Pain Frequency: Intermittent     Nutritional Status: BMI 25 -29 Overweight Nutritional Risks: None  How often do you need to have someone help you when you read instructions, pamphlets, or other written materials from your doctor or pharmacy?: 1 - Never  Diabetic?no  Interpreter Needed?: No  Information entered by :: Donnie Mesa, CMA   Activities of Daily Living    07/13/2022    8:54 AM 11/01/2021    9:59 AM  In your present state of health, do you have any difficulty performing the following activities:  Hearing? 0 0  Vision? 0 0  Difficulty concentrating or making decisions? 0 0  Walking or climbing stairs? 0 1  Dressing or bathing? 0 0  Doing errands, shopping? 0 0    Patient Care Team: Glean Hess, MD as PCP - General (Internal Medicine) Kate Sable, MD as PCP - Cardiology (Cardiology) Hessie Knows,  MD as Consulting Physician (Orthopedic Surgery) Sharlet Salina, MD as Referring Physician (Physical Medicine and Rehabilitation) Rico Junker, RN as Registered Nurse Theodore Demark, RN (Inactive) as Registered Nurse Ree Edman, MD (Dermatology)  Indicate any recent Medical Services you may have received from other than [facility] providers in the past year (date may be approximate). No hospitalization in the past 12 months.    Assessment:   This is a routine wellness examination for [place].  Hearing/Vision screen Denies any hearing issues. Denies any vision issues. Annual Eye Exam.  Dietary issues and exercise activities discussed: Current Exercise Habits: Structured exercise class, Type of exercise: walking, Time (Minutes): 30, Frequency (Times/Week): 3, Weekly Exercise (Minutes/Week): 90, Intensity: Moderate   Goals Addressed   None    Depression Screen    07/13/2022    8:53 AM 04/26/2022    9:52 AM 11/01/2021    9:58 AM 08/11/2021   11:11 AM 04/19/2021    9:28 AM 12/21/2020    2:14 PM 10/12/2020    8:08 AM  PHQ 2/9 Scores  PHQ - 2 Score 0 0 1 5 0 2 0  PHQ- 9 Score  '3 5 8  6 '$ 0    Fall Risk    07/13/2022    8:54 AM 04/26/2022    9:52 AM 11/01/2021    9:59 AM 08/11/2021   11:12 AM 04/19/2021    9:30 AM  Fall Risk   Falls in the past year? 0 0 0 0 0  Number falls in past yr: 0 0 0 0 0  Injury with Fall? 0 0 0 0 0  Risk for fall due to : No Fall Risks No Fall Risks No Fall Risks No Fall Risks Impaired balance/gait  Follow up Falls evaluation completed Falls evaluation completed Falls evaluation completed Falls evaluation completed Falls prevention discussed    FALL RISK PREVENTION PERTAINING TO THE HOME:  Any stairs in or around the home? Yes  If so, are there any without  handrails? No  Home free of loose throw rugs in walkways, pet beds, electrical cords, etc? Yes Adequate lighting in your home to reduce risk of falls? Yes   ASSISTIVE DEVICES UTILIZED TO PREVENT  FALLS:  Life alert? No  Use of a cane, walker or w/c? No  Grab bars in the bathroom? No  Shower chair or bench in shower? No  Elevated toilet seat or a handicapped toilet? No   TIMED UP AND GO:  Was the test performed?  unable to perform, virtual appt .    Cognitive Function:        07/13/2022    8:55 AM 04/18/2020    9:41 AM  6CIT Screen  What Year? 0 points 0 points  What month? 0 points 0 points  What time? 0 points 0 points  Count back from 20 0 points 0 points  Months in reverse 0 points 0 points  Repeat phrase 0 points 0 points  Total Score 0 points 0 points    Immunizations Immunization History  Administered Date(s) Administered   Fluad Quad(high Dose 65+) 09/02/2019   Influenza Inj Mdck Quad Pf 08/18/2018   Influenza,inj,Quad PF,6+ Mos 10/02/2016, 09/03/2017   Influenza-Unspecified 08/26/2020, 08/12/2021   PFIZER(Purple Top)SARS-COV-2 Vaccination 02/08/2020, 03/01/2020, 09/30/2020   Pfizer Covid-19 Vaccine Bivalent Booster 48yr & up 05/16/2021   Pneumococcal Conjugate-13 09/23/2019   Pneumococcal Polysaccharide-23 10/12/2020   Tdap 04/29/2012   Zoster Recombinat (Shingrix) 08/13/2021, 11/20/2021    TDAP: Not up to date, Pt informed that she can get her TDAP vaccine at her local pharmacy.   Flu Vaccine status: Up to date  Pneumococcal vaccine status: Up to date  Covid-19 vaccine status: Completed vaccines  Qualifies for Shingles Vaccine? Yes   Zostavax completed Yes   Shingrix vaccine: Yes Screening Tests Health Maintenance  Topic Date Due   COVID-19 Vaccine (5 - Pfizer series) 09/16/2021   TETANUS/TDAP  04/29/2022   INFLUENZA VACCINE  06/12/2022   MAMMOGRAM  12/08/2022   COLONOSCOPY (Pts 45-474yrInsurance coverage will need to be confirmed)  08/27/2024   Pneumonia Vaccine 6569Years old  Completed   DEXA SCAN  Completed   Hepatitis C Screening  Completed   Zoster Vaccines- Shingrix  Completed   HPV VACCINES  Aged Out    Health  Maintenance  Health Maintenance Due  Topic Date Due   COVID-19 Vaccine (5 - Pfizer series) 09/16/2021   TETANUS/TDAP  04/29/2022   INFLUENZA VACCINE  06/12/2022    Colorectal cancer screening: Type of screening: Colonoscopy. Completed 08/28/2019. Repeat every 5 years  Mammogram status: Completed 12/08/2021. Repeat every year  DEXA Scan: completed 10/07/2019  Lung Cancer Screening: (Low Dose CT Chest recommended if Age 68-80ears, 30 pack-year currently smoking OR have quit w/in 15years.) does not qualify.   Lung Cancer Screening Referral: does not qualify  Additional Screening:  Hepatitis C Screening: does qualify; Completed 11/01/2021  Vision Screening: Recommended annual ophthalmology exams for early detection of glaucoma and other disorders of the eye. Is the patient up to date with their annual eye exam?  Yes  Who is the provider or what is the name of the office in which the patient attends annual eye exams?  If pt is not established with a provider, would they like to be referred to a provider to establish care? No .   Dental Screening: Recommended annual dental exams for proper oral hygiene  Community Resource Referral / Chronic Care Management: CRR required this visit?  No  CCM required this visit?  No      Plan:     I have personally reviewed and noted the following in the patient's chart:   Medical and social history Use of alcohol, tobacco or illicit drugs  Current medications and supplements including opioid prescriptions. Patient is not currently taking opioid prescriptions. Functional ability and status Nutritional status Physical activity Advanced directives List of other physicians Hospitalizations, surgeries, and ER visits in previous 12 months Vitals Screenings to include cognitive, depression, and falls Referrals and appointments  In addition, I have reviewed and discussed with patient certain preventive protocols, quality metrics, and best  practice recommendations. A written personalized care plan for preventive services as well as general preventive health recommendations were provided to patient.      Ms. Habermehl , Thank you for taking time to come for your Medicare Wellness Visit. I appreciate your ongoing commitment to your health goals. Please review the following plan we discussed and let me know if I can assist you in the future.   These are the goals we discussed:  Goals      Weight (lb) < 200 lb (90.7 kg)     Pt would like to lose weight over the next year with healthy eating and physical activity        This is a list of the screening recommended for you and due dates:  Health Maintenance  Topic Date Due   COVID-19 Vaccine (5 - Pfizer series) 09/16/2021   Tetanus Vaccine  04/29/2022   Flu Shot  06/12/2022   Mammogram  12/08/2022   Colon Cancer Screening  08/27/2024   Pneumonia Vaccine  Completed   DEXA scan (bone density measurement)  Completed   Hepatitis C Screening: USPSTF Recommendation to screen - Ages 89-79 yo.  Completed   Zoster (Shingles) Vaccine  Completed   HPV Vaccine  Aged [address] [place]   07/13/2022   Nurse Notes: 30 minute Non-face-to-face visit

## 2022-07-13 NOTE — Patient Instructions (Signed)

## 2022-07-27 ENCOUNTER — Encounter: Payer: Self-pay | Admitting: Internal Medicine

## 2022-07-27 ENCOUNTER — Ambulatory Visit (INDEPENDENT_AMBULATORY_CARE_PROVIDER_SITE_OTHER): Payer: Medicare Other | Admitting: Internal Medicine

## 2022-07-27 VITALS — BP 124/72 | HR 96 | Ht 67.0 in | Wt 187.0 lb

## 2022-07-27 DIAGNOSIS — M546 Pain in thoracic spine: Secondary | ICD-10-CM

## 2022-07-27 DIAGNOSIS — Z23 Encounter for immunization: Secondary | ICD-10-CM | POA: Diagnosis not present

## 2022-07-27 DIAGNOSIS — R1013 Epigastric pain: Secondary | ICD-10-CM

## 2022-07-27 NOTE — Progress Notes (Signed)
Date:  07/27/2022   Name:  Tracy Chase   DOB:  [birth year]   MRN:  [id number]   Chief Complaint: Abdominal Pain (6 weeks ago had a terrible episode of pain - felt like "something was tearing through me." Lower back pain, upper shoulder blade pains. Having some diarrhea, and some constipation. Breaking out in hot sweats.)  Abdominal Pain This is a new problem. Episode onset: in the past 6 weeks. The onset quality is sudden (occured once for about 15 seconds and since then her back has been hurting). The pain is severe. The quality of the pain is tearing. The abdominal pain radiates to the back. Associated symptoms include diarrhea and myalgias. Pertinent negatives include no fever, nausea or vomiting.  Back Pain This is a new problem. The current episode started more than 1 month ago. The problem occurs daily. The problem is unchanged. The pain is present in the thoracic spine (and right side of mid back). The pain is moderate. Associated symptoms include abdominal pain. Pertinent negatives include no chest pain or fever.    Lab Results  Component Value Date   NA 140 11/01/2021   K 4.1 11/01/2021   CO2 27 11/01/2021   GLUCOSE 97 11/01/2021   BUN 12 11/01/2021   CREATININE 0.72 11/01/2021   CALCIUM 9.5 11/01/2021   EGFR 92 11/01/2021   GFRNONAA >60 02/22/2021   Lab Results  Component Value Date   CHOL 235 (H) 11/01/2021   HDL 83 11/01/2021   LDLCALC 140 (H) 11/01/2021   TRIG 68 11/01/2021   CHOLHDL 2.8 11/01/2021   Lab Results  Component Value Date   TSH 1.630 08/11/2021   Lab Results  Component Value Date   HGBA1C 5.3 11/01/2021   Lab Results  Component Value Date   WBC 6.2 11/01/2021   HGB 13.4 11/01/2021   HCT 40.6 11/01/2021   MCV 91 11/01/2021   PLT 232 11/01/2021   Lab Results  Component Value Date   ALT 12 11/01/2021   AST 18 11/01/2021   ALKPHOS 77 11/01/2021   BILITOT 0.4 11/01/2021   No results found for: "25OHVITD2", "25OHVITD3", "VD25OH"   Review  of Systems  Constitutional:  Positive for diaphoresis and fatigue. Negative for chills and fever.  Respiratory:  Negative for chest tightness and shortness of breath.   Cardiovascular:  Negative for chest pain.  Gastrointestinal:  Positive for abdominal pain and diarrhea. Negative for nausea and vomiting.  Musculoskeletal:  Positive for back pain and myalgias.  Psychiatric/Behavioral:  Positive for sleep disturbance. Negative for dysphoric mood. The patient is nervous/anxious.     Patient Active Problem List   Diagnosis Date Noted   Trigger index finger of right hand 11/01/2021   Atherosclerosis of aorta ([place]) 10/17/2021   S/P TKR (total knee replacement) using cement, left 02/21/2021   Mixed hyperlipidemia 09/25/2019   Encounter for screening colonoscopy    Polyp of colon    Degenerative disc disease, cervical 11/18/2017   Generalized anxiety disorder 06/21/2015   History of renal cell cancer 06/21/2015   Carpal tunnel syndrome 06/21/2015   Essential (primary) hypertension 06/21/2015   Idiopathic insomnia 06/21/2015    Allergies  Allergen Reactions   Hydrocodone Itching    Past Surgical History:  Procedure Laterality Date   ABDOMINAL HYSTERECTOMY     APPENDECTOMY     BACK SURGERY     "rods in back"   COLONOSCOPY WITH PROPOFOL N/A 08/28/2019   Procedure: COLONOSCOPY WITH PROPOFOL;  Surgeon:  Virgel Manifold, MD;  Location: [facility];  Service: Endoscopy;  Laterality: N/A;   cryoablation for renal cell carcinoma  2012   ETHMOIDECTOMY Left 09/06/2020   Procedure: TOTAL ETHMOIDECTOMY;  Surgeon: Clyde Canterbury, MD;  Location: [place];  Service: ENT;  Laterality: Left;   FRONTAL SINUS EXPLORATION Left 09/06/2020   Procedure: FRONTAL SINUS EXPLORATION;  Surgeon: Clyde Canterbury, MD;  Location: [place];  Service: ENT;  Laterality: Left;   IMAGE GUIDED SINUS SURGERY N/A 09/06/2020   Procedure: IMAGE GUIDED SINUS SURGERY;  Surgeon: Clyde Canterbury, MD;   Location: [place];  Service: ENT;  Laterality: N/A;  need stryker disk PLACE DISK ON OR CHARGE NURSE DESK 10-01 KP   JOINT REPLACEMENT     LUMBAR FUSION     MAXILLARY ANTROSTOMY Left 09/06/2020   Procedure: MAXILLARY ANTROSTOMY WITH TISSUE;  Surgeon: Clyde Canterbury, MD;  Location: [place];  Service: ENT;  Laterality: Left;   MEDIAL PARTIAL KNEE REPLACEMENT Left    PARTIAL HYSTERECTOMY     SPHENOIDECTOMY Left 09/06/2020   Procedure: SPHENOIDECTOMY;  Surgeon: Clyde Canterbury, MD;  Location: [place];  Service: ENT;  Laterality: Left;   TONSILLECTOMY     TOTAL KNEE REVISION Left 02/21/2021   Procedure: Revision, left partial to total knee;  Surgeon: Hessie Knows, MD;  Location: [facility];  Service: Orthopedics;  Laterality: Left;  CHRIS GAINES TO ASSIST   VENTRAL HERNIA REPAIR  2012   Dr. Genevive Bi    Social History   Tobacco Use   Smoking status: Former    Types: Cigarettes    Quit date: 1998    Years since quitting: 25.7   Smokeless tobacco: Never   Tobacco comments:    was "social Smoker" over 15 yrs ago  Vaping Use   Vaping Use: Never used  Substance Use Topics   Alcohol use: Yes    Alcohol/week: 0.0 standard drinks of alcohol    Comment: socially 1-2X/mo   Drug use: No     Medication list has been reviewed and updated.  Current Meds  Medication Sig   docusate sodium (COLACE) 100 MG capsule Take 1 capsule (100 mg total) by mouth 2 (two) times daily. (Patient taking differently: Take 100 mg by mouth daily as needed.)   FLUoxetine (PROZAC) 20 MG capsule Take 1 capsule (20 mg total) by mouth daily.   gabapentin (NEURONTIN) 300 MG capsule Take 1 capsule by mouth at bedtime.   triamterene-hydrochlorothiazide (MAXZIDE-25) 37.5-25 MG tablet TAKE 1 TABLET BY MOUTH DAILY (Patient taking differently: Take 1 tablet by mouth daily as needed.)   zolpidem (AMBIEN) 10 MG tablet Take 1 tablet (10 mg total) by mouth at bedtime.       04/26/2022    9:52 AM  11/01/2021    9:58 AM 08/11/2021   11:11 AM 12/21/2020    2:14 PM  GAD 7 : Generalized Anxiety Score  Nervous, Anxious, on Edge 0 1 3 2   Control/stop worrying 2 3 0 2  Worry too much - different things 2 3 2 2   Trouble relaxing 1 2 0 0  Restless 1 0 0 0  Easily annoyed or irritable 0 2 2 0  Afraid - awful might happen 2 2 2  0  Total GAD 7 Score 8 13 9 6   Anxiety Difficulty Not difficult at all Not difficult at all Not difficult at all Not difficult at all       07/13/2022    8:53 AM 04/26/2022  9:52 AM 11/01/2021    9:58 AM  Depression screen PHQ 2/9  Decreased Interest 0 0 0  Down, Depressed, Hopeless 0 0 1  PHQ - 2 Score 0 0 1  Altered sleeping  1 1  Tired, decreased energy  2 1  Change in appetite  0 0  Feeling bad or failure about yourself   0 1  Trouble concentrating  0 1  Moving slowly or fidgety/restless  0 0  Suicidal thoughts  0 0  PHQ-9 Score  3 5  Difficult doing work/chores  Not difficult at all Not difficult at all    BP Readings from Last 3 Encounters:  07/27/22 124/72  04/26/22 122/72  11/01/21 104/72    Physical Exam Constitutional:      Appearance: She is well-developed.     Comments: Appears mildly anxious  Abdominal:     General: Abdomen is flat.     Palpations: Abdomen is soft. There is no hepatomegaly or splenomegaly.     Tenderness: There is abdominal tenderness in the epigastric area. There is no right CVA tenderness, left CVA tenderness, guarding or rebound.  Musculoskeletal:     Thoracic back: Tenderness present.       Back:     Comments: Mild spasm appreciated  Neurological:     Mental Status: She is alert.     Wt Readings from Last 3 Encounters:  07/27/22 187 lb (84.8 kg)  04/26/22 190 lb (86.2 kg)  11/01/21 187 lb (84.8 kg)    BP 124/72   Pulse 96   Ht 5' 7"  (1.702 m)   Wt 187 lb (84.8 kg)   SpO2 97%   BMI 29.29 kg/m   Assessment and Plan: 1. Acute right-sided thoracic back pain Starting with a tearing sensation in  the mid abdomen 6 weeks ago Now generally uncomfortable She has HTN and atherosclerosis as risk factors. If pain becomes severe, she should go to the ED - CT ANGIO CHEST AORTA W/CM & OR WO/CM  2. Epigastric pain Unclear etiology - will get labs Consider [place] if symptoms recur - CBC with Differential/Platelet - Comprehensive metabolic panel - Amylase   Partially dictated using Dragon software. Any errors are unintentional.  Halina Maidens, MD [facility]  07/27/2022

## 2022-07-28 LAB — COMPREHENSIVE METABOLIC PANEL
ALT: 11 IU/L (ref 0–32)
AST: 10 IU/L (ref 0–40)
Albumin/Globulin Ratio: 2.4 — ABNORMAL HIGH (ref 1.2–2.2)
Albumin: 4.3 g/dL (ref 3.9–4.9)
Alkaline Phosphatase: 57 IU/L (ref 44–121)
BUN/Creatinine Ratio: 10 — ABNORMAL LOW (ref 12–28)
BUN: 7 mg/dL — ABNORMAL LOW (ref 8–27)
Bilirubin Total: 0.3 mg/dL (ref 0.0–1.2)
CO2: 26 mmol/L (ref 20–29)
Calcium: 9.3 mg/dL (ref 8.7–10.3)
Chloride: 103 mmol/L (ref 96–106)
Creatinine, Ser: 0.69 mg/dL (ref 0.57–1.00)
Globulin, Total: 1.8 g/dL (ref 1.5–4.5)
Glucose: 93 mg/dL (ref 70–99)
Potassium: 3.8 mmol/L (ref 3.5–5.2)
Sodium: 141 mmol/L (ref 134–144)
Total Protein: 6.1 g/dL (ref 6.0–8.5)
eGFR: 94 mL/min/{1.73_m2} (ref >59)

## 2022-07-28 LAB — CBC WITH DIFFERENTIAL/PLATELET
Basophils Absolute: 0 10*3/uL (ref 0.0–0.2)
Basos: 1 %
EOS (ABSOLUTE): 0.1 10*3/uL (ref 0.0–0.4)
Eos: 2 %
Hematocrit: 36.5 % (ref 34.0–46.6)
Hemoglobin: 12.2 g/dL (ref 11.1–15.9)
Immature Grans (Abs): 0 10*3/uL (ref 0.0–0.1)
Immature Granulocytes: 0 %
Lymphocytes Absolute: 3 10*3/uL (ref 0.7–3.1)
Lymphs: 56 %
MCH: 30 pg (ref 26.6–33.0)
MCHC: 33.4 g/dL (ref 31.5–35.7)
MCV: 90 fL (ref 79–97)
Monocytes Absolute: 0.4 10*3/uL (ref 0.1–0.9)
Monocytes: 8 %
Neutrophils Absolute: 1.8 10*3/uL (ref 1.4–7.0)
Neutrophils: 33 %
Platelets: 200 10*3/uL (ref 150–450)
RBC: 4.07 x10E6/uL (ref 3.77–5.28)
RDW: 11.9 % (ref 11.7–15.4)
WBC: 5.3 10*3/uL (ref 3.4–10.8)

## 2022-07-28 LAB — AMYLASE: Amylase: 49 U/L (ref 31–110)

## 2022-07-31 ENCOUNTER — Ambulatory Visit
Admission: RE | Admit: 2022-07-31 | Discharge: 2022-07-31 | Disposition: A | Discharge status: Home / Self Care | Payer: Medicare Other | Source: Ambulatory Visit | Attending: Internal Medicine | Admitting: Internal Medicine

## 2022-07-31 DIAGNOSIS — M546 Pain in thoracic spine: Secondary | ICD-10-CM | POA: Diagnosis not present

## 2022-07-31 DIAGNOSIS — R079 Chest pain, unspecified: Secondary | ICD-10-CM | POA: Diagnosis not present

## 2022-07-31 MED ORDER — IOHEXOL 350 MG/ML SOLN
75.0000 mL | Freq: Once | INTRAVENOUS | Status: AC | PRN
  Administered 2022-07-31: 75 mL via INTRAVENOUS

## 2022-08-01 ENCOUNTER — Telehealth: Payer: Self-pay | Admitting: *Deleted

## 2022-08-01 ENCOUNTER — Other Ambulatory Visit: Payer: Self-pay | Admitting: Internal Medicine

## 2022-08-01 DIAGNOSIS — M899 Disorder of bone, unspecified: Secondary | ICD-10-CM

## 2022-08-01 NOTE — Telephone Encounter (Signed)
CT angio Chest Results per Malachy Mood at [facility]. There is a lesion in the right 4th rib, non aggressive appearance. Right thoracic pain because it is near the T spine, recommend Thoracic MRI. Full report to be sent.

## 2022-08-06 ENCOUNTER — Ambulatory Visit
Admission: RE | Admit: 2022-08-06 | Discharge: 2022-08-06 | Disposition: A | Discharge status: Home / Self Care | Payer: Medicare Other | Source: Ambulatory Visit | Attending: Internal Medicine | Admitting: Internal Medicine

## 2022-08-06 DIAGNOSIS — M899 Disorder of bone, unspecified: Secondary | ICD-10-CM | POA: Diagnosis not present

## 2022-08-06 DIAGNOSIS — M40204 Unspecified kyphosis, thoracic region: Secondary | ICD-10-CM | POA: Diagnosis not present

## 2022-08-06 DIAGNOSIS — M546 Pain in thoracic spine: Secondary | ICD-10-CM | POA: Diagnosis not present

## 2022-08-09 ENCOUNTER — Encounter: Payer: Self-pay | Admitting: Internal Medicine

## 2022-08-09 DIAGNOSIS — M899 Disorder of bone, unspecified: Secondary | ICD-10-CM | POA: Insufficient documentation

## 2022-08-10 ENCOUNTER — Other Ambulatory Visit: Payer: Self-pay | Admitting: Internal Medicine

## 2022-08-10 DIAGNOSIS — M899 Disorder of bone, unspecified: Secondary | ICD-10-CM

## 2022-08-24 DIAGNOSIS — M545 Low back pain, unspecified: Secondary | ICD-10-CM | POA: Diagnosis not present

## 2022-08-28 DIAGNOSIS — D2372 Other benign neoplasm of skin of left lower limb, including hip: Secondary | ICD-10-CM | POA: Diagnosis not present

## 2022-08-28 DIAGNOSIS — C44729 Squamous cell carcinoma of skin of left lower limb, including hip: Secondary | ICD-10-CM | POA: Diagnosis not present

## 2022-09-06 ENCOUNTER — Other Ambulatory Visit: Payer: Self-pay | Admitting: Internal Medicine

## 2022-09-07 NOTE — Telephone Encounter (Signed)
Requested Prescriptions  Pending Prescriptions Disp Refills  . FLUoxetine (PROZAC) 20 MG capsule [Pharmacy Med Name: FLUoxetine HCl 20 MG Oral Capsule] 90 capsule 0    Sig: TAKE 1 CAPSULE BY MOUTH DAILY     Psychiatry:  Antidepressants - SSRI Passed - 09/06/2022 10:46 PM      Passed - Valid encounter within last 6 months    Recent Outpatient Visits          1 month ago Acute right-sided thoracic back pain   [place] [facility], Jesse Sans, MD   4 months ago Essential (primary) hypertension   [place] [facility] at [facility], Jesse Sans, MD   10 months ago Annual physical exam   [place] [facility] at [facility], Jesse Sans, MD   1 year ago Left arm pain   [place] [facility] at [facility], Jesse Sans, MD   1 year ago Acute cystitis without hematuria   [place] [facility] at [facility], Jesse Sans, MD      Future Appointments            In 2 months [facility], Jesse Sans, MD [place] [facility] at [facility], [facility]

## 2022-09-11 ENCOUNTER — Encounter: Payer: Medicare Other | Admitting: Thoracic Surgery (Cardiothoracic Vascular Surgery)

## 2022-09-18 ENCOUNTER — Institutional Professional Consult (permissible substitution): Payer: Medicare Other | Admitting: Thoracic Surgery (Cardiothoracic Vascular Surgery)

## 2022-09-18 ENCOUNTER — Encounter: Payer: Self-pay | Admitting: Thoracic Surgery (Cardiothoracic Vascular Surgery)

## 2022-09-18 VITALS — BP 117/78 | HR 68 | Resp 18 | Ht 67.0 in | Wt 180.0 lb

## 2022-09-18 DIAGNOSIS — M899 Disorder of bone, unspecified: Secondary | ICD-10-CM

## 2022-09-18 NOTE — Progress Notes (Signed)
PCP is Tracy Hess, MD Referring Provider is Tracy Hess, MD  Chief Complaint  Patient presents with   Consult    MR thoracic 9/27, CTA 9/19    HPI: [name] is sent for consultation regarding a fourth rib mass.  Tracy Chase is a 66 year old woman with a history of renal cell carcinoma status post cryoablation in 2014, arthritis, reflux, anxiety and depression.  She has been having intermittent pain in the right upper back and stabbing pain radiating under the right breast for a couple of years now.  It had gotten worse recently and was pretty much present all the time for couple of months.  She saw Dr. Army Chase.  A CT of the chest showed a 3 x 2.1 x 2 cm expansile lesion of the right fourth rib adjacent to the spine.  MR showed "Indeterminate but probably benign expansile lesion of the posterior right 4th rib.  Although treated metastasis is perhaps possible (remote renal cell carcinoma), favor instead benign etiology such as fibrous dysplasia,enchondroma, eosinophilic granuloma, less likely aneurysmal bone cyst.  As noted she was having nearly constant pain a couple of months ago.  Currently she says she is not having much pain at all.  She is on gabapentin 300 mg nightly, which she has been on since the knee replacement about a year and a half ago.     Past Medical History:  Diagnosis Date   Arthritis    Cancer ([place])    RENAL CELL   Depression    GERD (gastroesophageal reflux disease)    OCC NO MEDS   Insomnia     Past Surgical History:  Procedure Laterality Date   ABDOMINAL HYSTERECTOMY     APPENDECTOMY     BACK SURGERY     "rods in back"   COLONOSCOPY WITH PROPOFOL N/A 08/28/2019   Procedure: COLONOSCOPY WITH PROPOFOL;  Surgeon: Virgel Manifold, MD;  Location: [facility];  Service: Endoscopy;  Laterality: N/A;   cryoablation for renal cell carcinoma  2012   ETHMOIDECTOMY Left 09/06/2020   Procedure: TOTAL ETHMOIDECTOMY;  Surgeon: Clyde Canterbury, MD;   Location: [place];  Service: ENT;  Laterality: Left;   FRONTAL SINUS EXPLORATION Left 09/06/2020   Procedure: FRONTAL SINUS EXPLORATION;  Surgeon: Clyde Canterbury, MD;  Location: [place];  Service: ENT;  Laterality: Left;   IMAGE GUIDED SINUS SURGERY N/A 09/06/2020   Procedure: IMAGE GUIDED SINUS SURGERY;  Surgeon: Clyde Canterbury, MD;  Location: [place];  Service: ENT;  Laterality: N/A;  need [employer] disk PLACE DISK ON OR CHARGE NURSE DESK 10-01 KP   JOINT REPLACEMENT     LUMBAR FUSION     MAXILLARY ANTROSTOMY Left 09/06/2020   Procedure: MAXILLARY ANTROSTOMY WITH TISSUE;  Surgeon: Clyde Canterbury, MD;  Location: [place];  Service: ENT;  Laterality: Left;   MEDIAL PARTIAL KNEE REPLACEMENT Left    PARTIAL HYSTERECTOMY     SPHENOIDECTOMY Left 09/06/2020   Procedure: SPHENOIDECTOMY;  Surgeon: Clyde Canterbury, MD;  Location: [place];  Service: ENT;  Laterality: Left;   TONSILLECTOMY     TOTAL KNEE REVISION Left 02/21/2021   Procedure: Revision, left partial to total knee;  Surgeon: Hessie Knows, MD;  Location: [facility];  Service: Orthopedics;  Laterality: Left;  CHRIS GAINES TO ASSIST   VENTRAL HERNIA REPAIR  2012   Dr. Genevive Bi    Family History  Problem Relation Age of Onset   Kidney failure Mother    Congestive  Heart Failure Father    Breast cancer Neg Hx     Social History Social History   Tobacco Use   Smoking status: Former    Types: Cigarettes    Quit date: 1998    Years since quitting: 25.8   Smokeless tobacco: Never   Tobacco comments:    was "social Smoker" over 15 yrs ago  Vaping Use   Vaping Use: Never used  Substance Use Topics   Alcohol use: Yes    Alcohol/week: 0.0 standard drinks of alcohol    Comment: socially 1-2X/mo   Drug use: No    Current Outpatient Medications  Medication Sig Dispense Refill   docusate sodium (COLACE) 100 MG capsule Take 1 capsule (100 mg total) by mouth 2 (two) times daily. (Patient  taking differently: Take 100 mg by mouth daily as needed.) 10 capsule 0   FLUoxetine (PROZAC) 20 MG capsule TAKE 1 CAPSULE BY MOUTH DAILY 90 capsule 0   gabapentin (NEURONTIN) 300 MG capsule Take 1 capsule by mouth at bedtime.     triamterene-hydrochlorothiazide (MAXZIDE-25) 37.5-25 MG tablet TAKE 1 TABLET BY MOUTH DAILY (Patient taking differently: Take 1 tablet by mouth daily as needed.) 100 tablet 0   zolpidem (AMBIEN) 10 MG tablet Take 1 tablet (10 mg total) by mouth at bedtime. 90 tablet 1   No current facility-administered medications for this visit.    Allergies  Allergen Reactions   Hydrocodone Itching    Review of Systems  Constitutional:  Positive for appetite change and fever ("Hot flashes"). Negative for unexpected weight change.  HENT:  Negative for trouble swallowing and voice change.   Eyes:  Positive for visual disturbance.  Cardiovascular:  Positive for palpitations and leg swelling.  Gastrointestinal:  Positive for abdominal pain (Frequent heartburn), constipation and diarrhea.  Genitourinary:  Negative for difficulty urinating and dysuria.  Musculoskeletal:  Positive for arthralgias and joint swelling.  Neurological:  Positive for dizziness.  Hematological:  Bruises/bleeds easily.  Psychiatric/Behavioral:  Positive for dysphoric mood. The patient is nervous/anxious.   All other systems reviewed and are negative.   BP 117/78 (BP Location: Left Arm, Patient Position: Sitting)   Pulse 68   Resp 18   Ht '5\' 7"'$  (1.702 m)   Wt 180 lb (81.6 kg)   SpO2 98% Comment: RA  BMI 28.19 kg/m  Physical Exam Vitals reviewed.  Constitutional:      General: She is not in acute distress.    Appearance: Normal appearance.  HENT:     Head: Normocephalic and atraumatic.  Eyes:     Extraocular Movements: Extraocular movements intact.     Pupils: Pupils are equal, round, and reactive to light.  Neck:     Vascular: No carotid bruit.  Cardiovascular:     Rate and Rhythm: Normal  rate and regular rhythm.     Pulses: Normal pulses.     Heart sounds: Normal heart sounds. No murmur heard. Pulmonary:     Effort: Pulmonary effort is normal. No respiratory distress.     Breath sounds: Normal breath sounds.  Musculoskeletal:     Cervical back: Neck supple.     Comments: Mild tenderness palpation lower spine at site of previous surgery.  No tenderness of palpation to upper spine or right paraspinal area.  Skin:    General: Skin is warm and dry.  Neurological:     General: No focal deficit present.     Mental Status: She is alert and oriented to person, place, and time.  Cranial Nerves: No cranial nerve deficit.     Motor: No weakness.     Diagnostic Tests: CT ANGIOGRAPHY CHEST WITH CONTRAST   TECHNIQUE: Multidetector CT imaging of the chest was performed using the standard protocol during bolus administration of intravenous contrast. Multiplanar CT image reconstructions and MIPs were obtained to evaluate the vascular anatomy.   RADIATION DOSE REDUCTION: This exam was performed according to the departmental dose-optimization program which includes automated exposure control, adjustment of the mA and/or kV according to patient size and/or use of iterative reconstruction technique.   CONTRAST:  59m OMNIPAQUE IOHEXOL 350 MG/ML SOLN   COMPARISON:  Cardiac CT 11/03/2020.  Chest radiograph 10/18/2015   FINDINGS: Cardiovascular: Minor scarring in the medial right lower lobe. No acute airspace disease. No pleural effusion. There is no pulmonary nodule or mass. There is no central pulmonary embolus on this exam not tailored to pulmonary artery assessment. Normal heart size. No pericardial effusion.   Mediastinum/Nodes: No mediastinal or hilar adenopathy. No mediastinal mass. No visualized thyroid nodule. No esophageal wall thickening.   Lungs/Pleura: Minor medial right lower lobe scarring. No acute airspace disease. Calcified granuloma in the left lower  lobe, benign needing no further follow-up. No noncalcified nodule or pulmonary mass. Minimal pleural thickening and calcification in the lower left hemithorax. No pleural fluid. No endobronchial lesion.   Upper Abdomen: Heterogeneous splenic enhancement likely related to phase of contrast. No acute upper abdominal findings.   Musculoskeletal: There is an expansile lesion involving the perivertebral right fourth rib measuring 3 x 2.1 x 2.0 cm. Margins are sclerotic within narrow zone of transition. There is no periosteal reaction. Lesion approaches the T3-4 disc space. Posterior rod and screw fixation of the thoracolumbar spine, partially included.   Review of the MIP images confirms the above findings.   IMPRESSION: 1. No aortic dissection or acute intrathoracic abnormality. Minimal aortic atherosclerosis. 2. Expansile lesion involving the perivertebral right fourth rib measuring 3 x 2.1 x 2.0 cm. This has a nonaggressive appearance by CT. Retrospectively this may have been present on 2016 chest radiograph, although assessment of this region on radiograph is significantly limited. Given reported right thoracic pain, recommend further assessment with thoracic spine MRI.   These results will be called to the ordering clinician or representative by the Radiologist Assistant, and communication documented in the PACS or [employer]     Electronically Signed   By: MKeith RakeM.D.   On: 07/31/2022 18:52   MRI THORACIC SPINE WITHOUT CONTRAST   TECHNIQUE: Multiplanar, multisequence MR imaging of the thoracic spine was performed. No intravenous contrast was administered.   COMPARISON:  Chest CTA 07/31/2022.   FINDINGS: Limited cervical spine imaging: Straightening of cervical lordosis with evidence of multilevel disc and endplate degeneration.   Thoracic spine segmentation:  Normal on the recent CTA.   Alignment:  Stable thoracic kyphosis.  Mild thoracic  scoliosis.   Vertebrae: Posterior spinal rods from T10 continuing into the upper lumbar spine with abundant metal susceptibility artifact at those levels.   But good bone marrow detail above T9. Fairly circumscribed expansile right posterior rib lesion redemonstrated on series 20, image 11 and series 21, image 11. Stable size and configuration from the recent CT, roughly 3.2 cm long axis. No obvious fluid level by MRI. Internal density was intermediate soft tissue by CT. The lesion has a narrow zone of transition, with no marrow edema at the margins.   Thoracic vertebral marrow signal is normal. No thoracic spine marrow edema  or acute osseous abnormality.   Cord: Spinal cord detail is obscured below T10. But the visible cord signal and morphology are normal, with capacious thoracic spinal canal visible from the cervicothoracic junction to the T10 level.   Paraspinal and other soft tissues: Negative visible thoracic viscera. Posterior thoracic paraspinal soft tissues are within normal limits.   Partially visible small T2 hyperintense areas in the liver dome on series 20, image 30 seem to be enhancing on the recent CTA and may be small hepatic vascular malformations, uncertain. Negative other visible upper abdominal viscera.   Disc levels:   No age advanced thoracic spine degeneration above T10. T10-T11 through cervicothoracic junction levels are obscured by posterior spinal rod artifact.   However, the expansion of the right posterior 4th rib does contribute to moderate to severe right T3 neural foraminal or extraforaminal stenosis at that level.   IMPRESSION: 1. Indeterminate but probably benign expansile lesion of the posterior right 4th rib. Although treated metastasis is perhaps possible (remote renal cell carcinoma), favor instead benign etiology such as fibrous dysplasia, enchondroma, eosinophilic granuloma, less likely aneurysmal bone cyst. CT Chest follow-up  (noncontrast should suffice) can be used to document stability.   2. No direct involvement of the thoracic spine, although the rib expansion does result in stenosis at the exiting right T3 nerve level.   3. Chronic posterior spinal rods obscuring the lower thoracic and thoracolumbar levels below T10. Otherwise negative for age MRI appearance of the thoracic spine.     Electronically Signed   By: Genevie Ann M.D.   On: 08/08/2022 09:39  I personally reviewed the CT images.  There is a 3.0 x 2.1 x 2.0 cm expansile fourth rib mass with sclerotic margins.  Hardware from previous spinal surgery.  Impression: Tracy Chase is a 66 year old woman with a history of renal cell carcinoma status post cryoablation in 2014, arthritis, reflux, anxiety and depression.  She has about a 2-year history of right chest wall pain and intercostal neuralgia.  A couple of months ago that got much more frequent.  Work-up revealed a right fourth rib mass measuring 3 x 2.1 x 2 cm.    Right fourth rib mass -posterior located adjacent to spine.  There is some stenosis at the right T3 nerve level noted on MRI, which would account for her intercostal neuralgia pain.  MRI findings are consistent with benign etiology.  Differential diagnosis as noted in the radiology report includes fibrous dysplasia, endochondroma and eosinophilic granuloma.  I discussed possible surgical resection with Tracy Chase.  With benign radiographic findings is reasonable just to follow this radiographically.  Surgical resection could improve her pain but also could cause other pain that could be as bad or worse than what she is currently experiencing.  She is not having much pain at all currently and is not interested in surgical resection.  Since we only have a single point in time, I think it would be best to get another scan in about 6 months just to make sure there is not rapid growth or other findings that would indicate need for surgical  resection.  We will plan to do that with a CT.  The first option if she were to have more pain would be to increase her gabapentin dosage.  She is only on 300 mg daily.  If that is not effective then she may be willing to consider surgical resection.  Plan: Continue gabapentin Return in 6 months with CT chest.  After that  we can probably go to annual follow-up unless there are changes.  I spent over 30 minutes in review of records, images, and consultation with Tracy Chase today Melrose Nakayama, MD [facility] [phone]

## 2022-09-20 DIAGNOSIS — C44729 Squamous cell carcinoma of skin of left lower limb, including hip: Secondary | ICD-10-CM | POA: Diagnosis not present

## 2022-09-24 DIAGNOSIS — Z85828 Personal history of other malignant neoplasm of skin: Secondary | ICD-10-CM | POA: Diagnosis not present

## 2022-09-24 DIAGNOSIS — D485 Neoplasm of uncertain behavior of skin: Secondary | ICD-10-CM | POA: Diagnosis not present

## 2022-09-24 DIAGNOSIS — L578 Other skin changes due to chronic exposure to nonionizing radiation: Secondary | ICD-10-CM | POA: Diagnosis not present

## 2022-09-24 DIAGNOSIS — L814 Other melanin hyperpigmentation: Secondary | ICD-10-CM | POA: Diagnosis not present

## 2022-09-24 DIAGNOSIS — L821 Other seborrheic keratosis: Secondary | ICD-10-CM | POA: Diagnosis not present

## 2022-09-24 DIAGNOSIS — L570 Actinic keratosis: Secondary | ICD-10-CM | POA: Diagnosis not present

## 2022-10-04 ENCOUNTER — Other Ambulatory Visit: Payer: Self-pay | Admitting: Internal Medicine

## 2022-10-04 DIAGNOSIS — I10 Essential (primary) hypertension: Secondary | ICD-10-CM

## 2022-10-05 NOTE — Telephone Encounter (Signed)
Future visit in 1 month  Requested Prescriptions  Pending Prescriptions Disp Refills   triamterene-hydrochlorothiazide (MAXZIDE-25) 37.5-25 MG tablet [Pharmacy Med Name: Triamterene-HCTZ 37.5-25 MG Oral Tablet] 100 tablet 1    Sig: TAKE 1 TABLET BY MOUTH DAILY     Cardiovascular: Diuretic Combos Passed - 10/04/2022 11:34 PM      Passed - K in normal range and within 180 days    Potassium  Date Value Ref Range Status  07/27/2022 3.8 3.5 - 5.2 mmol/L Final  05/11/2014 3.5 3.5 - 5.1 mmol/L Final         Passed - Na in normal range and within 180 days    Sodium  Date Value Ref Range Status  07/27/2022 141 134 - 144 mmol/L Final  05/11/2014 137 136 - 145 mmol/L Final         Passed - Cr in normal range and within 180 days    Creatinine  Date Value Ref Range Status  05/11/2014 1.02 0.60 - 1.30 mg/dL Final   Creatinine, Ser  Date Value Ref Range Status  07/27/2022 0.69 0.57 - 1.00 mg/dL Final         Passed - Last BP in normal range    BP Readings from Last 1 Encounters:  09/18/22 117/78         Passed - Valid encounter within last 6 months    Recent Outpatient Visits           2 months ago Acute right-sided thoracic back pain   [place] [facility], Jesse Sans, MD   5 months ago Essential (primary) hypertension   [place] [facility] at [facility], Jesse Sans, MD   11 months ago Annual physical exam   [place] [facility] at [facility], Jesse Sans, MD   1 year ago Left arm pain   [place] [facility] at [facility], Jesse Sans, MD   1 year ago Acute cystitis without hematuria   [place] [facility] at [facility], Jesse Sans, MD       Future Appointments             In 1 month Army Melia, Jesse Sans, MD [place] [facility] at [facility], [facility]

## 2022-10-25 ENCOUNTER — Other Ambulatory Visit: Payer: Self-pay

## 2022-10-25 DIAGNOSIS — Z1231 Encounter for screening mammogram for malignant neoplasm of breast: Secondary | ICD-10-CM

## 2022-11-06 ENCOUNTER — Encounter: Payer: Self-pay | Admitting: Internal Medicine

## 2022-11-06 ENCOUNTER — Ambulatory Visit (INDEPENDENT_AMBULATORY_CARE_PROVIDER_SITE_OTHER): Payer: Medicare Other | Admitting: Internal Medicine

## 2022-11-06 VITALS — BP 110/64 | HR 69 | Ht 67.0 in | Wt 188.0 lb

## 2022-11-06 DIAGNOSIS — I10 Essential (primary) hypertension: Secondary | ICD-10-CM | POA: Diagnosis not present

## 2022-11-06 DIAGNOSIS — F5101 Primary insomnia: Secondary | ICD-10-CM

## 2022-11-06 DIAGNOSIS — E782 Mixed hyperlipidemia: Secondary | ICD-10-CM

## 2022-11-06 DIAGNOSIS — M899 Disorder of bone, unspecified: Secondary | ICD-10-CM

## 2022-11-06 DIAGNOSIS — Z9889 Other specified postprocedural states: Secondary | ICD-10-CM | POA: Diagnosis not present

## 2022-11-06 DIAGNOSIS — Z8719 Personal history of other diseases of the digestive system: Secondary | ICD-10-CM

## 2022-11-06 DIAGNOSIS — Z0000 Encounter for general adult medical examination without abnormal findings: Secondary | ICD-10-CM

## 2022-11-06 DIAGNOSIS — F411 Generalized anxiety disorder: Secondary | ICD-10-CM

## 2022-11-06 LAB — POCT URINALYSIS DIPSTICK
Bilirubin, UA: NEGATIVE
Blood, UA: NEGATIVE
Glucose, UA: NEGATIVE
Ketones, UA: POSITIVE
Leukocytes, UA: NEGATIVE
Nitrite, UA: NEGATIVE
Protein, UA: NEGATIVE
Spec Grav, UA: 1.015 (ref 1.010–1.025)
Urobilinogen, UA: 0.2 E.U./dL
pH, UA: 6.5 (ref 5.0–8.0)

## 2022-11-06 MED ORDER — ZOLPIDEM TARTRATE 10 MG PO TABS: 10.0000 mg | ORAL_TABLET | Freq: Every day | ORAL | 1 refills | Status: DC

## 2022-11-06 NOTE — Assessment & Plan Note (Signed)
Clinically stable exam with well controlled BP on diuretics. Tolerating medications without side effects at this time. Pt to continue current regimen and low sodium diet

## 2022-11-06 NOTE — Assessment & Plan Note (Signed)
Currently on diet control Rec continued low fat diet choices

## 2022-11-06 NOTE — Assessment & Plan Note (Signed)
Long standing chronic anxiety  Clinically stable on current regimen with good control of symptoms, No SI or HI. Will continue current therapy.

## 2022-11-06 NOTE — Assessment & Plan Note (Signed)
On Ambien nightly for chronic insomnia No adverse side effects or behaviors noted

## 2022-11-06 NOTE — Assessment & Plan Note (Signed)
Seen by specialist - lesion appeared benign Recommended repeat CT in 6 mo.

## 2022-11-06 NOTE — Progress Notes (Signed)
Date:  11/06/2022   Name:  Tracy Chase   DOB:  05/08/1956   MRN:  [id number]   Chief Complaint: Annual Exam (Breast exam no pap) Tracy Chase is a 66 y.o. female who presents today for her Complete Annual Exam. She feels well. She reports exercising walking daily . She reports she is sleeping well. Breast complaints left breast/underarm soreness.  Mammogram: 11/2021  scheduled 12/2022 DEXA: 09/2019  Normal Pap smear: discontinued Colonoscopy: 08/2019 repeat 5 yrs  Health Maintenance Due  Topic Date Due   DTaP/Tdap/Td (2 - Td or Tdap) 04/29/2022    Immunization History  Administered Date(s) Administered   Fluad Quad(high Dose 65+) 09/02/2019, 07/27/2022   Influenza Inj Mdck Quad Pf 08/18/2018   Influenza,inj,Quad PF,6+ Mos 10/02/2016, 09/03/2017   Influenza-Unspecified 08/26/2020, 08/12/2021   PFIZER(Purple Top)SARS-COV-2 Vaccination 02/08/2020, 03/01/2020, 09/30/2020   Pfizer Covid-19 Vaccine Bivalent Booster 23yr & up 05/16/2021   Pneumococcal Conjugate-13 09/23/2019   Pneumococcal Polysaccharide-23 10/12/2020   Tdap 04/29/2012   Zoster Recombinat (Shingrix) 08/13/2021, 11/20/2021    Hypertension This is a chronic problem. Associated symptoms include anxiety. Pertinent negatives include no chest pain, headaches, palpitations or shortness of breath. Past treatments include diuretics.  Insomnia Primary symptoms: sleep disturbance.    Anxiety Symptoms include insomnia and nervous/anxious behavior. Patient reports no chest pain, dizziness, palpitations or shortness of breath.    Back Pain This is a chronic problem. The pain is present in the lumbar spine. Associated symptoms include abdominal pain (and fullness epigastrically). Pertinent negatives include no chest pain, dysuria, fever or headaches. Treatments tried: gabapentin, pain management.    Lab Results  Component Value Date   NA 141 07/27/2022   K 3.8 07/27/2022   CO2 26 07/27/2022   GLUCOSE 93 07/27/2022    BUN 7 (L) 07/27/2022   CREATININE 0.69 07/27/2022   CALCIUM 9.3 07/27/2022   EGFR 94 07/27/2022   GFRNONAA >60 02/22/2021   Lab Results  Component Value Date   CHOL 235 (H) 11/01/2021   HDL 83 11/01/2021   LDLCALC 140 (H) 11/01/2021   TRIG 68 11/01/2021   CHOLHDL 2.8 11/01/2021   Lab Results  Component Value Date   TSH 1.630 08/11/2021   Lab Results  Component Value Date   HGBA1C 5.3 11/01/2021   Lab Results  Component Value Date   WBC 5.3 07/27/2022   HGB 12.2 07/27/2022   HCT 36.5 07/27/2022   MCV 90 07/27/2022   PLT 200 07/27/2022   Lab Results  Component Value Date   ALT 11 07/27/2022   AST 10 07/27/2022   ALKPHOS 57 07/27/2022   BILITOT 0.3 07/27/2022   No results found for: "25OHVITD2", "25OHVITD3", "VD25OH"   Review of Systems  Constitutional:  Negative for chills, fatigue and fever.  HENT:  Negative for congestion, hearing loss, tinnitus, trouble swallowing and voice change.   Eyes:  Negative for visual disturbance.  Respiratory:  Negative for cough, chest tightness, shortness of breath and wheezing.   Cardiovascular:  Negative for chest pain, palpitations and leg swelling.  Gastrointestinal:  Positive for abdominal pain (and fullness epigastrically). Negative for constipation, diarrhea and vomiting.  Endocrine: Negative for polydipsia and polyuria.  Genitourinary: Negative.  Negative for dysuria, frequency, genital sores, vaginal bleeding and vaginal discharge.  Musculoskeletal:  Positive for back pain. Negative for arthralgias, gait problem and joint swelling.  Skin:  Negative for color change and rash.  Neurological:  Negative for dizziness, tremors, light-headedness and headaches.  Hematological:  Negative for adenopathy. Does not bruise/bleed easily.  Psychiatric/Behavioral:  Positive for sleep disturbance. Negative for dysphoric mood. The patient is nervous/anxious and has insomnia.     Patient Active Problem List   Diagnosis Date Noted   Rib  lesion 08/09/2022   Trigger index finger of right hand 11/01/2021   Atherosclerosis of aorta ([place]) 10/17/2021   S/P TKR (total knee replacement) using cement, left 02/21/2021   Mixed hyperlipidemia 09/25/2019   Encounter for screening colonoscopy    Polyp of colon    Degenerative disc disease, cervical 11/18/2017   Generalized anxiety disorder 06/21/2015   History of renal cell cancer 06/21/2015   Carpal tunnel syndrome 06/21/2015   Essential (primary) hypertension 06/21/2015   Idiopathic insomnia 06/21/2015    Allergies  Allergen Reactions   Hydrocodone Itching    Past Surgical History:  Procedure Laterality Date   ABDOMINAL HYSTERECTOMY     APPENDECTOMY     BACK SURGERY     "rods in back"   COLONOSCOPY WITH PROPOFOL N/A 08/28/2019   Procedure: COLONOSCOPY WITH PROPOFOL;  Surgeon: Virgel Manifold, MD;  Location: [facility];  Service: Endoscopy;  Laterality: N/A;   cryoablation for renal cell carcinoma  2012   ETHMOIDECTOMY Left 09/06/2020   Procedure: TOTAL ETHMOIDECTOMY;  Surgeon: Clyde Canterbury, MD;  Location: [place];  Service: ENT;  Laterality: Left;   FRONTAL SINUS EXPLORATION Left 09/06/2020   Procedure: FRONTAL SINUS EXPLORATION;  Surgeon: Clyde Canterbury, MD;  Location: [place];  Service: ENT;  Laterality: Left;   IMAGE GUIDED SINUS SURGERY N/A 09/06/2020   Procedure: IMAGE GUIDED SINUS SURGERY;  Surgeon: Clyde Canterbury, MD;  Location: [place];  Service: ENT;  Laterality: N/A;  need stryker disk PLACE DISK ON OR CHARGE NURSE DESK 10-01 KP   JOINT REPLACEMENT     LUMBAR FUSION     MAXILLARY ANTROSTOMY Left 09/06/2020   Procedure: MAXILLARY ANTROSTOMY WITH TISSUE;  Surgeon: Clyde Canterbury, MD;  Location: [place];  Service: ENT;  Laterality: Left;   MEDIAL PARTIAL KNEE REPLACEMENT Left    PARTIAL HYSTERECTOMY     SPHENOIDECTOMY Left 09/06/2020   Procedure: SPHENOIDECTOMY;  Surgeon: Clyde Canterbury, MD;  Location:  [place];  Service: ENT;  Laterality: Left;   TONSILLECTOMY     TOTAL KNEE REVISION Left 02/21/2021   Procedure: Revision, left partial to total knee;  Surgeon: Hessie Knows, MD;  Location: [facility];  Service: Orthopedics;  Laterality: Left;  CHRIS GAINES TO ASSIST   VENTRAL HERNIA REPAIR  2012   Dr. Genevive Bi    Social History   Tobacco Use   Smoking status: Former    Types: Cigarettes    Quit date: 1998    Years since quitting: 26.0   Smokeless tobacco: Never   Tobacco comments:    was "social Smoker" over 15 yrs ago  Vaping Use   Vaping Use: Never used  Substance Use Topics   Alcohol use: Yes    Alcohol/week: 0.0 standard drinks of alcohol    Comment: socially 1-2X/mo   Drug use: No     Medication list has been reviewed and updated.  Current Meds  Medication Sig   docusate sodium (COLACE) 100 MG capsule Take 1 capsule (100 mg total) by mouth 2 (two) times daily. (Patient taking differently: Take 100 mg by mouth daily as needed.)   FLUoxetine (PROZAC) 20 MG capsule TAKE 1 CAPSULE BY MOUTH DAILY   gabapentin (NEURONTIN) 300 MG capsule Take 1 capsule  by mouth at bedtime.   triamterene-hydrochlorothiazide (MAXZIDE-25) 37.5-25 MG tablet TAKE 1 TABLET BY MOUTH DAILY   [DISCONTINUED] zolpidem (AMBIEN) 10 MG tablet Take 1 tablet (10 mg total) by mouth at bedtime.       11/06/2022   10:05 AM 07/27/2022    3:37 PM 04/26/2022    9:52 AM 11/01/2021    9:58 AM  GAD 7 : Generalized Anxiety Score  Nervous, Anxious, on Edge 1 2 0 1  Control/stop worrying _0 Worry too much - different things _1 Trouble relaxing _2 Restless _3 0  Easily annoyed or irritable 0 0 0 2  Afraid - awful might happen 2 0 2 2  Total GAD 7 Score _4 Anxiety Difficulty Not difficult at all Not difficult at all Not difficult at all Not difficult at all       11/06/2022   10:05 AM 07/27/2022    3:37 PM 07/13/2022    8:53 AM  Depression screen PHQ 2/9  Decreased  Interest 0 0 0  Down, Depressed, Hopeless 1 0 0  PHQ - 2 Score 1 0 0  Altered sleeping 0 0   Tired, decreased energy 0 2   Change in appetite 0 2   Feeling bad or failure about yourself  0 0   Trouble concentrating 0 0   Moving slowly or fidgety/restless 0 0   Suicidal thoughts 0 0   PHQ-9 Score 1 4   Difficult doing work/chores Not difficult at all Not difficult at all     BP Readings from Last 3 Encounters:  11/06/22 110/64  09/18/22 117/78  07/27/22 124/72    Physical Exam Vitals and nursing note reviewed.  Constitutional:      General: She is not in acute distress.    Appearance: She is well-developed.  HENT:     Head: Normocephalic and atraumatic.     Right Ear: Tympanic membrane and ear canal normal.     Left Ear: Tympanic membrane and ear canal normal.     Nose:     Right Sinus: No maxillary sinus tenderness.     Left Sinus: No maxillary sinus tenderness.  Eyes:     General: No scleral icterus.       Right eye: No discharge.        Left eye: No discharge.     Conjunctiva/sclera: Conjunctivae normal.  Neck:     Thyroid: No thyromegaly.     Vascular: No carotid bruit.  Cardiovascular:     Rate and Rhythm: Normal rate and regular rhythm.     Pulses: Normal pulses.     Heart sounds: Normal heart sounds.  Pulmonary:     Effort: Pulmonary effort is normal. No respiratory distress.     Breath sounds: No wheezing.  Chest:  Breasts:    Right: No mass, nipple discharge, skin change or tenderness.     Left: No mass, nipple discharge, skin change or tenderness.  Abdominal:     General: Abdomen is flat. Bowel sounds are normal.     Palpations: Abdomen is soft.     Tenderness: There is abdominal tenderness in the epigastric area.     Comments: No definite hernia but there is tenderness epigastrically with palpation  Musculoskeletal:     Cervical back: Normal range of motion. No erythema.     Right lower leg: No edema.     Left  lower leg: No edema.   Lymphadenopathy:     Cervical: No cervical adenopathy.  Skin:    General: Skin is warm and dry.     Findings: No rash.  Neurological:     Mental Status: She is alert and oriented to person, place, and time.     Cranial Nerves: No cranial nerve deficit.     Sensory: No sensory deficit.     Deep Tendon Reflexes: Reflexes are normal and symmetric.  Psychiatric:        Attention and Perception: Attention normal.        Mood and Affect: Mood normal.     Wt Readings from Last 3 Encounters:  11/06/22 188 lb (85.3 kg)  09/18/22 180 lb (81.6 kg)  07/27/22 187 lb (84.8 kg)    BP 110/64   Pulse 69   Ht _0  (1.702 m)   Wt 188 lb (85.3 kg)   SpO2 98%   BMI 29.44 kg/m   Assessment and Plan: Problem List Items Addressed This Visit       Cardiovascular and Mediastinum   Essential (primary) hypertension (Chronic)    Clinically stable exam with well controlled BP on diuretics. Tolerating medications without side effects at this time. Pt to continue current regimen and low sodium diet      Relevant Orders   CBC with Differential/Platelet   Comprehensive metabolic panel   POCT urinalysis dipstick     Other   Generalized anxiety disorder (Chronic)    Long standing chronic anxiety  Clinically stable on current regimen with good control of symptoms, No SI or HI. Will continue current therapy.       Relevant Orders   TSH   Idiopathic insomnia (Chronic)    On Ambien nightly for chronic insomnia No adverse side effects or behaviors noted      Relevant Medications   zolpidem (AMBIEN) 10 MG tablet   Mixed hyperlipidemia (Chronic)    Currently on diet control Rec continued low fat diet choices       Relevant Orders   Lipid panel   Rib lesion (Chronic)    Seen by specialist - lesion appeared benign Recommended repeat CT in 6 mo.      Other Visit Diagnoses     Annual physical exam    -  Primary   up to date on immunizations mammogram scheduled Tetanus needs  updating if she sustains an injury   History of ventral hernia repair       possible recurrent hernia   Relevant Orders   Ambulatory referral to General Surgery        Partially dictated using Dragon software. Any errors are unintentional.  Halina Maidens, MD [facility]  11/06/2022

## 2022-11-07 LAB — COMPREHENSIVE METABOLIC PANEL
ALT: 16 IU/L (ref 0–32)
AST: 19 IU/L (ref 0–40)
Albumin/Globulin Ratio: 2 (ref 1.2–2.2)
Albumin: 4.3 g/dL (ref 3.9–4.9)
Alkaline Phosphatase: 62 IU/L (ref 44–121)
BUN/Creatinine Ratio: 14 (ref 12–28)
BUN: 11 mg/dL (ref 8–27)
Bilirubin Total: 0.4 mg/dL (ref 0.0–1.2)
CO2: 23 mmol/L (ref 20–29)
Calcium: 9.5 mg/dL (ref 8.7–10.3)
Chloride: 100 mmol/L (ref 96–106)
Creatinine, Ser: 0.81 mg/dL (ref 0.57–1.00)
Globulin, Total: 2.1 g/dL (ref 1.5–4.5)
Glucose: 93 mg/dL (ref 70–99)
Potassium: 4.2 mmol/L (ref 3.5–5.2)
Sodium: 142 mmol/L (ref 134–144)
Total Protein: 6.4 g/dL (ref 6.0–8.5)
eGFR: 79 mL/min/{1.73_m2} (ref >59)

## 2022-11-07 LAB — CBC WITH DIFFERENTIAL/PLATELET
Basophils Absolute: 0 10*3/uL (ref 0.0–0.2)
Basos: 1 %
EOS (ABSOLUTE): 0.1 10*3/uL (ref 0.0–0.4)
Eos: 2 %
Hematocrit: 40.7 % (ref 34.0–46.6)
Hemoglobin: 13.4 g/dL (ref 11.1–15.9)
Immature Grans (Abs): 0 10*3/uL (ref 0.0–0.1)
Immature Granulocytes: 0 %
Lymphocytes Absolute: 2.2 10*3/uL (ref 0.7–3.1)
Lymphs: 47 %
MCH: 30.1 pg (ref 26.6–33.0)
MCHC: 32.9 g/dL (ref 31.5–35.7)
MCV: 92 fL (ref 79–97)
Monocytes Absolute: 0.4 10*3/uL (ref 0.1–0.9)
Monocytes: 8 %
Neutrophils Absolute: 2 10*3/uL (ref 1.4–7.0)
Neutrophils: 42 %
Platelets: 208 10*3/uL (ref 150–450)
RBC: 4.45 x10E6/uL (ref 3.77–5.28)
RDW: 11 % — ABNORMAL LOW (ref 11.7–15.4)
WBC: 4.7 10*3/uL (ref 3.4–10.8)

## 2022-11-07 LAB — LIPID PANEL
Chol/HDL Ratio: 2.3 ratio (ref 0.0–4.4)
Cholesterol, Total: 219 mg/dL — ABNORMAL HIGH (ref 100–199)
HDL: 95 mg/dL (ref >39)
LDL Chol Calc (NIH): 112 mg/dL — ABNORMAL HIGH (ref 0–99)
Triglycerides: 66 mg/dL (ref 0–149)
VLDL Cholesterol Cal: 12 mg/dL (ref 5–40)

## 2022-11-07 LAB — TSH: TSH: 1.62 u[IU]/mL (ref 0.450–4.500)

## 2022-11-08 ENCOUNTER — Other Ambulatory Visit: Payer: Self-pay | Admitting: Internal Medicine

## 2022-11-19 ENCOUNTER — Encounter: Payer: Self-pay | Admitting: Surgery

## 2022-11-19 ENCOUNTER — Ambulatory Visit: Payer: Medicare Other | Admitting: Surgery

## 2022-11-19 VITALS — BP 121/80 | HR 76 | Temp 97.9°F | Ht 66.0 in | Wt 190.6 lb

## 2022-11-19 DIAGNOSIS — R1010 Upper abdominal pain, unspecified: Secondary | ICD-10-CM | POA: Diagnosis not present

## 2022-11-19 DIAGNOSIS — R1900 Intra-abdominal and pelvic swelling, mass and lump, unspecified site: Secondary | ICD-10-CM

## 2022-11-19 NOTE — Patient Instructions (Addendum)
Call Centralized Scheduling at [phone] to reschedule your CT.  If you have any concerns or questions, please feel free to call our office. See follow up appointment below.   Ventral Hernia  A ventral hernia is a bulge of tissue from inside the abdomen that pushes through a weak area of the muscles that form the front wall of the abdomen. The tissues inside the abdomen are inside a sac (peritoneum). These tissues include the small intestine, large intestine, and the fatty tissue that covers the intestines (omentum). Sometimes, the bulge that forms a hernia contains intestines. Other hernias contain only fat. Ventral hernias do not go away without surgical treatment. There are several types of ventral hernias. You may have: A hernia at an incision site from previous abdominal surgery (incisional hernia). A hernia just above the belly button (epigastric hernia), or at the belly button (umbilical hernia). These types of hernias can develop from heavy lifting or straining. A hernia that comes and goes (reducible hernia). It may be visible only when you lift or strain. This type of hernia can be pushed back into the abdomen (reduced). A hernia that traps abdominal tissue inside the hernia (incarcerated hernia). This type of hernia does not reduce. A hernia that cuts off blood flow to the tissues inside the hernia (strangulated hernia). The tissues can start to die if this happens. This is a very painful bulge that cannot be reduced. A strangulated hernia is a medical emergency. What are the causes? This condition is caused by abdominal tissue putting pressure on an area of weakness in the abdominal muscles. What increases the risk? The following factors may make you more likely to develop this condition: Being age 13 or older. Being overweight or obese. Having had previous abdominal surgery, especially if there was an infection after surgery. Having had an injury to the abdominal  wall. Frequently lifting or pushing heavy objects. Having had several pregnancies. Having a buildup of fluid inside the abdomen (ascites). Straining to have a bowel movement or to urinate. Having frequent coughing episodes. What are the signs or symptoms? The only symptom of a ventral hernia may be a painless bulge in the abdomen. A reducible hernia may be visible only when you strain, cough, or lift. Other symptoms may include: Dull pain. A feeling of pressure. Signs and symptoms of a strangulated hernia may include: Increasing pain. Nausea and vomiting. Pain when pressing on the hernia. The skin over the hernia turning red or purple. Constipation. Blood in the stool (feces). How is this diagnosed? This condition may be diagnosed based on: Your symptoms. Your medical history. A physical exam. You may be asked to cough or strain while standing. These actions increase the pressure inside your abdomen and force the hernia through the opening in your muscles. Your health care provider may try to reduce the hernia by gently pushing the hernia back in. Imaging studies, such as an ultrasound or CT scan. How is this treated? This condition is treated with surgery. If you have a strangulated hernia, surgery is done as soon as possible. If your hernia is small and not incarcerated, you may be asked to lose some weight before surgery. Follow these instructions at home: Follow instructions from your health care provider about eating or drinking restrictions. If you are overweight, your health care provider may recommend that you increase your activity level and eat a healthier diet. Do not lift anything that is heavier than 10 lb (4.5 kg), or the limit that you  are told, until your health care provider says that it is safe. Return to your normal activities as told by your health care provider. Ask your health care provider what activities are safe for you. You may need to avoid activities that  increase pressure on your hernia. Take over-the-counter and prescription medicines only as told by your health care provider. Keep all follow-up visits. This is important. Contact a health care provider if: Your hernia gets larger. Your hernia becomes painful. Get help right away if: Your hernia becomes increasingly painful. You have pain along with any of the following: Changes in skin color in the area of the hernia. Nausea. Vomiting. Fever. These symptoms may represent a serious problem that is an emergency. Do not wait to see if the symptoms will go away. Get medical help right away. Call your local emergency services (911 in the [place] Do not drive yourself to the hospital. Summary A ventral hernia is a bulge of tissue from inside the abdomen that pushes through a weak area of the muscles that form the front wall of the abdomen. This condition is treated with surgery, which may be urgent depending on your hernia. Do not lift anything that is heavier than 10 lb (4.5 kg), and follow activity instructions from your health care provider. This information is not intended to replace advice given to you by your health care provider. Make sure you discuss any questions you have with your health care provider. Document Revised: 06/17/2020 Document Reviewed: 06/17/2020 Elsevier Patient Education  [place].

## 2022-11-19 NOTE — Progress Notes (Signed)
11/19/2022  Reason for Visit:  Abdominal pain and bulging  Requesting Provider:  Halina Maidens, MD  History of Present Illness: Tracy Chase is a 67 y.o. female presenting for evaluation of abdominal pain and bulging.  The patient has a significant surgical history.  She reports she had perforated appendicitis with sepsis and required exploratory laparotomy in 2011.  She had a subsequent hernia in the incision and required hernia repair with mesh in 2012.  She reports that at the time her umbilicus was excised due to concern for ischemia/infection.  She's also had an abdominal hysterectomy and c-section x 2.  She reports that over the past few months she started having abdominal pain in the epigastric region extending to the mid portion of her midline incision.  The pain does not appear to be related to po intake, but may be related to activity level or may be random at times.  The pain may radiate toward the right side, but no other areas of radiation.  Denies any constipation or diarrhea and takes a stool softener.  Has some nausea but no emesis.  Denies any fevers, chills, chest pain, shortness of breath.  She has not had any abdominal imaging in a few years.  Past Medical History: Past Medical History:  Diagnosis Date   Arthritis    Cancer ([place])    RENAL CELL   Depression    GERD (gastroesophageal reflux disease)    OCC NO MEDS   Insomnia      Past Surgical History: Past Surgical History:  Procedure Laterality Date   ABDOMINAL HYSTERECTOMY     APPENDECTOMY     BACK SURGERY     "rods in back"   COLONOSCOPY WITH PROPOFOL N/A 08/28/2019   Procedure: COLONOSCOPY WITH PROPOFOL;  Surgeon: Virgel Manifold, MD;  Location: [facility];  Service: Endoscopy;  Laterality: N/A;   cryoablation for renal cell carcinoma  2012   ETHMOIDECTOMY Left 09/06/2020   Procedure: TOTAL ETHMOIDECTOMY;  Surgeon: Clyde Canterbury, MD;  Location: [place];  Service: ENT;  Laterality: Left;    FRONTAL SINUS EXPLORATION Left 09/06/2020   Procedure: FRONTAL SINUS EXPLORATION;  Surgeon: Clyde Canterbury, MD;  Location: [place];  Service: ENT;  Laterality: Left;   IMAGE GUIDED SINUS SURGERY N/A 09/06/2020   Procedure: IMAGE GUIDED SINUS SURGERY;  Surgeon: Clyde Canterbury, MD;  Location: [place];  Service: ENT;  Laterality: N/A;  need stryker disk PLACE DISK ON OR CHARGE NURSE DESK 10-01 KP   JOINT REPLACEMENT     LUMBAR FUSION     MAXILLARY ANTROSTOMY Left 09/06/2020   Procedure: MAXILLARY ANTROSTOMY WITH TISSUE;  Surgeon: Clyde Canterbury, MD;  Location: [place];  Service: ENT;  Laterality: Left;   MEDIAL PARTIAL KNEE REPLACEMENT Left    PARTIAL HYSTERECTOMY     SPHENOIDECTOMY Left 09/06/2020   Procedure: SPHENOIDECTOMY;  Surgeon: Clyde Canterbury, MD;  Location: [place];  Service: ENT;  Laterality: Left;   TONSILLECTOMY     TOTAL KNEE REVISION Left 02/21/2021   Procedure: Revision, left partial to total knee;  Surgeon: Hessie Knows, MD;  Location: [facility];  Service: Orthopedics;  Laterality: Left;  CHRIS GAINES TO ASSIST   VENTRAL HERNIA REPAIR  2012   Dr. Genevive Bi    Home Medications: Prior to Admission medications   Medication Sig Start Date End Date Taking? Authorizing Provider  docusate sodium (COLACE) 100 MG capsule Take 1 capsule (100 mg total) by mouth 2 (two) times daily.  Patient taking differently: Take 100 mg by mouth daily as needed. 02/23/21  Yes Duanne Guess, PA-C  FLUoxetine (PROZAC) 20 MG capsule TAKE 1 CAPSULE BY MOUTH DAILY 11/09/22  Yes Glean Hess, MD  gabapentin (NEURONTIN) 300 MG capsule Take 1 capsule by mouth at bedtime. 04/23/22  Yes [provider]  triamterene-hydrochlorothiazide (MAXZIDE-25) 37.5-25 MG tablet TAKE 1 TABLET BY MOUTH DAILY 10/05/22  Yes Glean Hess, MD  zolpidem (AMBIEN) 10 MG tablet Take 1 tablet (10 mg total) by mouth at bedtime. 11/06/22  Yes Glean Hess, MD     Allergies: Allergies  Allergen Reactions   Hydrocodone Itching    Social History:  reports that she quit smoking about 26 years ago. Her smoking use included cigarettes. She has never used smokeless tobacco. She reports current alcohol use. She reports that she does not use drugs.   Family History: Family History  Problem Relation Age of Onset   Kidney failure Mother    Congestive Heart Failure Father    Breast cancer Neg Hx     Review of Systems: Review of Systems  Constitutional:  Negative for chills and fever.  HENT:  Negative for hearing loss.   Respiratory:  Negative for shortness of breath.   Cardiovascular:  Negative for chest pain.  Gastrointestinal:  Positive for abdominal pain and nausea. Negative for constipation, diarrhea and vomiting.  Genitourinary:  Negative for dysuria.  Musculoskeletal:  Negative for myalgias.  Skin:  Negative for rash.  Neurological:  Negative for dizziness.  Psychiatric/Behavioral:  Negative for depression.     Physical Exam BP 121/80   Pulse 76   Temp 97.9 F (36.6 C) (Oral)   Ht '5\' 6"'$  (1.676 m)   Wt 190 lb 9.6 oz (86.5 kg)   SpO2 94%   BMI 30.76 kg/m  CONSTITUTIONAL: No acute distress, well nourished. HEENT:  Normocephalic, atraumatic, extraocular motion intact. NECK: Trachea is midline, and there is no jugular venous distension.  RESPIRATORY:  Lungs are clear, and breath sounds are equal bilaterally. Normal respiratory effort without pathologic use of accessory muscles. CARDIOVASCULAR: Heart is regular without murmurs, gallops, or rubs. GI: The abdomen is soft, non-distended, with tenderness in epigastric region, centered mostly around the mid portion of her midline incision.  There's an area of central bulging but it's not clear that this is a hernia defect vs diastasis or weakness.   MUSCULOSKELETAL:  Normal muscle strength and tone in all four extremities.  No peripheral edema or cyanosis. SKIN: Skin turgor is normal.  There are no pathologic skin lesions.  NEUROLOGIC:  Motor and sensation is grossly normal.  Cranial nerves are grossly intact. PSYCH:  Alert and oriented to person, place and time. Affect is normal.  Laboratory Analysis: Labs from 11/06/22: Na 142, K 4.2, Cl 100, CO2 23, BUN 11, Cr 0.81.  LFTs within normal.  WBC 4.7, Hgb 13.4, Hct 40.7, Plt 208.  Imaging: No results found.  Assessment and Plan: This is a 67 y.o. female with abdominal pain and possible hernia  --Discussed with the patient that it is unclear if she has a new hernia defect vs an area of weakness or diastasis.  She has had multiple surgeries in the past and has had a prior ventral hernia repair with mesh in 2012.  Discussed with her that it would be better to obtain a new CT scan of abdomen/pelvis to better evaluate the anatomy, see if she truly has a new hernia.  This would help [place]  better in preop planning. --Patient will follow up with me after CT scan is done to discuss results and possibly schedule surgery if needed. --For now, discussed activity restrictions with no heavy lifting/pushing.  I spent 40 minutes dedicated to the care of this patient on the date of this encounter to include pre-visit review of records, face-to-face time with the patient discussing diagnosis and management, and any post-visit coordination of care.   Melvyn Neth, [facility]

## 2022-11-21 ENCOUNTER — Ambulatory Visit
Admission: RE | Admit: 2022-11-21 | Discharge: 2022-11-21 | Disposition: A | Discharge status: Home / Self Care | Payer: Medicare Other | Source: Ambulatory Visit | Attending: Surgery | Admitting: Surgery

## 2022-11-21 DIAGNOSIS — R1010 Upper abdominal pain, unspecified: Secondary | ICD-10-CM

## 2022-11-21 DIAGNOSIS — R109 Unspecified abdominal pain: Secondary | ICD-10-CM | POA: Diagnosis not present

## 2022-11-21 DIAGNOSIS — I700 Atherosclerosis of aorta: Secondary | ICD-10-CM | POA: Diagnosis not present

## 2022-11-21 MED ORDER — IOHEXOL 300 MG/ML  SOLN
100.0000 mL | Freq: Once | INTRAMUSCULAR | Status: AC | PRN
  Administered 2022-11-21: 100 mL via INTRAVENOUS

## 2022-11-22 ENCOUNTER — Other Ambulatory Visit (HOSPITAL_COMMUNITY): Payer: Medicare Other

## 2022-12-10 ENCOUNTER — Telehealth: Payer: Self-pay

## 2022-12-10 ENCOUNTER — Encounter: Payer: Self-pay | Admitting: Surgery

## 2022-12-10 ENCOUNTER — Ambulatory Visit: Payer: Medicare Other | Admitting: Surgery

## 2022-12-10 VITALS — BP 131/83 | HR 66 | Temp 97.7°F | Ht 66.0 in | Wt 187.2 lb

## 2022-12-10 DIAGNOSIS — K432 Incisional hernia without obstruction or gangrene: Secondary | ICD-10-CM

## 2022-12-10 NOTE — Patient Instructions (Signed)
Our surgery scheduler Pamala Hurry will call you within 24-48 hours to get you scheduled. If you have not heard from her after 48 hours, please call our office. Have the blue sheet available when she calls to write down important information.   If you have any concerns or questions, please feel free to call our office.   Laparoscopic Ventral Hernia Repair Laparoscopic ventral hernia repairis a procedure to fix a bulge of tissue that pushes through a weak area of muscle in the abdomen (ventral hernia). A ventral hernia may be: Above the belly button. This is called an epigastric hernia. At the belly button. This is called an umbilical hernia. At the incision site from previous abdominal surgery. This is called an incisional hernia. You may have this procedure as emergency surgery if part of your intestine gets trapped inside the hernia and starts to lose its blood supply (strangulation). Laparoscopic surgery is done through small incisions using a thin surgical telescope with a light and camera (laparoscope). During surgery, your surgeon will use images from the laparoscope to guide the procedure. A mesh screen will be placed in the hernia to close the opening and strengthen the abdominal wall. Tell a health care provider about: Any allergies you have. All medicines you are taking, including vitamins, herbs, eye drops, creams, and over-the-counter medicines. Any problems you or family members have had with anesthetic medicines. Any blood disorders you have. Any surgeries you have had. Any medical conditions you have. Whether you are pregnant or may be pregnant. What are the risks? Generally, this is a safe procedure. However, problems may occur, including: Infection. Bleeding. Damage to nearby structures or organs in the abdomen. Trouble urinating or having a bowel movement after surgery. Blood clots. The hernia coming back after surgery. Fluid buildup in the area of the hernia. In some cases,  your health care provider may need to switch from a laparoscopic procedure to a procedure that is done through a single, larger incision in the abdomen (open procedure). You may need an open procedure if: You have a hernia that is difficult to repair. Your organs are hard to see with the laparoscope. You have bleeding problems during the laparoscopic procedure. What happens before the procedure? Staying hydrated Follow instructions from your health care provider about hydration, which may include: Up to 2 hours before the procedure - you may continue to drink clear liquids, such as water, clear fruit juice, black coffee, and plain tea.  Eating and drinking restrictions Follow instructions from your health care provider about eating and drinking, which may include: 8 hours before the procedure - stop eating heavy meals or foods, such as meat, fried foods, or fatty foods. 6 hours before the procedure - stop eating light meals or foods, such as toast or cereal. 6 hours before the procedure - stop drinking milk or drinks that contain milk. 2 hours before the procedure - stop drinking clear liquids. Medicines Ask your health care provider about: Changing or stopping your regular medicines. This is especially important if you are taking diabetes medicines or blood thinners. Taking medicines such as aspirin and ibuprofen. These medicines can thin your blood. Do not take these medicines unless your health care provider tells you to take them. Taking over-the-counter medicines, vitamins, herbs, and supplements. Tests You may need to have tests before the procedure, such as: Blood tests. Urine tests. Abdominal ultrasound. Chest X-ray. Electrocardiogram (ECG). General instructions You may be asked to take a laxative or do an enema to  empty your bowel before surgery (bowel prep). Do not use any products that contain nicotine or tobacco for at least 4 weeks before the procedure. These products  include cigarettes, chewing tobacco, and vaping devices, such as e-cigarettes. If you need help quitting, ask your health care provider. Ask your health care provider: How your surgery site will be marked. What steps will be taken to help prevent infection. These steps may include: Removing hair at the surgery site. Washing skin with a germ-killing soap. Receiving antibiotic medicine. Plan to have a responsible adult take you home from the hospital or clinic. If you will be going home right after the procedure, plan to have a responsible adult care for you for the time you are told. This is important. What happens during the procedure?  An IV will be inserted into one of your veins. You will be given one or more of the following: A medicine to help you relax (sedative). A medicine to numb the area (local anesthetic). A medicine to make you fall asleep (general anesthetic). A small incision will be made in your abdomen. A hollow metal tube (trocar) will be placed through the incision. A tube will be placed through the trocar to inflate your abdomen with carbon dioxide. This makes it easier for your surgeon to see inside your abdomen during the repair. A laparoscope will be inserted into your abdomen through the trocar. The laparoscope will send images to a monitor in the operating room. Other trocars will be put through other small incisions in your abdomen. The surgical instruments needed for the procedure will be placed through these trocars. The tissue or intestines that make up the hernia will be moved back into place. The edges of the hernia may be stitched (sutured) together. A piece of mesh will be used to close the hernia. Sutures, clips, or staples will be used to keep the mesh in place. A bandage (dressing) or skin glue will be put over the incisions. The procedure may vary among health care providers and hospitals. What happens after the procedure? Your blood pressure, heart rate,  breathing rate, and blood oxygen level will be monitored until you leave the hospital or clinic. You will continue to receive fluids and medicines through an IV. Your IV will be removed when you can drink clear fluids. You will be given pain medicine as needed. You will be encouraged to get up and walk around as soon as possible. You will be shown how to do deep breathing exercises to help prevent a lung infection. If you were given a sedative during the procedure, it can affect you for several hours. Do not drive or operate machinery until your health care provider says that it is safe. Summary Laparoscopic ventral hernia is an operation to fix a hernia using small incisions. Tell your health care provider about other medical conditions that you have and about all the medicines that you are taking. Follow instructions from your health care provider about eating and drinking before the procedure. Plan to have a responsible adult take you home from the hospital or clinic. After the procedure, you will be encouraged to walk as soon as possible. You will also be taught how to do deep breathing exercises. This information is not intended to replace advice given to you by your health care provider. Make sure you discuss any questions you have with your health care provider. Document Revised: 06/17/2020 Document Reviewed: 06/17/2020 Elsevier Patient Education  [place].

## 2022-12-10 NOTE — Telephone Encounter (Signed)
Faxed medical clearance to Dr. Halina Maidens at [phone].

## 2022-12-10 NOTE — Progress Notes (Signed)
12/10/2022  History of Present Illness: Tracy Chase is a 67 y.o. female presenting for follow up of abdominal hernia.  The patient has prior history of lap converted to open appendectomy for perforated appendicitis with intra-abdominal abscess in 2011 and then an incisional hernia repair with mesh in 2012.  She was seen on 11/19/22 with abdominal pain and a CT scan was obtained on 11/21/22 to further evaluate.  She has a ventral hernia in the mid portion of her incision which is stable overall, containing loop of small bowel, without evidence of obstruction.  She patient reports that she's continuing to have discomfort in the abdominal area at the site of her hernia.  She reports sometimes she will have constipation with abdominal distention but after a bowel movement that resolves.  Otherwise no new symptoms.  Past Medical History: Past Medical History:  Diagnosis Date   Arthritis    Cancer ([place])    RENAL CELL   Depression    GERD (gastroesophageal reflux disease)    OCC NO MEDS   Insomnia      Past Surgical History: Past Surgical History:  Procedure Laterality Date   ABDOMINAL HYSTERECTOMY     APPENDECTOMY  2011   Lap converted to exlap appendectomy, drainage of abd abscesses   BACK SURGERY     "rods in back"   COLONOSCOPY WITH PROPOFOL N/A 08/28/2019   Procedure: COLONOSCOPY WITH PROPOFOL;  Surgeon: Virgel Manifold, MD;  Location: [facility];  Service: Endoscopy;  Laterality: N/A;   cryoablation for renal cell carcinoma  2012   ETHMOIDECTOMY Left 09/06/2020   Procedure: TOTAL ETHMOIDECTOMY;  Surgeon: Clyde Canterbury, MD;  Location: [place];  Service: ENT;  Laterality: Left;   FRONTAL SINUS EXPLORATION Left 09/06/2020   Procedure: FRONTAL SINUS EXPLORATION;  Surgeon: Clyde Canterbury, MD;  Location: [place];  Service: ENT;  Laterality: Left;   IMAGE GUIDED SINUS SURGERY N/A 09/06/2020   Procedure: IMAGE GUIDED SINUS SURGERY;  Surgeon: Clyde Canterbury, MD;   Location: [place];  Service: ENT;  Laterality: N/A;  need stryker disk PLACE DISK ON OR CHARGE NURSE DESK 10-01 KP   JOINT REPLACEMENT     LUMBAR FUSION     MAXILLARY ANTROSTOMY Left 09/06/2020   Procedure: MAXILLARY ANTROSTOMY WITH TISSUE;  Surgeon: Clyde Canterbury, MD;  Location: [place];  Service: ENT;  Laterality: Left;   MEDIAL PARTIAL KNEE REPLACEMENT Left    PARTIAL HYSTERECTOMY     SPHENOIDECTOMY Left 09/06/2020   Procedure: SPHENOIDECTOMY;  Surgeon: Clyde Canterbury, MD;  Location: [place];  Service: ENT;  Laterality: Left;   TONSILLECTOMY     TOTAL KNEE REVISION Left 02/21/2021   Procedure: Revision, left partial to total knee;  Surgeon: Hessie Knows, MD;  Location: [facility];  Service: Orthopedics;  Laterality: Left;  CHRIS GAINES TO ASSIST   VENTRAL HERNIA REPAIR  2012   open ventral hernia repair with mesh    Home Medications: Prior to Admission medications   Medication Sig Start Date End Date Taking? Authorizing Provider  docusate sodium (COLACE) 100 MG capsule Take 1 capsule (100 mg total) by mouth 2 (two) times daily. Patient taking differently: Take 100 mg by mouth daily as needed. 02/23/21  Yes Duanne Guess, PA-C  FLUoxetine (PROZAC) 20 MG capsule TAKE 1 CAPSULE BY MOUTH DAILY 11/09/22  Yes Glean Hess, MD  gabapentin (NEURONTIN) 300 MG capsule Take 1 capsule by mouth at bedtime. 04/23/22  Yes [provider]  triamterene-hydrochlorothiazide (MAXZIDE-25) 37.5-25 MG tablet TAKE 1 TABLET BY MOUTH DAILY 10/05/22  Yes Glean Hess, MD  zolpidem (AMBIEN) 10 MG tablet Take 1 tablet (10 mg total) by mouth at bedtime. 11/06/22  Yes Glean Hess, MD    Allergies: Allergies  Allergen Reactions   Hydrocodone Itching    Review of Systems: Review of Systems  Constitutional:  Negative for chills and fever.  HENT:  Negative for hearing loss.   Respiratory:  Negative for shortness of breath.   Cardiovascular:  Negative  for chest pain.  Gastrointestinal:  Positive for abdominal pain. Negative for diarrhea, nausea and vomiting.  Genitourinary:  Negative for dysuria.  Musculoskeletal:  Negative for myalgias.  Skin:  Negative for rash.  Neurological:  Negative for dizziness.  Psychiatric/Behavioral:  Negative for depression.     Physical Exam BP 131/83   Pulse 66   Temp 97.7 F (36.5 C) (Oral)   Ht '5\' 6"'$  (1.676 m)   Wt 187 lb 3.2 oz (84.9 kg)   SpO2 96%   BMI 30.21 kg/m  CONSTITUTIONAL: No acute distress, well nourished. HEENT:  Normocephalic, atraumatic, extraocular motion intact. NECK:  Trachea is midline, no jugular venous distention. RESPIRATORY:  Lungs are clear, and breath sounds are equal bilaterally. Normal respiratory effort without pathologic use of accessory muscles. CARDIOVASCULAR: Heart is regular without murmurs, gallops, or rubs. GI: The abdomen is soft, non-distended, with discomfort to palpation at the site of her ventral hernia.  Midline incision is otherwise well healed.  MUSCULOSKELETAL:  Normal gait, no peripheral edema. NEUROLOGIC:  Motor and sensation is grossly normal.  Cranial nerves are grossly intact. PSYCH:  Alert and oriented to person, place and time. Affect is normal.  Labs/Imaging: CT abdomen/pelvis on 11/21/22: IMPRESSION: No acute findings within the abdomen or pelvis.   Colonic diverticulosis, without radiographic evidence of diverticulitis.   Stable small paraumbilical ventral hernia containing a small bowel loop. No evidence of bowel obstruction or strangulation.   Aortic Atherosclerosis (ICD10-I70.0).  Assessment and Plan: This is a 67 y.o. female with a recurrent incisional ventral hernia.  --Discussed with the patient the findings on her CT scan showing a recurrent incisional hernia.  The area is tender to palpation but appears to be reducible.  It is unclear on palpation how large the defect is and it's hard to see if there's a true hernia defect vs  weakness in the abdominal wall at the prior repair site.  However, it's focused in one area rather than the whole length, which would support a hernia recurrence.  I'm not able to see the mesh and whether it's displaced or not.   --In light of the CT scan findings and her symptoms, discussed with the patient that we can offer her a robotic assisted ventral hernia repair.  Reviewed the surgery at length with her including the planned incisions, the risks of bleeding, infection, injury to surrounding structures, that this would be an outpatient procedure, the use of mesh and types possible depending on the intraoperative findings, post-op pain control, activity restrictions, and she's willing to proceed. --Will schedule her surgery for 2/29/234 based on patient's schedule preference.  I spent 40 minutes dedicated to the care of this patient on the date of this encounter to include pre-visit review of records, face-to-face time with the patient discussing diagnosis and management, and any post-visit coordination of care.   Melvyn Neth, [facility]

## 2022-12-11 ENCOUNTER — Telehealth: Payer: Self-pay | Admitting: Surgery

## 2022-12-11 DIAGNOSIS — C44729 Squamous cell carcinoma of skin of left lower limb, including hip: Secondary | ICD-10-CM | POA: Diagnosis not present

## 2022-12-11 NOTE — Telephone Encounter (Signed)
Patient calls back, she is now informed of all dates regarding her surgery.

## 2022-12-11 NOTE — Telephone Encounter (Signed)
Left message for patient to call, please inform her of the following regarding scheduled surgery with Dr. Hampton Abbot.   Pre-Admission date/time, and Surgery date at [facility].  Surgery Date: 01/10/23 Preadmission Testing Date: 12/31/22 (phone 8a-1p)  Also patient will need to call atPatient has been made aware to call [phone], between 1-3:00pm the day before surgery, to find out what time to arrive for surgery.

## 2022-12-11 NOTE — Progress Notes (Unsigned)
Medical Clearance has been received from Dr Carolin Coy. The patient is cleared at low risk for surgery.

## 2022-12-17 ENCOUNTER — Ambulatory Visit
Admission: RE | Admit: 2022-12-17 | Discharge: 2022-12-17 | Disposition: A | Discharge status: Home / Self Care | Payer: Medicare Other | Source: Ambulatory Visit | Attending: Internal Medicine | Admitting: Internal Medicine

## 2022-12-17 DIAGNOSIS — Z1231 Encounter for screening mammogram for malignant neoplasm of breast: Secondary | ICD-10-CM | POA: Insufficient documentation

## 2022-12-25 ENCOUNTER — Ambulatory Visit (INDEPENDENT_AMBULATORY_CARE_PROVIDER_SITE_OTHER): Payer: Medicare Other | Admitting: Internal Medicine

## 2022-12-25 ENCOUNTER — Encounter: Payer: Self-pay | Admitting: Internal Medicine

## 2022-12-25 VITALS — BP 128/70 | HR 67 | Ht 66.0 in | Wt 191.0 lb

## 2022-12-25 DIAGNOSIS — K439 Ventral hernia without obstruction or gangrene: Secondary | ICD-10-CM

## 2022-12-25 DIAGNOSIS — I10 Essential (primary) hypertension: Secondary | ICD-10-CM

## 2022-12-25 DIAGNOSIS — F411 Generalized anxiety disorder: Secondary | ICD-10-CM | POA: Diagnosis not present

## 2022-12-25 NOTE — Progress Notes (Signed)
Date:  12/25/2022   Name:  Tracy Chase   DOB:  30-Sep-1956   MRN:  [id number]   Chief Complaint: Surgery Consultation and Anxiety  Anxiety Presents for follow-up visit. Symptoms include excessive worry and nervous/anxious behavior. Patient reports no chest pain, dizziness, palpitations, panic or shortness of breath. Symptoms occur occasionally. The quality of sleep is good.   Compliance with medications is 76-100%.   Ventral hernia - planned repair in 2 weeks.  Clearance already signed.  She anxious about possible complications and what her post op activity level will be.  Lab Results  Component Value Date   NA 142 11/06/2022   K 4.2 11/06/2022   CO2 23 11/06/2022   GLUCOSE 93 11/06/2022   BUN 11 11/06/2022   CREATININE 0.81 11/06/2022   CALCIUM 9.5 11/06/2022   EGFR 79 11/06/2022   GFRNONAA >60 02/22/2021   Lab Results  Component Value Date   CHOL 219 (H) 11/06/2022   HDL 95 11/06/2022   LDLCALC 112 (H) 11/06/2022   TRIG 66 11/06/2022   CHOLHDL 2.3 11/06/2022   Lab Results  Component Value Date   TSH 1.620 11/06/2022   Lab Results  Component Value Date   HGBA1C 5.3 11/01/2021   Lab Results  Component Value Date   WBC 4.7 11/06/2022   HGB 13.4 11/06/2022   HCT 40.7 11/06/2022   MCV 92 11/06/2022   PLT 208 11/06/2022   Lab Results  Component Value Date   ALT 16 11/06/2022   AST 19 11/06/2022   ALKPHOS 62 11/06/2022   BILITOT 0.4 11/06/2022   No results found for: "25OHVITD2", "25OHVITD3", "VD25OH"   Review of Systems  Constitutional:  Negative for chills, fatigue and fever.  Respiratory:  Negative for cough, chest tightness and shortness of breath.   Cardiovascular:  Negative for chest pain and palpitations.  Gastrointestinal:  Positive for abdominal distention (and bloating after eating).  Neurological:  Negative for dizziness and headaches.  Psychiatric/Behavioral:  Positive for sleep disturbance. Negative for dysphoric mood. The patient is  nervous/anxious.     Patient Active Problem List   Diagnosis Date Noted   Rib lesion 08/09/2022   Trigger index finger of right hand 11/01/2021   Atherosclerosis of aorta ([place]) 10/17/2021   S/P TKR (total knee replacement) using cement, left 02/21/2021   Mixed hyperlipidemia 09/25/2019   Encounter for screening colonoscopy    Polyp of colon    Degenerative disc disease, cervical 11/18/2017   Generalized anxiety disorder 06/21/2015   History of renal cell cancer 06/21/2015   Carpal tunnel syndrome 06/21/2015   Essential (primary) hypertension 06/21/2015   Idiopathic insomnia 06/21/2015    Allergies  Allergen Reactions   Hydrocodone Itching    Past Surgical History:  Procedure Laterality Date   ABDOMINAL HYSTERECTOMY     APPENDECTOMY  2011   Lap converted to exlap appendectomy, drainage of abd abscesses   BACK SURGERY     "rods in back"   COLONOSCOPY WITH PROPOFOL N/A 08/28/2019   Procedure: COLONOSCOPY WITH PROPOFOL;  Surgeon: Virgel Manifold, MD;  Location: [facility];  Service: Endoscopy;  Laterality: N/A;   cryoablation for renal cell carcinoma  2012   ETHMOIDECTOMY Left 09/06/2020   Procedure: TOTAL ETHMOIDECTOMY;  Surgeon: Clyde Canterbury, MD;  Location: [place];  Service: ENT;  Laterality: Left;   FRONTAL SINUS EXPLORATION Left 09/06/2020   Procedure: FRONTAL SINUS EXPLORATION;  Surgeon: Clyde Canterbury, MD;  Location: [place];  Service: ENT;  Laterality: Left;   IMAGE GUIDED SINUS SURGERY N/A 09/06/2020   Procedure: IMAGE GUIDED SINUS SURGERY;  Surgeon: Clyde Canterbury, MD;  Location: [place];  Service: ENT;  Laterality: N/A;  need stryker disk PLACE DISK ON OR CHARGE NURSE DESK 10-01 KP   JOINT REPLACEMENT     LUMBAR FUSION     MAXILLARY ANTROSTOMY Left 09/06/2020   Procedure: MAXILLARY ANTROSTOMY WITH TISSUE;  Surgeon: Clyde Canterbury, MD;  Location: [place];  Service: ENT;  Laterality: Left;   MEDIAL PARTIAL KNEE  REPLACEMENT Left    PARTIAL HYSTERECTOMY     SPHENOIDECTOMY Left 09/06/2020   Procedure: SPHENOIDECTOMY;  Surgeon: Clyde Canterbury, MD;  Location: [place];  Service: ENT;  Laterality: Left;   TONSILLECTOMY     TOTAL KNEE REVISION Left 02/21/2021   Procedure: Revision, left partial to total knee;  Surgeon: Hessie Knows, MD;  Location: [facility];  Service: Orthopedics;  Laterality: Left;  CHRIS GAINES TO ASSIST   VENTRAL HERNIA REPAIR  2012   open ventral hernia repair with mesh    Social History   Tobacco Use   Smoking status: Former    Types: Cigarettes    Quit date: 1998    Years since quitting: 26.1   Smokeless tobacco: Never   Tobacco comments:    was "social Smoker" over 15 yrs ago  Vaping Use   Vaping Use: Never used  Substance Use Topics   Alcohol use: Yes    Alcohol/week: 0.0 standard drinks of alcohol    Comment: socially 1-2X/mo   Drug use: No     Medication list has been reviewed and updated.  Current Meds  Medication Sig   docusate sodium (COLACE) 100 MG capsule Take 1 capsule (100 mg total) by mouth 2 (two) times daily. (Patient taking differently: Take 100 mg by mouth daily as needed.)   FLUoxetine (PROZAC) 20 MG capsule TAKE 1 CAPSULE BY MOUTH DAILY   gabapentin (NEURONTIN) 300 MG capsule Take 1 capsule by mouth at bedtime.   triamterene-hydrochlorothiazide (MAXZIDE-25) 37.5-25 MG tablet TAKE 1 TABLET BY MOUTH DAILY   zolpidem (AMBIEN) 10 MG tablet Take 1 tablet (10 mg total) by mouth at bedtime.       12/25/2022    8:40 AM 11/06/2022   10:05 AM 07/27/2022    3:37 PM 04/26/2022    9:52 AM  GAD 7 : Generalized Anxiety Score  Nervous, Anxious, on Edge 2 1 2 $ 0  Control/stop worrying 2 3 2 2  $ Worry too much - different things 2 2 2 2  $ Trouble relaxing 2 1 2 1  $ Restless 2 1 2 1  $ Easily annoyed or irritable 2 0 0 0  Afraid - awful might happen 2 2 0 2  Total GAD 7 Score 14 10 10 8  $ Anxiety Difficulty Not difficult at all Not difficult at all Not  difficult at all Not difficult at all       12/25/2022    8:40 AM 11/06/2022   10:05 AM 07/27/2022    3:37 PM  Depression screen PHQ 2/9  Decreased Interest 0 0 0  Down, Depressed, Hopeless 0 1 0  PHQ - 2 Score 0 1 0  Altered sleeping 1 0 0  Tired, decreased energy 0 0 2  Change in appetite 0 0 2  Feeling bad or failure about yourself  0 0 0  Trouble concentrating 0 0 0  Moving slowly or fidgety/restless 0 0 0  Suicidal thoughts 0 0  0  PHQ-9 Score 1 1 4  $ Difficult doing work/chores Not difficult at all Not difficult at all Not difficult at all    BP Readings from Last 3 Encounters:  12/25/22 128/70  12/10/22 131/83  11/19/22 121/80    Physical Exam Vitals and nursing note reviewed.  Constitutional:      General: She is not in acute distress.    Appearance: Normal appearance. She is well-developed.  HENT:     Head: Normocephalic and atraumatic.  Cardiovascular:     Rate and Rhythm: Normal rate and regular rhythm.  Pulmonary:     Effort: Pulmonary effort is normal. No respiratory distress.     Breath sounds: No wheezing or rhonchi.  Musculoskeletal:     Right lower leg: No edema.     Left lower leg: No edema.  Skin:    General: Skin is warm and dry.     Capillary Refill: Capillary refill takes less than 2 seconds.     Findings: No rash.  Neurological:     Mental Status: She is alert and oriented to person, place, and time.  Psychiatric:        Mood and Affect: Mood normal.        Behavior: Behavior normal.     Wt Readings from Last 3 Encounters:  12/25/22 191 lb (86.6 kg)  12/10/22 187 lb 3.2 oz (84.9 kg)  11/19/22 190 lb 9.6 oz (86.5 kg)    BP 128/70   Pulse 67   Ht 5' 6"$  (1.676 m)   Wt 191 lb (86.6 kg)   SpO2 97%   BMI 30.83 kg/m   Assessment and Plan: Problem List Items Addressed This Visit       Cardiovascular and Mediastinum   Essential (primary) hypertension - Primary (Chronic)    Clinically stable exam with well controlled BP on  maxzide. Tolerating medications without side effects. Pt to continue current regimen and low sodium diet.         Other   Generalized anxiety disorder (Chronic)    Clinically stable on current regimen with good control of symptoms, No SI or HI. Mild increase in anxiety with upcoming surgery but not severe.  Patient is reassured. No change in management at this time.       Other Visit Diagnoses     Ventral hernia without obstruction or gangrene       surgery planned for 2/29        Partially dictated using Dragon software. Any errors are unintentional.  Halina Maidens, MD [facility]  12/25/2022

## 2022-12-25 NOTE — Assessment & Plan Note (Signed)
Clinically stable on current regimen with good control of symptoms, No SI or HI. Mild increase in anxiety with upcoming surgery but not severe.  Patient is reassured. No change in management at this time.

## 2022-12-25 NOTE — Assessment & Plan Note (Signed)
Clinically stable exam with well controlled BP on maxzide. Tolerating medications without side effects. Pt to continue current regimen and low sodium diet.

## 2022-12-31 ENCOUNTER — Encounter
Admission: RE | Admit: 2022-12-31 | Discharge: 2022-12-31 | Disposition: A | Discharge status: Home / Self Care | Payer: Medicare Other | Source: Ambulatory Visit | Attending: Surgery | Admitting: Surgery

## 2022-12-31 DIAGNOSIS — I700 Atherosclerosis of aorta: Secondary | ICD-10-CM

## 2022-12-31 DIAGNOSIS — Z01812 Encounter for preprocedural laboratory examination: Secondary | ICD-10-CM

## 2022-12-31 DIAGNOSIS — Z79899 Other long term (current) drug therapy: Secondary | ICD-10-CM

## 2022-12-31 DIAGNOSIS — Z01810 Encounter for preprocedural cardiovascular examination: Secondary | ICD-10-CM

## 2022-12-31 DIAGNOSIS — I10 Essential (primary) hypertension: Secondary | ICD-10-CM

## 2022-12-31 HISTORY — DX: Anemia, unspecified: D64.9

## 2022-12-31 HISTORY — DX: Incisional hernia without obstruction or gangrene: K43.2

## 2022-12-31 HISTORY — DX: Sepsis, unspecified organism: A41.9

## 2022-12-31 HISTORY — DX: Atherosclerosis of aorta: I70.0

## 2022-12-31 HISTORY — DX: Essential (primary) hypertension: I10

## 2022-12-31 NOTE — Patient Instructions (Addendum)
Your procedure is scheduled on:01-10-23 Thursday Report to the Registration Desk on the 1st floor of the [facility].Then proceed to the 2nd floor Surgery Desk To find out your arrival time, please call [phone] between 1PM - 3PM on:01-09-23 Wednesday If your arrival time is 6:00 am, do not arrive before that time as the [facility] entrance doors do not open until 6:00 am.  REMEMBER: Instructions that are not followed completely may result in serious medical risk, up to and including death; or upon the discretion of your surgeon and anesthesiologist your surgery may need to be rescheduled.  Do not eat food after midnight the night before surgery.  No gum chewing or hard candies.  You may however, drink CLEAR liquids up to 2 hours before you are scheduled to arrive for your surgery. Do not drink anything within 2 hours of your scheduled arrival time.  Clear liquids include: - water  - apple juice without pulp - gatorade (not RED colors) - black coffee or tea (Do NOT add milk or creamers to the coffee or tea) Do NOT drink anything that is not on this list.  One week prior to surgery: Stop Anti-inflammatories (NSAIDS) such as Advil, Aleve, Ibuprofen, Motrin, Naproxen, Naprosyn and Aspirin based products such as Excedrin, Goody's Powder, BC Powder.You may however, continue to take Tylenol if needed for pain up until the day of surgery.  Stop ANY OVER THE COUNTER supplements/vitamins 7 days prior to surgery.  TAKE ONLY THESE MEDICATIONS THE MORNING OF SURGERY WITH A SIP OF WATER: -FLUoxetine (PROZAC)   No Alcohol for 24 hours before or after surgery.  No Smoking including e-cigarettes for 24 hours before surgery.  No chewable tobacco products for at least 6 hours before surgery.  No nicotine patches on the day of surgery.  Do not use any "recreational" drugs for at least a week (preferably 2 weeks) before your surgery.  Please be advised that the combination of cocaine and  anesthesia may have negative outcomes, up to and including death. If you test positive for cocaine, your surgery will be cancelled.  On the morning of surgery brush your teeth with toothpaste and water, you may rinse your mouth with mouthwash if you wish. Do not swallow any toothpaste or mouthwash.  Use CHG Soap as directed on instruction sheet.  Do not wear jewelry, make-up, hairpins, clips or nail polish.  Do not wear lotions, powders, or perfumes.   Do not shave body hair from the neck down 48 hours before surgery.  Contact lenses, hearing aids and dentures may not be worn into surgery.  Do not bring valuables to the hospital. [facility] is not responsible for any missing/lost belongings or valuables.   Notify your doctor if there is any change in your medical condition (cold, fever, infection).  Wear comfortable clothing (specific to your surgery type) to the hospital.  After surgery, you can help prevent lung complications by doing breathing exercises.  Take deep breaths and cough every 1-2 hours. Your doctor may order a device called an Incentive Spirometer to help you take deep breaths. When coughing or sneezing, hold a pillow firmly against your incision with both hands. This is called "splinting." Doing this helps protect your incision. It also decreases belly discomfort.  If you are being admitted to the hospital overnight, leave your suitcase in the car. After surgery it may be brought to your room.  In case of increased patient census, it may be necessary for you, the patient,  to continue your postoperative care in the Same Day Surgery department.  If you are being discharged the day of surgery, you will not be allowed to drive home. You will need a responsible individual to drive you home and stay with you for 24 hours after surgery.   If you are taking public transportation, you will need to have a responsible individual with you.  Please call the [facility] Dept. at [phone] if you have any questions about these instructions.  Surgery Visitation Policy:  Patients undergoing a surgery or procedure may have two family members or support persons with them as long as the person is not COVID-19 positive or experiencing its symptoms.   Due to an increase in RSV and influenza rates and associated hospitalizations, children ages 73 and under will not be able to visit patients in [facility]. Masks continue to be strongly recommended.     Preparing for Surgery with CHLORHEXIDINE GLUCONATE (CHG) Soap  Chlorhexidine Gluconate (CHG) Soap  o An antiseptic cleaner that kills germs and bonds with the skin to continue killing germs even after washing  o Used for showering the night before surgery and morning of surgery  Before surgery, you can play an important role by reducing the number of germs on your skin.  CHG (Chlorhexidine gluconate) soap is an antiseptic cleanser which kills germs and bonds with the skin to continue killing germs even after washing.  Please do not use if you have an allergy to CHG or antibacterial soaps. If your skin becomes reddened/irritated stop using the CHG.  1. Shower the NIGHT BEFORE SURGERY and the MORNING OF SURGERY with CHG soap.  2. If you choose to wash your hair, wash your hair first as usual with your normal shampoo.  3. After shampooing, rinse your hair and body thoroughly to remove the shampoo.  4. Use CHG as you would any other liquid soap. You can apply CHG directly to the skin and wash gently with a scrungie or a clean washcloth.  5. Apply the CHG soap to your body only from the neck down. Do not use on open wounds or open sores. Avoid contact with your eyes, ears, mouth, and genitals (private parts). Wash face and genitals (private parts) with your normal soap.  6. Wash thoroughly, paying special attention to the area where your surgery will be performed.  7. Thoroughly rinse  your body with warm water.  8. Do not shower/wash with your normal soap after using and rinsing off the CHG soap.  9. Pat yourself dry with a clean towel.  10. Wear clean pajamas to bed the night before surgery.  12. Place clean sheets on your bed the night of your first shower and do not sleep with pets.  13. Shower again with the CHG soap on the day of surgery prior to arriving at the hospital.  14. Do not apply any deodorants/lotions/powders.  15. Please wear clean clothes to the hospital.

## 2022-12-31 NOTE — Pre-Procedure Instructions (Signed)
Pt unable to come in for labs EKG until 01-08-23 pt is at [employer]

## 2023-01-08 ENCOUNTER — Encounter
Admission: RE | Admit: 2023-01-08 | Discharge: 2023-01-08 | Disposition: A | Discharge status: Home / Self Care | Payer: Medicare Other | Source: Ambulatory Visit | Attending: Surgery | Admitting: Surgery

## 2023-01-08 DIAGNOSIS — Z01818 Encounter for other preprocedural examination: Secondary | ICD-10-CM | POA: Diagnosis not present

## 2023-01-08 DIAGNOSIS — Z01812 Encounter for preprocedural laboratory examination: Secondary | ICD-10-CM

## 2023-01-08 DIAGNOSIS — R001 Bradycardia, unspecified: Secondary | ICD-10-CM | POA: Insufficient documentation

## 2023-01-08 DIAGNOSIS — Z79899 Other long term (current) drug therapy: Secondary | ICD-10-CM | POA: Insufficient documentation

## 2023-01-08 DIAGNOSIS — I10 Essential (primary) hypertension: Secondary | ICD-10-CM | POA: Diagnosis not present

## 2023-01-08 DIAGNOSIS — Z01810 Encounter for preprocedural cardiovascular examination: Secondary | ICD-10-CM

## 2023-01-08 DIAGNOSIS — I700 Atherosclerosis of aorta: Secondary | ICD-10-CM | POA: Insufficient documentation

## 2023-01-08 LAB — BASIC METABOLIC PANEL
Anion gap: 10 (ref 5–15)
BUN: 10 mg/dL (ref 8–23)
CO2: 27 mmol/L (ref 22–32)
Calcium: 8.8 mg/dL — ABNORMAL LOW (ref 8.9–10.3)
Chloride: 102 mmol/L (ref 98–111)
Creatinine, Ser: 0.68 mg/dL (ref 0.44–1.00)
GFR, Estimated: >60 mL/min (ref >60)
Glucose, Bld: 94 mg/dL (ref 70–99)
Potassium: 3.8 mmol/L (ref 3.5–5.1)
Sodium: 139 mmol/L (ref 135–145)

## 2023-01-10 ENCOUNTER — Encounter: Payer: Self-pay | Admitting: Surgery

## 2023-01-10 ENCOUNTER — Other Ambulatory Visit: Payer: Self-pay

## 2023-01-10 ENCOUNTER — Ambulatory Visit: Payer: Medicare Other | Admitting: Urgent Care

## 2023-01-10 ENCOUNTER — Ambulatory Visit: Payer: Medicare Other | Admitting: Anesthesiology

## 2023-01-10 ENCOUNTER — Ambulatory Visit
Admission: RE | Admit: 2023-01-10 | Discharge: 2023-01-10 | Disposition: A | Discharge status: Home / Self Care | Payer: Medicare Other | Attending: Surgery | Admitting: Surgery

## 2023-01-10 ENCOUNTER — Encounter
Admission: RE | Disposition: A | Discharge status: Home / Self Care | Payer: Self-pay | Source: Home / Self Care | Attending: Surgery

## 2023-01-10 DIAGNOSIS — K219 Gastro-esophageal reflux disease without esophagitis: Secondary | ICD-10-CM | POA: Insufficient documentation

## 2023-01-10 DIAGNOSIS — K432 Incisional hernia without obstruction or gangrene: Secondary | ICD-10-CM | POA: Diagnosis not present

## 2023-01-10 DIAGNOSIS — G709 Myoneural disorder, unspecified: Secondary | ICD-10-CM | POA: Diagnosis not present

## 2023-01-10 DIAGNOSIS — Z87891 Personal history of nicotine dependence: Secondary | ICD-10-CM | POA: Insufficient documentation

## 2023-01-10 DIAGNOSIS — F32A Depression, unspecified: Secondary | ICD-10-CM | POA: Diagnosis not present

## 2023-01-10 DIAGNOSIS — K660 Peritoneal adhesions (postprocedural) (postinfection): Secondary | ICD-10-CM | POA: Insufficient documentation

## 2023-01-10 DIAGNOSIS — I10 Essential (primary) hypertension: Secondary | ICD-10-CM | POA: Insufficient documentation

## 2023-01-10 DIAGNOSIS — F419 Anxiety disorder, unspecified: Secondary | ICD-10-CM | POA: Insufficient documentation

## 2023-01-10 DIAGNOSIS — T7840XA Allergy, unspecified, initial encounter: Secondary | ICD-10-CM | POA: Diagnosis not present

## 2023-01-10 HISTORY — PX: XI ROBOTIC ASSISTED VENTRAL HERNIA: SHX6789

## 2023-01-10 HISTORY — PX: INSERTION OF MESH: SHX5868

## 2023-01-10 SURGERY — REPAIR, HERNIA, VENTRAL, ROBOT-ASSISTED
Anesthesia: General

## 2023-01-10 MED ORDER — GLYCOPYRROLATE 0.2 MG/ML IJ SOLN
INTRAMUSCULAR | Status: DC | PRN
  Administered 2023-01-10: .2 mg via INTRAVENOUS

## 2023-01-10 MED ORDER — DEXAMETHASONE SODIUM PHOSPHATE 10 MG/ML IJ SOLN
INTRAMUSCULAR | Status: DC | PRN
  Administered 2023-01-10: 10 mg via INTRAVENOUS

## 2023-01-10 MED ORDER — CHLORHEXIDINE GLUCONATE CLOTH 2 % EX PADS: 6.0000 | MEDICATED_PAD | Freq: Once | CUTANEOUS | Status: DC

## 2023-01-10 MED ORDER — MIDAZOLAM HCL 2 MG/2ML IJ SOLN
INTRAMUSCULAR | Status: AC
  Filled 2023-01-10: qty 2

## 2023-01-10 MED ORDER — PROMETHAZINE HCL 25 MG/ML IJ SOLN: 6.2500 mg | INTRAMUSCULAR | Status: DC | PRN

## 2023-01-10 MED ORDER — LIDOCAINE HCL (CARDIAC) PF 100 MG/5ML IV SOSY
PREFILLED_SYRINGE | INTRAVENOUS | Status: DC | PRN
  Administered 2023-01-10: 100 mg via INTRAVENOUS

## 2023-01-10 MED ORDER — ACETAMINOPHEN 500 MG PO TABS: 1000.0000 mg | ORAL_TABLET | Freq: Four times a day (QID) | ORAL | Status: DC | PRN

## 2023-01-10 MED ORDER — SUGAMMADEX SODIUM 200 MG/2ML IV SOLN
INTRAVENOUS | Status: DC | PRN
  Administered 2023-01-10: 200 mg via INTRAVENOUS

## 2023-01-10 MED ORDER — EPHEDRINE SULFATE (PRESSORS) 50 MG/ML IJ SOLN
INTRAMUSCULAR | Status: DC | PRN
  Administered 2023-01-10: 5 mg via INTRAVENOUS

## 2023-01-10 MED ORDER — CHLORHEXIDINE GLUCONATE CLOTH 2 % EX PADS
6.0000 | MEDICATED_PAD | Freq: Once | CUTANEOUS | Status: AC
  Administered 2023-01-10: 6 via TOPICAL

## 2023-01-10 MED ORDER — BUPIVACAINE LIPOSOME 1.3 % IJ SUSP: 20.0000 mL | Freq: Once | INTRAMUSCULAR | Status: DC

## 2023-01-10 MED ORDER — FENTANYL CITRATE (PF) 100 MCG/2ML IJ SOLN
INTRAMUSCULAR | Status: AC
  Administered 2023-01-10: 50 ug via INTRAVENOUS
  Filled 2023-01-10: qty 2

## 2023-01-10 MED ORDER — PROPOFOL 10 MG/ML IV BOLUS
INTRAVENOUS | Status: DC | PRN
  Administered 2023-01-10: 150 mg via INTRAVENOUS

## 2023-01-10 MED ORDER — GABAPENTIN 300 MG PO CAPS: 300.0000 mg | ORAL_CAPSULE | ORAL | Status: AC

## 2023-01-10 MED ORDER — LACTATED RINGERS IV SOLN: INTRAVENOUS | Status: DC

## 2023-01-10 MED ORDER — OXYCODONE HCL 5 MG/5ML PO SOLN: 5.0000 mg | Freq: Once | ORAL | Status: AC | PRN

## 2023-01-10 MED ORDER — BUPIVACAINE HCL (PF) 0.5 % IJ SOLN
INTRAMUSCULAR | Status: AC
  Filled 2023-01-10: qty 30

## 2023-01-10 MED ORDER — DROPERIDOL 2.5 MG/ML IJ SOLN: 0.6250 mg | Freq: Once | INTRAMUSCULAR | Status: DC | PRN

## 2023-01-10 MED ORDER — PROPOFOL 1000 MG/100ML IV EMUL
INTRAVENOUS | Status: AC
  Filled 2023-01-10: qty 100

## 2023-01-10 MED ORDER — CEFAZOLIN SODIUM-DEXTROSE 2-4 GM/100ML-% IV SOLN
2.0000 g | INTRAVENOUS | Status: AC
  Administered 2023-01-10 (×2): 2 g via INTRAVENOUS

## 2023-01-10 MED ORDER — CHLORHEXIDINE GLUCONATE 0.12 % MT SOLN
OROMUCOSAL | Status: AC
  Administered 2023-01-10: 15 mL via OROMUCOSAL
  Filled 2023-01-10: qty 15

## 2023-01-10 MED ORDER — ACETAMINOPHEN 10 MG/ML IV SOLN: 1000.0000 mg | Freq: Once | INTRAVENOUS | Status: DC | PRN

## 2023-01-10 MED ORDER — DEXMEDETOMIDINE HCL IN NACL 200 MCG/50ML IV SOLN
INTRAVENOUS | Status: DC | PRN
  Administered 2023-01-10: 8 ug via INTRAVENOUS

## 2023-01-10 MED ORDER — FENTANYL CITRATE (PF) 100 MCG/2ML IJ SOLN
INTRAMUSCULAR | Status: AC
  Filled 2023-01-10: qty 2

## 2023-01-10 MED ORDER — OXYCODONE HCL 5 MG PO TABS: 5.0000 mg | ORAL_TABLET | Freq: Once | ORAL | Status: AC | PRN

## 2023-01-10 MED ORDER — OXYCODONE HCL 5 MG PO TABS: 5.0000 mg | ORAL_TABLET | ORAL | 0 refills | Status: DC | PRN

## 2023-01-10 MED ORDER — ORAL CARE MOUTH RINSE: 15.0000 mL | Freq: Once | OROMUCOSAL | Status: AC

## 2023-01-10 MED ORDER — BUPIVACAINE LIPOSOME 1.3 % IJ SUSP
INTRAMUSCULAR | Status: AC
  Filled 2023-01-10: qty 20

## 2023-01-10 MED ORDER — IBUPROFEN 600 MG PO TABS: 600.0000 mg | ORAL_TABLET | Freq: Three times a day (TID) | ORAL | 1 refills | Status: DC | PRN

## 2023-01-10 MED ORDER — EPINEPHRINE PF 1 MG/ML IJ SOLN
INTRAMUSCULAR | Status: AC
  Filled 2023-01-10: qty 1

## 2023-01-10 MED ORDER — FAMOTIDINE 20 MG PO TABS
ORAL_TABLET | ORAL | Status: AC
  Administered 2023-01-10: 20 mg via ORAL
  Filled 2023-01-10: qty 1

## 2023-01-10 MED ORDER — ONDANSETRON HCL 4 MG/2ML IJ SOLN
INTRAMUSCULAR | Status: DC | PRN
  Administered 2023-01-10 (×2): 4 mg via INTRAVENOUS

## 2023-01-10 MED ORDER — FAMOTIDINE 20 MG PO TABS: 20.0000 mg | ORAL_TABLET | Freq: Once | ORAL | Status: AC

## 2023-01-10 MED ORDER — FENTANYL CITRATE (PF) 100 MCG/2ML IJ SOLN
INTRAMUSCULAR | Status: DC | PRN
  Administered 2023-01-10: 50 ug via INTRAVENOUS
  Administered 2023-01-10: 100 ug via INTRAVENOUS
  Administered 2023-01-10: 50 ug via INTRAVENOUS

## 2023-01-10 MED ORDER — PHENYLEPHRINE HCL-NACL 20-0.9 MG/250ML-% IV SOLN
INTRAVENOUS | Status: DC | PRN
  Administered 2023-01-10: 15 ug/min via INTRAVENOUS

## 2023-01-10 MED ORDER — MIDAZOLAM HCL 2 MG/2ML IJ SOLN
INTRAMUSCULAR | Status: DC | PRN
  Administered 2023-01-10: 2 mg via INTRAVENOUS

## 2023-01-10 MED ORDER — FENTANYL CITRATE (PF) 100 MCG/2ML IJ SOLN
25.0000 ug | INTRAMUSCULAR | Status: DC | PRN
  Administered 2023-01-10: 50 ug via INTRAVENOUS

## 2023-01-10 MED ORDER — ROCURONIUM BROMIDE 100 MG/10ML IV SOLN
INTRAVENOUS | Status: DC | PRN
  Administered 2023-01-10: 20 mg via INTRAVENOUS
  Administered 2023-01-10: 60 mg via INTRAVENOUS

## 2023-01-10 MED ORDER — GABAPENTIN 300 MG PO CAPS
ORAL_CAPSULE | ORAL | Status: AC
  Administered 2023-01-10: 300 mg via ORAL
  Filled 2023-01-10: qty 1

## 2023-01-10 MED ORDER — SODIUM CHLORIDE (PF) 0.9 % IJ SOLN
INTRAMUSCULAR | Status: DC | PRN
  Administered 2023-01-10: 70 mL

## 2023-01-10 MED ORDER — ACETAMINOPHEN 500 MG PO TABS
ORAL_TABLET | ORAL | Status: AC
  Administered 2023-01-10: 1000 mg via ORAL
  Filled 2023-01-10: qty 2

## 2023-01-10 MED ORDER — ACETAMINOPHEN 500 MG PO TABS: 1000.0000 mg | ORAL_TABLET | ORAL | Status: AC

## 2023-01-10 MED ORDER — SEVOFLURANE IN SOLN
RESPIRATORY_TRACT | Status: AC
  Filled 2023-01-10: qty 250

## 2023-01-10 MED ORDER — CHLORHEXIDINE GLUCONATE 0.12 % MT SOLN: 15.0000 mL | Freq: Once | OROMUCOSAL | Status: AC

## 2023-01-10 MED ORDER — OXYCODONE HCL 5 MG PO TABS
ORAL_TABLET | ORAL | Status: AC
  Administered 2023-01-10: 5 mg via ORAL
  Filled 2023-01-10: qty 1

## 2023-01-10 MED ORDER — 0.9 % SODIUM CHLORIDE (POUR BTL) OPTIME
TOPICAL | Status: DC | PRN
  Administered 2023-01-10: 150 mL

## 2023-01-10 MED ORDER — KETOROLAC TROMETHAMINE 30 MG/ML IJ SOLN
INTRAMUSCULAR | Status: DC | PRN
  Administered 2023-01-10: 30 mg via INTRAVENOUS

## 2023-01-10 MED ORDER — SODIUM CHLORIDE 0.9 % IV SOLN: INTRAVENOUS | Status: DC | PRN

## 2023-01-10 MED ORDER — CEFAZOLIN SODIUM-DEXTROSE 2-4 GM/100ML-% IV SOLN
INTRAVENOUS | Status: AC
  Filled 2023-01-10: qty 100

## 2023-01-10 SURGICAL SUPPLY — 57 items
ADH SKN CLS APL DERMABOND .7 (GAUZE/BANDAGES/DRESSINGS) ×2
BINDER ABDOMINAL 12 ML 46-62 (SOFTGOODS) IMPLANT
BLADE SURG SZ11 CARB STEEL (BLADE) ×2 IMPLANT
CANNULA CAP OBTURATR AIRSEAL 8 (CAP) IMPLANT
CANNULA REDUC XI 12-8 STAPL (CANNULA) ×2
CANNULA REDUCER 12-8 DVNC XI (CANNULA) ×2 IMPLANT
COVER TIP SHEARS 8 DVNC (MISCELLANEOUS) ×2 IMPLANT
COVER TIP SHEARS 8MM DA VINCI (MISCELLANEOUS) ×2
COVER WAND RF STERILE (DRAPES) ×2 IMPLANT
DERMABOND ADVANCED .7 DNX12 (GAUZE/BANDAGES/DRESSINGS) ×2 IMPLANT
DRAPE ARM DVNC X/XI (DISPOSABLE) ×6 IMPLANT
DRAPE COLUMN DVNC XI (DISPOSABLE) ×2 IMPLANT
DRAPE DA VINCI XI ARM (DISPOSABLE) ×6
DRAPE DA VINCI XI COLUMN (DISPOSABLE) ×2
ELECT CAUTERY BLADE TIP 2.5 (TIP) ×2
ELECT REM PT RETURN 9FT ADLT (ELECTROSURGICAL) ×2
ELECTRODE CAUTERY BLDE TIP 2.5 (TIP) ×2 IMPLANT
ELECTRODE REM PT RTRN 9FT ADLT (ELECTROSURGICAL) ×2 IMPLANT
GLOVE SURG SYN 7.0 (GLOVE) ×4 IMPLANT
GLOVE SURG SYN 7.0 PF PI (GLOVE) ×4 IMPLANT
GLOVE SURG SYN 7.5  E (GLOVE) ×4
GLOVE SURG SYN 7.5 E (GLOVE) ×4 IMPLANT
GLOVE SURG SYN 7.5 PF PI (GLOVE) ×4 IMPLANT
GOWN STRL REUS W/ TWL LRG LVL3 (GOWN DISPOSABLE) ×6 IMPLANT
GOWN STRL REUS W/TWL LRG LVL3 (GOWN DISPOSABLE) ×10
GRASPER SUT TROCAR 14GX15 (MISCELLANEOUS) ×2 IMPLANT
KIT PINK PAD W/HEAD ARE REST (MISCELLANEOUS) ×2
KIT PINK PAD W/HEAD ARM REST (MISCELLANEOUS) ×2 IMPLANT
LABEL OR SOLS (LABEL) ×2 IMPLANT
MANIFOLD NEPTUNE II (INSTRUMENTS) ×2 IMPLANT
MESH VENT LT ST 15CM CRL ECHO2 (Mesh General) IMPLANT
MESH VENTRALIGHT ST 4.5 ECHO (Mesh General) IMPLANT
NDL INSUFFLATION 14GA 120MM (NEEDLE) ×2 IMPLANT
NEEDLE HYPO 22GX1.5 SAFETY (NEEDLE) ×2 IMPLANT
NEEDLE INSUFFLATION 14GA 120MM (NEEDLE) ×2 IMPLANT
OBTURATOR OPTICAL STANDARD 8MM (TROCAR) ×2
OBTURATOR OPTICAL STND 8 DVNC (TROCAR) ×2
OBTURATOR OPTICALSTD 8 DVNC (TROCAR) ×2 IMPLANT
PACK LAP CHOLECYSTECTOMY (MISCELLANEOUS) ×2 IMPLANT
PENCIL SMOKE EVACUATOR (MISCELLANEOUS) ×2 IMPLANT
SEAL CANN UNIV 5-8 DVNC XI (MISCELLANEOUS) ×4 IMPLANT
SEAL XI 5MM-8MM UNIVERSAL (MISCELLANEOUS) ×4
SET TUBE FILTERED XL AIRSEAL (SET/KITS/TRAYS/PACK) IMPLANT
SOL ELECTROSURG ANTI STICK (MISCELLANEOUS) ×2
SOLUTION ELECTROSURG ANTI STCK (MISCELLANEOUS) ×2 IMPLANT
STAPLER CANNULA SEAL DVNC XI (STAPLE) ×2 IMPLANT
STAPLER CANNULA SEAL XI (STAPLE) ×2
SUT MNCRL 4-0 (SUTURE) ×4
SUT MNCRL 4-0 27XMFL (SUTURE) ×4
SUT STRATAFIX PDS 30 CT-1 (SUTURE) ×2 IMPLANT
SUT V-LOC 90 ABS 3-0 VLT  V-20 (SUTURE) ×4
SUT V-LOC 90 ABS 3-0 VLT V-20 (SUTURE) IMPLANT
SUT VICRYL 0 UR6 27IN ABS (SUTURE) ×4 IMPLANT
SUT VLOC 90 2/L VL 12 GS22 (SUTURE) ×4 IMPLANT
SUTURE MNCRL 4-0 27XMF (SUTURE) ×2 IMPLANT
TRAY FOLEY SLVR 16FR LF STAT (SET/KITS/TRAYS/PACK) ×2 IMPLANT
WATER STERILE IRR 500ML POUR (IV SOLUTION) ×2 IMPLANT

## 2023-01-10 NOTE — H&P (Signed)
History of Present Illness: Tracy Chase is a 67 y.o. female presenting for follow up of abdominal hernia.  The patient has prior history of lap converted to open appendectomy for perforated appendicitis with intra-abdominal abscess in 2011 and then an incisional hernia repair with mesh in 2012.  She was seen on 11/19/22 with abdominal pain and a CT scan was obtained on 11/21/22 to further evaluate.  She has a ventral hernia in the mid portion of her incision which is stable overall, containing loop of small bowel, without evidence of obstruction.  She patient reports that she's continuing to have discomfort in the abdominal area at the site of her hernia.  She reports sometimes she will have constipation with abdominal distention but after a bowel movement that resolves.  Otherwise no new symptoms.   Past Medical History:     Past Medical History:  Diagnosis Date   Arthritis     Cancer ([place])      RENAL CELL   Depression     GERD (gastroesophageal reflux disease)      OCC NO MEDS   Insomnia        Past Surgical History:      Past Surgical History:  Procedure Laterality Date   ABDOMINAL HYSTERECTOMY       APPENDECTOMY   2011    Lap converted to exlap appendectomy, drainage of abd abscesses   BACK SURGERY        "rods in back"   COLONOSCOPY WITH PROPOFOL N/A 08/28/2019    Procedure: COLONOSCOPY WITH PROPOFOL;  Surgeon: Virgel Manifold, MD;  Location: [facility];  Service: Endoscopy;  Laterality: N/A;   cryoablation for renal cell carcinoma   2012   ETHMOIDECTOMY Left 09/06/2020    Procedure: TOTAL ETHMOIDECTOMY;  Surgeon: Clyde Canterbury, MD;  Location: [facility];  Service: ENT;  Laterality: Left;   FRONTAL SINUS EXPLORATION Left 09/06/2020    Procedure: FRONTAL SINUS EXPLORATION;  Surgeon: Clyde Canterbury, MD;  Location: [facility];  Service: ENT;  Laterality: Left;   IMAGE GUIDED SINUS SURGERY N/A 09/06/2020    Procedure: IMAGE GUIDED SINUS SURGERY;  Surgeon:  Clyde Canterbury, MD;  Location: [facility];  Service: ENT;  Laterality: N/A;  need stryker disk PLACE DISK ON OR CHARGE NURSE DESK 10-01 KP   JOINT REPLACEMENT       LUMBAR FUSION       MAXILLARY ANTROSTOMY Left 09/06/2020    Procedure: MAXILLARY ANTROSTOMY WITH TISSUE;  Surgeon: Clyde Canterbury, MD;  Location: [facility];  Service: ENT;  Laterality: Left;   MEDIAL PARTIAL KNEE REPLACEMENT Left     PARTIAL HYSTERECTOMY       SPHENOIDECTOMY Left 09/06/2020    Procedure: SPHENOIDECTOMY;  Surgeon: Clyde Canterbury, MD;  Location: [facility];  Service: ENT;  Laterality: Left;   TONSILLECTOMY       TOTAL KNEE REVISION Left 02/21/2021    Procedure: Revision, left partial to total knee;  Surgeon: Hessie Knows, MD;  Location: [facility];  Service: Orthopedics;  Laterality: Left;  CHRIS GAINES TO ASSIST   VENTRAL HERNIA REPAIR   2012    open ventral hernia repair with mesh      Home Medications:        Prior to Admission medications   Medication Sig Start Date End Date Taking? Authorizing Provider  docusate sodium (COLACE) 100 MG capsule Take 1 capsule (100 mg total) by mouth 2 (two) times daily. Patient taking differently: Take 100 mg  by mouth daily as needed. 02/23/21   Yes Duanne Guess, PA-C  FLUoxetine (PROZAC) 20 MG capsule TAKE 1 CAPSULE BY MOUTH DAILY 11/09/22   Yes Glean Hess, MD  gabapentin (NEURONTIN) 300 MG capsule Take 1 capsule by mouth at bedtime. 04/23/22   Yes [provider]  triamterene-hydrochlorothiazide (MAXZIDE-25) 37.5-25 MG tablet TAKE 1 TABLET BY MOUTH DAILY 10/05/22   Yes Glean Hess, MD  zolpidem (AMBIEN) 10 MG tablet Take 1 tablet (10 mg total) by mouth at bedtime. 11/06/22   Yes Glean Hess, MD      Allergies:     Allergies  Allergen Reactions   Hydrocodone Itching      Review of Systems: Review of Systems  Constitutional:  Negative for chills and fever.  HENT:  Negative for hearing loss.   Respiratory:   Negative for shortness of breath.   Cardiovascular:  Negative for chest pain.  Gastrointestinal:  Positive for abdominal pain. Negative for diarrhea, nausea and vomiting.  Genitourinary:  Negative for dysuria.  Musculoskeletal:  Negative for myalgias.  Skin:  Negative for rash.  Neurological:  Negative for dizziness.  Psychiatric/Behavioral:  Negative for depression.       Physical Exam BP 131/83   Pulse 66   Temp 97.7 F (36.5 C) (Oral)   Ht '5\' 6"'$  (1.676 m)   Wt 187 lb 3.2 oz (84.9 kg)   SpO2 96%   BMI 30.21 kg/m  CONSTITUTIONAL: No acute distress, well nourished. HEENT:  Normocephalic, atraumatic, extraocular motion intact. NECK:  Trachea is midline, no jugular venous distention. RESPIRATORY:  Lungs are clear, and breath sounds are equal bilaterally. Normal respiratory effort without pathologic use of accessory muscles. CARDIOVASCULAR: Heart is regular without murmurs, gallops, or rubs. GI: The abdomen is soft, non-distended, with discomfort to palpation at the site of her ventral hernia.  Midline incision is otherwise well healed.  MUSCULOSKELETAL:  Normal gait, no peripheral edema. NEUROLOGIC:  Motor and sensation is grossly normal.  Cranial nerves are grossly intact. PSYCH:  Alert and oriented to person, place and time. Affect is normal.   Labs/Imaging: CT abdomen/pelvis on 11/21/22: IMPRESSION: No acute findings within the abdomen or pelvis.   Colonic diverticulosis, without radiographic evidence of diverticulitis.   Stable small paraumbilical ventral hernia containing a small bowel loop. No evidence of bowel obstruction or strangulation.   Aortic Atherosclerosis (ICD10-I70.0).   Assessment and Plan: This is a 67 y.o. female with a recurrent incisional ventral hernia.   --Discussed with the patient the findings on her CT scan showing a recurrent incisional hernia.  The area is tender to palpation but appears to be reducible.  It is unclear on palpation how large the  defect is and it's hard to see if there's a true hernia defect vs weakness in the abdominal wall at the prior repair site.  However, it's focused in one area rather than the whole length, which would support a hernia recurrence.  I'm not able to see the mesh and whether it's displaced or not.   --In light of the CT scan findings and her symptoms, discussed with the patient that we can offer her a robotic assisted ventral hernia repair.  Reviewed the surgery at length with her including the planned incisions, the risks of bleeding, infection, injury to surrounding structures, that this would be an outpatient procedure, the use of mesh and types possible depending on the intraoperative findings, post-op pain control, activity restrictions, and she's willing to proceed. --Will schedule  her surgery for 2/29/234 based on patient's schedule preference.     Melvyn Neth, [facility]

## 2023-01-10 NOTE — Transfer of Care (Signed)
Immediate Anesthesia Transfer of Care Note  Patient: Tracy Chase  Procedure(s) Performed: XI ROBOTIC ASSISTED VENTRAL HERNIA WITH LYSIS OF ADHESIONS  Patient Location: PACU  Anesthesia Type:General  Level of Consciousness: awake, drowsy, and patient cooperative  Airway & Oxygen Therapy: Patient Spontanous Breathing  Post-op Assessment: Report given to RN and Post -op Vital signs reviewed and stable  Post vital signs: Reviewed and stable  Last Vitals:  Vitals Value Taken Time  BP 111/63 01/10/23 1103  Temp    Pulse 74 01/10/23 1107  Resp 14 01/10/23 1107  SpO2 97 % 01/10/23 1107  Vitals shown include unvalidated device data.  Last Pain:  Vitals:   01/10/23 0617  TempSrc: Temporal  PainSc: 0-No pain         Complications: No notable events documented.

## 2023-01-10 NOTE — Anesthesia Preprocedure Evaluation (Addendum)
Anesthesia Evaluation  Patient identified by MRN, date of birth, ID band Patient awake    Reviewed: Allergy & Precautions, H&P , NPO status , Patient's Chart, lab work & pertinent test results  History of Anesthesia Complications Negative for: history of anesthetic complications  Airway Mallampati: III  TM Distance: >3 FB Neck ROM: limited    Dental  (+) Chipped, Poor Dentition,    Pulmonary neg shortness of breath, former smoker   Pulmonary exam normal        Cardiovascular Exercise Tolerance: Good hypertension, Normal cardiovascular exam     Neuro/Psych  PSYCHIATRIC DISORDERS Anxiety Depression     Neuromuscular disease    GI/Hepatic Neg liver ROS,GERD  Medicated and Controlled,,  Endo/Other  negative endocrine ROS    Renal/GU Renal diseaseS/p RCC     Musculoskeletal   Abdominal  (+) + obese  Peds  Hematology negative hematology ROS (+)   Anesthesia Other Findings Past Medical History: No date: Arthritis No date: Cancer ([place])     Comment:  RENAL CELL No date: Depression No date: GERD (gastroesophageal reflux disease)     Comment:  OCC NO MEDS No date: Insomnia  Past Surgical History: No date: ABDOMINAL HYSTERECTOMY No date: APPENDECTOMY No date: BACK SURGERY     Comment:  "rods in back" 08/28/2019: COLONOSCOPY WITH PROPOFOL; N/A     Comment:  Procedure: COLONOSCOPY WITH PROPOFOL;  Surgeon:               Virgel Manifold, MD;  Location: [facility];                Service: Endoscopy;  Laterality: N/A; 2012: cryoablation for renal cell carcinoma 09/06/2020: ETHMOIDECTOMY; Left     Comment:  Procedure: TOTAL ETHMOIDECTOMY;  Surgeon: Clyde Canterbury,              MD;  Location: [place];  Service: ENT;                Laterality: Left; 09/06/2020: FRONTAL SINUS EXPLORATION; Left     Comment:  Procedure: FRONTAL SINUS EXPLORATION;  Surgeon: Clyde Canterbury, MD;  Location: [place];  Service: ENT;               Laterality: Left; 09/06/2020: IMAGE GUIDED SINUS SURGERY; N/A     Comment:  Procedure: IMAGE GUIDED SINUS SURGERY;  Surgeon:               Clyde Canterbury, MD;  Location: [place];                Service: ENT;  Laterality: N/A;  need stryker disk PLACE              DISK ON OR CHARGE NURSE DESK 10-01 KP No date: JOINT REPLACEMENT No date: LUMBAR FUSION 09/06/2020: MAXILLARY ANTROSTOMY; Left     Comment:  Procedure: MAXILLARY ANTROSTOMY WITH TISSUE;  Surgeon:               Clyde Canterbury, MD;  Location: [facility];                Service: ENT;  Laterality: Left; No date: MEDIAL PARTIAL KNEE REPLACEMENT; Left No date: PARTIAL HYSTERECTOMY 09/06/2020: SPHENOIDECTOMY; Left     Comment:  Procedure: SPHENOIDECTOMY;  Surgeon: Clyde Canterbury, MD;               Location: [facility];  Service: ENT;                Laterality: Left; No date: TONSILLECTOMY 2012: VENTRAL HERNIA REPAIR     Comment:  Dr. Genevive Bi     Reproductive/Obstetrics negative OB ROS                             Anesthesia Physical Anesthesia Plan  ASA: 2  Anesthesia Plan: General ETT   Post-op Pain Management: Regional block* and Tylenol PO (pre-op)*   Induction: Intravenous  PONV Risk Score and Plan: 4 or greater and Ondansetron, Dexamethasone, Midazolam, Treatment may vary due to age or medical condition and Droperidol  Airway Management Planned: Oral ETT  Additional Equipment:   Intra-op Plan:   Post-operative Plan: Extubation in OR  Informed Consent: I have reviewed the patients History and Physical, chart, labs and discussed the procedure including the risks, benefits and alternatives for the proposed anesthesia with the patient or authorized representative who has indicated his/her understanding and acceptance.     Dental Advisory Given  Plan Discussed with: Anesthesiologist, CRNA and Surgeon  Anesthesia Plan  Comments:         Anesthesia Quick Evaluation

## 2023-01-10 NOTE — Anesthesia Postprocedure Evaluation (Signed)
Anesthesia Post Note  Patient: Tracy Chase  Procedure(s) Performed: XI ROBOTIC ASSISTED VENTRAL HERNIA WITH LYSIS OF ADHESIONS  Patient location during evaluation: PACU Anesthesia Type: General Level of consciousness: awake and alert Pain management: pain level controlled Vital Signs Assessment: post-procedure vital signs reviewed and stable Respiratory status: spontaneous breathing, nonlabored ventilation and respiratory function stable Cardiovascular status: blood pressure returned to baseline and stable Postop Assessment: no apparent nausea or vomiting Anesthetic complications: no   No notable events documented.   Last Vitals:  Vitals:   01/10/23 1145 01/10/23 1153  BP: (!) 108/58 (!) 102/55  Pulse: 67 60  Resp: 16 18  Temp: (!) 36.3 C (!) 36.1 C  SpO2: 97% 99%    Last Pain:  Vitals:   01/10/23 1153  TempSrc: Temporal  PainSc: [place]

## 2023-01-10 NOTE — Anesthesia Procedure Notes (Signed)
Procedure Name: Intubation Date/Time: 01/10/2023 7:36 AM  Performed by: Kelton Pillar, CRNAPre-anesthesia Checklist: Patient identified, Emergency Drugs available, Suction available and Patient being monitored Patient Re-evaluated:Patient Re-evaluated prior to induction Oxygen Delivery Method: Circle system utilized Preoxygenation: Pre-oxygenation with 100% oxygen Induction Type: IV induction Ventilation: Mask ventilation without difficulty Laryngoscope Size: McGraph and 3 Grade View: Grade I Tube type: Oral Number of attempts: 1 Airway Equipment and Method: Stylet and Oral airway Placement Confirmation: ETT inserted through vocal cords under direct vision, positive ETCO2, breath sounds checked- equal and bilateral and CO2 detector Secured at: 21 cm Tube secured with: Tape Dental Injury: Teeth and Oropharynx as per pre-operative assessment

## 2023-01-10 NOTE — Discharge Instructions (Addendum)
Discharge Instructions: 1.  Patient may shower, but do not scrub wounds heavily and dab dry only. 2.  Do not submerge wounds in pool/tub until fully healed. 3.  Do not apply ointments or hydrogen peroxide to the wounds. 4.  May apply ice packs to the wounds for comfort. 5.  Please wear abdominal binder at all times for the next 4 weeks.  May remove for showers only. 6.  Do not drive while taking narcotics for pain control.  Prior to driving, make sure you are able to rotate right and left to look at blindspots without significant pain or discomfort. 7.  No heavy lifting or pushing of more than 10-15 lbs for 6 weeks.  AMBULATORY SURGERY  DISCHARGE INSTRUCTIONS   The drugs that you were given will stay in your system until tomorrow so for the next 24 hours you should not:  Drive an automobile Make any legal decisions Drink any alcoholic beverage   You may resume regular meals tomorrow.  Today it is better to start with liquids and gradually work up to solid foods.  You may eat anything you prefer, but it is better to start with liquids, then soup and crackers, and gradually work up to solid foods.   Please notify your doctor immediately if you have any unusual bleeding, trouble breathing, redness and pain at the surgery site, drainage, fever, or pain not relieved by medication.    Additional Instructions:        Please contact your physician with any problems or Same Day Surgery at [phone], Monday through Friday 6 am to 4 pm, or [place] [facility] number at [phone].

## 2023-01-10 NOTE — Op Note (Signed)
Procedure Date:  01/10/2023  Pre-operative Diagnosis:  Recurrent incisional ventral hernia  Post-operative Diagnosis: Recurrent reducible incisional ventral hernia, 10.5 cm length, extensive adhesions of abdominal wall to small bowel.  Procedure:  Robotic assisted recurrent incisional Hernia Repair with mesh and lysis of adhesions.  Surgeon:  Melvyn Neth, MD  Anesthesia:  General endotracheal  Estimated Blood Loss:  10 ml  Specimens:  None  Complications:  None  Indications for Procedure:  This is a 67 y.o. female who presents with a recurrent incisional hernia.  The options of surgery versus observation were reviewed with the patient and/or family. The risks of bleeding, abscess or infection, recurrence of symptoms, potential for an open procedure, injury to surrounding structures, and chronic pain were all discussed with the patient and was willing to proceed.  Description of Procedure: The patient was correctly identified in the preoperative area and brought into the operating room.  The patient was placed supine with VTE prophylaxis in place.  Appropriate time-outs were performed.  Anesthesia was induced and the patient was intubated.  Appropriate antibiotics were infused.  The abdomen was prepped and draped in a sterile fashion. The patient's hernia defect was marked with a marking pen.  A Veress needle was introduced in the left upper quadrant and pneumoperitoneum was obtained with appropriate pressures.  Using Optiview technique, an 8 mm port was introduced in the left lateral abdominal wall without complications.  Then, a 12 mm port was introduced in the left upper quadrant and an 8 mm port in the left lower quadrant under direct visualization.  The DaVinci platform was docked, camera targeted, and instruments placed under direct visualization.  The patient was found to have extensive adhesions of the small bowel to the anterior abdominal wall.  Some of these were thin and  some were very dense.  Adhesions were taken down with combination of sharp dissection and cautery, with careful attention not to injure the small bowel.  Adhesions between bowel loops were also taken down in order to decrease visible kinking that the small bowel was having.  This may be part of her pain symptoms.  Once all adhesions were taken down, the hernia had been fully reduced.  She was also noted to have mesh that was in good position in the superior portion of her prior incision, with the inferior portion having a hernia defect length of 10.5 cm.  A 4 x 6 inch Bard Ventralight ST Echo mesh, a two 0 Stratafix suture, and four 2-0 V-loc sutures were inserted through the 12 mm port under direct visualization.  The hernia defect was closed using the stratafix sutures in two layers to decrease the amount of tension with each layer.  A PMI was brought through the center of the hernia defect and the positioning system of the mesh was passed through.  This allowed the mesh to splay open and be centered over the repair site with good overlap.  The mesh was then sutured in place circumferentially and through the center of the mesh using the V-loc sutures.  All needles and the positioning system were then removed through the 12 mm port without complications.  The DaVinci platform was then undocked and instruments removed.    70 ml of Exparel solution mixed with 0.5% bupivacaine with epi was infiltrated around the mesh edges, hernia repair site, and port sites.  The 12 mm port was removed and the fascia was closed under direct visualization utilizing an Endo Close technique with 0 Vicryl  suture.  The 8 mm ports were removed. The 12 mm incision was closed using 3-0 Vicryl and 4-0 Monocryl, and the other port incisions were closed with 4-0 Monocryl.  The wounds were cleaned and sealed with DermaBond.  An abdominal binder was placed.  The patient was emerged from anesthesia and extubated and brought to the recovery  room for further management.  The patient tolerated the procedure well and all counts were correct at the end of the case.   Melvyn Neth, MD

## 2023-01-25 ENCOUNTER — Ambulatory Visit (INDEPENDENT_AMBULATORY_CARE_PROVIDER_SITE_OTHER): Payer: Medicare Other | Admitting: Surgery

## 2023-01-25 ENCOUNTER — Encounter: Payer: Self-pay | Admitting: Surgery

## 2023-01-25 VITALS — BP 121/83 | HR 61 | Temp 98.0°F | Ht 66.0 in | Wt 187.0 lb

## 2023-01-25 DIAGNOSIS — Z09 Encounter for follow-up examination after completed treatment for conditions other than malignant neoplasm: Secondary | ICD-10-CM | POA: Diagnosis not present

## 2023-01-25 DIAGNOSIS — K432 Incisional hernia without obstruction or gangrene: Secondary | ICD-10-CM

## 2023-01-25 NOTE — Patient Instructions (Signed)

## 2023-01-25 NOTE — Progress Notes (Signed)
01/25/2023  History of Present Illness: Tracy Chase is a 67 y.o. female s/p robotic assisted incisional hernia repair on 01/10/23.  Total defect length repaired was 10.5 cm.  Patient presents today for follow-up.  She reports that she has been doing very well and after some initial discomfort she is no longer needing any pain medication.  She is wearing her abdominal binder but reports that it is a little bit uncomfortable.  Any worsening pain, nausea, vomiting.  She did have constipation for about a week after surgery but this has since resolved.  Past Medical History: Past Medical History:  Diagnosis Date   Anemia    h/o   Aortic atherosclerosis (HCC)    Arthritis    Cancer ([place])    RENAL CELL   Depression    GERD (gastroesophageal reflux disease)    OCC NO MEDS   Hypertension    Incisional hernia    Insomnia    Sepsis ([place])      Past Surgical History: Past Surgical History:  Procedure Laterality Date   ABDOMINAL HYSTERECTOMY     APPENDECTOMY  2011   Lap converted to exlap appendectomy, drainage of abd abscesses   CESAREAN SECTION     COLONOSCOPY WITH PROPOFOL N/A 08/28/2019   Procedure: COLONOSCOPY WITH PROPOFOL;  Surgeon: Virgel Manifold, MD;  Location: [facility];  Service: Endoscopy;  Laterality: N/A;   cryoablation for renal cell carcinoma  2012   ETHMOIDECTOMY Left 09/06/2020   Procedure: TOTAL ETHMOIDECTOMY;  Surgeon: Clyde Canterbury, MD;  Location: [place];  Service: ENT;  Laterality: Left;   FRONTAL SINUS EXPLORATION Left 09/06/2020   Procedure: FRONTAL SINUS EXPLORATION;  Surgeon: Clyde Canterbury, MD;  Location: [place];  Service: ENT;  Laterality: Left;   IMAGE GUIDED SINUS SURGERY N/A 09/06/2020   Procedure: IMAGE GUIDED SINUS SURGERY;  Surgeon: Clyde Canterbury, MD;  Location: [place];  Service: ENT;  Laterality: N/A;  need stryker disk PLACE DISK ON OR CHARGE NURSE DESK 10-01 KP   INSERTION OF MESH  01/10/2023    Procedure: INSERTION OF MESH;  Surgeon: Olean Ree, MD;  Location: [facility];  Service: General;;   JOINT REPLACEMENT Left    partial knee replacement   LUMBAR FUSION  1994   MAXILLARY ANTROSTOMY Left 09/06/2020   Procedure: MAXILLARY ANTROSTOMY WITH TISSUE;  Surgeon: Clyde Canterbury, MD;  Location: [place];  Service: ENT;  Laterality: Left;   MEDIAL PARTIAL KNEE REPLACEMENT Left    SPHENOIDECTOMY Left 09/06/2020   Procedure: SPHENOIDECTOMY;  Surgeon: Clyde Canterbury, MD;  Location: [place];  Service: ENT;  Laterality: Left;   TONSILLECTOMY     TOTAL KNEE REVISION Left 02/21/2021   Procedure: Revision, left partial to total knee;  Surgeon: Hessie Knows, MD;  Location: [facility];  Service: Orthopedics;  Laterality: Left;  CHRIS GAINES TO ASSIST   VENTRAL HERNIA REPAIR  2012   open ventral hernia repair with mesh   XI ROBOTIC ASSISTED VENTRAL HERNIA N/A 01/10/2023   Procedure: XI ROBOTIC ASSISTED VENTRAL HERNIA WITH LYSIS OF ADHESIONS;  Surgeon: Olean Ree, MD;  Location: [facility];  Service: General;  Laterality: N/A;    Home Medications: Prior to Admission medications   Medication Sig Start Date End Date Taking? Authorizing Provider  acetaminophen (TYLENOL) 500 MG tablet Take 2 tablets (1,000 mg total) by mouth every 6 (six) hours as needed for mild pain. 01/10/23  Yes Casady Voshell, Jacqulyn Bath, MD  docusate sodium (COLACE) 100 MG capsule Take  1 capsule (100 mg total) by mouth 2 (two) times daily. Patient taking differently: Take 100 mg by mouth daily as needed. 02/23/21  Yes Duanne Guess, PA-C  FLUoxetine (PROZAC) 20 MG capsule TAKE 1 CAPSULE BY MOUTH DAILY Patient taking differently: Take 20 mg by mouth every morning. 11/09/22  Yes Glean Hess, MD  gabapentin (NEURONTIN) 300 MG capsule Take 1 capsule by mouth at bedtime. 04/23/22  Yes [provider]  ibuprofen (ADVIL) 600 MG tablet Take 1 tablet (600 mg total) by mouth every 8 (eight) hours as needed for  moderate pain. 01/10/23  Yes Daniah Zaldivar, Jacqulyn Bath, MD  triamterene-hydrochlorothiazide (MAXZIDE-25) 37.5-25 MG tablet TAKE 1 TABLET BY MOUTH DAILY Patient taking differently: Take 1 tablet by mouth as needed. 10/05/22  Yes Glean Hess, MD  zolpidem (AMBIEN) 10 MG tablet Take 1 tablet (10 mg total) by mouth at bedtime. 11/06/22  Yes Glean Hess, MD    Allergies: Allergies  Allergen Reactions   Hydrocodone Itching    Review of Systems: Review of Systems  Constitutional:  Negative for chills and fever.  Respiratory:  Negative for shortness of breath.   Cardiovascular:  Negative for chest pain.  Gastrointestinal:  Positive for constipation. Negative for abdominal pain, nausea and vomiting.    Physical Exam BP 121/83   Pulse 61   Temp 98 F (36.7 C)   Ht 5\' 6"  (1.676 m)   Wt 187 lb (84.8 kg)   SpO2 97%   BMI 30.18 kg/m  CONSTITUTIONAL: No acute distress HEENT:  Normocephalic, atraumatic, extraocular motion intact. RESPIRATORY:  Normal respiratory effort without pathologic use of accessory muscles. CARDIOVASCULAR: Regular rhythm and rate. GI: The abdomen is soft, nondistended, nontender to palpation.  Incisions are healing well and are clean, dry, intact with some mild ecchymosis.  Lower midline area without any recurrence of hernia. NEUROLOGIC:  Motor and sensation is grossly normal.  Cranial nerves are grossly intact. PSYCH:  Alert and oriented to person, place and time. Affect is normal.   Assessment and Plan: This is a 67 y.o. female status post robotic assisted incisional hernia repair.  - Discussed with patient the findings in the operating room with her inferior incisional hernia, the length repaired, and the use of mesh.  Patient is healing well without any pain issues at this point and without any evidence of hernia. - Discussed with her the recommendation for wearing the abdominal binder over the next 2 more weeks to complete 4 weeks after surgery to decrease some  of the stress or tension on the sutures over the mesh while everything is healing. - Discussed also activity restrictions. - Follow-up as needed  I spent 20 minutes dedicated to the care of this patient on the date of this encounter to include pre-visit review of records, face-to-face time with the patient discussing diagnosis and management, and any post-visit coordination of care.   Melvyn Neth, [facility]

## 2023-02-20 ENCOUNTER — Other Ambulatory Visit: Payer: Self-pay | Admitting: Thoracic Surgery (Cardiothoracic Vascular Surgery)

## 2023-02-20 DIAGNOSIS — M899 Disorder of bone, unspecified: Secondary | ICD-10-CM

## 2023-03-15 ENCOUNTER — Other Ambulatory Visit: Payer: Self-pay | Admitting: Internal Medicine

## 2023-03-15 DIAGNOSIS — I10 Essential (primary) hypertension: Secondary | ICD-10-CM

## 2023-03-18 NOTE — Telephone Encounter (Signed)
Requested Prescriptions  Pending Prescriptions Disp Refills   triamterene-hydrochlorothiazide (MAXZIDE-25) 37.5-25 MG tablet [Pharmacy Med Name: Triamterene-HCTZ 37.5-25 MG Oral Tablet] 100 tablet 0    Sig: TAKE 1 TABLET BY MOUTH DAILY     Cardiovascular: Diuretic Combos Passed - 03/15/2023 10:05 PM      Passed - K in normal range and within 180 days    Potassium  Date Value Ref Range Status  01/08/2023 3.8 3.5 - 5.1 mmol/L Final  05/11/2014 3.5 3.5 - 5.1 mmol/L Final         Passed - Na in normal range and within 180 days    Sodium  Date Value Ref Range Status  01/08/2023 139 135 - 145 mmol/L Final  11/06/2022 142 134 - 144 mmol/L Final  05/11/2014 137 136 - 145 mmol/L Final         Passed - Cr in normal range and within 180 days    Creatinine  Date Value Ref Range Status  05/11/2014 1.02 0.60 - 1.30 mg/dL Final   Creatinine, Ser  Date Value Ref Range Status  01/08/2023 0.68 0.44 - 1.00 mg/dL Final         Passed - Last BP in normal range    BP Readings from Last 1 Encounters:  01/25/23 121/83         Passed - Valid encounter within last 6 months    Recent Outpatient Visits           2 months ago Essential (primary) hypertension   [place], Nyoka Cowden, MD   4 months ago Annual physical exam   [facility] at [facility], Nyoka Cowden, MD   7 months ago Acute right-sided thoracic back pain   [place] [facility] at [facility], Nyoka Cowden, MD   10 months ago Essential (primary) hypertension   [place] [facility] at [facility], Nyoka Cowden, MD   1 year ago Annual physical exam   [facility] at [facility], Nyoka Cowden, MD       Future Appointments             In 2 months [name], Nyoka Cowden, MD [facility] at [facility], [facility]

## 2023-03-25 ENCOUNTER — Ambulatory Visit: Payer: Medicare Other | Admitting: Internal Medicine

## 2023-03-26 ENCOUNTER — Ambulatory Visit: Payer: Medicare Other | Admitting: Thoracic Surgery (Cardiothoracic Vascular Surgery)

## 2023-03-26 ENCOUNTER — Encounter: Payer: Self-pay | Admitting: Thoracic Surgery (Cardiothoracic Vascular Surgery)

## 2023-03-26 ENCOUNTER — Ambulatory Visit
Admission: RE | Admit: 2023-03-26 | Discharge: 2023-03-26 | Disposition: A | Discharge status: Home / Self Care | Payer: Medicare Other | Source: Ambulatory Visit | Attending: Thoracic Surgery (Cardiothoracic Vascular Surgery) | Admitting: Thoracic Surgery (Cardiothoracic Vascular Surgery)

## 2023-03-26 VITALS — BP 118/79 | HR 69 | Resp 20 | Ht 66.0 in

## 2023-03-26 DIAGNOSIS — M899 Disorder of bone, unspecified: Secondary | ICD-10-CM | POA: Diagnosis not present

## 2023-03-26 DIAGNOSIS — M9988 Other biomechanical lesions of rib cage: Secondary | ICD-10-CM | POA: Diagnosis not present

## 2023-03-26 NOTE — Progress Notes (Signed)
[address]       [place] [address]             [phone]     HPI: Tracy Chase returns for follow-up of a fourth rib mass  Tracy Chase is a 67 year old woman with a history of renal cell carcinoma, arthritis, reflux, anxiety, depression, and a mass of the right fourth rib.  She presented with intermittent stabbing pain in the right upper back and in the right anterior chest.  CT of the chest showed a 3 x 2.1 x 2 cm expansile lesion of the right fourth rib.  On MRI it was felt to be indeterminate but probably benign.  I saw her in November.  After discussing the options of resection versus radiographic observation, she opted for radiographic observation.  In the interim since her last visit she is continued to have sharp anterior right chest wall pain.  Still taking gabapentin 300 mg nightly.  Past Medical History:  Diagnosis Date   Anemia    h/o   Aortic atherosclerosis (HCC)    Arthritis    Cancer (HCC)    RENAL CELL   Depression    GERD (gastroesophageal reflux disease)    OCC NO MEDS   Hypertension    Incisional hernia    Insomnia    Rib lesion    3 x 2.1 x 2 cm expansile right fourth rib posteriorly   Sepsis (HCC)     Current Outpatient Medications  Medication Sig Dispense Refill   docusate sodium (COLACE) 100 MG capsule Take 1 capsule (100 mg total) by mouth 2 (two) times daily. (Patient taking differently: Take 100 mg by mouth daily as needed.) 10 capsule 0   FLUoxetine (PROZAC) 20 MG capsule TAKE 1 CAPSULE BY MOUTH DAILY (Patient taking differently: Take 20 mg by mouth every morning.) 90 capsule 3   gabapentin (NEURONTIN) 300 MG capsule Take 1 capsule by mouth at bedtime.     triamterene-hydrochlorothiazide (MAXZIDE-25) 37.5-25 MG tablet TAKE 1 TABLET BY MOUTH DAILY 100 tablet 0   zolpidem (AMBIEN) 10 MG tablet Take 1 tablet (10 mg total) by mouth at bedtime. 90 tablet 1   No current facility-administered medications for this visit.     Physical Exam BP 118/79   Pulse 69   Resp 20   Ht 5\' 6"  (1.676 m)   SpO2 99% Comment: RA  BMI 30.48 kg/m  67 year old woman in no acute distress Alert and oriented x 3 with no focal deficits Lungs clear bilaterally Cardiac regular rate and rhythm Mild tenderness to palpation right paraspinous area  Diagnostic Tests: CT CHEST WITHOUT CONTRAST   TECHNIQUE: Multidetector CT imaging of the chest was performed following the standard protocol without IV contrast.   RADIATION DOSE REDUCTION: This exam was performed according to the departmental dose-optimization program which includes automated exposure control, adjustment of the mA and/or kV according to patient size and/or use of iterative reconstruction technique.   COMPARISON:  MR thoracic spine 08/06/2022, CT chest 07/31/2022.   FINDINGS: Cardiovascular: Atherosclerotic calcification of the aorta and aortic valve. Heart is at the upper limits of normal in size. No pericardial effusion.   Mediastinum/Nodes: No pathologically enlarged mediastinal or axillary lymph nodes. Hilar regions are difficult to definitively evaluate without IV contrast. Esophagus is grossly unremarkable.   Lungs/Pleura: New areas of patchy amorphous peribronchovascular ground-glass in the right lung. Calcified granulomas. Subpleural scarring in the medial aspects of both lower lobes, right greater than  left. No pleural fluid. Airway is unremarkable.   Upper Abdomen: Visualized portions of the liver, gallbladder, adrenal glands and right kidney are unremarkable. 2.4 cm hyperdense lesion off the left kidney, incompletely visualized. No specific follow-up necessary. Visualized portions of the spleen, pancreas and stomach are grossly unremarkable. Duodenal diverticulum. No upper abdominal adenopathy.   Musculoskeletal: Degenerative changes in the spine. Postoperative changes in the thoracolumbar spine. 2.3 x 3.2 cm well-circumscribed expansile  lucent lesion in the right fourth posteromedial rib is unchanged from 07/31/2022. No new worrisome lytic or sclerotic lesions.   IMPRESSION: 1. Well-circumscribed expansile lucent lesion in the posteromedial right fourth rib, unchanged from 07/30/2022 and favored to be benign, as on comparison imaging. If further evaluation is desired, bone scan is suggested. 2. New areas of patchy amorphous peribronchovascular ground-glass in the right lung, infectious/inflammatory in etiology. 3.  Aortic atherosclerosis (ICD10-I70.0).     Electronically Signed   By: Leanna Battles M.D.   On: 03/26/2023 13:59 I personally reviewed the CT images.  No change in the right fourth rib mass.  Impression: Tracy Chase is a 67 year old woman with a history of renal cell carcinoma, arthritis, reflux, anxiety, depression, and a mass of the right fourth rib.    Expansile lesion right fourth rib-unchanged over 6 months.  Will plan to get another CT in in 6 months for a 1 year follow-up.  If remains stable may not need additional follow-up after that.  Reviewing her chest x-ray from 2016 appears to be present at that time.  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  Plan: Return in 6 months with CT chest  Loreli Slot, MD [facility] [phone]

## 2023-04-01 DIAGNOSIS — R300 Dysuria: Secondary | ICD-10-CM | POA: Diagnosis not present

## 2023-04-01 DIAGNOSIS — N3000 Acute cystitis without hematuria: Secondary | ICD-10-CM | POA: Diagnosis not present

## 2023-04-01 DIAGNOSIS — R1084 Generalized abdominal pain: Secondary | ICD-10-CM | POA: Diagnosis not present

## 2023-04-01 DIAGNOSIS — I10 Essential (primary) hypertension: Secondary | ICD-10-CM | POA: Diagnosis not present

## 2023-04-01 DIAGNOSIS — G8929 Other chronic pain: Secondary | ICD-10-CM | POA: Diagnosis not present

## 2023-04-15 ENCOUNTER — Other Ambulatory Visit: Payer: Self-pay | Admitting: Internal Medicine

## 2023-04-15 DIAGNOSIS — F5101 Primary insomnia: Secondary | ICD-10-CM

## 2023-05-06 ENCOUNTER — Ambulatory Visit: Payer: Medicare Other | Admitting: Internal Medicine

## 2023-05-20 ENCOUNTER — Ambulatory Visit (INDEPENDENT_AMBULATORY_CARE_PROVIDER_SITE_OTHER): Payer: Medicare Other | Admitting: Internal Medicine

## 2023-05-20 ENCOUNTER — Encounter: Payer: Self-pay | Admitting: Internal Medicine

## 2023-05-20 VITALS — BP 124/60 | HR 57 | Ht 66.0 in | Wt 185.4 lb

## 2023-05-20 DIAGNOSIS — M25561 Pain in right knee: Secondary | ICD-10-CM | POA: Diagnosis not present

## 2023-05-20 DIAGNOSIS — F411 Generalized anxiety disorder: Secondary | ICD-10-CM

## 2023-05-20 DIAGNOSIS — I10 Essential (primary) hypertension: Secondary | ICD-10-CM

## 2023-05-20 MED ORDER — TRIAMTERENE-HCTZ 37.5-25 MG PO TABS: 1.0000 | ORAL_TABLET | Freq: Every day | ORAL | 3 refills | Status: DC

## 2023-05-20 MED ORDER — ALPRAZOLAM 0.25 MG PO TABS
0.25 mg | ORAL_TABLET | Freq: Two times a day (BID) | ORAL | 0 refills | Status: AC | PRN
Start: 2023-05-20 — End: ?

## 2023-05-20 NOTE — Assessment & Plan Note (Signed)
Clinically stable on current regimen with good control of symptoms, No SI or HI. She is having more anxiety and irritability since moving her 69 yr old father in. Will try Xanax 0.25 daily prn. Continue Prozac 20 mg daily.

## 2023-05-20 NOTE — Assessment & Plan Note (Signed)
Stable exam with well controlled BP.  Currently taking Maxzide. Tolerating medications without concerns or side effects. Recent hernia repair went well with no complications. Will continue to recommend low sodium diet and current regimen.

## 2023-05-20 NOTE — Progress Notes (Signed)
Date:  05/19/2068   Name:  Tracy Chase   DOB:  [birth year]   MRN:  [id number]   Chief Complaint: Hypertension  Hypertension This is a chronic problem. The problem is controlled. Associated symptoms include anxiety. Pertinent negatives include no chest pain, headaches, palpitations or shortness of breath. Past treatments include diuretics. The current treatment provides significant improvement.  Anxiety Presents for follow-up visit. Symptoms include irritability and nervous/anxious behavior. Patient reports no chest pain, dizziness, palpitations or shortness of breath. Episode frequency: 3-4 days per week. The severity of symptoms is moderate. The quality of sleep is good.   Compliance with medications is 76-100%.  Knee Pain  There was no injury mechanism. The pain is present in the right knee. Quality: fullness posteriorly - minimal pain; comes and goes. The pain is mild. The pain has been Intermittent since onset.    Lab Results  Component Value Date   NA 139 01/08/2023   K 3.8 01/08/2023   CO2 27 01/08/2023   GLUCOSE 94 01/08/2023   BUN 10 01/08/2023   CREATININE 0.68 01/08/2023   CALCIUM 8.8 (L) 01/08/2023   EGFR 79 11/06/2022   GFRNONAA >60 01/08/2023   Lab Results  Component Value Date   CHOL 219 (H) 11/06/2022   HDL 95 11/06/2022   LDLCALC 112 (H) 11/06/2022   TRIG 66 11/06/2022   CHOLHDL 2.3 11/06/2022   Lab Results  Component Value Date   TSH 1.620 11/06/2022   Lab Results  Component Value Date   HGBA1C 5.3 11/01/2021   Lab Results  Component Value Date   WBC 4.7 11/06/2022   HGB 13.4 11/06/2022   HCT 40.7 11/06/2022   MCV 92 11/06/2022   PLT 208 11/06/2022   Lab Results  Component Value Date   ALT 16 11/06/2022   AST 19 11/06/2022   ALKPHOS 62 11/06/2022   BILITOT 0.4 11/06/2022   No results found for: "25OHVITD2", "25OHVITD3", "VD25OH"   Review of Systems  Constitutional:  Positive for irritability. Negative for fatigue and unexpected weight  change.  HENT:  Negative for nosebleeds and trouble swallowing.   Eyes:  Negative for visual disturbance.  Respiratory:  Negative for cough, chest tightness, shortness of breath and wheezing.   Cardiovascular:  Negative for chest pain, palpitations and leg swelling.  Gastrointestinal:  Negative for abdominal pain, constipation and diarrhea.  Musculoskeletal:  Positive for arthralgias.  Neurological:  Negative for dizziness, weakness, light-headedness and headaches.  Psychiatric/Behavioral:  Negative for dysphoric mood and sleep disturbance. The patient is nervous/anxious.     Patient Active Problem List   Diagnosis Date Noted   Recurrent incisional hernia 01/10/2023   Rib lesion 08/09/2022   Trigger index finger of right hand 11/01/2021   Atherosclerosis of aorta (HCC) 10/17/2021   S/P TKR (total knee replacement) using cement, left 02/21/2021   Mixed hyperlipidemia 09/25/2019   Encounter for screening colonoscopy    Polyp of colon    Degenerative disc disease, cervical 11/18/2017   Generalized anxiety disorder 06/21/2015   History of renal cell cancer 06/21/2015   Carpal tunnel syndrome 06/21/2015   Essential (primary) hypertension 06/21/2015   Idiopathic insomnia 06/21/2015    Allergies  Allergen Reactions   Hydrocodone Itching    Past Surgical History:  Procedure Laterality Date   ABDOMINAL HYSTERECTOMY     APPENDECTOMY  2011   Lap converted to exlap appendectomy, drainage of abd abscesses   CESAREAN SECTION     COLONOSCOPY WITH PROPOFOL N/A 08/28/2019  Procedure: COLONOSCOPY WITH PROPOFOL;  Surgeon: Pasty Spillers, MD;  Location: [facility];  Service: Endoscopy;  Laterality: N/A;   cryoablation for renal cell carcinoma  2012   ETHMOIDECTOMY Left 09/06/2020   Procedure: TOTAL ETHMOIDECTOMY;  Surgeon: Geanie Logan, MD;  Location: [facility];  Service: ENT;  Laterality: Left;   FRONTAL SINUS EXPLORATION Left 09/06/2020   Procedure: FRONTAL SINUS  EXPLORATION;  Surgeon: Geanie Logan, MD;  Location: [facility];  Service: ENT;  Laterality: Left;   IMAGE GUIDED SINUS SURGERY N/A 09/06/2020   Procedure: IMAGE GUIDED SINUS SURGERY;  Surgeon: Geanie Logan, MD;  Location: [facility];  Service: ENT;  Laterality: N/A;  need stryker disk PLACE DISK ON OR CHARGE NURSE DESK 10-01 KP   INSERTION OF MESH  01/10/2023   Procedure: INSERTION OF MESH;  Surgeon: Henrene Dodge, MD;  Location: [facility];  Service: General;;   JOINT REPLACEMENT Left    partial knee replacement   LUMBAR FUSION  1994   MAXILLARY ANTROSTOMY Left 09/06/2020   Procedure: MAXILLARY ANTROSTOMY WITH TISSUE;  Surgeon: Geanie Logan, MD;  Location: [facility];  Service: ENT;  Laterality: Left;   MEDIAL PARTIAL KNEE REPLACEMENT Left    SPHENOIDECTOMY Left 09/06/2020   Procedure: SPHENOIDECTOMY;  Surgeon: Geanie Logan, MD;  Location: [facility];  Service: ENT;  Laterality: Left;   TONSILLECTOMY     TOTAL KNEE REVISION Left 02/21/2021   Procedure: Revision, left partial to total knee;  Surgeon: Kennedy Bucker, MD;  Location: [facility];  Service: Orthopedics;  Laterality: Left;  CHRIS GAINES TO ASSIST   VENTRAL HERNIA REPAIR  2012   open ventral hernia repair with mesh   XI ROBOTIC ASSISTED VENTRAL HERNIA N/A 01/10/2023   Procedure: XI ROBOTIC ASSISTED VENTRAL HERNIA WITH LYSIS OF ADHESIONS;  Surgeon: Henrene Dodge, MD;  Location: [facility];  Service: General;  Laterality: N/A;    Social History   Tobacco Use   Smoking status: Former    Types: Cigarettes    Quit date: 1990    Years since quitting: 34.5    Passive exposure: Past   Smokeless tobacco: Never   Tobacco comments:    was "social Smoker" over 15 yrs ago  Vaping Use   Vaping Use: Never used  Substance Use Topics   Alcohol use: Yes    Comment: 2-3 times weekly   Drug use: No     Medication list has been reviewed and updated.  Current Meds  Medication Sig   ALPRAZolam (XANAX)  0.25 MG tablet Take 1 tablet (0.25 mg total) by mouth 2 (two) times daily as needed for anxiety.   docusate sodium (COLACE) 100 MG capsule Take 1 capsule (100 mg total) by mouth 2 (two) times daily. (Patient taking differently: Take 100 mg by mouth daily as needed.)   FLUoxetine (PROZAC) 20 MG capsule TAKE 1 CAPSULE BY MOUTH DAILY (Patient taking differently: Take 20 mg by mouth every morning.)   gabapentin (NEURONTIN) 300 MG capsule Take 1 capsule by mouth at bedtime.   zolpidem (AMBIEN) 10 MG tablet TAKE 1 TABLET BY MOUTH AT  BEDTIME   [DISCONTINUED] triamterene-hydrochlorothiazide (MAXZIDE-25) 37.5-25 MG tablet TAKE 1 TABLET BY MOUTH DAILY       05/20/2023    1:24 PM 12/25/2022    8:40 AM 11/06/2022   10:05 AM 07/27/2022    3:37 PM  GAD 7 : Generalized Anxiety Score  Nervous, Anxious, on Edge 3 2 1 2   Control/stop worrying 3 2  3 2  Worry too much - different things 3 2 2 2   Trouble relaxing 0 2 1 2   Restless 0 2 1 2   Easily annoyed or irritable 2 2 0 0  Afraid - awful might happen 0 2 2 0  Total GAD 7 Score 11 14 10 10   Anxiety Difficulty Not difficult at all Not difficult at all Not difficult at all Not difficult at all       05/20/2023    1:24 PM 12/25/2022    8:40 AM 11/06/2022   10:05 AM  Depression screen PHQ 2/9  Decreased Interest 0 0 0  Down, Depressed, Hopeless 0 0 1  PHQ - 2 Score 0 0 1  Altered sleeping 0 1 0  Tired, decreased energy 0 0 0  Change in appetite 0 0 0  Feeling bad or failure about yourself  0 0 0  Trouble concentrating 0 0 0  Moving slowly or fidgety/restless 0 0 0  Suicidal thoughts 0 0 0  PHQ-9 Score 0 1 1  Difficult doing work/chores Not difficult at all Not difficult at all Not difficult at all    BP Readings from Last 3 Encounters:  05/20/23 124/60  03/26/23 118/79  01/25/23 121/83    Physical Exam Vitals and nursing note reviewed.  Constitutional:      General: She is not in acute distress.    Appearance: She is well-developed.   HENT:     Head: Normocephalic and atraumatic.  Neck:     Vascular: No carotid bruit.  Cardiovascular:     Rate and Rhythm: Normal rate and regular rhythm.  Pulmonary:     Effort: Pulmonary effort is normal. No respiratory distress.     Breath sounds: No wheezing or rhonchi.  Musculoskeletal:     Right knee: No swelling, deformity, effusion or bony tenderness. Normal range of motion. No tenderness.     Right lower leg: No edema.     Left lower leg: No edema.  Lymphadenopathy:     Cervical: No cervical adenopathy.  Skin:    General: Skin is warm and dry.     Findings: No rash.  Neurological:     Mental Status: She is alert and oriented to person, place, and time.  Psychiatric:        Mood and Affect: Mood normal.        Behavior: Behavior normal.     Wt Readings from Last 3 Encounters:  05/20/23 185 lb 6.4 oz (84.1 kg)  01/25/23 187 lb (84.8 kg)  01/10/23 191 lb (86.6 kg)    BP 124/60   Pulse (!) 57   Ht 5\' 6"  (1.676 m)   Wt 185 lb 6.4 oz (84.1 kg)   SpO2 99%   BMI 29.92 kg/m   Assessment and Plan:  Problem List Items Addressed This Visit     Generalized anxiety disorder (Chronic)    Clinically stable on current regimen with good control of symptoms, No SI or HI. She is having more anxiety and irritability since moving her 36 yr old father in. Will try Xanax 0.25 daily prn. Continue Prozac 20 mg daily.       Relevant Medications   ALPRAZolam (XANAX) 0.25 MG tablet   Essential (primary) hypertension - Primary (Chronic)    Stable exam with well controlled BP.  Currently taking Maxzide. Tolerating medications without concerns or side effects. Recent hernia repair went well with no complications. Will continue to recommend low sodium diet and current  regimen.       Relevant Medications   triamterene-hydrochlorothiazide (MAXZIDE-25) 37.5-25 MG tablet   Other Visit Diagnoses     Posterior knee pain, right       suspect Baker's cyst that is transient no  specific treatment is needed       Return in about 6 months (around 11/20/2023) for CPX.   Partially dictated using Dragon software, any errors are not intentional.  Reubin Milan, MD [facility] [place], [place]

## 2023-05-28 ENCOUNTER — Encounter: Payer: Self-pay | Admitting: Internal Medicine

## 2023-05-28 ENCOUNTER — Ambulatory Visit: Payer: Self-pay

## 2023-05-28 ENCOUNTER — Ambulatory Visit (INDEPENDENT_AMBULATORY_CARE_PROVIDER_SITE_OTHER): Payer: Medicare Other | Admitting: Internal Medicine

## 2023-05-28 ENCOUNTER — Other Ambulatory Visit
Admission: RE | Admit: 2023-05-28 | Discharge: 2023-05-28 | Disposition: A | Discharge status: Home / Self Care | Payer: Medicare Other | Source: Home / Self Care | Attending: Internal Medicine | Admitting: Internal Medicine

## 2023-05-28 VITALS — BP 124/70 | HR 85 | Ht 66.0 in | Wt 182.0 lb

## 2023-05-28 DIAGNOSIS — K265 Chronic or unspecified duodenal ulcer with perforation: Secondary | ICD-10-CM | POA: Diagnosis not present

## 2023-05-28 DIAGNOSIS — Z8249 Family history of ischemic heart disease and other diseases of the circulatory system: Secondary | ICD-10-CM | POA: Diagnosis not present

## 2023-05-28 DIAGNOSIS — N281 Cyst of kidney, acquired: Secondary | ICD-10-CM | POA: Diagnosis not present

## 2023-05-28 DIAGNOSIS — K769 Liver disease, unspecified: Secondary | ICD-10-CM | POA: Diagnosis not present

## 2023-05-28 DIAGNOSIS — R1011 Right upper quadrant pain: Secondary | ICD-10-CM | POA: Insufficient documentation

## 2023-05-28 DIAGNOSIS — Z981 Arthrodesis status: Secondary | ICD-10-CM | POA: Diagnosis not present

## 2023-05-28 DIAGNOSIS — M549 Dorsalgia, unspecified: Secondary | ICD-10-CM | POA: Diagnosis not present

## 2023-05-28 DIAGNOSIS — K219 Gastro-esophageal reflux disease without esophagitis: Secondary | ICD-10-CM | POA: Diagnosis not present

## 2023-05-28 DIAGNOSIS — Z885 Allergy status to narcotic agent status: Secondary | ICD-10-CM | POA: Diagnosis not present

## 2023-05-28 DIAGNOSIS — D72819 Decreased white blood cell count, unspecified: Secondary | ICD-10-CM | POA: Diagnosis not present

## 2023-05-28 DIAGNOSIS — R109 Unspecified abdominal pain: Secondary | ICD-10-CM | POA: Diagnosis not present

## 2023-05-28 DIAGNOSIS — Z96652 Presence of left artificial knee joint: Secondary | ICD-10-CM | POA: Diagnosis not present

## 2023-05-28 DIAGNOSIS — K5750 Diverticulosis of both small and large intestine without perforation or abscess without bleeding: Secondary | ICD-10-CM | POA: Diagnosis not present

## 2023-05-28 DIAGNOSIS — R1084 Generalized abdominal pain: Secondary | ICD-10-CM | POA: Diagnosis not present

## 2023-05-28 DIAGNOSIS — K5700 Diverticulitis of small intestine with perforation and abscess without bleeding: Secondary | ICD-10-CM | POA: Diagnosis not present

## 2023-05-28 DIAGNOSIS — Z85528 Personal history of other malignant neoplasm of kidney: Secondary | ICD-10-CM | POA: Diagnosis not present

## 2023-05-28 DIAGNOSIS — I10 Essential (primary) hypertension: Secondary | ICD-10-CM | POA: Diagnosis not present

## 2023-05-28 DIAGNOSIS — Z87891 Personal history of nicotine dependence: Secondary | ICD-10-CM | POA: Diagnosis not present

## 2023-05-28 DIAGNOSIS — Z79899 Other long term (current) drug therapy: Secondary | ICD-10-CM | POA: Diagnosis not present

## 2023-05-28 DIAGNOSIS — Z841 Family history of disorders of kidney and ureter: Secondary | ICD-10-CM | POA: Diagnosis not present

## 2023-05-28 DIAGNOSIS — K824 Cholesterolosis of gallbladder: Secondary | ICD-10-CM | POA: Diagnosis not present

## 2023-05-28 LAB — CBC WITH DIFFERENTIAL/PLATELET
Abs Immature Granulocytes: 0.02 10*3/uL (ref 0.00–0.07)
Basophils Absolute: 0 10*3/uL (ref 0.0–0.1)
Basophils Relative: 0 %
Eosinophils Absolute: 0 10*3/uL (ref 0.0–0.5)
Eosinophils Relative: 1 %
HCT: 35.1 % — ABNORMAL LOW (ref 36.0–46.0)
Hemoglobin: 12.1 g/dL (ref 12.0–15.0)
Immature Granulocytes: 0 %
Lymphocytes Relative: 22 %
Lymphs Abs: 1.8 10*3/uL (ref 0.7–4.0)
MCH: 31.3 pg (ref 26.0–34.0)
MCHC: 34.5 g/dL (ref 30.0–36.0)
MCV: 90.9 fL (ref 80.0–100.0)
Monocytes Absolute: 0.7 10*3/uL (ref 0.1–1.0)
Monocytes Relative: 8 %
Neutro Abs: 5.5 10*3/uL (ref 1.7–7.7)
Neutrophils Relative %: 69 %
Platelets: 244 10*3/uL (ref 150–400)
RBC: 3.86 MIL/uL — ABNORMAL LOW (ref 3.87–5.11)
RDW: 11.6 % (ref 11.5–15.5)
WBC: 8.1 10*3/uL (ref 4.0–10.5)
nRBC: 0 % (ref 0.0–0.2)

## 2023-05-28 LAB — COMPREHENSIVE METABOLIC PANEL
ALT: 10 U/L (ref 0–44)
AST: 14 U/L — ABNORMAL LOW (ref 15–41)
Albumin: 3.3 g/dL — ABNORMAL LOW (ref 3.5–5.0)
Alkaline Phosphatase: 65 U/L (ref 38–126)
Anion gap: 9 (ref 5–15)
BUN: 7 mg/dL — ABNORMAL LOW (ref 8–23)
CO2: 26 mmol/L (ref 22–32)
Calcium: 8.3 mg/dL — ABNORMAL LOW (ref 8.9–10.3)
Chloride: 94 mmol/L — ABNORMAL LOW (ref 98–111)
Creatinine, Ser: 0.74 mg/dL (ref 0.44–1.00)
GFR, Estimated: >60 mL/min (ref >60)
Glucose, Bld: 93 mg/dL (ref 70–99)
Potassium: 3.6 mmol/L (ref 3.5–5.1)
Sodium: 129 mmol/L — ABNORMAL LOW (ref 135–145)
Total Bilirubin: 0.7 mg/dL (ref 0.3–1.2)
Total Protein: 6.8 g/dL (ref 6.5–8.1)

## 2023-05-28 NOTE — Progress Notes (Signed)
Date:  05/28/2023   Name:  Tracy Chase   DOB:  1955/12/18   MRN:  [id number]   Chief Complaint: Flank Pain (Saturday patient thought she pinched a nerve in her back. Laid in the bed and tried to get pain to go away. She felt better Sunday and Monday and now pain is stabbing pain. Fells like someone is poking her through her chest to her back. )  Abdominal Pain This is a new problem. Episode onset: 2 days ago. The onset quality is sudden. The problem has been unchanged. The pain is located in the RUQ. The pain is moderate. The quality of the pain is colicky, cramping and sharp. The abdominal pain radiates to the back. Associated symptoms include nausea. Pertinent negatives include no belching, constipation, diarrhea or fever. Nothing aggravates the pain.    Lab Results  Component Value Date   NA 139 01/08/2023   K 3.8 01/08/2023   CO2 27 01/08/2023   GLUCOSE 94 01/08/2023   BUN 10 01/08/2023   CREATININE 0.68 01/08/2023   CALCIUM 8.8 (L) 01/08/2023   EGFR 79 11/06/2022   GFRNONAA >60 01/08/2023   Lab Results  Component Value Date   CHOL 219 (H) 11/06/2022   HDL 95 11/06/2022   LDLCALC 112 (H) 11/06/2022   TRIG 66 11/06/2022   CHOLHDL 2.3 11/06/2022   Lab Results  Component Value Date   TSH 1.620 11/06/2022   Lab Results  Component Value Date   HGBA1C 5.3 11/01/2021   Lab Results  Component Value Date   WBC 4.7 11/06/2022   HGB 13.4 11/06/2022   HCT 40.7 11/06/2022   MCV 92 11/06/2022   PLT 208 11/06/2022   Lab Results  Component Value Date   ALT 16 11/06/2022   AST 19 11/06/2022   ALKPHOS 62 11/06/2022   BILITOT 0.4 11/06/2022   No results found for: "25OHVITD2", "25OHVITD3", "VD25OH"   Review of Systems  Constitutional:  Negative for chills, fatigue and fever.  Respiratory:  Negative for chest tightness and shortness of breath.   Cardiovascular:  Negative for chest pain.  Gastrointestinal:  Positive for abdominal pain and nausea. Negative for  constipation and diarrhea.    Patient Active Problem List   Diagnosis Date Noted   Recurrent incisional hernia 01/10/2023   Rib lesion 08/09/2022   Trigger index finger of right hand 11/01/2021   Atherosclerosis of aorta (HCC) 10/17/2021   S/P TKR (total knee replacement) using cement, left 02/21/2021   Mixed hyperlipidemia 09/25/2019   Encounter for screening colonoscopy    Polyp of colon    Degenerative disc disease, cervical 11/18/2017   Generalized anxiety disorder 06/21/2015   History of renal cell cancer 06/21/2015   Carpal tunnel syndrome 06/21/2015   Essential (primary) hypertension 06/21/2015   Idiopathic insomnia 06/21/2015    Allergies  Allergen Reactions   Hydrocodone Itching    Past Surgical History:  Procedure Laterality Date   ABDOMINAL HYSTERECTOMY     APPENDECTOMY  2011   Lap converted to exlap appendectomy, drainage of abd abscesses   CESAREAN SECTION     COLONOSCOPY WITH PROPOFOL N/A 08/28/2019   Procedure: COLONOSCOPY WITH PROPOFOL;  Surgeon: Pasty Spillers, MD;  Location: [facility];  Service: Endoscopy;  Laterality: N/A;   cryoablation for renal cell carcinoma  2012   ETHMOIDECTOMY Left 09/06/2020   Procedure: TOTAL ETHMOIDECTOMY;  Surgeon: Geanie Logan, MD;  Location: [facility];  Service: ENT;  Laterality: Left;   FRONTAL SINUS  EXPLORATION Left 09/06/2020   Procedure: FRONTAL SINUS EXPLORATION;  Surgeon: Geanie Logan, MD;  Location: [facility];  Service: ENT;  Laterality: Left;   IMAGE GUIDED SINUS SURGERY N/A 09/06/2020   Procedure: IMAGE GUIDED SINUS SURGERY;  Surgeon: Geanie Logan, MD;  Location: [facility];  Service: ENT;  Laterality: N/A;  need stryker disk PLACE DISK ON OR CHARGE NURSE DESK 10-01 KP   INSERTION OF MESH  01/10/2023   Procedure: INSERTION OF MESH;  Surgeon: Henrene Dodge, MD;  Location: [facility];  Service: General;;   JOINT REPLACEMENT Left    partial knee replacement   LUMBAR FUSION   1994   MAXILLARY ANTROSTOMY Left 09/06/2020   Procedure: MAXILLARY ANTROSTOMY WITH TISSUE;  Surgeon: Geanie Logan, MD;  Location: [facility];  Service: ENT;  Laterality: Left;   MEDIAL PARTIAL KNEE REPLACEMENT Left    SPHENOIDECTOMY Left 09/06/2020   Procedure: SPHENOIDECTOMY;  Surgeon: Geanie Logan, MD;  Location: [facility];  Service: ENT;  Laterality: Left;   TONSILLECTOMY     TOTAL KNEE REVISION Left 02/21/2021   Procedure: Revision, left partial to total knee;  Surgeon: Kennedy Bucker, MD;  Location: [facility];  Service: Orthopedics;  Laterality: Left;  CHRIS GAINES TO ASSIST   VENTRAL HERNIA REPAIR  2012   open ventral hernia repair with mesh   XI ROBOTIC ASSISTED VENTRAL HERNIA N/A 01/10/2023   Procedure: XI ROBOTIC ASSISTED VENTRAL HERNIA WITH LYSIS OF ADHESIONS;  Surgeon: Henrene Dodge, MD;  Location: [facility];  Service: General;  Laterality: N/A;    Social History   Tobacco Use   Smoking status: Former    Current packs/day: 0.00    Types: Cigarettes    Quit date: 1990    Years since quitting: 34.5    Passive exposure: Past   Smokeless tobacco: Never   Tobacco comments:    was "social Smoker" over 15 yrs ago  Vaping Use   Vaping status: Never Used  Substance Use Topics   Alcohol use: Yes    Comment: 2-3 times weekly   Drug use: No     Medication list has been reviewed and updated.  Current Meds  Medication Sig   ALPRAZolam (XANAX) 0.25 MG tablet Take 1 tablet (0.25 mg total) by mouth 2 (two) times daily as needed for anxiety.   docusate sodium (COLACE) 100 MG capsule Take 1 capsule (100 mg total) by mouth 2 (two) times daily. (Patient taking differently: Take 100 mg by mouth daily as needed.)   FLUoxetine (PROZAC) 20 MG capsule TAKE 1 CAPSULE BY MOUTH DAILY (Patient taking differently: Take 20 mg by mouth every morning.)   gabapentin (NEURONTIN) 300 MG capsule Take 1 capsule by mouth at bedtime.   triamterene-hydrochlorothiazide (MAXZIDE-25)  37.5-25 MG tablet Take 1 tablet by mouth daily.   zolpidem (AMBIEN) 10 MG tablet TAKE 1 TABLET BY MOUTH AT  BEDTIME       05/20/2023    1:24 PM 12/25/2022    8:40 AM 11/06/2022   10:05 AM 07/27/2022    3:37 PM  GAD 7 : Generalized Anxiety Score  Nervous, Anxious, on Edge 3 2 1 2   Control/stop worrying 3 2 3 2   Worry too much - different things 3 2 2 2   Trouble relaxing 0 2 1 2   Restless 0 2 1 2   Easily annoyed or irritable 2 2 0 0  Afraid - awful might happen 0 2 2 0  Total GAD 7 Score 11 14 10  10  Anxiety Difficulty Not difficult at all Not difficult at all Not difficult at all Not difficult at all       05/20/2023    1:24 PM 12/25/2022    8:40 AM 11/06/2022   10:05 AM  Depression screen PHQ 2/9  Decreased Interest 0 0 0  Down, Depressed, Hopeless 0 0 1  PHQ - 2 Score 0 0 1  Altered sleeping 0 1 0  Tired, decreased energy 0 0 0  Change in appetite 0 0 0  Feeling bad or failure about yourself  0 0 0  Trouble concentrating 0 0 0  Moving slowly or fidgety/restless 0 0 0  Suicidal thoughts 0 0 0  PHQ-9 Score 0 1 1  Difficult doing work/chores Not difficult at all Not difficult at all Not difficult at all    BP Readings from Last 3 Encounters:  05/28/23 124/70  05/20/23 124/60  03/26/23 118/79    Physical Exam Constitutional:      Appearance: Normal appearance.  Cardiovascular:     Rate and Rhythm: Normal rate and regular rhythm.  Pulmonary:     Effort: Pulmonary effort is normal.     Breath sounds: No wheezing or rhonchi.  Abdominal:     General: Abdomen is flat.     Palpations: Abdomen is soft. There is no mass.     Tenderness: There is abdominal tenderness in the right upper quadrant. There is no right CVA tenderness or left CVA tenderness.  Musculoskeletal:     Cervical back: Normal range of motion.  Neurological:     Mental Status: She is alert.     Wt Readings from Last 3 Encounters:  05/28/23 182 lb (82.6 kg)  05/20/23 185 lb 6.4 oz (84.1 kg)   01/25/23 187 lb (84.8 kg)    BP 124/70   Pulse 85   Ht 5\' 6"  (1.676 m)   Wt 182 lb (82.6 kg)   SpO2 98%   BMI 29.38 kg/m   Assessment and Plan:  Problem List Items Addressed This Visit   None Visit Diagnoses     Colicky RUQ abdominal pain    -  Primary   Suspect gall bladder disease will get labs and [place]   Relevant Orders   US Abdomen Limited RUQ (LIVER/GB)   CBC with Differential/Platelet   Comprehensive metabolic panel       No follow-ups on file.    Reubin Milan, MD [facility] [place]

## 2023-05-28 NOTE — Telephone Encounter (Signed)
Chief Complaint: Flank pain Symptoms: Right side pain Frequency: onset Saturday and constant Pertinent Negatives: Patient denies SOB, chest pain, urinary symptoms Disposition: [] ED /[] Urgent Care (no appt availability in office) / [x] Appointment(In office/virtual)/ []  [place] [facility]/ [] Home Care/ [] Refused Recommended Disposition /[] [place] [facility]/ []  Follow-up with PCP Additional Notes: Patient stated she began to have right side pain near her back on Saturday. She reported she thought it was a pinched nerve at the time so she tried to sleep it off and that usually helps. Patient stated that the pain is waking her up throughout the night. Rates pain 7/10. Patient stated it feels like a stabbing pain. Patient has tried over the counter pain medication that has not improved the pain. Advised patient that she needs to be evaluated. Patient agreeable and scheduled today at 1620 with PCP.   Reason for Disposition  MODERATE pain (e.g., interferes with normal activities or awakens from sleep)  Answer Assessment - Initial Assessment Questions 1. LOCATION: "Where does it hurt?" (e.g., left, right)     Right side towards the back 2. ONSET: "When did the pain start?"     Onset was Saturday but it is getting worse  3. SEVERITY: "How bad is the pain?" (e.g., Scale 1-10; mild, moderate, or severe)   - MILD (1-3): doesn't interfere with normal activities    - MODERATE (4-7): interferes with normal activities or awakens from sleep    - SEVERE (8-10): excruciating pain and patient unable to do normal activities (stays in bed)       7/10 4. PATTERN: "Does the pain come and go, or is it constant?"      Constant  5. CAUSE: "What do you think is causing the pain?"     I thought it was a pinched nerve but now I'm not so sure 6. OTHER SYMPTOMS:  "Do you have any other symptoms?" (e.g., fever, abdomen pain, vomiting, leg weakness, burning with urination, blood in urine)     No  Protocols used:  Flank Pain-A-AH

## 2023-05-29 ENCOUNTER — Telehealth: Payer: Self-pay | Admitting: *Deleted

## 2023-05-29 ENCOUNTER — Other Ambulatory Visit: Payer: Self-pay | Admitting: Internal Medicine

## 2023-05-29 ENCOUNTER — Ambulatory Visit
Admission: RE | Admit: 2023-05-29 | Discharge: 2023-05-29 | Disposition: A | Discharge status: Home / Self Care | Payer: Medicare Other | Source: Ambulatory Visit | Attending: Internal Medicine | Admitting: Internal Medicine

## 2023-05-29 ENCOUNTER — Ambulatory Visit: Payer: Medicare Other | Admitting: Surgery

## 2023-05-29 ENCOUNTER — Other Ambulatory Visit
Admission: RE | Admit: 2023-05-29 | Discharge: 2023-05-29 | Disposition: A | Discharge status: Home / Self Care | Payer: Medicare Other | Source: Home / Self Care | Attending: Surgery | Admitting: Surgery

## 2023-05-29 ENCOUNTER — Encounter: Payer: Self-pay | Admitting: Surgery

## 2023-05-29 VITALS — BP 106/71 | HR 92 | Temp 98.0°F | Ht 66.0 in | Wt 179.0 lb

## 2023-05-29 DIAGNOSIS — R1084 Generalized abdominal pain: Secondary | ICD-10-CM | POA: Insufficient documentation

## 2023-05-29 DIAGNOSIS — R1011 Right upper quadrant pain: Secondary | ICD-10-CM | POA: Insufficient documentation

## 2023-05-29 DIAGNOSIS — M549 Dorsalgia, unspecified: Secondary | ICD-10-CM

## 2023-05-29 DIAGNOSIS — R109 Unspecified abdominal pain: Secondary | ICD-10-CM | POA: Insufficient documentation

## 2023-05-29 DIAGNOSIS — K769 Liver disease, unspecified: Secondary | ICD-10-CM | POA: Diagnosis not present

## 2023-05-29 LAB — URINALYSIS, ROUTINE W REFLEX MICROSCOPIC
Bilirubin Urine: NEGATIVE
Glucose, UA: NEGATIVE mg/dL
Hgb urine dipstick: NEGATIVE
Ketones, ur: 80 mg/dL — AB
Leukocytes,Ua: NEGATIVE
Nitrite: NEGATIVE
Protein, ur: NEGATIVE mg/dL
Specific Gravity, Urine: 1.009 (ref 1.005–1.030)
pH: 6 (ref 5.0–8.0)

## 2023-05-29 MED ORDER — TRAMADOL HCL 50 MG PO TABS: 50.0000 mg | ORAL_TABLET | Freq: Four times a day (QID) | ORAL | 0 refills | Status: AC | PRN

## 2023-05-29 NOTE — Progress Notes (Signed)
05/29/2023  History of Present Illness: Tracy Chase is a 67 y.o. female status post robotic assisted ventral hernia repair on 01/10/2023 for recurrent ventral hernia.  Patient saw her PCP yesterday due to persistent right-sided abdominal, flank, and back pain.  The patient reports that her pain started about 4-5 days ago on 05/25/2023.  The patient has a history of back surgery but she has not had any issues with back pain until this episode started.  She denies having done anything strenuous or any trauma to her back.  The pain is located to the right side of her back as well as her right flank and right upper quadrant.  The patient has not eating much because she is afraid that this can worsen her pain but denies any worsening postprandial pain overall.  She has been trying to drink water.  The pain is worse at nighttime.  She had labs done yesterday which shows some hyponatremia and hypochloremia but normal LFTs aside from a mildly low AST of 14.  Her white blood cell count was normal at 8.1.  She had an ultrasound of the right upper quadrant done today which have personally reviewed.  This shows cholelithiasis with small mobile stones and some polyps along the gallbladder wall.  However the gallbladder wall is not thickened measuring only 2 mm without any pericholecystic fluid.  The patient has tried taking ibuprofen and Tylenol which have not helped for the pain.  Past Medical History: Past Medical History:  Diagnosis Date   Anemia    h/o   Aortic atherosclerosis (HCC)    Arthritis    Cancer (HCC)    RENAL CELL   Depression    GERD (gastroesophageal reflux disease)    OCC NO MEDS   Hypertension    Incisional hernia    Insomnia    Rib lesion    3 x 2.1 x 2 cm expansile right fourth rib posteriorly   Sepsis [facility])      Past Surgical History: Past Surgical History:  Procedure Laterality Date   ABDOMINAL HYSTERECTOMY     APPENDECTOMY  2011   Lap converted to exlap appendectomy,  drainage of abd abscesses   CESAREAN SECTION     COLONOSCOPY WITH PROPOFOL N/A 08/28/2019   Procedure: COLONOSCOPY WITH PROPOFOL;  Surgeon: Pasty Spillers, MD;  Location: [facility];  Service: Endoscopy;  Laterality: N/A;   cryoablation for renal cell carcinoma  2012   ETHMOIDECTOMY Left 09/06/2020   Procedure: TOTAL ETHMOIDECTOMY;  Surgeon: Geanie Logan, MD;  Location: [facility];  Service: ENT;  Laterality: Left;   FRONTAL SINUS EXPLORATION Left 09/06/2020   Procedure: FRONTAL SINUS EXPLORATION;  Surgeon: Geanie Logan, MD;  Location: [facility];  Service: ENT;  Laterality: Left;   IMAGE GUIDED SINUS SURGERY N/A 09/06/2020   Procedure: IMAGE GUIDED SINUS SURGERY;  Surgeon: Geanie Logan, MD;  Location: [facility];  Service: ENT;  Laterality: N/A;  need stryker disk PLACE DISK ON OR CHARGE NURSE DESK 10-01 KP   INSERTION OF MESH  01/10/2023   Procedure: INSERTION OF MESH;  Surgeon: Henrene Dodge, MD;  Location: [facility] OR[facility];  Service: General;;   JOINT REPLACEMENT Left    partial knee replacement   LUMBAR FUSION  1994   MAXILLARY ANTROSTOMY Left 09/06/2020   Procedure: MAXILLARY ANTROSTOMY WITH TISSUE;  Surgeon: Geanie Logan, MD;  Location: [facility];  Service: ENT;  Laterality: Left;   MEDIAL PARTIAL KNEE REPLACEMENT Left    SPHENOIDECTOMY Left  09/06/2020   Procedure: SPHENOIDECTOMY;  Surgeon: Geanie Logan, MD;  Location: [facility];  Service: ENT;  Laterality: Left;   TONSILLECTOMY     TOTAL KNEE REVISION Left 02/21/2021   Procedure: Revision, left partial to total knee;  Surgeon: Kennedy Bucker, MD;  Location: [facility];  Service: Orthopedics;  Laterality: Left;  CHRIS GAINES TO ASSIST   VENTRAL HERNIA REPAIR  2012   open ventral hernia repair with mesh   XI ROBOTIC ASSISTED VENTRAL HERNIA N/A 01/10/2023   Procedure: XI ROBOTIC ASSISTED VENTRAL HERNIA WITH LYSIS OF ADHESIONS;  Surgeon: Henrene Dodge, MD;  Location: [facility] OR[facility];  Service:  General;  Laterality: N/A;    Home Medications: Prior to Admission medications   Medication Sig Start Date End Date Taking? Authorizing Provider  ALPRAZolam (XANAX) 0.25 MG tablet Take 1 tablet (0.25 mg total) by mouth 2 (two) times daily as needed for anxiety. 05/20/23  Yes Reubin Milan, MD  docusate sodium (COLACE) 100 MG capsule Take 1 capsule (100 mg total) by mouth 2 (two) times daily. Patient taking differently: Take 100 mg by mouth daily as needed. 02/23/21  Yes Evon Slack, PA-C  FLUoxetine (PROZAC) 20 MG capsule TAKE 1 CAPSULE BY MOUTH DAILY Patient taking differently: Take 20 mg by mouth every morning. 11/09/22  Yes Reubin Milan, MD  gabapentin (NEURONTIN) 300 MG capsule Take 1 capsule by mouth at bedtime. 04/23/22  Yes [provider]  traMADol (ULTRAM) 50 MG tablet Take 1 tablet (50 mg total) by mouth every 6 (six) hours as needed for moderate pain. 05/29/23 05/28/24 Yes Chrisangel Eskenazi, Elita Quick, MD  triamterene-hydrochlorothiazide (MAXZIDE-25) 37.5-25 MG tablet Take 1 tablet by mouth daily. 05/20/23  Yes Reubin Milan, MD  zolpidem (AMBIEN) 10 MG tablet TAKE 1 TABLET BY MOUTH AT  BEDTIME 04/15/23  Yes Reubin Milan, MD    Allergies: Allergies  Allergen Reactions   Hydrocodone Itching    Review of Systems: Review of Systems  Constitutional:  Negative for chills and fever.  Respiratory:  Negative for shortness of breath.   Cardiovascular:  Negative for chest pain.  Gastrointestinal:  Positive for abdominal pain. Negative for nausea and vomiting.  Musculoskeletal:  Positive for back pain.  Skin:  Negative for rash.    Physical Exam BP 106/71   Pulse 92   Temp 98 F (36.7 C)   Ht 5\' 6"  (1.676 m)   Wt 179 lb (81.2 kg)   SpO2 100%   BMI 28.89 kg/m  CONSTITUTIONAL: No acute distress, well-nourished HEENT:  Normocephalic, atraumatic, extraocular motion intact. RESPIRATORY:  Normal respiratory effort without pathologic use of accessory  muscles. CARDIOVASCULAR: Regular rhythm and rate. GI: The abdomen is soft, nondistended, with some diffuse tenderness over the right side of the abdomen starting in the mid abdomen where would be the periumbilical area (please note she does not have an umbilicus due to prior exploratory laparotomy surgery), as well as right lower quadrant, right upper quadrant, right flank, and right back with some mild CVA tenderness.  NEUROLOGIC:  Motor and sensation is grossly normal.  Cranial nerves are grossly intact. PSYCH:  Alert and oriented to person, place and time. Affect is normal.  Labs/Imaging: Labs from 05/28/2023: Sodium 129, potassium 3.6, chloride 94, CO2 26, BUN 7, creatinine 0.74.  Total bilirubin 0.7, AST 14, ALT 10, alkaline phosphatase 65, albumin 3.3.  WBC 8.1, hemoglobin 12.1, hematocrit 35.1, platelets 244.  Ultrasound RUQ on 05/29/2023: IMPRESSION: 1. Small mobile gallstones. No sonographic evidence of  acute cholecystitis. 2. Adherent gallstones or polyps measuring up to 0.7 cm. Although almost certainly benign, given size recommend follow-up ultrasound in 12 months to ensure stability. 3. Echogenic lesion of the posterior right lobe of the liver measuring 2.0 cm, consistent with a benign hemangioma seen by prior CT examination. No further follow-up or characterization is required.  Assessment and Plan: This is a 67 y.o. female with right-sided abdominal and back pain.  - Discussed with the patient that although this could potentially be related to biliary colic or cholelithiasis, the pain that she is having appears to be much more diffuse in the right abdomen including right upper quadrant, right lower quadrant, right flank, and back but her labs and ultrasound did not show or support any evidence of worsening infection or inflammatory changes.  Discussed with patient that although this could be her gallbladder indeed, there are other potential reasons to have such widespread  abdominal pain and back pain.  Potentially she could have kidney stones or ascending urinary tract infection although she denies any dysuria.  As a precaution, will order urinalysis with reflex culture as well as a CT scan of her abdomen to further evaluate. - If this otherwise is indeed her gallbladder causing pain, then we will schedule her for surgery but otherwise we will do appropriate referrals as needed. - I will call the patient with the results of her CT and labs.  I spent 30 minutes dedicated to the care of this patient on the date of this encounter to include pre-visit review of records, face-to-face time with the patient discussing diagnosis and management, and any post-visit coordination of care.   Howie Ill, MD [facility]

## 2023-05-29 NOTE — Telephone Encounter (Signed)
  Chief Complaint: Results Symptoms: NA Frequency: NA Pertinent Negatives: Patient denies NA Disposition: [] ED /[] Urgent Care (no appt availability in office) / [] Appointment(In office/virtual)/ []  [place] [facility]/ [] Home Care/ [] Refused Recommended Disposition /[] [place] [facility]/ * Follow-up with PCP  Additional Notes:  'Jimmey Ralph' with [facility] calling report on pt's [place]. Results are available in EPIC. Jimmey Ralph states pt had reported she may have had a fever last night and today.

## 2023-05-29 NOTE — Patient Instructions (Addendum)
We have ordered lab work. You may have this done at [facility] anytime. You should enter in through the [facility] entrance. Just let them know you have lab work.   We will schedule you for a CT scan to better assess what may be happening.   You are scheduled for a CT scan tomorrow(05/30/23) at [facility]. You will need to arrive there at 2:30 pm and will need to drink 2 cups of water prior to your arrival.   We will send you in a prescription for pain medication.   We will call you with your results.    Gallbladder Eating Plan High blood cholesterol, obesity, a sedentary lifestyle, an unhealthy diet, and diabetes are risk factors for developing gallstones. If you have a gallbladder condition, you may have trouble digesting fats and tolerating high fat intake. Eating a low-fat diet can help reduce your symptoms and may be helpful before and after having surgery to remove your gallbladder (cholecystectomy). Your health care provider may recommend that you work with a dietitian to help you reduce the amount of fat in your diet. What are tips for following this plan? General guidelines Limit your fat intake to less than 30% of your total daily calories. If you eat around 1,800 calories each day, this means eating less than 60 grams (g) of fat per day. Fat is an important part of a healthy diet. Eating a low-fat diet can make it hard to maintain a healthy body weight. Ask your dietitian how much fat, calories, and other nutrients you need each day. Eat small, frequent meals throughout the day instead of three large meals. Drink at least 8-10 cups (1.9-2.4 L) of fluid a day. Drink enough fluid to keep your urine pale yellow. If you drink alcohol: Limit how much you have to: 0-1 drink a day for women who are not pregnant. 0-2 drinks a day for men. Know how much alcohol is in a drink. In the [place] one drink equals one 12 oz bottle of beer (355 mL), one 5 oz glass of wine (148 mL), or  one 1 oz glass of hard liquor (44 mL). Reading food labels  Check nutrition facts on food labels for the amount of fat per serving. Choose foods with less than 3 grams of fat per serving. Shopping Choose nonfat and low-fat healthy foods. Look for the words "nonfat," "low-fat," or "fat-free." Avoid buying processed or prepackaged foods. Cooking Cook using low-fat methods, such as baking, broiling, grilling, or boiling. Cook with small amounts of healthy fats, such as olive oil, grapeseed oil, canola oil, avocado oil, or sunflower oil. What foods are recommended?  All fresh, frozen, or canned fruits and vegetables. Whole grains. Low-fat or nonfat (skim) milk and yogurt. Lean meat, skinless poultry, fish, eggs, and beans. Low-fat protein supplement powders or drinks. Spices and herbs. The items listed above may not be a complete list of foods and beverages you can eat and drink. Contact a dietitian for more information. What foods are not recommended? High-fat foods. These include baked goods, fast food, fatty cuts of meat, ice cream, french toast, sweet rolls, pizza, cheese bread, foods covered with butter, creamy sauces, or cheese. Fried foods. These include french fries, tempura, battered fish, breaded chicken, fried breads, and sweets. Foods that cause bloating and gas. The items listed above may not be a complete list of foods that you should avoid. Contact a dietitian for more information. Summary A low-fat diet can be helpful if you  have a gallbladder condition, or before and after gallbladder surgery. Limit your fat intake to less than 30% of your total daily calories. This is about 60 g of fat if you eat 1,800 calories each day. Eat small, frequent meals throughout the day instead of three large meals. This information is not intended to replace advice given to you by your health care provider. Make sure you discuss any questions you have with your health care provider. Document  Revised: 10/13/2021 Document Reviewed: 10/13/2021 Elsevier Patient Education  2024 [employer].

## 2023-05-30 ENCOUNTER — Ambulatory Visit
Admission: RE | Admit: 2023-05-30 | Discharge: 2023-05-30 | Disposition: A | Discharge status: Home / Self Care | Payer: Medicare Other | Source: Ambulatory Visit | Attending: Surgery | Admitting: Surgery

## 2023-05-30 DIAGNOSIS — R109 Unspecified abdominal pain: Secondary | ICD-10-CM

## 2023-05-30 DIAGNOSIS — N281 Cyst of kidney, acquired: Secondary | ICD-10-CM | POA: Diagnosis not present

## 2023-05-30 DIAGNOSIS — K5750 Diverticulosis of both small and large intestine without perforation or abscess without bleeding: Secondary | ICD-10-CM | POA: Diagnosis not present

## 2023-05-30 MED ORDER — IOHEXOL 300 MG/ML  SOLN
85.0000 mL | Freq: Once | INTRAMUSCULAR | Status: AC | PRN
  Administered 2023-05-30: 85 mL via INTRAVENOUS

## 2023-05-31 ENCOUNTER — Encounter: Payer: Self-pay | Admitting: Surgery

## 2023-05-31 ENCOUNTER — Inpatient Hospital Stay
Admission: EM | Admit: 2023-05-31 | Discharge: 2023-06-06 | DRG: 391 | Disposition: A | Discharge status: Home / Self Care | Payer: Medicare Other | Attending: Surgery | Admitting: Surgery

## 2023-05-31 ENCOUNTER — Other Ambulatory Visit: Payer: Self-pay

## 2023-05-31 DIAGNOSIS — D72819 Decreased white blood cell count, unspecified: Secondary | ICD-10-CM | POA: Diagnosis not present

## 2023-05-31 DIAGNOSIS — Z885 Allergy status to narcotic agent status: Secondary | ICD-10-CM

## 2023-05-31 DIAGNOSIS — K265 Chronic or unspecified duodenal ulcer with perforation: Secondary | ICD-10-CM | POA: Diagnosis present

## 2023-05-31 DIAGNOSIS — K5700 Diverticulitis of small intestine with perforation and abscess without bleeding: Secondary | ICD-10-CM

## 2023-05-31 DIAGNOSIS — K219 Gastro-esophageal reflux disease without esophagitis: Secondary | ICD-10-CM | POA: Diagnosis present

## 2023-05-31 DIAGNOSIS — Z96652 Presence of left artificial knee joint: Secondary | ICD-10-CM | POA: Diagnosis present

## 2023-05-31 DIAGNOSIS — Z8249 Family history of ischemic heart disease and other diseases of the circulatory system: Secondary | ICD-10-CM

## 2023-05-31 DIAGNOSIS — Z841 Family history of disorders of kidney and ureter: Secondary | ICD-10-CM | POA: Diagnosis not present

## 2023-05-31 DIAGNOSIS — Z90710 Acquired absence of both cervix and uterus: Secondary | ICD-10-CM | POA: Diagnosis not present

## 2023-05-31 DIAGNOSIS — Z79899 Other long term (current) drug therapy: Secondary | ICD-10-CM | POA: Diagnosis not present

## 2023-05-31 DIAGNOSIS — K824 Cholesterolosis of gallbladder: Secondary | ICD-10-CM | POA: Diagnosis present

## 2023-05-31 DIAGNOSIS — Z85528 Personal history of other malignant neoplasm of kidney: Secondary | ICD-10-CM | POA: Diagnosis not present

## 2023-05-31 DIAGNOSIS — Z981 Arthrodesis status: Secondary | ICD-10-CM | POA: Diagnosis not present

## 2023-05-31 DIAGNOSIS — I10 Essential (primary) hypertension: Secondary | ICD-10-CM | POA: Diagnosis present

## 2023-05-31 DIAGNOSIS — Z87891 Personal history of nicotine dependence: Secondary | ICD-10-CM | POA: Diagnosis not present

## 2023-05-31 HISTORY — DX: Diverticulitis of small intestine with perforation and abscess without bleeding: K57.00

## 2023-05-31 LAB — CBC WITH DIFFERENTIAL/PLATELET
Abs Immature Granulocytes: 0.03 10*3/uL (ref 0.00–0.07)
Basophils Absolute: 0 10*3/uL (ref 0.0–0.1)
Basophils Relative: 0 %
Eosinophils Absolute: 0 10*3/uL (ref 0.0–0.5)
Eosinophils Relative: 1 %
HCT: 38.4 % (ref 36.0–46.0)
Hemoglobin: 12.9 g/dL (ref 12.0–15.0)
Immature Granulocytes: 0 %
Lymphocytes Relative: 11 %
Lymphs Abs: 1 10*3/uL (ref 0.7–4.0)
MCH: 30.5 pg (ref 26.0–34.0)
MCHC: 33.6 g/dL (ref 30.0–36.0)
MCV: 90.8 fL (ref 80.0–100.0)
Monocytes Absolute: 0.6 10*3/uL (ref 0.1–1.0)
Monocytes Relative: 7 %
Neutro Abs: 7.1 10*3/uL (ref 1.7–7.7)
Neutrophils Relative %: 81 %
Platelets: 301 10*3/uL (ref 150–400)
RBC: 4.23 MIL/uL (ref 3.87–5.11)
RDW: 11.1 % — ABNORMAL LOW (ref 11.5–15.5)
WBC: 8.8 10*3/uL (ref 4.0–10.5)
nRBC: 0 % (ref 0.0–0.2)

## 2023-05-31 LAB — COMPREHENSIVE METABOLIC PANEL
ALT: 10 U/L (ref 0–44)
AST: 11 U/L — ABNORMAL LOW (ref 15–41)
Albumin: 3.1 g/dL — ABNORMAL LOW (ref 3.5–5.0)
Alkaline Phosphatase: 70 U/L (ref 38–126)
Anion gap: 12 (ref 5–15)
BUN: <5 mg/dL — ABNORMAL LOW (ref 8–23)
CO2: 27 mmol/L (ref 22–32)
Calcium: 8.5 mg/dL — ABNORMAL LOW (ref 8.9–10.3)
Chloride: 95 mmol/L — ABNORMAL LOW (ref 98–111)
Creatinine, Ser: 0.65 mg/dL (ref 0.44–1.00)
GFR, Estimated: >60 mL/min (ref >60)
Glucose, Bld: 116 mg/dL — ABNORMAL HIGH (ref 70–99)
Potassium: 3.3 mmol/L — ABNORMAL LOW (ref 3.5–5.1)
Sodium: 134 mmol/L — ABNORMAL LOW (ref 135–145)
Total Bilirubin: 0.3 mg/dL (ref 0.3–1.2)
Total Protein: 6.7 g/dL (ref 6.5–8.1)

## 2023-05-31 LAB — URINE CULTURE: Culture: NO GROWTH

## 2023-05-31 LAB — MAGNESIUM: Magnesium: 1.9 mg/dL (ref 1.7–2.4)

## 2023-05-31 LAB — HIV ANTIBODY (ROUTINE TESTING W REFLEX): HIV Screen 4th Generation wRfx: NONREACTIVE

## 2023-05-31 MED ORDER — ONDANSETRON 4 MG PO TBDP: 4.0000 mg | ORAL_TABLET | Freq: Four times a day (QID) | ORAL | Status: DC | PRN

## 2023-05-31 MED ORDER — KCL IN DEXTROSE-NACL 20-5-0.45 MEQ/L-%-% IV SOLN
INTRAVENOUS | Status: DC
  Filled 2023-05-31 (×7): qty 1000

## 2023-05-31 MED ORDER — HYDROMORPHONE HCL 1 MG/ML IJ SOLN: 0.5000 mg | INTRAMUSCULAR | Status: DC | PRN

## 2023-05-31 MED ORDER — HYDRALAZINE HCL 20 MG/ML IJ SOLN: 10.0000 mg | Freq: Four times a day (QID) | INTRAMUSCULAR | Status: DC | PRN

## 2023-05-31 MED ORDER — ZOLPIDEM TARTRATE 5 MG PO TABS
5.0000 mg | ORAL_TABLET | Freq: Every evening | ORAL | Status: DC | PRN
  Administered 2023-05-31 – 2023-06-02 (×3): 5 mg via ORAL
  Filled 2023-05-31 (×3): qty 1

## 2023-05-31 MED ORDER — ONDANSETRON HCL 4 MG/2ML IJ SOLN: 4.0000 mg | Freq: Four times a day (QID) | INTRAMUSCULAR | Status: DC | PRN

## 2023-05-31 MED ORDER — ACETAMINOPHEN 10 MG/ML IV SOLN: 1000.0000 mg | Freq: Four times a day (QID) | INTRAVENOUS | Status: AC | PRN

## 2023-05-31 MED ORDER — PANTOPRAZOLE SODIUM 40 MG IV SOLR
40.0000 mg | Freq: Two times a day (BID) | INTRAVENOUS | Status: DC
  Administered 2023-05-31 – 2023-06-05 (×10): 40 mg via INTRAVENOUS
  Filled 2023-05-31 (×9): qty 10

## 2023-05-31 MED ORDER — KETOROLAC TROMETHAMINE 30 MG/ML IJ SOLN
15.0000 mg | Freq: Four times a day (QID) | INTRAMUSCULAR | Status: AC | PRN
  Administered 2023-05-31 – 2023-06-04 (×8): 15 mg via INTRAVENOUS
  Filled 2023-05-31 (×7): qty 1

## 2023-05-31 MED ORDER — PIPERACILLIN-TAZOBACTAM 3.375 G IVPB
3.3750 g | Freq: Three times a day (TID) | INTRAVENOUS | Status: DC
  Administered 2023-05-31 – 2023-06-06 (×18): 3.375 g via INTRAVENOUS
  Filled 2023-05-31 (×17): qty 50

## 2023-05-31 MED ORDER — ENOXAPARIN SODIUM 40 MG/0.4ML IJ SOSY
40.0000 mg | PREFILLED_SYRINGE | INTRAMUSCULAR | Status: DC
  Administered 2023-06-01 – 2023-06-06 (×5): 40 mg via SUBCUTANEOUS
  Filled 2023-05-31 (×6): qty 0.4

## 2023-05-31 NOTE — ED Notes (Signed)
Admit from [facility]

## 2023-05-31 NOTE — ED Triage Notes (Addendum)
Pt states her PCP sent her over here after a CT scan, pt reports scan showed her small intestine leaking bile. Pt c/o abd pain and nausea. Pt is direct admit from [facility]

## 2023-05-31 NOTE — Progress Notes (Signed)
05/31/23 CT scan images personally viewed and discussed with Dr. Alfredo Batty with radiology.  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  However, she now also has what appears to be a contained perforation going posteriorly in the 2nd portion of the duodenum, creating a 3.4 cm abscess going between duodenum and right kidney.  No free air.  Have called the patient to discuss the results and advised her to come to the ER now to be admitted for further management.  Tracy Dodge, MD

## 2023-05-31 NOTE — H&P (Signed)
Date of Admission:  05/31/2023  Reason for Admission:  Perforated duodenal diverticulitis  History of Present Illness: Tracy Chase is a 67 y.o. female with RUQ abdominal pain.  She was seen in the office on 05/29/23 with a 4-5 day history at that point of RUQ abdominal pain.  She had an ultrasound that showed cholelithiasis and gallbladder polyps, but without any inflammatory changes.  Furthermore, her pain seemed to be more widespread between RUQ, some RLQ, right flank, and back.  As such, she had a CT scan done yesterday of the abdomen and pelvis.  I have personally viewed the images and discussed with Dr. Alfredo Batty with radiology.  The patient appears to have a contained posterior duodenal perforation, possibly related to a duodenal diverticulum that has been previously seen on CT scan images.  The perforation has resulted in a 3.4 cm fluid collection, but no free air.  There is surrounding stranding in the retroperitoneum.  Due to the findings, I called the patient and advised her to come to the ER for admission.  The patient reports that she's had continued RUQ pain, which is worse at night time.  She also feels more bloated after eating, but denies any fevers or chills, and the pain has overall not worsened over the past two days.    Past Medical History: Past Medical History:  Diagnosis Date   Anemia    h/o   Aortic atherosclerosis (HCC)    Arthritis    Cancer (HCC)    RENAL CELL   Depression    GERD (gastroesophageal reflux disease)    OCC NO MEDS   Hypertension    Incisional hernia    Insomnia    Rib lesion    3 x 2.1 x 2 cm expansile right fourth rib posteriorly   Sepsis [facility])      Past Surgical History: Past Surgical History:  Procedure Laterality Date   ABDOMINAL HYSTERECTOMY     APPENDECTOMY  2011   Lap converted to exlap appendectomy, drainage of abd abscesses   CESAREAN SECTION     COLONOSCOPY WITH PROPOFOL N/A 08/28/2019   Procedure: COLONOSCOPY WITH PROPOFOL;   Surgeon: Pasty Spillers, MD;  Location: [facility] ENDOSCOPY;  Service: Endoscopy;  Laterality: N/A;   cryoablation for renal cell carcinoma  2012   ETHMOIDECTOMY Left 09/06/2020   Procedure: TOTAL ETHMOIDECTOMY;  Surgeon: Geanie Logan, MD;  Location: [facility] SUR[facility]ERY [facility]TR;  Service: ENT;  Laterality: Left;   FRONTAL SINUS EXPLORATION Left 09/06/2020   Procedure: FRONTAL SINUS EXPLORATION;  Surgeon: Geanie Logan, MD;  Location: [facility] SURGERY [facility]TR;  Service: ENT;  Laterality: Left;   IMAGE GUIDED SINUS SURGERY N/A 09/06/2020   Procedure: IMAGE GUIDED SINUS SURGERY;  Surgeon: Geanie Logan, MD;  Location: [facility];  Service: ENT;  Laterality: N/A;  need stryker disk PLACE DISK ON OR CHARGE NURSE DESK 10-01 KP   INSERTION OF MESH  01/10/2023   Procedure: INSERTION OF MESH;  Surgeon: Henrene Dodge, MD;  Location: [facility] ORS;  Service: General;;   JOINT REPLACEMENT Left    partial knee replacement   LUMBAR FUSION  1994   MAXILLARY ANTROSTOMY Left 09/06/2020   Procedure: MAXILLARY ANTROSTOMY WITH TISSUE;  Surgeon: Geanie Logan, MD;  Location: [facility] SURGERY CNTR;  Service: ENT;  Laterality: Left;   MEDIAL PARTIAL KNEE REPLACEMENT Left    SPHENOIDECTOMY Left 09/06/2020   Procedure: SPHENOIDECTOMY;  Surgeon: Geanie Logan, MD;  Location: [facility] SURGERY CNTR;  Service: ENT;  Laterality: Left;   TONSILLECTOMY  TOTAL KNEE REVISION Left 02/21/2021   Procedure: Revision, left partial to total knee;  Surgeon: Kennedy Bucker, MD;  Location: [facility] ORS;  Service: Orthopedics;  Laterality: Left;  CHRIS GAINES TO ASSIST   VENTRAL HERNIA REPAIR  2012   open ventral hernia repair with mesh   XI ROBOTIC ASSISTED VENTRAL HERNIA N/A 01/10/2023   Procedure: XI ROBOTIC ASSISTED VENTRAL HERNIA WITH LYSIS OF ADHESIONS;  Surgeon: Henrene Dodge, MD;  Location: [facility] ORS;  Service: General;  Laterality: N/A;    Home Medications: Prior to Admission medications   Medication Sig Start Date End Date Taking?  Authorizing Provider  ALPRAZolam (XANAX) 0.25 MG tablet Take 1 tablet (0.25 mg total) by mouth 2 (two) times daily as needed for anxiety. 05/20/23  Yes Reubin Milan, MD  docusate sodium (COLACE) 100 MG capsule Take 1 capsule (100 mg total) by mouth 2 (two) times daily. Patient taking differently: Take 100 mg by mouth daily as needed. 02/23/21  Yes Evon Slack, PA-C  FLUoxetine (PROZAC) 20 MG capsule TAKE 1 CAPSULE BY MOUTH DAILY Patient taking differently: Take 20 mg by mouth every morning. 11/09/22  Yes Reubin Milan, MD  gabapentin (NEURONTIN) 300 MG capsule Take 1 capsule by mouth at bedtime. 04/23/22  Yes [provider]  traMADol (ULTRAM) 50 MG tablet Take 1 tablet (50 mg total) by mouth every 6 (six) hours as needed for moderate pain. 05/29/23 05/28/24 Yes Ameirah Khatoon, Elita Quick, MD  triamterene-hydrochlorothiazide (MAXZIDE-25) 37.5-25 MG tablet Take 1 tablet by mouth daily. 05/20/23  Yes Reubin Milan, MD  zolpidem (AMBIEN) 10 MG tablet TAKE 1 TABLET BY MOUTH AT  BEDTIME 04/15/23  Yes Reubin Milan, MD    Allergies: Allergies  Allergen Reactions   Hydrocodone Itching    Social History:  reports that she quit smoking about 34 years ago. Her smoking use included cigarettes. She has been exposed to tobacco smoke. She has never used smokeless tobacco. She reports current alcohol use. She reports that she does not use drugs.   Family History: Family History  Problem Relation Age of Onset   Kidney failure Mother    Congestive Heart Failure Father    Breast cancer Neg Hx     Review of Systems: Review of Systems  Constitutional:  Negative for chills and fever.  HENT:  Negative for hearing loss.   Respiratory:  Negative for shortness of breath.   Cardiovascular:  Negative for chest pain.  Gastrointestinal:  Positive for abdominal pain and nausea. Negative for diarrhea and vomiting.  Genitourinary:  Negative for dysuria.  Musculoskeletal:  Positive for back pain.  Negative for myalgias.  Skin:  Negative for rash.  Neurological:  Negative for dizziness.  Psychiatric/Behavioral:  Negative for depression.     Physical Exam BP 107/72   Pulse 88   Temp 98.2 F (36.8 C) (Oral)   Resp 18   Ht 5\' 6"  (1.676 m)   Wt 80.7 kg   SpO2 100%   BMI 28.73 kg/m  CONSTITUTIONAL: No acute distress HEENT:  Normocephalic, atraumatic, extraocular motion intact. NECK: Trachea is midline, and there is no jugular venous distension.  RESPIRATORY:  Normal respiratory effort without pathologic use of accessory muscles. CARDIOVASCULAR: Regular rhythm and rate GI: The abdomen is soft, non-distended, with tenderness in the RUQ, without diffuse peritonitis.  MUSCULOSKELETAL:  Normal muscle strength and tone in all four extremities.  No peripheral edema or cyanosis. SKIN: Skin turgor is normal. There are no pathologic skin lesions.  NEUROLOGIC:  Motor and sensation is grossly normal.  Cranial nerves are grossly intact. PSYCH:  Alert and oriented to person, place and time. Affect is normal.  Laboratory Analysis: Results for orders placed or performed during the hospital encounter of 05/31/23 (from the past 24 hour(s))  CBC WITH DIFFERENTIAL     Status: Abnormal   Collection Time: 05/31/23  1:24 PM  Result Value Ref Range   WBC 8.8 4.0 - 10.5 K/uL   RBC 4.23 3.87 - 5.11 MIL/uL   Hemoglobin 12.9 12.0 - 15.0 g/dL   HCT 40.9 81.1 - 91.4 %   MCV 90.8 80.0 - 100.0 fL   MCH 30.5 26.0 - 34.0 pg   MCHC 33.6 30.0 - 36.0 g/dL   RDW 78.2 (L) 95.6 - 21.3 %   Platelets 301 150 - 400 K/uL   nRBC 0.0 0.0 - 0.2 %   Neutrophils Relative % 81 %   Neutro Abs 7.1 1.7 - 7.7 K/uL   Lymphocytes Relative 11 %   Lymphs Abs 1.0 0.7 - 4.0 K/uL   Monocytes Relative 7 %   Monocytes Absolute 0.6 0.1 - 1.0 K/uL   Eosinophils Relative 1 %   Eosinophils Absolute 0.0 0.0 - 0.5 K/uL   Basophils Relative 0 %   Basophils Absolute 0.0 0.0 - 0.1 K/uL   Immature Granulocytes 0 %   Abs Immature  Granulocytes 0.03 0.00 - 0.07 K/uL  Comprehensive metabolic panel     Status: Abnormal   Collection Time: 05/31/23  1:24 PM  Result Value Ref Range   Sodium 134 (L) 135 - 145 mmol/L   Potassium 3.3 (L) 3.5 - 5.1 mmol/L   Chloride 95 (L) 98 - 111 mmol/L   CO2 27 22 - 32 mmol/L   Glucose, Bld 116 (H) 70 - 99 mg/dL   BUN <5 (L) 8 - 23 mg/dL   Creatinine, Ser 0.86 0.44 - 1.00 mg/dL   Calcium 8.5 (L) 8.9 - 10.3 mg/dL   Total Protein 6.7 6.5 - 8.1 g/dL   Albumin 3.1 (L) 3.5 - 5.0 g/dL   AST 11 (L) 15 - 41 U/L   ALT 10 0 - 44 U/L   Alkaline Phosphatase 70 38 - 126 U/L   Total Bilirubin 0.3 0.3 - 1.2 mg/dL   GFR, Estimated >57 >84 mL/min   Anion gap 12 5 - 15  Magnesium     Status: None   Collection Time: 05/31/23  1:24 PM  Result Value Ref Range   Magnesium 1.9 1.7 - 2.4 mg/dL    Imaging: CT ABDOMEN PELVIS W CONTRAST  Result Date: 05/31/2023 CLINICAL DATA:  Abdominal pain. Flank pain. Patient reports right sided abdominal pain. EXAM: CT ABDOMEN AND PELVIS WITH CONTRAST TECHNIQUE: Multidetector CT imaging of the abdomen and pelvis was performed using the standard protocol following bolus administration of intravenous contrast. RADIATION DOSE REDUCTION: This exam was performed according to the departmental dose-optimization program which includes automated exposure control, adjustment of the mA and/or kV according to patient size and/or use of iterative reconstruction technique. CONTRAST:  85mL OMNIPAQUE IOHEXOL 300 MG/ML  SOLN COMPARISON:  CT of the abdomen and pelvis 11/21/2022 FINDINGS: Lower chest: Minimal dependent atelectasis is present. Lungs are otherwise clear. The heart size is normal. No significant pleural or pericardial effusion is present. Hepatobiliary: No focal liver abnormality is seen. No gallstones, gallbladder wall thickening, or biliary dilatation. Pancreas: Unremarkable. No pancreatic ductal dilatation or surrounding inflammatory changes. Spleen: Normal in size without focal  abnormality. Adrenals/Urinary Tract:  The adrenal glands are normal bilaterally. A simple exophytic cyst the left kidney extending laterally measures 21 mm. No follow-up imaging is recommended. JACR 2018 Feb; 264-273, Management of the Incidental Renal Mass on CT, [employer] 2021; 814-848, Bosniak Classification of Cystic Renal Masses, Version 2019. No other focal renal lesions are present. The ureters are within normal limits. The urinary bladder is normal. Stomach/Bowel: Stomach is within normal limits. In a medial duodenal diverticulum is again noted. In irregular peripherally enhancing posterior collection extends from the second portion of the duodenum measuring 3.4 x 2.8 x 4.0 cm. This creates mass effect on the ventral aspect of the kidney with thickening of the posterior peritoneum. No free air is present. More distal duodenum is within normal limits. The small bowel is unremarkable. Terminal ileum is normal. Appendectomy noted. The ascending and proximal transverse colon are mostly collapsed. Diverticular changes are present descending and sigmoid colon without other inflammatory change to suggest colonic diverticulitis. Vascular/Lymphatic: Atherosclerotic calcifications are present in the aorta branch vessels. No aneurysm is present. Reproductive: Uterus and bilateral adnexa are unremarkable. Other: No other free fluid or free air is present. No significant ventral hernia is present. Musculoskeletal: Harrington rods are in place T9 through L1. Slight retrolisthesis is present at L1-2. Vertebral body heights are maintained. No focal osseous lesions are present. IMPRESSION: 1. Irregular peripherally enhancing posterior collection extending from the second portion of the duodenum measuring 3.4 x 2.8 x 4.0 cm. This creates mass effect on the ventral aspect of the kidney with thickening of the posterior peritoneum. Findings are concerning for a contained perforation of a duodenal ulcer. No free air is  present. 2. Descending and sigmoid diverticulosis without diverticulitis. 3. Harrington rods in place T9 through L1. 4.  Aortic Atherosclerosis (ICD10-I70.0). Critical Value/emergent results were called by telephone at the time of interpretation on 05/31/2023 at 10:37 am to provider [facility] , who verbally acknowledged these results. Electronically Signed   By: Marin Roberts M.D.   On: 05/31/2023 10:39    Assessment and Plan: This is a 67 y.o. female with a contained perforation of the 2nd portion of the duodenum.  --Discussed with the patient again the findings on her CT scan and was able to review the images with her.  She has a contained perforation in the posterior 2nd portion of the duodenum.  This could possibly be from an ulcer vs diverticulum that she has in the medial aspect of the 2nd portion of duodenum.  There is no free air and the patient does not need emergent surgical intervention.  Unfortunately, it's also in a location that would not be amenable for drainage.   --The patient will be admitted to the surgical team.  Discussed plan for keeping her NPO over the weekend, with IV fluid hydration, appropriate pain control, IV antibiotics, IV PPI in case of an ulcer.  Would plan on obtaining an UGI study on 7/22 to evaluate for any potential leak.  Discussed the possibility of needing surgery if no improvement, possibility of needing transfer to different hospital depending on the surgery needed. --Patient is in agreement with this plan and all of her questions have been answered.  I spent 75 minutes dedicated to the care of this patient on the date of this encounter to include pre-visit review of records, face-to-face time with the patient discussing diagnosis and management, and any post-visit coordination of care.   Howie Ill, MD [facility]:  [phone]

## 2023-06-01 LAB — CBC
HCT: 31.8 % — ABNORMAL LOW (ref 36.0–46.0)
Hemoglobin: 10.8 g/dL — ABNORMAL LOW (ref 12.0–15.0)
MCH: 30.4 pg (ref 26.0–34.0)
MCHC: 34 g/dL (ref 30.0–36.0)
MCV: 89.6 fL (ref 80.0–100.0)
Platelets: 268 10*3/uL (ref 150–400)
RBC: 3.55 MIL/uL — ABNORMAL LOW (ref 3.87–5.11)
RDW: 11.2 % — ABNORMAL LOW (ref 11.5–15.5)
WBC: 6.5 10*3/uL (ref 4.0–10.5)
nRBC: 0 % (ref 0.0–0.2)

## 2023-06-01 LAB — BASIC METABOLIC PANEL
Anion gap: 6 (ref 5–15)
BUN: 6 mg/dL — ABNORMAL LOW (ref 8–23)
CO2: 28 mmol/L (ref 22–32)
Calcium: 8.1 mg/dL — ABNORMAL LOW (ref 8.9–10.3)
Chloride: 103 mmol/L (ref 98–111)
Creatinine, Ser: 0.69 mg/dL (ref 0.44–1.00)
GFR, Estimated: >60 mL/min (ref >60)
Glucose, Bld: 122 mg/dL — ABNORMAL HIGH (ref 70–99)
Potassium: 3.4 mmol/L — ABNORMAL LOW (ref 3.5–5.1)
Sodium: 137 mmol/L (ref 135–145)

## 2023-06-01 NOTE — Plan of Care (Signed)

## 2023-06-01 NOTE — Progress Notes (Signed)
Subjective:  CC: Tracy Chase is a 67 y.o. female  Hospital stay day 1,   perforated duodenal diverticulitis  HPI: No acute issues overnight.  Patient states pain is slightly better compared to admission.  ROS:  General: Denies weight loss, weight gain, fatigue, fevers, chills, and night sweats. Heart: Denies chest pain, palpitations, racing heart, irregular heartbeat, leg pain or swelling, and decreased activity tolerance. Respiratory: Denies breathing difficulty, shortness of breath, wheezing, cough, and sputum. GI: Denies change in appetite, heartburn, nausea, vomiting, constipation, diarrhea, and blood in stool. GU: Denies difficulty urinating, pain with urinating, urgency, frequency, blood in urine.   Objective:   Temp:  [97.5 F (36.4 C)-100.2 F (37.9 C)] 98.1 F (36.7 C) (07/20 0752) Pulse Rate:  [66-88] 66 (07/20 0752) Resp:  [16-18] 17 (07/20 0752) BP: (97-124)/(58-72) 97/58 (07/20 0752) SpO2:  [97 %-100 %] 97 % (07/20 0752) Weight:  [80.7 kg] 80.7 kg (07/19 1317)     Height: 5\' 6"  (167.6 cm) Weight: 80.7 kg BMI (Calculated): 28.74   Intake/Output this shift:   Intake/Output Summary (Last 24 hours) at 06/01/2023 1205 Last data filed at 06/01/2023 3244 Gross per 24 hour  Intake 1404.55 ml  Output --  Net 1404.55 ml    Constitutional :  alert, cooperative, appears stated age, and no distress  Respiratory:  clear to auscultation bilaterally  Cardiovascular:  regular rate and rhythm  Gastrointestinal: Soft, no guarding, tenderness to palpation still present right upper quadrant and midepigastric area .   Skin: Cool and moist.   Psychiatric: Normal affect, non-agitated, not confused       LABS:     Latest Ref Rng & Units 06/01/2023    5:49 AM 05/31/2023    1:24 PM 05/28/2023    5:03 PM  CMP  Glucose 70 - 99 mg/dL 010  272  93   BUN 8 - 23 mg/dL 6  <5  7   Creatinine 5.36 - 1.00 mg/dL 6.44  0.34  7.42   Sodium 135 - 145 mmol/L 137  134  129   Potassium 3.5 -  5.1 mmol/L 3.4  3.3  3.6   Chloride 98 - 111 mmol/L 103  95  94   CO2 22 - 32 mmol/L 28  27  26    Calcium 8.9 - 10.3 mg/dL 8.1  8.5  8.3   Total Protein 6.5 - 8.1 g/dL  6.7  6.8   Total Bilirubin 0.3 - 1.2 mg/dL  0.3  0.7   Alkaline Phos 38 - 126 U/L  70  65   AST 15 - 41 U/L  11  14   ALT 0 - 44 U/L  10  10       Latest Ref Rng & Units 06/01/2023    5:49 AM 05/31/2023    1:24 PM 05/28/2023    5:03 PM  CBC  WBC 4.0 - 10.5 K/uL 6.5  8.8  8.1   Hemoglobin 12.0 - 15.0 g/dL 59.5  63.8  75.6   Hematocrit 36.0 - 46.0 % 31.8  38.4  35.1   Platelets 150 - 400 K/uL 268  301  244     RADS: N/A Assessment:   Perforated duodenal diverticulitis Stable.  Continue current management with antibiotics, IV fluid, n.p.o. DVT prophylaxis with Lovenox  labs/images/medications/previous chart entries reviewed personally and relevant changes/updates noted above.

## 2023-06-01 NOTE — Progress Notes (Signed)
Patient again asking about her home medications and why she can't take them. She is especially concerned about her Prozac and possible withdrawal. Explained reasoning for strict NPO. Patient insistent contact needs to be made with surgeon again for clarification. Message sent to Dr. Tonna Boehringer, surgeon covering. Patient to maintain strict NPO.

## 2023-06-02 LAB — CBC
HCT: 32.3 % — ABNORMAL LOW (ref 36.0–46.0)
Hemoglobin: 10.8 g/dL — ABNORMAL LOW (ref 12.0–15.0)
MCH: 30.3 pg (ref 26.0–34.0)
MCHC: 33.4 g/dL (ref 30.0–36.0)
MCV: 90.5 fL (ref 80.0–100.0)
Platelets: 297 10*3/uL (ref 150–400)
RBC: 3.57 MIL/uL — ABNORMAL LOW (ref 3.87–5.11)
RDW: 11.4 % — ABNORMAL LOW (ref 11.5–15.5)
WBC: 3.5 10*3/uL — ABNORMAL LOW (ref 4.0–10.5)
nRBC: 0 % (ref 0.0–0.2)

## 2023-06-02 LAB — BASIC METABOLIC PANEL
Anion gap: 6 (ref 5–15)
BUN: <5 mg/dL — ABNORMAL LOW (ref 8–23)
CO2: 26 mmol/L (ref 22–32)
Calcium: 8.3 mg/dL — ABNORMAL LOW (ref 8.9–10.3)
Chloride: 108 mmol/L (ref 98–111)
Creatinine, Ser: 0.77 mg/dL (ref 0.44–1.00)
GFR, Estimated: >60 mL/min (ref >60)
Glucose, Bld: 107 mg/dL — ABNORMAL HIGH (ref 70–99)
Potassium: 3.6 mmol/L (ref 3.5–5.1)
Sodium: 140 mmol/L (ref 135–145)

## 2023-06-02 NOTE — Plan of Care (Signed)
Pt is NPO for bowel rest, but she's been given ambien every night since order was placed.  She said she would not be able to sleep without it. So we gave it tonight so pt could rest.

## 2023-06-02 NOTE — TOC CM/SW Note (Signed)
Transition of Care [facility]) - Inpatient Brief Assessment   Patient Details  Name: Tracy Chase MRN: [id number] Date of Birth: January 07, 1956  Transition of Care [facility] CM/SW Contact:    Garret Reddish, RN Phone Number: 06/02/2023, 11:24 AM   Clinical Narrative:   Chart reviewed.  Noted that patient was admitted with Perforated duodenal diverticulitis.  Patient continues current management with antibiotics, IV fluid,  an remains n.p.o. Patient scheduled for Upper GI Seris on tomorrow.    No TOC identified at this time.    Transition of Care Asessment: Insurance and Status: Insurance coverage has been reviewed Patient has primary care physician: Yes Home environment has been reviewed: Reviwed Prior level of function:: Reviewed Prior/Current Home Services: No current home services Social Determinants of Health Reivew: SDOH reviewed no interventions necessary Readmission risk has been reviewed: Yes Transition of care needs: no transition of care needs at this time

## 2023-06-02 NOTE — Plan of Care (Signed)

## 2023-06-02 NOTE — Progress Notes (Signed)
Subjective:  CC: Tracy Chase is a 67 y.o. female  Hospital stay day 2,   perforated duodenal diverticulitis  HPI: No acute issues overnight.   ROS:  General: Denies weight loss, weight gain, fatigue, fevers, chills, and night sweats. Heart: Denies chest pain, palpitations, racing heart, irregular heartbeat, leg pain or swelling, and decreased activity tolerance. Respiratory: Denies breathing difficulty, shortness of breath, wheezing, cough, and sputum. GI: Denies change in appetite, heartburn, nausea, vomiting, constipation, diarrhea, and blood in stool. GU: Denies difficulty urinating, pain with urinating, urgency, frequency, blood in urine.   Objective:   Temp:  [97.5 F (36.4 C)-98.1 F (36.7 C)] 97.5 F (36.4 C) (07/21 0836) Pulse Rate:  [58-68] 59 (07/21 0836) Resp:  [16-20] 16 (07/21 0836) BP: (114-123)/(65-69) 116/69 (07/21 0836) SpO2:  [100 %] 100 % (07/21 0836)     Height: 5\' 6"  (167.6 cm) Weight: 80.7 kg BMI (Calculated): 28.74   Intake/Output this shift:   Intake/Output Summary (Last 24 hours) at 06/02/2023 1049 Last data filed at 06/02/2023 1044 Gross per 24 hour  Intake 2635.41 ml  Output --  Net 2635.41 ml    Constitutional :  alert, cooperative, appears stated age, and no distress  Respiratory:  clear to auscultation bilaterally  Cardiovascular:  regular rate and rhythm  Gastrointestinal: Soft, no guarding, tenderness to palpation improved right upper quadrant and midepigastric area .   Skin: Cool and moist.   Psychiatric: Normal affect, non-agitated, not confused       LABS:     Latest Ref Rng & Units 06/02/2023    6:05 AM 06/01/2023    5:49 AM 05/31/2023    1:24 PM  CMP  Glucose 70 - 99 mg/dL 147  829  562   BUN 8 - 23 mg/dL <5  6  <5   Creatinine 0.44 - 1.00 mg/dL 1.30  8.65  7.84   Sodium 135 - 145 mmol/L 140  137  134   Potassium 3.5 - 5.1 mmol/L 3.6  3.4  3.3   Chloride 98 - 111 mmol/L 108  103  95   CO2 22 - 32 mmol/L 26  28  27    Calcium  8.9 - 10.3 mg/dL 8.3  8.1  8.5   Total Protein 6.5 - 8.1 g/dL   6.7   Total Bilirubin 0.3 - 1.2 mg/dL   0.3   Alkaline Phos 38 - 126 U/L   70   AST 15 - 41 U/L   11   ALT 0 - 44 U/L   10       Latest Ref Rng & Units 06/02/2023    6:05 AM 06/01/2023    5:49 AM 05/31/2023    1:24 PM  CBC  WBC 4.0 - 10.5 K/uL 3.5  6.5  8.8   Hemoglobin 12.0 - 15.0 g/dL 69.6  29.5  28.4   Hematocrit 36.0 - 46.0 % 32.3  31.8  38.4   Platelets 150 - 400 K/uL 297  268  301     RADS: N/A Assessment:   Perforated duodenal diverticulitis Stable.  Continue current management with antibiotics, IV fluid, n.p.o. DVT prophylaxis with Lovenox.  Upper GI series test scheduled for tomorrow morning.  All questions addressed.  Reassured patient holding home medications for the next day or 2 would not have a adverse effect long-term.  labs/images/medications/previous chart entries reviewed personally and relevant changes/updates noted above.

## 2023-06-03 ENCOUNTER — Inpatient Hospital Stay: Payer: Medicare Other

## 2023-06-03 DIAGNOSIS — K5700 Diverticulitis of small intestine with perforation and abscess without bleeding: Secondary | ICD-10-CM | POA: Diagnosis not present

## 2023-06-03 LAB — BASIC METABOLIC PANEL
Anion gap: 9 (ref 5–15)
BUN: 6 mg/dL — ABNORMAL LOW (ref 8–23)
CO2: 24 mmol/L (ref 22–32)
Calcium: 8.4 mg/dL — ABNORMAL LOW (ref 8.9–10.3)
Chloride: 107 mmol/L (ref 98–111)
Creatinine, Ser: 0.77 mg/dL (ref 0.44–1.00)
GFR, Estimated: >60 mL/min (ref >60)
Glucose, Bld: 104 mg/dL — ABNORMAL HIGH (ref 70–99)
Potassium: 3.5 mmol/L (ref 3.5–5.1)
Sodium: 140 mmol/L (ref 135–145)

## 2023-06-03 LAB — CBC
HCT: 32.9 % — ABNORMAL LOW (ref 36.0–46.0)
Hemoglobin: 11 g/dL — ABNORMAL LOW (ref 12.0–15.0)
MCH: 30.1 pg (ref 26.0–34.0)
MCHC: 33.4 g/dL (ref 30.0–36.0)
MCV: 89.9 fL (ref 80.0–100.0)
Platelets: 307 10*3/uL (ref 150–400)
RBC: 3.66 MIL/uL — ABNORMAL LOW (ref 3.87–5.11)
RDW: 11.6 % (ref 11.5–15.5)
WBC: 3.6 10*3/uL — ABNORMAL LOW (ref 4.0–10.5)
nRBC: 0 % (ref 0.0–0.2)

## 2023-06-03 MED ORDER — DOCUSATE SODIUM 100 MG PO CAPS: 100.0000 mg | ORAL_CAPSULE | Freq: Every day | ORAL | Status: DC | PRN

## 2023-06-03 MED ORDER — ZOLPIDEM TARTRATE 5 MG PO TABS
5.0000 mg | ORAL_TABLET | Freq: Every day | ORAL | Status: DC
  Administered 2023-06-03 – 2023-06-05 (×3): 5 mg via ORAL
  Filled 2023-06-03 (×3): qty 1

## 2023-06-03 MED ORDER — TRAMADOL HCL 50 MG PO TABS
50.0000 mg | ORAL_TABLET | Freq: Four times a day (QID) | ORAL | Status: DC | PRN
  Administered 2023-06-04: 50 mg via ORAL
  Filled 2023-06-03: qty 1

## 2023-06-03 MED ORDER — FLUOXETINE HCL 20 MG PO CAPS
20.0000 mg | ORAL_CAPSULE | Freq: Every day | ORAL | Status: DC
  Administered 2023-06-03 – 2023-06-06 (×4): 20 mg via ORAL
  Filled 2023-06-03 (×4): qty 1

## 2023-06-03 MED ORDER — IOHEXOL 300 MG/ML  SOLN
150.0000 mL | Freq: Once | INTRAMUSCULAR | Status: AC | PRN
  Administered 2023-06-03: 150 mL via ORAL

## 2023-06-03 MED ORDER — ALPRAZOLAM 0.25 MG PO TABS: 0.2500 mg | ORAL_TABLET | Freq: Two times a day (BID) | ORAL | Status: DC | PRN

## 2023-06-03 MED ORDER — GABAPENTIN 300 MG PO CAPS
300.0000 mg | ORAL_CAPSULE | Freq: Every day | ORAL | Status: DC
  Filled 2023-06-03 (×3): qty 1

## 2023-06-03 MED ORDER — TRIAMTERENE-HCTZ 37.5-25 MG PO TABS
1.0000 | ORAL_TABLET | Freq: Every day | ORAL | Status: DC
  Administered 2023-06-03 – 2023-06-06 (×4): 1 via ORAL
  Filled 2023-06-03 (×4): qty 1

## 2023-06-03 NOTE — Progress Notes (Signed)
[facility] SURGICAL PROGRESS NOTE (cpt [phone])  Hospital Day(s): 3.   Interval History: Patient seen and examined, no acute events or new complaints overnight. Patient reports she still has upper abdominal discomfort. Intermittent nausea. No fever, chills, emesis. Mild leukopenia to 3.6K. Hgb to 11.0; stable. Renal function normal; sCr - 0.77; OU - unmeasured x3. No electrolyte derangements. She is NPO. Plan for UGI this morning. Continues on Zosyn   Review of Systems:  Constitutional: denies fever, chills  HEENT: denies cough or congestion  Respiratory: denies any shortness of breath  Cardiovascular: denies chest pain or palpitations  Gastrointestinal: + Abdominal pain; + nausea (intermittent), no emesis  Genitourinary: denies burning with urination or urinary frequency Musculoskeletal: denies pain, decreased motor or sensation  Vital signs in last 24 hours: [min-max] current  Temp:  [97.5 F (36.4 C)-97.9 F (36.6 C)] 97.8 F (36.6 C) (07/22 0451) Pulse Rate:  [46-59] 49 (07/22 0451) Resp:  [16] 16 (07/22 0451) BP: (116-136)/(63-86) 134/86 (07/22 0451) SpO2:  [95 %-100 %] 95 % (07/22 0451)     Height: 5\' 6"  (167.6 cm) Weight: 80.7 kg BMI (Calculated): 28.74   Intake/Output last 2 shifts:  07/21 0701 - 07/22 0700 In: 898.7 [I.V.:849.1; IV Piggyback:49.7] Out: -    Physical Exam:  Constitutional: alert, cooperative and no distress  HENT: normocephalic without obvious abnormality  Eyes: PERRL, EOM's grossly intact and symmetric  Respiratory: breathing non-labored at rest  Cardiovascular: regular rate and sinus rhythm  Gastrointestinal: soft, RUQ tenderness, and non-distended, no rebound/guarding. She is certainly without peritonitis  Musculoskeletal: no edema or wounds, motor and sensation grossly intact, NT    Labs:     Latest Ref Rng & Units 06/03/2023    6:23 AM 06/02/2023    6:05 AM 06/01/2023    5:49 AM  CBC  WBC 4.0 - 10.5 K/uL 3.6  3.5  6.5    Hemoglobin 12.0 - 15.0 g/dL 60.4  54.0  98.1   Hematocrit 36.0 - 46.0 % 32.9  32.3  31.8   Platelets 150 - 400 K/uL 307  297  268       Latest Ref Rng & Units 06/03/2023    6:23 AM 06/02/2023    6:05 AM 06/01/2023    5:49 AM  CMP  Glucose 70 - 99 mg/dL 191  478  295   BUN 8 - 23 mg/dL 6  <5  6   Creatinine 6.21 - 1.00 mg/dL 3.08  6.57  8.46   Sodium 135 - 145 mmol/L 140  140  137   Potassium 3.5 - 5.1 mmol/L 3.5  3.6  3.4   Chloride 98 - 111 mmol/L 107  108  103   CO2 22 - 32 mmol/L 24  26  28    Calcium 8.9 - 10.3 mg/dL 8.4  8.3  8.1      Imaging studies: No new pertinent imaging studies   Assessment/Plan: (ICD-10's: K57.00) 67 y.o. female with contained duodenal perforation   - Pending UGI this morning to reassess duodenal perforation. If reassuring, we can stat CLD cautiously. If this continues to show perforation, will need TPN anticipating prolonged NPO status. May also need transfer to higher level of care of surgical evaluation given its location and complexity of potential repair.     - NPO pending UGI this morning  - No emergent surgical intervention; follow up UGI  - Continue IV Abx (Zosyn)   - Monitor abdominal examination; on-going bowel function   - Pain  control prn; antiemetics prn - Hold home medications for now; restart if UGI reassuring - Mobilize as tolerated   All of the above findings and recommendations were discussed with the patient, and the medical team, and all of patient's questions were answered to her expressed satisfaction.  -- Lynden Oxford, PA-C [facility] 06/03/2023, 7:22 AM M-F: 7am - 4pm

## 2023-06-03 NOTE — Care Management Important Message (Signed)
Important Message  Patient Details  Name: Tracy Chase MRN: [id number] Date of Birth: 02/04/1956   Medicare Important Message Given:  Yes     Johnell Comings 06/03/2023, 11:07 AM

## 2023-06-04 DIAGNOSIS — K5700 Diverticulitis of small intestine with perforation and abscess without bleeding: Secondary | ICD-10-CM | POA: Diagnosis not present

## 2023-06-04 LAB — CBC
HCT: 35.3 % — ABNORMAL LOW (ref 36.0–46.0)
Hemoglobin: 11.8 g/dL — ABNORMAL LOW (ref 12.0–15.0)
MCH: 30.1 pg (ref 26.0–34.0)
MCHC: 33.4 g/dL (ref 30.0–36.0)
MCV: 90.1 fL (ref 80.0–100.0)
Platelets: 370 10*3/uL (ref 150–400)
RBC: 3.92 MIL/uL (ref 3.87–5.11)
RDW: 11.6 % (ref 11.5–15.5)
WBC: 3.9 10*3/uL — ABNORMAL LOW (ref 4.0–10.5)
nRBC: 0 % (ref 0.0–0.2)

## 2023-06-04 LAB — BASIC METABOLIC PANEL
Anion gap: 6 (ref 5–15)
BUN: <5 mg/dL — ABNORMAL LOW (ref 8–23)
CO2: 27 mmol/L (ref 22–32)
Calcium: 8.6 mg/dL — ABNORMAL LOW (ref 8.9–10.3)
Chloride: 107 mmol/L (ref 98–111)
Creatinine, Ser: 0.83 mg/dL (ref 0.44–1.00)
GFR, Estimated: >60 mL/min (ref >60)
Glucose, Bld: 99 mg/dL (ref 70–99)
Potassium: 4.1 mmol/L (ref 3.5–5.1)
Sodium: 140 mmol/L (ref 135–145)

## 2023-06-04 MED ORDER — OXYCODONE HCL 5 MG PO TABS: 5.0000 mg | ORAL_TABLET | ORAL | Status: DC | PRN

## 2023-06-04 NOTE — Progress Notes (Signed)
Pt complaining of increased pain: 7/10 RUQ s/p full liquid diet advancement. Having "some liquid" stool. MD made aware. Diet changed to clear liquid.

## 2023-06-04 NOTE — Progress Notes (Signed)
[facility] SURGICAL PROGRESS NOTE (cpt [phone])  Hospital Day(s): 4.   Interval History: Patient seen and examined, no acute events or new complaints overnight. Patient reports she still with right upper quadrant soreness; improved some. No fever, chills, nausea, emesis. Mild leukopenia to 3.9K. Hgb to 11.8; stable. Renal function normal; sCr - 0.83; OU - unmeasured. No electrolyte derangements. She is on CLD; tolerated. UGI yesterday was without evidence of duodenal leak/perforation. She continues on Zosyn.   Review of Systems:  Constitutional: denies fever, chills  HEENT: denies cough or congestion  Respiratory: denies any shortness of breath  Cardiovascular: denies chest pain or palpitations  Gastrointestinal: + Abdominal pain; denied nausea, no emesis  Genitourinary: denies burning with urination or urinary frequency Musculoskeletal: denies pain, decreased motor or sensation  Vital signs in last 24 hours: [min-max] current  Temp:  [97.7 F (36.5 C)-98.2 F (36.8 C)] 97.8 F (36.6 C) (07/23 0408) Pulse Rate:  [51-67] 57 (07/23 0408) Resp:  [15-18] 18 (07/23 0408) BP: (107-132)/(67-79) 132/79 (07/23 0408) SpO2:  [98 %-100 %] 98 % (07/23 0408)     Height: 5\' 6"  (167.6 cm) Weight: 80.7 kg BMI (Calculated): 28.74   Intake/Output last 2 shifts:  07/22 0701 - 07/23 0700 In: 480 [P.O.:480] Out: -    Physical Exam:  Constitutional: alert, cooperative and no distress  HENT: normocephalic without obvious abnormality  Eyes: PERRL, EOM's grossly intact and symmetric  Respiratory: breathing non-labored at rest  Cardiovascular: regular rate and sinus rhythm  Gastrointestinal: soft, RUQ soreness, and non-distended, no rebound/guarding. She is certainly without peritonitis  Musculoskeletal: no edema or wounds, motor and sensation grossly intact, NT    Labs:     Latest Ref Rng & Units 06/04/2023    4:31 AM 06/03/2023    6:23 AM 06/02/2023    6:05 AM  CBC  WBC 4.0 - 10.5  K/uL 3.9  3.6  3.5   Hemoglobin 12.0 - 15.0 g/dL 95.6  21.3  08.6   Hematocrit 36.0 - 46.0 % 35.3  32.9  32.3   Platelets 150 - 400 K/uL 370  307  297       Latest Ref Rng & Units 06/04/2023    4:31 AM 06/03/2023    6:23 AM 06/02/2023    6:05 AM  CMP  Glucose 70 - 99 mg/dL 99  578  469   BUN 8 - 23 mg/dL <5  6  <5   Creatinine 0.44 - 1.00 mg/dL 6.29  5.28  4.13   Sodium 135 - 145 mmol/L 140  140  140   Potassium 3.5 - 5.1 mmol/L 4.1  3.5  3.6   Chloride 98 - 111 mmol/L 107  107  108   CO2 22 - 32 mmol/L 27  24  26    Calcium 8.9 - 10.3 mg/dL 8.6  8.4  8.3      Imaging studies: No new pertinent imaging studies   Assessment/Plan: (ICD-10's: K57.00) 67 y.o. female with contained duodenal perforation   - Advance to FLD today. Pending clinical condition, may do soft diet tomorrow (07/24)  - No emergent surgical intervention  - Continue IV Abx (Zosyn)   - Monitor abdominal examination; on-going bowel function   - Pain control prn; antiemetics prn - Resume home medications - Mobilize as tolerated    - Discharge Planning; Doing well clinically, UGI reassuring. Cautiously advancing diet. Anticipate home in 24-48 hours pending clinical condition.   All of the above findings and recommendations were  discussed with the patient, and the medical team, and all of patient's questions were answered to her expressed satisfaction.  -- Lynden Oxford, PA-C [facility] 06/04/2023, 7:27 AM M-F: 7am - 4pm

## 2023-06-05 DIAGNOSIS — K5700 Diverticulitis of small intestine with perforation and abscess without bleeding: Secondary | ICD-10-CM | POA: Diagnosis not present

## 2023-06-05 LAB — CBC
HCT: 34.7 % — ABNORMAL LOW (ref 36.0–46.0)
Hemoglobin: 11.4 g/dL — ABNORMAL LOW (ref 12.0–15.0)
MCH: 30 pg (ref 26.0–34.0)
MCHC: 32.9 g/dL (ref 30.0–36.0)
MCV: 91.3 fL (ref 80.0–100.0)
Platelets: 359 10*3/uL (ref 150–400)
RBC: 3.8 MIL/uL — ABNORMAL LOW (ref 3.87–5.11)
RDW: 11.6 % (ref 11.5–15.5)
WBC: 4.3 10*3/uL (ref 4.0–10.5)
nRBC: 0 % (ref 0.0–0.2)

## 2023-06-05 LAB — BASIC METABOLIC PANEL
Anion gap: 6 (ref 5–15)
BUN: <5 mg/dL — ABNORMAL LOW (ref 8–23)
CO2: 27 mmol/L (ref 22–32)
Calcium: 8.6 mg/dL — ABNORMAL LOW (ref 8.9–10.3)
Chloride: 106 mmol/L (ref 98–111)
Creatinine, Ser: 0.93 mg/dL (ref 0.44–1.00)
GFR, Estimated: >60 mL/min (ref >60)
Glucose, Bld: 96 mg/dL (ref 70–99)
Potassium: 3.9 mmol/L (ref 3.5–5.1)
Sodium: 139 mmol/L (ref 135–145)

## 2023-06-05 MED ORDER — PANTOPRAZOLE SODIUM 40 MG PO TBEC
40.0000 mg | DELAYED_RELEASE_TABLET | Freq: Two times a day (BID) | ORAL | Status: DC
  Administered 2023-06-05 – 2023-06-06 (×3): 40 mg via ORAL
  Filled 2023-06-05 (×3): qty 1

## 2023-06-05 NOTE — Progress Notes (Signed)
[facility] SURGICAL PROGRESS NOTE (cpt [phone])  Hospital Day(s): 5.   Interval History: Patient seen and examined. Late in the day with increase in abdominal pain and was backed down to CLD. This morning, she reports feeling much better. Still with abdominal soreness but only with palpation. No fever, chills, nausea, emesis. She remains without leukocytosis; WBC 4.3K. Hgb to 11.4. Renal function normal; sCr - 0.93: UO - unmeasured. No electrolyte derangements. She is on CLD; tolerated. She continues on Zosyn.   Review of Systems:  Constitutional: denies fever, chills  HEENT: denies cough or congestion  Respiratory: denies any shortness of breath  Cardiovascular: denies chest pain or palpitations  Gastrointestinal: + Abdominal pain (improved); denied nausea, no emesis  Genitourinary: denies burning with urination or urinary frequency Musculoskeletal: denies pain, decreased motor or sensation  Vital signs in last 24 hours: [min-max] current  Temp:  [97.5 F (36.4 C)-97.7 F (36.5 C)] 97.6 F (36.4 C) (07/24 0424) Pulse Rate:  [44-58] 44 (07/24 0424) Resp:  [18] 18 (07/24 0424) BP: (115-123)/(66-96) 123/73 (07/24 0424) SpO2:  [99 %-100 %] 99 % (07/24 0424)     Height: 5\' 6"  (167.6 cm) Weight: 80.7 kg BMI (Calculated): 28.74   Intake/Output last 2 shifts:  07/23 0701 - 07/24 0700 In: 240 [P.O.:240] Out: -    Physical Exam:  Constitutional: alert, cooperative and no distress  HENT: normocephalic without obvious abnormality  Eyes: PERRL, EOM's grossly intact and symmetric  Respiratory: breathing non-labored at rest  Cardiovascular: regular rate and sinus rhythm  Gastrointestinal: soft, RUQ soreness with palpation, and non-distended, no rebound/guarding. She is certainly without peritonitis  Musculoskeletal: no edema or wounds, motor and sensation grossly intact, NT    Labs:     Latest Ref Rng & Units 06/04/2023    4:31 AM 06/03/2023    6:23 AM 06/02/2023    6:05  AM  CBC  WBC 4.0 - 10.5 K/uL 3.9  3.6  3.5   Hemoglobin 12.0 - 15.0 g/dL 78.2  95.6  21.3   Hematocrit 36.0 - 46.0 % 35.3  32.9  32.3   Platelets 150 - 400 K/uL 370  307  297       Latest Ref Rng & Units 06/04/2023    4:31 AM 06/03/2023    6:23 AM 06/02/2023    6:05 AM  CMP  Glucose 70 - 99 mg/dL 99  086  578   BUN 8 - 23 mg/dL <5  6  <5   Creatinine 0.44 - 1.00 mg/dL 4.69  6.29  5.28   Sodium 135 - 145 mmol/L 140  140  140   Potassium 3.5 - 5.1 mmol/L 4.1  3.5  3.6   Chloride 98 - 111 mmol/L 107  107  108   CO2 22 - 32 mmol/L 27  24  26    Calcium 8.9 - 10.3 mg/dL 8.6  8.4  8.3      Imaging studies: No new pertinent imaging studies   Assessment/Plan: (ICD-10's: K57.00) 67 y.o. female with contained duodenal perforation   - CLD this morning; Okay to do FLD this afternoon if she does well. Soft diet tomorrow   - No emergent surgical intervention  - Continue IV Abx (Zosyn)   - Monitor abdominal examination; on-going bowel function   - Pain control prn; antiemetics prn - Resume home medications - Mobilize as tolerated    - Discharge Planning; Attempts at diet advancement again today; otherwise doing well. Potentially home in next 24  hours if tolerates diet advancement today.   All of the above findings and recommendations were discussed with the patient, and the medical team, and all of patient's questions were answered to her expressed satisfaction.  -- Lynden Oxford, PA-C [facility] 06/05/2023, 7:19 AM M-F: 7am - 4pm

## 2023-06-05 NOTE — Plan of Care (Signed)

## 2023-06-05 NOTE — Progress Notes (Signed)
PHARMACIST - PHYSICIAN COMMUNICATION  CONCERNING: IV to Oral Route Change Policy  RECOMMENDATION: This patient is receiving pantoprazole by the intravenous route.  Based on criteria approved by the Pharmacy and Therapeutics Committee, the intravenous medication(s) is/are being converted to the equivalent oral dose form(s).  DESCRIPTION: These criteria include: The patient is eating (either orally or via tube) and/or has been taking other orally administered medications for a least 24 hours The patient has no evidence of active gastrointestinal bleeding or impaired GI absorption (gastrectomy, short bowel, patient on TNA or NPO).  If you have questions about this conversion, please contact the Pharmacy Department    Tressie Ellis, [facility] 06/05/2023 7:45 AM

## 2023-06-06 DIAGNOSIS — K5700 Diverticulitis of small intestine with perforation and abscess without bleeding: Secondary | ICD-10-CM | POA: Diagnosis not present

## 2023-06-06 LAB — CBC
HCT: 36.1 % (ref 36.0–46.0)
Hemoglobin: 11.9 g/dL — ABNORMAL LOW (ref 12.0–15.0)
MCH: 30.4 pg (ref 26.0–34.0)
MCHC: 33 g/dL (ref 30.0–36.0)
MCV: 92.1 fL (ref 80.0–100.0)
Platelets: 372 10*3/uL (ref 150–400)
RBC: 3.92 MIL/uL (ref 3.87–5.11)
RDW: 11.8 % (ref 11.5–15.5)
WBC: 5.2 10*3/uL (ref 4.0–10.5)
nRBC: 0 % (ref 0.0–0.2)

## 2023-06-06 LAB — BASIC METABOLIC PANEL
Anion gap: 8 (ref 5–15)
BUN: <5 mg/dL — ABNORMAL LOW (ref 8–23)
CO2: 27 mmol/L (ref 22–32)
Chloride: 106 mmol/L (ref 98–111)
Creatinine, Ser: 1.05 mg/dL — ABNORMAL HIGH (ref 0.44–1.00)
GFR, Estimated: 58 mL/min — ABNORMAL LOW (ref >60)
Potassium: 4.2 mmol/L (ref 3.5–5.1)

## 2023-06-06 MED ORDER — PANTOPRAZOLE SODIUM 40 MG PO TBEC: 40.0000 mg | DELAYED_RELEASE_TABLET | Freq: Two times a day (BID) | ORAL | 0 refills | Status: DC

## 2023-06-06 MED ORDER — SODIUM CHLORIDE 0.9 % IV BOLUS
1000.0000 mL | Freq: Once | INTRAVENOUS | Status: AC
  Administered 2023-06-06: 1000 mL via INTRAVENOUS

## 2023-06-06 MED ORDER — AMOXICILLIN-POT CLAVULANATE 875-125 MG PO TABS: 1.0000 | ORAL_TABLET | Freq: Two times a day (BID) | ORAL | 0 refills | Status: AC

## 2023-06-06 NOTE — Plan of Care (Signed)

## 2023-06-06 NOTE — Discharge Summary (Signed)
[facility] SURGICAL DISCHARGE SUMMARY (cpt: [phone])  Patient ID: Tracy Chase MRN: [id number] DOB/AGE: September 25, 1956 67 y.o.  Admit date: 05/31/2023 Discharge date: 06/06/2023  Discharge Diagnoses Patient Active Problem List   Diagnosis Date Noted   Diverticulitis of small intestine with perforation and abscess 05/31/2023    Consultants None  Procedures None  HPI: Tracy Chase is a 67 y.o. female with RUQ abdominal pain.  She was seen in the office on 05/29/23 with a 4-5 day history at that point of RUQ abdominal pain.  She had an ultrasound that showed cholelithiasis and gallbladder polyps, but without any inflammatory changes.  Furthermore, her pain seemed to be more widespread between RUQ, some RLQ, right flank, and back.  As such, she had a CT scan done yesterday of the abdomen and pelvis.  I have personally viewed the images and discussed with Dr. Alfredo Batty with radiology.  The patient appears to have a contained posterior duodenal perforation, possibly related to a duodenal diverticulum that has been previously seen on CT scan images.  The perforation has resulted in a 3.4 cm fluid collection, but no free air.  There is surrounding stranding in the retroperitoneum.  Due to the findings, I called the patient and advised her to come to the ER for admission. The patient reports that she's had continued RUQ pain, which is worse at night time.  She also feels more bloated after eating, but denies any fevers or chills, and the pain has overall not worsened over the past two days.    Hospital Course: Patient was direct admitted to [facility] and underwent conservative management. She was NPO x3 days. She underwent UGI on 07/22 which did not show any continued evidence of duodenal perforation/leak. Diet was restarted and gradually advanced over the next 3 days. On day of discharge, advancement of patient's diet and ambulation were well-tolerated. The remainder of patient's hospital course  was essentially unremarkable, and discharge planning was initiated accordingly with patient safely able to be discharged home with appropriate discharge instructions, PPI, antibiotics (Augmentin x8 days to complete 14 total), pain control, and outpatient follow-up after all of her questions were answered to her expressed satisfaction.   Discharge Condition: Good   Physical Examination:  Constitutional: alert, cooperative and no distress  HENT: normocephalic without obvious abnormality  Eyes: PERRL, EOM's grossly intact and symmetric  Respiratory: breathing non-labored at rest  Cardiovascular: regular rate and sinus rhythm  Gastrointestinal: soft, very minimal residual RUQ soreness with palpation, and non-distended, no rebound/guarding. She is certainly without peritonitis  Musculoskeletal: no edema or wounds, motor and sensation grossly intact, NT      Allergies as of 06/06/2023       Reactions   Hydrocodone Itching        Medication List     TAKE these medications    ALPRAZolam 0.25 MG tablet Commonly known as: XANAX Take 1 tablet (0.25 mg total) by mouth 2 (two) times daily as needed for anxiety.   amoxicillin-clavulanate 875-125 MG tablet Commonly known as: AUGMENTIN Take 1 tablet by mouth 2 (two) times daily for 8 days.   docusate sodium 100 MG capsule Commonly known as: COLACE Take 1 capsule (100 mg total) by mouth 2 (two) times daily. What changed:  when to take this reasons to take this   FLUoxetine 20 MG capsule Commonly known as: PROZAC TAKE 1 CAPSULE BY MOUTH DAILY What changed: when to take this   gabapentin 300 MG capsule Commonly known as: NEURONTIN  Take 1 capsule by mouth at bedtime.   pantoprazole 40 MG tablet Commonly known as: PROTONIX Take 1 tablet (40 mg total) by mouth 2 (two) times daily.   traMADol 50 MG tablet Commonly known as: Ultram Take 1 tablet (50 mg total) by mouth every 6 (six) hours as needed for moderate pain.    triamterene-hydrochlorothiazide 37.5-25 MG tablet Commonly known as: MAXZIDE-25 Take 1 tablet by mouth daily.   zolpidem 10 MG tablet Commonly known as: AMBIEN TAKE 1 TABLET BY MOUTH AT  BEDTIME          Follow-up Information     Piscoya, Elita Quick, MD. Schedule an appointment as soon as possible for a visit in 2 week(s).   Specialty: General Surgery Why: Hospital Follow up; duodenal perforation/diverticulitis Contact information: [address] [place] [address] [phone]                  Time spent on discharge management including discussion of hospital course, clinical condition, outpatient instructions, prescriptions, and follow up with the patient and members of the medical team: >30 minutes  -- Lynden Oxford , PA-C [facility]  06/06/2023, 7:55 AM [phone] M-F: 7am - 4pm

## 2023-06-06 NOTE — Care Management Important Message (Signed)
Important Message  Patient Details  Name: Tracy Chase MRN: [id number] Date of Birth: 1956/03/14   Medicare Important Message Given:  Yes     Olegario Messier A Novelle Addair 06/06/2023, 9:13 AM

## 2023-06-06 NOTE — Discharge Instructions (Signed)
In addition to included general post-operative instructions,  Diet: Resume home diet, limit or avoid acidic food.   Call office [phone]) at any time if any questions, worsening pain, fevers/chills, bleeding, drainage from incision site, or other concerns.

## 2023-06-06 NOTE — Plan of Care (Signed)

## 2023-06-07 ENCOUNTER — Telehealth: Payer: Self-pay | Admitting: *Deleted

## 2023-06-07 NOTE — Transitions of Care (Post Inpatient/ED Visit) (Signed)
06/07/2023  Name: Tracy Chase MRN: [id number] DOB: 07-28-1956  Today's TOC FU Call Status: Today's TOC FU Call Status:: Successful TOC FU Call Competed TOC FU Call Complete Date: 06/07/23  Transition Care Management Follow-up Telephone Call Date of Discharge: 06/06/23 Type of Discharge: Inpatient Admission Primary Inpatient Discharge Diagnosis:: Diverticulitis of small intestine with perforation and abscess How have you been since you were released from the hospital?: Better Any questions or concerns?: No  Items Reviewed: Did you receive and understand the discharge instructions provided?: Yes Medications obtained,verified, and reconciled?: Yes (Medications Reviewed) Any new allergies since your discharge?: No Dietary orders reviewed?: Yes Type of Diet Ordered:: avoid acidic foods (RN gave examples of foods) Do you have support at home?: Yes Name of Support/Comfort Primary Source: Tracy Chase  Medications Reviewed Today: Medications Reviewed Today     Reviewed by Luella Cook, RN (Case Manager) on 06/07/23 at 1022  Med List Status: <None>   Medication Order Taking? Sig Documenting Provider Last Dose Status Informant  ALPRAZolam (XANAX) 0.25 MG tablet [id number] Yes Take 1 tablet (0.25 mg total) by mouth 2 (two) times daily as needed for anxiety. Reubin Milan, MD Taking Active   amoxicillin-clavulanate (AUGMENTIN) 875-125 MG tablet [id number] Yes Take 1 tablet by mouth 2 (two) times daily for 8 days. Donovan Kail, PA-C Taking Active   docusate sodium (COLACE) 100 MG capsule [id number] Yes Take 1 capsule (100 mg total) by mouth 2 (two) times daily.  Patient taking differently: Take 100 mg by mouth daily as needed.   Evon Slack, PA-C Taking Active   FLUoxetine (PROZAC) 20 MG capsule [id number] Yes TAKE 1 CAPSULE BY MOUTH DAILY  Patient taking differently: Take 20 mg by mouth every morning.   Reubin Milan, MD Taking Active   gabapentin (NEURONTIN) 300 MG  capsule [id number] Yes Take 1 capsule by mouth at bedtime. [provider] Taking Active   pantoprazole (PROTONIX) 40 MG tablet [id number] Yes Take 1 tablet (40 mg total) by mouth 2 (two) times daily. Donovan Kail, PA-C Taking Active   traMADol Janean Sark) 50 MG tablet [id number] Yes Take 1 tablet (50 mg total) by mouth every 6 (six) hours as needed for moderate pain. Henrene Dodge, MD Taking Active   triamterene-hydrochlorothiazide [facility]) 37.5-25 MG tablet [id number] Yes Take 1 tablet by mouth daily. Reubin Milan, MD Taking Active   zolpidem [facility]) 10 MG tablet [id number] Yes TAKE 1 TABLET BY MOUTH AT  BEDTIME Reubin Milan, MD Taking Active             Home Care and Equipment/Supplies: Were Home Health Services Ordered?: NA Any new equipment or medical supplies ordered?: NA  Functional Questionnaire: Do you need assistance with bathing/showering or dressing?: Yes Do you need assistance with meal preparation?: Yes Do you need assistance with eating?: No Do you have difficulty maintaining continence: No Do you need assistance with getting out of bed/getting out of a chair/moving?: No Do you have difficulty managing or taking your medications?: No  Follow up appointments reviewed: PCP Follow-up appointment confirmed?: NA Specialist Hospital Follow-up appointment confirmed?: Yes Date of Specialist follow-up appointment?: 06/21/23 Follow-Up Specialty Provider:: Dr Aleen Campi Do you need transportation to your follow-up appointment?: No Do you understand care options if your condition(s) worsen?: Yes-patient verbalized understanding  SDOH Interventions Today    Flowsheet Row Most Recent Value  SDOH Interventions   Food Insecurity Interventions Intervention Not Indicated  Housing Interventions Intervention Not Indicated  Transportation Interventions  Intervention Not Indicated, Patient Resources (Friends/Family)      Interventions Today    Flowsheet Row Most  Recent Value  General Interventions   General Interventions Discussed/Reviewed General Interventions Discussed, General Interventions Reviewed, Doctor Visits  Doctor Visits Discussed/Reviewed Specialist  PCP/Specialist Visits Compliance with follow-up visit  Nutrition Interventions   Nutrition Discussed/Reviewed Nutrition Discussed  [discussed foods to avoid that are acidic]  Pharmacy Interventions   Pharmacy Dicussed/Reviewed Pharmacy Topics Discussed  [reiterated to take all atbx]      TOC Interventions Today    Flowsheet Row Most Recent Value  TOC Interventions   TOC Interventions Discussed/Reviewed TOC Interventions Discussed, TOC Interventions Reviewed       Gean Maidens BSN RN [facility] [phone]

## 2023-06-21 ENCOUNTER — Encounter: Payer: Self-pay | Admitting: Surgery

## 2023-06-21 ENCOUNTER — Ambulatory Visit (INDEPENDENT_AMBULATORY_CARE_PROVIDER_SITE_OTHER): Payer: Medicare Other | Admitting: Surgery

## 2023-06-21 VITALS — BP 120/81 | HR 59 | Temp 98.3°F | Ht 66.0 in | Wt 170.0 lb

## 2023-06-21 DIAGNOSIS — R1010 Upper abdominal pain, unspecified: Secondary | ICD-10-CM

## 2023-06-21 DIAGNOSIS — K265 Chronic or unspecified duodenal ulcer with perforation: Secondary | ICD-10-CM | POA: Diagnosis not present

## 2023-06-21 MED ORDER — PANTOPRAZOLE SODIUM 40 MG PO TBEC: 40.0000 mg | DELAYED_RELEASE_TABLET | Freq: Every day | ORAL | 2 refills | Status: DC

## 2023-06-21 NOTE — Patient Instructions (Addendum)
We will send a referral to [facility] to see you for an Upper Endoscopy. They will call you to schedule this appointment.  You will take the Protonix twice a day for the first month and then 1 time a day after that.   You may go ahead and advance your diet as tolerated.    Follow-up with our office as needed.  Please call and ask to speak with a nurse if you develop questions or concerns.    Ulcer  An ulcer is a painful sore in the lining of your stomach or the first part of your small intestine. What are the causes? Common causes of this condition include: An infection. Using certain pain medicines too often or too much. Rare tumors in the stomach, small intestine, or pancreas. What increases the risk? You are more likely to get this condition if you: Smoke. Have a family history of ulcer disease. Drink alcohol. Have been hospitalized in an intensive care unit (ICU). What are the signs or symptoms? Symptoms include: Burning pain in the area between the chest and the belly button. The pain may: Not go away (be persistent). Be worse when your stomach is empty. Be worse at night. Heartburn. Feeling sick to your stomach (nauseous) and throwing up (vomiting). Bloating. If the ulcer results in bleeding, it can cause you to: Have poop (stool) that is black and looks like tar. Throw up bright red blood. Throw up material that looks like coffee grounds. How is this treated? Treatment for this condition may include: Stopping things that can cause the ulcer, such as: Smoking. Using pain medicines. Drinking alcohol or caffeine. Medicines to reduce stomach acid. Antibiotic medicines if the ulcer is caused by an infection. A procedure that is done using a small, flexible tube that has a camera at the end (upper endoscopy). This may be done if you have a bleeding ulcer. Surgery. This may be needed if: You have a lot of bleeding. The ulcer caused a hole somewhere in  the digestive system. Follow these instructions at home: Do not drink alcohol if your doctor tells you not to drink. Limit how much caffeine you take in. Do not smoke or use any products that contain nicotine or tobacco. If you need help quitting, ask your doctor. Take over-the-counter and prescription medicines only as told by your doctor. Do not stop or change your medicines unless you talk with your doctor about it first. Do not take aspirin, ibuprofen, or other NSAIDs unless your doctor told you to do so. Keep all follow-up visits. Contact a doctor if: You do not get better in 7 days after you start treatment. You keep having an upset stomach (indigestion) or heartburn. Get help right away if: You have sudden, sharp pain in your belly (abdomen). You have belly pain that does not go away. You have bloody poop (stool) or black, tarry poop. You throw up blood. It may look like coffee grounds. You feel light-headed or feel like you may pass out (faint). You get weak. You get sweaty or feel sticky and cold to the touch (clammy). These symptoms may be an emergency. Get help right away. Call 911. Do not wait to see if the symptoms will go away. Do not drive yourself to the hospital. Summary Symptoms of a peptic ulcer include burning pain in the area between the chest and the belly button. Do not smoke or use any products that contain nicotine or tobacco. If you need help quitting, ask your  doctor. Take medicines only as told by your doctor. Limit how much alcohol and caffeine you have. Keep all follow-up visits. This information is not intended to replace advice given to you by your health care provider. Make sure you discuss any questions you have with your health care provider. Document Revised: 06/09/2021 Document Reviewed: 06/09/2021 Elsevier Patient Education  2024 [employer].

## 2023-06-21 NOTE — Progress Notes (Signed)
06/21/2023  History of Present Illness: Tracy Chase is a 67 y.o. female presenting for follow-up of her duodenal perforation.  The patient was admitted on 05/31/2023 following an outpatient CT scan done the previous day which showed a posterior collection coming from the second portion of the duodenum measuring 3.4 x 2.8 x 4 cm concerning for a possible perforated duodenal ulcer.  The patient also has a history of a duodenal diverticulum but it is coming in the medial portion of the duodenum.  She was managed conservatively initially with n.p.o. regimen and antibiotics.  She had an upper GI on 06/03/2023 which did not show any evidence of contrast extravasation posteriorly and did show again the diverticulum.  She was eventually discharged on 06/06/2023.  Patient reports today that she has been doing well and denies any worsening pain, nausea, or vomiting.  She has been staying with a soft diet and has been tolerating this well.  Past Medical History: Past Medical History:  Diagnosis Date   Anemia    h/o   Aortic atherosclerosis (HCC)    Arthritis    Cancer (HCC)    RENAL CELL   Depression    GERD (gastroesophageal reflux disease)    OCC NO MEDS   Hypertension    Incisional hernia    Insomnia    Rib lesion    3 x 2.1 x 2 cm expansile right fourth rib posteriorly   Sepsis [facility])      Past Surgical History: Past Surgical History:  Procedure Laterality Date   ABDOMINAL HYSTERECTOMY     APPENDECTOMY  2011   Lap converted to exlap appendectomy, drainage of abd abscesses   CESAREAN SECTION     COLONOSCOPY WITH PROPOFOL N/A 08/28/2019   Procedure: COLONOSCOPY WITH PROPOFOL;  Surgeon: Pasty Spillers, MD;  Location: [facility] ENDOSCOPY;  Service: Endoscopy;  Laterality: N/A;   cryoablation for renal cell carcinoma  2012   ETHMOIDECTOMY Left 09/06/2020   Procedure: TOTAL ETHMOIDECTOMY;  Surgeon: Geanie Logan, MD;  Location: [facility];  Service: ENT;  Laterality: Left;   FRONTAL  SINUS EXPLORATION Left 09/06/2020   Procedure: FRONTAL SINUS EXPLORATION;  Surgeon: Geanie Logan, MD;  Location: [facility];  Service: ENT;  Laterality: Left;   IMAGE GUIDED SINUS SURGERY N/A 09/06/2020   Procedure: IMAGE GUIDED SINUS SURGERY;  Surgeon: Geanie Logan, MD;  Location: [facility];  Service: ENT;  Laterality: N/A;  need stryker disk PLACE DISK ON OR CHARGE NURSE DESK 10-01 KP   INSERTION OF MESH  01/10/2023   Procedure: INSERTION OF MESH;  Surgeon: Henrene Dodge, MD;  Location: [facility] ORS;  Service: General;;   JOINT REPLACEMENT Left    partial knee replacement   LUMBAR FUSION  1994   MAXILLARY ANTROSTOMY Left 09/06/2020   Procedure: MAXILLARY ANTROSTOMY WITH TISSUE;  Surgeon: Geanie Logan, MD;  Location: [facility];  Service: ENT;  Laterality: Left;   MEDIAL PARTIAL KNEE REPLACEMENT Left    SPHENOIDECTOMY Left 09/06/2020   Procedure: SPHENOIDECTOMY;  Surgeon: Geanie Logan, MD;  Location: [facility];  Service: ENT;  Laterality: Left;   TONSILLECTOMY     TOTAL KNEE REVISION Left 02/21/2021   Procedure: Revision, left partial to total knee;  Surgeon: Kennedy Bucker, MD;  Location: [facility] ORS;  Service: Orthopedics;  Laterality: Left;  CHRIS GAINES TO ASSIST   VENTRAL HERNIA REPAIR  2012   open ventral hernia repair with mesh   XI ROBOTIC ASSISTED VENTRAL HERNIA N/A 01/10/2023  Procedure: XI ROBOTIC ASSISTED VENTRAL HERNIA WITH LYSIS OF ADHESIONS;  Surgeon: Henrene Dodge, MD;  Location: [facility] ORS;  Service: General;  Laterality: N/A;    Home Medications: Prior to Admission medications   Medication Sig Start Date End Date Taking? Authorizing Provider  ALPRAZolam (XANAX) 0.25 MG tablet Take 1 tablet (0.25 mg total) by mouth 2 (two) times daily as needed for anxiety. 05/20/23  Yes Reubin Milan, MD  docusate sodium (COLACE) 100 MG capsule Take 1 capsule (100 mg total) by mouth 2 (two) times daily. Patient taking differently: Take 100 mg by mouth  daily as needed. 02/23/21  Yes Evon Slack, PA-C  FLUoxetine (PROZAC) 20 MG capsule TAKE 1 CAPSULE BY MOUTH DAILY Patient taking differently: Take 20 mg by mouth every morning. 11/09/22  Yes Reubin Milan, MD  pantoprazole (PROTONIX) 40 MG tablet Take 1 tablet (40 mg total) by mouth 2 (two) times daily. 06/06/23 07/06/23 Yes Donovan Kail, PA-C  pantoprazole (PROTONIX) 40 MG tablet Take 1 tablet (40 mg total) by mouth daily. 06/21/23  Yes , Elita Quick, MD  traMADol (ULTRAM) 50 MG tablet Take 1 tablet (50 mg total) by mouth every 6 (six) hours as needed for moderate pain. 05/29/23 05/28/24 Yes , Elita Quick, MD  triamterene-hydrochlorothiazide (MAXZIDE-25) 37.5-25 MG tablet Take 1 tablet by mouth daily. 05/20/23  Yes Reubin Milan, MD  zolpidem (AMBIEN) 10 MG tablet TAKE 1 TABLET BY MOUTH AT  BEDTIME 04/15/23  Yes Reubin Milan, MD    Allergies: Allergies  Allergen Reactions   Hydrocodone Itching    Review of Systems: Review of Systems  Constitutional:  Negative for chills and fever.  Respiratory:  Negative for shortness of breath.   Cardiovascular:  Negative for chest pain.  Gastrointestinal:  Negative for abdominal pain, nausea and vomiting.    Physical Exam BP 120/81   Pulse (!) 59   Temp 98.3 F (36.8 C)   Ht 5\' 6"  (1.676 m)   Wt 170 lb (77.1 kg)   SpO2 98%   BMI 27.44 kg/m  CONSTITUTIONAL: No acute distress, well-nourished HEENT:  Normocephalic, atraumatic, extraocular motion intact. RESPIRATORY:  Normal respiratory effort without pathologic use of accessory muscles. CARDIOVASCULAR: Regular rhythm and rate. GI: The abdomen is soft, nondistended, with mild soreness to palpation in the epigastric area.  No diffuse pain or peritonitis.  NEUROLOGIC:  Motor and sensation is grossly normal.  Cranial nerves are grossly intact. PSYCH:  Alert and oriented to person, place and time. Affect is normal.  Labs/Imaging: CT abdomen/pelvis on 05/30/2023: IMPRESSION: 1.  Irregular peripherally enhancing posterior collection extending from the second portion of the duodenum measuring 3.4 x 2.8 x 4.0 cm. This creates mass effect on the ventral aspect of the kidney with thickening of the posterior peritoneum. Findings are concerning for a contained perforation of a duodenal ulcer. No free air is present. 2. Descending and sigmoid diverticulosis without diverticulitis. 3. Harrington rods in place T9 through L1. 4.  Aortic Atherosclerosis (ICD10-I70.0).  Upper GI study on 06/03/2023: IMPRESSION: 1. Contrast opacifies a 3.3 cm diverticulum arising from the medial aspect of the second portion of the duodenum. This diverticulum was present on the prior CT examinations of 05/30/2023 and 11/21/2022. 2. No contrast is identified within the lesion along the posterior aspect of the second portion of the duodenum, which was identified on the prior CT abdomen/pelvis of 05/30/2023.  Assessment and Plan: This is a 67 y.o. female with a contained duodenal perforation.  - Discussed with  the patient that it is unclear why this perforation started.  On her CT scan, it was a contained perforation and thus did not require any surgery.  The location of this perforation also did not make it amenable for percutaneous drainage.  It is possible this could be related to a duodenal diverticulum although the fluid collection was on the opposite side of the duodenum.  There is also possibility this could have been a perforated ulcer.  The patient does not have a history of gastritis or heartburn.  She has been taking the antiacid prescribed on discharge twice daily and we will switch her to once daily after completing 1 month of twice daily therapy.  As a precaution, I think it would be prudent to refer her to gastroenterology to consider an upper endoscopy to further evaluate this area. - For now otherwise she can resume her more regular diet.  Return precautions given particular if she were  to have any worsening pain, nausea, vomiting, fevers, chills. - All of her questions have been answered.  I spent 30 minutes dedicated to the care of this patient on the date of this encounter to include pre-visit review of records, face-to-face time with the patient discussing diagnosis and management, and any post-visit coordination of care.   Howie Ill, MD [facility]

## 2023-06-25 NOTE — Progress Notes (Unsigned)
Celso Amy, PA-C [address]  [address]  [place], [place] [address]  Main: [phone]  Fax: [phone]   Gastroenterology Consultation  Referring Provider:     Reubin Milan, MD Primary Care Physician:  Reubin Milan, MD Primary Gastroenterologist:  Celso Amy, PA-C / Dr. [name]   Reason for Consultation:     F/U Duodenal Perforation, Duodenal Diverticulitis        HPI:   Tracy Chase is a 67 y.o. y/o female referred for consultation & management  by Reubin Milan, MD.    She was hospitalized 7/19 - 06/06/23 at [facility] for Duodenal Diverticulitis of small intestine with perforation and 3.4cm abscess.  Treated by Dr. Aleen Campi with conservative treatment.  She was NPO x3 days. She underwent UGI on 07/22 which did not show any continued evidence of duodenal perforation/leak. Diet was restarted and gradually advanced over the next 3 days. Treated with  PPI (Pantoprazole 40mg  daily) and antibiotics (Augmentin 14 days total).   Current Symptoms: Patient states she has had chronic intermittent right upper quadrant pain for many years.  It is worse after eating.  She was having severe stabbing back pain before her recent hospital admission.  That improved after antibiotic treatment.  She has had constipation for 1 year.  She states she never has a bowel movement.  Last bowel movement was 1 week ago.  Typically has bowel movement once per month.  She is currently on soft food diet.  Is taking Colace stool softener twice daily with little benefit.  Took MiraLAX years ago but not recently.  Has history of hemorrhoids.  No recent rectal bleeding.  Denies fever, chills, nausea, or vomiting.  Has history of acid reflux with belching.  Currently improved on Protonix 40 mg twice daily.  Labs 06/06/23: WBC 5.2, hemoglobin 11.9, hematocrit 36, MCV 92, BUN 5, creatinine 1.05, GFR 58.  Colonoscopy 08/2019 by Dr. Maximino Greenland showed good prep, 2 small (2mm - 3mm)  adenoma polyps removed  from the ascending and transverse colon, sigmoid diverticulosis.  No previous EGD.  Past Medical History:  Diagnosis Date   Anemia    h/o   Aortic atherosclerosis (HCC)    Arthritis    Cancer (HCC)    RENAL CELL   Depression    GERD (gastroesophageal reflux disease)    OCC NO MEDS   Hypertension    Incisional hernia    Insomnia    Rib lesion    3 x 2.1 x 2 cm expansile right fourth rib posteriorly   Sepsis [facility])     Past Surgical History:  Procedure Laterality Date   ABDOMINAL HYSTERECTOMY     APPENDECTOMY  2011   Lap converted to exlap appendectomy, drainage of abd abscesses   CESAREAN SECTION     COLONOSCOPY WITH PROPOFOL N/A 08/28/2019   Procedure: COLONOSCOPY WITH PROPOFOL;  Surgeon: Pasty Spillers, MD;  Location: [facility] END[facility]COPY;  Service: Endoscopy;  Laterality: N/A;   cryoablation for renal cell carcinoma  2012   ETHMOIDECTOMY Left 09/06/2020   Procedure: TOTAL ETHMOIDECTOMY;  Surgeon: Geanie Logan, MD;  Location: [facility];  Service: ENT;  Laterality: Left;   FRONTAL SINUS EXPLORATION Left 09/06/2020   Procedure: FRONTAL SINUS EXPLORATION;  Surgeon: Geanie Logan, MD;  Location: [facility];  Service: ENT;  Laterality: Left;   IMAGE GUIDED SINUS SURGERY N/A 09/06/2020   Procedure: IMAGE GUIDED SINUS SURGERY;  Surgeon: Geanie Logan, MD;  Location: [facility];  Service: ENT;  Laterality: N/A;  need stryker disk PLACE DISK ON OR CHARGE NURSE DESK 10-01 KP   INSERTION OF MESH  01/10/2023   Procedure: INSERTION OF MESH;  Surgeon: Henrene Dodge, MD;  Location: [facility] ORS;  Service: General;;   JOINT REPLACEMENT Left    partial knee replacement   LUMBAR FUSION  1994   MAXILLARY ANTROSTOMY Left 09/06/2020   Procedure: MAXILLARY ANTROSTOMY WITH TISSUE;  Surgeon: Geanie Logan, MD;  Location: [facility];  Service: ENT;  Laterality: Left;   MEDIAL PARTIAL KNEE REPLACEMENT Left    SPHENOIDECTOMY Left 09/06/2020   Procedure:  SPHENOIDECTOMY;  Surgeon: Geanie Logan, MD;  Location: [facility];  Service: ENT;  Laterality: Left;   TONSILLECTOMY     TOTAL KNEE REVISION Left 02/21/2021   Procedure: Revision, left partial to total knee;  Surgeon: Kennedy Bucker, MD;  Location: [facility] ORS;  Service: Orthopedics;  Laterality: Left;  CHRIS GAINES TO ASSIST   VENTRAL HERNIA REPAIR  2012   open ventral hernia repair with mesh   XI ROBOTIC ASSISTED VENTRAL HERNIA N/A 01/10/2023   Procedure: XI ROBOTIC ASSISTED VENTRAL HERNIA WITH LYSIS OF ADHESIONS;  Surgeon: Henrene Dodge, MD;  Location: [facility] ORS;  Service: General;  Laterality: N/A;    Prior to Admission medications   Medication Sig Start Date End Date Taking? Authorizing Provider  ALPRAZolam (XANAX) 0.25 MG tablet Take 1 tablet (0.25 mg total) by mouth 2 (two) times daily as needed for anxiety. 05/20/23   Reubin Milan, MD  docusate sodium (COLACE) 100 MG capsule Take 1 capsule (100 mg total) by mouth 2 (two) times daily. Patient taking differently: Take 100 mg by mouth daily as needed. 02/23/21   Evon Slack, PA-C  FLUoxetine (PROZAC) 20 MG capsule TAKE 1 CAPSULE BY MOUTH DAILY Patient taking differently: Take 20 mg by mouth every morning. 11/09/22   Reubin Milan, MD  pantoprazole (PROTONIX) 40 MG tablet Take 1 tablet (40 mg total) by mouth 2 (two) times daily. 06/06/23 07/06/23  Donovan Kail, PA-C  traMADol (ULTRAM) 50 MG tablet Take 1 tablet (50 mg total) by mouth every 6 (six) hours as needed for moderate pain. 05/29/23 05/28/24  Henrene Dodge, MD  triamterene-hydrochlorothiazide (MAXZIDE-25) 37.5-25 MG tablet Take 1 tablet by mouth daily. 05/20/23   Reubin Milan, MD  zolpidem (AMBIEN) 10 MG tablet TAKE 1 TABLET BY MOUTH AT  BEDTIME 04/15/23   Reubin Milan, MD    Family History  Problem Relation Age of Onset   Kidney failure Mother    Congestive Heart Failure Father    Breast cancer Neg Hx      Social History   Tobacco Use   Smoking  status: Former    Current packs/day: 0.00    Types: Cigarettes    Quit date: 1990    Years since quitting: 34.6    Passive exposure: Past   Smokeless tobacco: Never   Tobacco comments:    was "social Smoker" over 15 yrs ago  Vaping Use   Vaping status: Never Used  Substance Use Topics   Alcohol use: Yes    Comment: 2-3 times weekly   Drug use: No    Allergies as of 06/26/2023 - Review Complete 06/26/2023  Allergen Reaction Noted   Hydrocodone Itching 08/16/2015    Review of Systems:    All systems reviewed and negative except where noted in HPI.  Imaging Studies:  UGIS 06/03/23: IMPRESSION: 1. Contrast opacifies a 3.3 cm  diverticulum arising from the medial aspect of the second portion of the duodenum. This diverticulum was present on the prior CT examinations of 05/30/2023 and 11/21/2022. 2. No contrast is identified within the lesion along the posterior aspect of the second portion of the duodenum, which was identified on the prior CT abdomen/pelvis of 05/30/2023.  CT Abd / Pelvis w/ CM 05/31/23: IMPRESSION: 1. Irregular peripherally enhancing posterior collection extending from the second portion of the duodenum measuring 3.4 x 2.8 x 4.0 cm. This creates mass effect on the ventral aspect of the kidney with thickening of the posterior peritoneum. Findings are concerning for a contained perforation of a duodenal ulcer. No free air is present. 2. Descending and sigmoid diverticulosis without diverticulitis.  RUQ [place] 05/29/23: IMPRESSION: 1. Small mobile gallstones. No sonographic evidence of acute cholecystitis. 2. Adherent gallstones or polyps measuring up to 0.7 cm. Although almost certainly benign, given size recommend follow-up ultrasound in 12 months to ensure stability. 3. Echogenic lesion of the posterior right lobe of the liver measuring 2.0 cm, consistent with a benign hemangioma seen by prior CT examination. No further follow-up or characterization  is required.   Physical Exam:  BP 110/65   Pulse 65   Temp 98 F (36.7 C)   Ht 5\' 6"  (1.676 m)   Wt 174 lb 3.2 oz (79 kg)   BMI 28.12 kg/m  No LMP recorded. Patient has had a hysterectomy.  General:   Alert,  Well-developed, well-nourished, pleasant and cooperative in NAD; she is able to get on and off exam table with no help. Lungs:  Respirations even and unlabored.  Clear throughout to auscultation.   No wheezes, crackles, or rhonchi. No acute distress. Heart:  Regular rate and rhythm; no murmurs, clicks, rubs, or gallops. Abdomen:  Normal bowel sounds.  No bruits.  Soft, and non-distended without masses, hepatosplenomegaly or hernias noted.  There is moderate right upper quadrant and right mid tenderness.  She has no left-sided or lower abdominal tenderness.  Tenderness.  No guarding or rebound tenderness.    Neurologic:  Alert and oriented x3;  grossly normal neurologically. Psych:  Alert and cooperative. Normal mood and affect.   Assessment and Plan:   Tracy Chase is a 67 y.o. y/o female has been referred for hospital f/u of Duodenal Diverticulitis of small intestine with perforation and 3.4cm abscess.  Treated by Dr. Aleen Campi with conservative treatment (Pantoprazole 40mg  daily and Augmentin x 14 days).  Currently Feeling Better.  She continues to have chronic RUQ pain and chronic constipation.  Duodenal Diverticulitis of small intestine with perforation and 3.4cm abscess.  Continue to follow-up with general surgeon Dr. Aleen Campi  Schedule follow-up CT abdomen with Contrast  If CT shows persistent diverticulitis or abscess, then we will start more antibiotics.  She is not allergic to any antibiotics.  Plan to schedule EGD and colonoscopy 6 to 8 weeks after diverticulitis and abscess have resolved.  Cholelithiasis   Low-fat diet.    3.   RUQ Pain -chronic for many years  Uncertain if this is due to cholelithiasis, constipation, IBS, or recent duodenal diverticulitis.  She has  had multiple abdominal surgeries which could contribute to scar tissue.  Will continue to follow.  Scheduling repeat abdominal CT.  4.   Chronic constipation   Start Trulance 3 mg 1 tablet once daily, samples and Rx given.  If Trulance does not work, then we can try Amitiza or Linzess.  5.   GERD with Belching  Continue  pantoprazole 40 mg twice daily for now.  6.   Benign Liver Hemangioma  Reassurance.  No further imaging necessary.  7.   History of adenomatous colon polyps  Plans to repeat colonoscopy after duodenal diverticulitis and abscess have resolved.  Follow up in 4 weeks with TG.  Note I discussed this patient's case with Dr. Tobi Bastos to help decide patient's plan of care.  Celso Amy, PA-C

## 2023-06-26 ENCOUNTER — Ambulatory Visit: Payer: Medicare Other | Admitting: Physician Assistant

## 2023-06-26 ENCOUNTER — Encounter: Payer: Self-pay | Admitting: Physician Assistant

## 2023-06-26 VITALS — BP 110/65 | HR 65 | Temp 98.0°F | Ht 66.0 in | Wt 174.2 lb

## 2023-06-26 DIAGNOSIS — Z86010 Personal history of colonic polyps: Secondary | ICD-10-CM

## 2023-06-26 DIAGNOSIS — K5700 Diverticulitis of small intestine with perforation and abscess without bleeding: Secondary | ICD-10-CM | POA: Diagnosis not present

## 2023-06-26 DIAGNOSIS — K219 Gastro-esophageal reflux disease without esophagitis: Secondary | ICD-10-CM | POA: Diagnosis not present

## 2023-06-26 DIAGNOSIS — R142 Eructation: Secondary | ICD-10-CM | POA: Diagnosis not present

## 2023-06-26 DIAGNOSIS — K5901 Slow transit constipation: Secondary | ICD-10-CM

## 2023-06-26 DIAGNOSIS — K5909 Other constipation: Secondary | ICD-10-CM | POA: Diagnosis not present

## 2023-06-26 DIAGNOSIS — K8070 Calculus of gallbladder and bile duct without cholecystitis without obstruction: Secondary | ICD-10-CM | POA: Diagnosis not present

## 2023-06-26 DIAGNOSIS — R1011 Right upper quadrant pain: Secondary | ICD-10-CM | POA: Diagnosis not present

## 2023-06-26 DIAGNOSIS — D134 Benign neoplasm of liver: Secondary | ICD-10-CM | POA: Diagnosis not present

## 2023-06-26 DIAGNOSIS — K8020 Calculus of gallbladder without cholecystitis without obstruction: Secondary | ICD-10-CM

## 2023-06-26 MED ORDER — TRULANCE 3 MG PO TABS
1.0000 | ORAL_TABLET | Freq: Every day | ORAL | 2 refills | Status: DC
Start: 2023-06-26 — End: 2023-08-27

## 2023-06-26 NOTE — Patient Instructions (Addendum)
CT scheduled @ [facility] @ 4:15 pm 06/28/23. Clear liquids 4 hours prior.

## 2023-06-28 ENCOUNTER — Ambulatory Visit
Admission: RE | Admit: 2023-06-28 | Discharge: 2023-06-28 | Disposition: A | Discharge status: Home / Self Care | Payer: Medicare Other | Source: Ambulatory Visit | Attending: Physician Assistant | Admitting: Physician Assistant

## 2023-06-28 DIAGNOSIS — K269 Duodenal ulcer, unspecified as acute or chronic, without hemorrhage or perforation: Secondary | ICD-10-CM | POA: Diagnosis not present

## 2023-06-28 DIAGNOSIS — R1011 Right upper quadrant pain: Secondary | ICD-10-CM | POA: Insufficient documentation

## 2023-06-28 MED ORDER — IOHEXOL 300 MG/ML  SOLN: 100.0000 mL | Freq: Once | INTRAMUSCULAR | Status: DC | PRN

## 2023-06-28 MED ORDER — IOHEXOL 300 MG/ML  SOLN
75.0000 mL | Freq: Once | INTRAMUSCULAR | Status: AC | PRN
  Administered 2023-06-28: 75 mL via INTRAVENOUS

## 2023-07-01 ENCOUNTER — Telehealth: Payer: Self-pay

## 2023-07-01 ENCOUNTER — Other Ambulatory Visit: Payer: Self-pay

## 2023-07-01 DIAGNOSIS — K5700 Diverticulitis of small intestine with perforation and abscess without bleeding: Secondary | ICD-10-CM

## 2023-07-01 DIAGNOSIS — K5901 Slow transit constipation: Secondary | ICD-10-CM

## 2023-07-01 MED ORDER — PEG 3350-KCL-NA BICARB-NACL 420 G PO SOLR: 4000.0000 mL | Freq: Once | ORAL | 0 refills | Status: AC

## 2023-07-01 NOTE — Telephone Encounter (Signed)
EGD/Colonoscopy scheduled 08/23/23 with Dr.Anna-Golytely sent to pharmacy -follow up appointment scheduled. Prep instructions mailed to patient and was asked to call if any questions.

## 2023-07-01 NOTE — Progress Notes (Signed)
Call and notify patient CT scan shows previous duodenal diverticulitis infection has resolved.  No current abscess.  Go ahead and schedule EGD and colonoscopy in 4-6 weeks with DR. Anna.  Continue Trulance for Constipation.  Postpone f/u OV until 4 weeks after EGD / Colon Procedures.

## 2023-07-02 ENCOUNTER — Telehealth: Payer: Self-pay

## 2023-07-02 NOTE — Telephone Encounter (Signed)
Spoke with patient to let her know we need to move procedures to 08-21-23. Spoke with Rosann Auerbach and let her know patient is ok with moving to 08-21-23. New bowel prep instructions mailed today.

## 2023-07-03 ENCOUNTER — Ambulatory Visit: Payer: Self-pay | Admitting: *Deleted

## 2023-07-03 NOTE — Telephone Encounter (Signed)
  Chief Complaint: urge to urinate but can't go every time. Frequent urination at home AZO test positive . Hx left TKR Symptoms: recent discharge from hospital for tear in intestine. Urge to urinate but can not at times. Has urinated but continues to feel need to urinate. Does not report feeling full no abdominal pain . Mild pain if any. Possible low grade fever. Frequency: since discharge from hospital 06/06/23 Pertinent Negatives: Patient denies pain with urination no blood in urine  Disposition: [] ED /[] Urgent Care (no appt availability in office) / [] Appointment(In office/virtual)/ []  [facility]/ [] Home Care/ [x] Refused Recommended Disposition /[] [facility]/ []  Follow-up with PCP Additional Notes:   Recommended to go to ED for evaluation and patient declined . Reports "its not that bad yet". Appt already scheduled with provider tomorrow. Reports mild pain. Hx Left TKR. Encouraged to drink cranberry juice , dilute if needed. Dont drink more than she can urinate out. Please advise.    Reason for Disposition  Artificial heart valve or artificial joint  Answer Assessment - Initial Assessment Questions 1. SEVERITY: "How bad is the pain?"  (e.g., Scale 1-10; mild, moderate, or severe)   - MILD (1-3): complains slightly about urination hurting   - MODERATE (4-7): interferes with normal activities     - SEVERE (8-10): excruciating, unwilling or unable to urinate because of the pain      Mild  2. FREQUENCY: "How many times have you had painful urination today?"      Urge to urinate but can't at times.  3. PATTERN: "Is pain present every time you urinate or just sometimes?"      No pain  4. ONSET: "When did the painful urination start?"      Just released from hospital  5. FEVER: "Do you have a fever?" If Yes, ask: "What is your temperature, how was it measured, and when did it start?"     Possible low grade fever 6. PAST UTI: "Have you had a urine infection before?" If  Yes, ask: "When was the last time?" and "What happened that time?"      Na  7. CAUSE: "What do you think is causing the painful urination?"  (e.g., UTI, scratch, Herpes sore)     Na  8. OTHER SYMPTOMS: "Do you have any other symptoms?" (e.g., blood in urine, flank pain, genital sores, urgency, vaginal discharge)     Urge to urinate but can't at times. Reports she has urinated but feels like she needs to keep urinating. Hx left TKR 9. PREGNANCY: "Is there any chance you are pregnant?" "When was your last menstrual period?"     na  Protocols used: Urination Pain - Female-A-AH

## 2023-07-03 NOTE — Telephone Encounter (Signed)
Noted   Appt tomorrow  KP

## 2023-07-04 ENCOUNTER — Ambulatory Visit (INDEPENDENT_AMBULATORY_CARE_PROVIDER_SITE_OTHER): Payer: Medicare Other | Admitting: Family Medicine

## 2023-07-04 ENCOUNTER — Encounter: Payer: Self-pay | Admitting: Family Medicine

## 2023-07-04 VITALS — BP 122/68 | HR 58 | Ht 66.0 in | Wt 174.0 lb

## 2023-07-04 DIAGNOSIS — R3915 Urgency of urination: Secondary | ICD-10-CM | POA: Diagnosis not present

## 2023-07-04 DIAGNOSIS — B379 Candidiasis, unspecified: Secondary | ICD-10-CM

## 2023-07-04 LAB — POCT URINALYSIS DIPSTICK
Bilirubin, UA: NEGATIVE
Blood, UA: NEGATIVE
Glucose, UA: NEGATIVE
Ketones, UA: NEGATIVE
Leukocytes, UA: NEGATIVE
Nitrite, UA: NEGATIVE
Protein, UA: NEGATIVE
Spec Grav, UA: 1.01 (ref 1.010–1.025)
Urobilinogen, UA: 0.2 E.U./dL
pH, UA: 5 (ref 5.0–8.0)

## 2023-07-04 MED ORDER — FLUCONAZOLE 150 MG PO TABS
150.0000 mg | ORAL_TABLET | Freq: Once | ORAL | 0 refills | Status: AC
Start: 2023-07-04 — End: 2023-07-04

## 2023-07-04 NOTE — Progress Notes (Signed)
Date:  07/04/2023   Name:  Tracy Chase   DOB:  12/06/1955   MRN:  [id number]   Chief Complaint: Urinary Tract Infection (Past 2 mornings- felt urge to pee , but could only "dribble")  Urinary Frequency  This is a new problem. The current episode started in the past 7 days (2 days). The problem has been unchanged. The pain is at a severity of 0/10. The patient is experiencing no pain. Associated symptoms include frequency and urgency. Pertinent negatives include no chills, discharge, flank pain, hematuria or hesitancy. She has tried nothing for the symptoms. recent antibiotics    Lab Results  Component Value Date   NA 141 06/06/2023   K 4.2 06/06/2023   CO2 27 06/06/2023   GLUCOSE 94 06/06/2023   BUN <5 (L) 06/06/2023   CREATININE 1.05 (H) 06/06/2023   CALCIUM 8.6 (L) 06/06/2023   EGFR 79 11/06/2022   GFRNONAA 58 (L) 06/06/2023   Lab Results  Component Value Date   CHOL 219 (H) 11/06/2022   HDL 95 11/06/2022   LDLCALC 112 (H) 11/06/2022   TRIG 66 11/06/2022   CHOLHDL 2.3 11/06/2022   Lab Results  Component Value Date   TSH 1.620 11/06/2022   Lab Results  Component Value Date   HGBA1C 5.3 11/01/2021   Lab Results  Component Value Date   WBC 5.2 06/06/2023   HGB 11.9 (L) 06/06/2023   HCT 36.1 06/06/2023   MCV 92.1 06/06/2023   PLT 372 06/06/2023   Lab Results  Component Value Date   ALT 10 05/31/2023   AST 11 (L) 05/31/2023   ALKPHOS 70 05/31/2023   BILITOT 0.3 05/31/2023   No results found for: "25OHVITD2", "25OHVITD3", "VD25OH"   Review of Systems  Constitutional:  Negative for chills.  Respiratory:  Negative for shortness of breath.   Cardiovascular:  Negative for chest pain and palpitations.  Genitourinary:  Positive for frequency and urgency. Negative for dysuria, flank pain, hematuria, hesitancy, pelvic pain, vaginal bleeding and vaginal discharge.    Patient Active Problem List   Diagnosis Date Noted   Diverticulitis of small intestine with  perforation and abscess 05/31/2023   Recurrent incisional hernia 01/10/2023   Rib lesion 08/09/2022   Trigger index finger of right hand 11/01/2021   Atherosclerosis of aorta (HCC) 10/17/2021   S/P TKR (total knee replacement) using cement, left 02/21/2021   Mixed hyperlipidemia 09/25/2019   Encounter for screening colonoscopy    Polyp of colon    Degenerative disc disease, cervical 11/18/2017   Generalized anxiety disorder 06/21/2015   History of renal cell cancer 06/21/2015   Carpal tunnel syndrome 06/21/2015   Essential (primary) hypertension 06/21/2015   Idiopathic insomnia 06/21/2015    Allergies  Allergen Reactions   Hydrocodone Itching    Past Surgical History:  Procedure Laterality Date   ABDOMINAL HYSTERECTOMY     APPENDECTOMY  2011   Lap converted to exlap appendectomy, drainage of abd abscesses   CESAREAN SECTION     COLONOSCOPY WITH PROPOFOL N/A 08/28/2019   Procedure: COLONOSCOPY WITH PROPOFOL;  Surgeon: Pasty Spillers, MD;  Location: [facility] END[facility];  Service: Endoscopy;  Laterality: N/A;   cryoablation for renal cell carcinoma  2012   ETHMOIDECTOMY Left 09/06/2020   Procedure: TOTAL ETHMOIDECTOMY;  Surgeon: Geanie Logan, MD;  Location: [facility];  Service: ENT;  Laterality: Left;   FRONTAL SINUS EXPLORATION Left 09/06/2020   Procedure: FRONTAL SINUS EXPLORATION;  Surgeon: Geanie Logan, MD;  Location: [facility];  Service: ENT;  Laterality: Left;   IMAGE GUIDED SINUS SURGERY N/A 09/06/2020   Procedure: IMAGE GUIDED SINUS SURGERY;  Surgeon: Geanie Logan, MD;  Location: [facility];  Service: ENT;  Laterality: N/A;  need stryker disk PLACE DISK ON OR CHARGE NURSE DESK 10-01 KP   INSERTION OF MESH  01/10/2023   Procedure: INSERTION OF MESH;  Surgeon: Henrene Dodge, MD;  Location: [facility] ORS;  Service: General;;   JOINT REPLACEMENT Left    partial knee replacement   LUMBAR FUSION  1994   MAXILLARY ANTROSTOMY Left 09/06/2020    Procedure: MAXILLARY ANTROSTOMY WITH TISSUE;  Surgeon: Geanie Logan, MD;  Location: [facility];  Service: ENT;  Laterality: Left;   MEDIAL PARTIAL KNEE REPLACEMENT Left    SPHENOIDECTOMY Left 09/06/2020   Procedure: SPHENOIDECTOMY;  Surgeon: Geanie Logan, MD;  Location: [facility];  Service: ENT;  Laterality: Left;   TONSILLECTOMY     TOTAL KNEE REVISION Left 02/21/2021   Procedure: Revision, left partial to total knee;  Surgeon: Kennedy Bucker, MD;  Location: [facility] OR[facility];  Service: Orthopedics;  Laterality: Left;  CHRIS GAINES TO ASSIST   VENTRAL HERNIA REPAIR  2012   open ventral hernia repair with mesh   XI ROBOTIC ASSISTED VENTRAL HERNIA N/A 01/10/2023   Procedure: XI ROBOTIC ASSISTED VENTRAL HERNIA WITH LYSIS OF ADHESIONS;  Surgeon: Henrene Dodge, MD;  Location: [facility] OR[facility];  Service: General;  Laterality: N/A;    Social History   Tobacco Use   Smoking status: Former    Current packs/day: 0.00    Types: Cigarettes    Quit date: 1990    Years since quitting: 34.6    Passive exposure: Past   Smokeless tobacco: Never   Tobacco comments:    was "social Smoker" over 15 yrs ago  Vaping Use   Vaping status: Never Used  Substance Use Topics   Alcohol use: Yes    Comment: 2-3 times weekly   Drug use: No     Medication list has been reviewed and updated.  Current Meds  Medication Sig   ALPRAZolam (XANAX) 0.25 MG tablet Take 1 tablet (0.25 mg total) by mouth 2 (two) times daily as needed for anxiety.   FLUoxetine (PROZAC) 20 MG capsule TAKE 1 CAPSULE BY MOUTH DAILY (Patient taking differently: Take 20 mg by mouth every morning.)   pantoprazole (PROTONIX) 40 MG tablet Take 1 tablet (40 mg total) by mouth 2 (two) times daily.   pantoprazole (PROTONIX) 40 MG tablet Take 1 tablet (40 mg total) by mouth daily.   Plecanatide (TRULANCE) 3 MG TABS Take 1 tablet (3 mg total) by mouth daily.   traMADol (ULTRAM) 50 MG tablet Take 1 tablet (50 mg total) by mouth every 6 (six)  hours as needed for moderate pain.   triamterene-hydrochlorothiazide (MAXZIDE-25) 37.5-25 MG tablet Take 1 tablet by mouth daily.   zolpidem (AMBIEN) 10 MG tablet TAKE 1 TABLET BY MOUTH AT  BEDTIME   [DISCONTINUED] docusate sodium (COLACE) 100 MG capsule Take 1 capsule (100 mg total) by mouth 2 (two) times daily.       07/04/2023   11:32 AM 05/20/2023    1:24 PM 12/25/2022    8:40 AM 11/06/2022   10:05 AM  GAD 7 : Generalized Anxiety Score  Nervous, Anxious, on Edge 0 3 2 1   Control/stop worrying 0 3 2 3   Worry too much - different things 0 3 2 2   Trouble relaxing 0 0 2 1  Restless 0 0 2 1  Easily annoyed or irritable 1 2 2  0  Afraid - awful might happen 1 0 2 2  Total GAD 7 Score 2 11 14 10   Anxiety Difficulty Not difficult at all Not difficult at all Not difficult at all Not difficult at all       07/04/2023   11:32 AM 05/20/2023    1:24 PM 12/25/2022    8:40 AM  Depression screen PHQ 2/9  Decreased Interest 0 0 0  Down, Depressed, Hopeless 0 0 0  PHQ - 2 Score 0 0 0  Altered sleeping 0 0 1  Tired, decreased energy 0 0 0  Change in appetite 0 0 0  Feeling bad or failure about yourself  0 0 0  Trouble concentrating 0 0 0  Moving slowly or fidgety/restless 0 0 0  Suicidal thoughts 0 0 0  PHQ-9 Score 0 0 1  Difficult doing work/chores Not difficult at all Not difficult at all Not difficult at all    BP Readings from Last 3 Encounters:  07/04/23 122/68  06/26/23 110/65  06/21/23 120/81    Physical Exam Vitals and nursing note reviewed.  HENT:     Head: Normocephalic.     Right Ear: Tympanic membrane normal.     Left Ear: Tympanic membrane normal.     Mouth/Throat:     Mouth: Mucous membranes are moist.     Pharynx: Oropharynx is clear.  Cardiovascular:     Rate and Rhythm: Normal rate and regular rhythm.     Heart sounds: No murmur heard.    No friction rub. No gallop.  Pulmonary:     Effort: No respiratory distress.     Breath sounds: No wheezing, rhonchi or  rales.  Abdominal:     Tenderness: There is abdominal tenderness in the right upper quadrant. There is no guarding.     Comments: No suprapubic tenderness     Wt Readings from Last 3 Encounters:  07/04/23 174 lb (78.9 kg)  06/26/23 174 lb 3.2 oz (79 kg)  06/21/23 170 lb (77.1 kg)    BP 122/68   Pulse (!) 58   Ht 5\' 6"  (1.676 m)   Wt 174 lb (78.9 kg)   SpO2 99%   BMI 28.08 kg/m   Assessment and Plan: 1. Urgency of urination New onset.  With frequency 48 hours minimal urine each time without nocturia this may be of anxiety or it could be that since she has recently had antibiotics for diverticular disease of the small intestine that there may have been a selection of a periurethral candidiasis. - POCT urinalysis dipstick  2. Candidiasis Given the recent antibiotics that patient has had we will treat with Diflucan 150 mg once a day and if symptoms continue patient is to return and we may need to have urology evaluate for residual urine. - fluconazole (DIFLUCAN) 150 MG tablet; Take 1 tablet (150 mg total) by mouth once for 1 dose.  Dispense: 1 tablet; Refill: 0     Elizabeth Sauer, MD

## 2023-07-11 ENCOUNTER — Other Ambulatory Visit: Payer: Self-pay | Admitting: Internal Medicine

## 2023-07-11 DIAGNOSIS — F5101 Primary insomnia: Secondary | ICD-10-CM

## 2023-07-11 NOTE — Telephone Encounter (Signed)
Please review.  KP

## 2023-07-12 MED ORDER — ZOLPIDEM TARTRATE 10 MG PO TABS
10.0000 mg | ORAL_TABLET | Freq: Every day | ORAL | 1 refills | Status: DC
Start: 2023-07-12 — End: 2023-11-14

## 2023-07-17 ENCOUNTER — Telehealth: Payer: Self-pay | Admitting: Physician Assistant

## 2023-07-17 NOTE — Telephone Encounter (Signed)
Patient called in because about an appointment for 07/29/23. I did inform her that it was an error appointment and I cancel the appointment.

## 2023-07-29 ENCOUNTER — Ambulatory Visit: Payer: Medicare Other | Admitting: Physician Assistant

## 2023-07-30 ENCOUNTER — Ambulatory Visit (INDEPENDENT_AMBULATORY_CARE_PROVIDER_SITE_OTHER): Payer: Medicare Other

## 2023-07-30 DIAGNOSIS — Z0000 Encounter for general adult medical examination without abnormal findings: Secondary | ICD-10-CM

## 2023-07-30 NOTE — Progress Notes (Signed)
Subjective:   Tracy Chase is a 67 y.o. female who presents for Medicare Annual (Subsequent) preventive examination.  Visit Complete: Virtual  I connected with  Tracy Chase on 07/30/23 by a audio enabled telemedicine application and verified that I am speaking with the correct person using two identifiers.  Patient Location: Home  Provider Location: Office/Clinic  I discussed the limitations of evaluation and management by telemedicine. The patient expressed understanding and agreed to proceed.  Vital Signs: Unable to obtain new vitals due to this being a telehealth visit. Cardiac Risk Factors include: advanced age (>44men, >107 women);hypertension;dyslipidemia     Objective:    There were no vitals filed for this visit. There is no height or weight on file to calculate BMI.     07/30/2023   10:14 AM 06/01/2023    2:01 PM 05/31/2023    1:18 PM 12/31/2022   10:35 AM 04/19/2021    9:29 AM 02/21/2021    1:05 PM 02/13/2021   10:21 AM  Advanced Directives  Does Patient Have a Medical Advance Directive? No  No No No No No  Does patient want to make changes to medical advance directive?      No - Patient declined   Would patient like information on creating a medical advance directive? No - Patient declined No - Patient declined   Yes (MAU/Ambulatory/Procedural Areas - Information given)      Current Medications (verified) Outpatient Encounter Medications as of 07/30/2023  Medication Sig   ALPRAZolam (XANAX) 0.25 MG tablet Take 1 tablet (0.25 mg total) by mouth 2 (two) times daily as needed for anxiety.   FLUoxetine (PROZAC) 20 MG capsule TAKE 1 CAPSULE BY MOUTH DAILY (Patient taking differently: Take 20 mg by mouth every morning.)   pantoprazole (PROTONIX) 40 MG tablet Take 1 tablet (40 mg total) by mouth daily.   Plecanatide (TRULANCE) 3 MG TABS Take 1 tablet (3 mg total) by mouth daily.   traMADol (ULTRAM) 50 MG tablet Take 1 tablet (50 mg total) by mouth every 6 (six) hours as  needed for moderate pain.   triamterene-hydrochlorothiazide (MAXZIDE-25) 37.5-25 MG tablet Take 1 tablet by mouth daily.   zolpidem (AMBIEN) 10 MG tablet Take 1 tablet (10 mg total) by mouth at bedtime.   pantoprazole (PROTONIX) 40 MG tablet Take 1 tablet (40 mg total) by mouth 2 (two) times daily.   No facility-administered encounter medications on file as of 07/30/2023.    Allergies (verified) Hydrocodone   History: Past Medical History:  Diagnosis Date   Anemia    h/o   Aortic atherosclerosis (HCC)    Arthritis    Cancer (HCC)    RENAL CELL   Depression    GERD (gastroesophageal reflux disease)    OCC NO MEDS   Hypertension    Incisional hernia    Insomnia    Rib lesion    3 x 2.1 x 2 cm expansile right fourth rib posteriorly   Sepsis [facility])    Past Surgical History:  Procedure Laterality Date   ABDOMINAL HYSTERECTOMY     APPENDECTOMY  2011   Lap converted to exlap appendectomy, drainage of abd abscesses   CESAREAN SECTION     COLONOSCOPY WITH PROPOFOL N/A 08/28/2019   Procedure: COLONOSCOPY WITH PROPOFOL;  Surgeon: Pasty Spillers, MD;  Location: [facility] ENDOSCOPY;  Service: Endoscopy;  Laterality: N/A;   cryoablation for renal cell carcinoma  2012   ETHMOIDECTOMY Left 09/06/2020   Procedure: TOTAL ETHMOIDECTOMY;  Surgeon:  Geanie Logan, MD;  Location: [facility] SURGERY [facility]TR;  Service: ENT;  Laterality: Left;   FRONTAL SINUS EXPLORATION Left 09/06/2020   Procedure: FRONTAL SINUS EXPLORATION;  Surgeon: Geanie Logan, MD;  Location: [facility];  Service: ENT;  Laterality: Left;   IMAGE GUIDED SINUS SURGERY N/A 09/06/2020   Procedure: IMAGE GUIDED SINUS SURGERY;  Surgeon: Geanie Logan, MD;  Location: [facility] SURGERY CNTR;  Service: ENT;  Laterality: N/A;  need stryker disk PLACE DISK ON OR CHARGE NURSE DESK 10-01 KP   INSERTION OF MESH  01/10/2023   Procedure: INSERTION OF MESH;  Surgeon: Henrene Dodge, MD;  Location: [facility] ORS;  Service: General;;   JOINT  REPLACEMENT Left    partial knee replacement   LUMBAR FUSION  1994   MAXILLARY ANTROSTOMY Left 09/06/2020   Procedure: MAXILLARY ANTROSTOMY WITH TISSUE;  Surgeon: Geanie Logan, MD;  Location: [facility] SURGERY [facility];  Service: ENT;  Laterality: Left;   MEDIAL PARTIAL KNEE REPLACEMENT Left    SPHENOIDECTOMY Left 09/06/2020   Procedure: SPHENOIDECTOMY;  Surgeon: Geanie Logan, MD;  Location: [facility] SURGERY [facility];  Service: ENT;  Laterality: Left;   TONSILLECTOMY     TOTAL KNEE REVISION Left 02/21/2021   Procedure: Revision, left partial to total knee;  Surgeon: Kennedy Bucker, MD;  Location: [facility] ORS;  Service: Orthopedics;  Laterality: Left;  CHRIS GAINES TO ASSIST   VENTRAL HERNIA REPAIR  2012   open ventral hernia repair with mesh   XI ROBOTIC ASSISTED VENTRAL HERNIA N/A 01/10/2023   Procedure: XI ROBOTIC ASSISTED VENTRAL HERNIA WITH LYSIS OF ADHESIONS;  Surgeon: Henrene Dodge, MD;  Location: [facility] ORS;  Service: General;  Laterality: N/A;   Family History  Problem Relation Age of Onset   Kidney failure Mother    Congestive Heart Failure Father    Breast cancer Neg Hx    Social History   Socioeconomic History   Marital status: Significant Other    Spouse name: Not on file   Number of children: 1   Years of education: Not on file   Highest education level: Not on file  Occupational History   Occupation: Retired  Tobacco Use   Smoking status: Former    Current packs/day: 0.00    Types: Cigarettes    Quit date: 1990    Years since quitting: 34.7    Passive exposure: Past   Smokeless tobacco: Never   Tobacco comments:    was "social Smoker" over 15 yrs ago  Vaping Use   Vaping status: Never Used  Substance and Sexual Activity   Alcohol use: Yes    Comment: 2-3 times weekly   Drug use: No   Sexual activity: Not on file  Other Topics Concern   Not on file  Social History Narrative   Pt has 1 son and 2 step daughters. Lives with significant other   Social Determinants of  Health   Financial Resource Strain: Low Risk  (07/30/2023)   Overall Financial Resource Strain (CARDIA)    Difficulty of Paying Living Expenses: Not hard at all  Food Insecurity: No Food Insecurity (07/30/2023)   Hunger Vital Sign    Worried About Running Out of Food in the Last Year: Never true    Ran Out of Food in the Last Year: Never true  Transportation Needs: No Transportation Needs (07/30/2023)   PRAPARE - Administrator, Civil Service (Medical): No    Lack of Transportation (Non-Medical): No  Physical Activity: Sufficiently Active (07/30/2023)   Exercise Vital  Sign    Days of Exercise per Week: 4 days    Minutes of Exercise per Session: 40 min  Stress: No Stress Concern Present (07/30/2023)   [employer] of Occupational Health - Occupational Stress Questionnaire    Feeling of Stress : Not at all  Social Connections: Moderately Isolated (07/30/2023)   Social Connection and Isolation Panel [NHANES]    Frequency of Communication with Friends and Family: More than three times a week    Frequency of Social Gatherings with Friends and Family: Twice a week    Attends Religious Services: Never    Database administrator or Organizations: No    Attends Engineer, structural: Never    Marital Status: Living with partner    Tobacco Counseling Counseling given: Not Answered Tobacco comments: was "social Smoker" over 15 yrs ago   Clinical Intake:  Pre-visit preparation completed: Yes  Pain : No/denies pain     Nutritional Status: BMI 25 -29 Overweight Nutritional Risks: None Diabetes: No  How often do you need to have someone help you when you read instructions, pamphlets, or other written materials from your doctor or pharmacy?: 1 - Never  Interpreter Needed?: No  Information entered by :: Kennedy Bucker, LPN   Activities of Daily Living    07/30/2023   10:14 AM 05/31/2023    4:52 PM  In your present state of health, do you have any difficulty  performing the following activities:  Hearing? 0   Vision? 0   Difficulty concentrating or making decisions? 0   Walking or climbing stairs? 0   Dressing or bathing? 0   Doing errands, shopping? 0 0  Preparing Food and eating ? N   Using the Toilet? N   In the past six months, have you accidently leaked urine? N   Do you have problems with loss of bowel control? N   Managing your Medications? N   Managing your Finances? N   Housekeeping or managing your Housekeeping? N     Patient Care Team: Reubin Milan, MD as PCP - General (Internal Medicine) Debbe Odea, MD as PCP - Cardiology (Cardiology) Kennedy Bucker, MD as Consulting Physician (Orthopedic Surgery) Merri Ray, MD as Referring Physician (Physical Medicine and Rehabilitation) Jim Like, RN as Registered Nurse Scarlett Presto, RN (Inactive) as Registered Nurse Jesusita Oka, MD (Dermatology)  Indicate any recent Medical Services you may have received from other than [facility] providers in the past year (date may be approximate).     Assessment:   This is a routine wellness examination for Kirkpatrick.  Hearing/Vision screen Hearing Screening - Comments:: No aids Vision Screening - Comments:: Readers[facility]   Goals Addressed             This Visit's Progress    DIET - EAT MORE FRUITS AND VEGETABLES         Depression Screen    07/30/2023   10:12 AM 07/04/2023   11:32 AM 05/20/2023    1:24 PM 12/25/2022    8:40 AM 11/06/2022   10:05 AM 07/27/2022    3:37 PM 07/13/2022    8:53 AM  PHQ 2/9 Scores  PHQ - 2 Score 0 0 0 0 1 0 0  PHQ- 9 Score 0 0 0 1 1 4      Fall Risk    07/30/2023   10:14 AM 07/04/2023   11:32 AM 05/20/2023    1:24 PM 12/25/2022    8:40 AM  11/06/2022   10:05 AM  Fall Risk   Falls in the past year? 0 0 0 0 0  Number falls in past yr: 0 0 0 0 0  Injury with Fall? 0 0 0 0 0  Risk for fall due to : No Fall Risks No Fall Risks No Fall Risks No Fall Risks No  Fall Risks  Follow up Falls prevention discussed;Falls evaluation completed Falls evaluation completed Falls evaluation completed Falls evaluation completed Falls evaluation completed    MEDICARE RISK AT HOME: Medicare Risk at Home Any stairs in or around the home?: Yes If so, are there any without handrails?: No Home free of loose throw rugs in walkways, pet beds, electrical cords, etc?: Yes Adequate lighting in your home to reduce risk of falls?: Yes Life alert?: No Use of a cane, walker or w/c?: No Grab bars in the bathroom?: No Shower chair or bench in shower?: No Elevated toilet seat or a handicapped toilet?: No  TIMED UP AND GO:  Was the test performed?  No    Cognitive Function:        07/30/2023   10:15 AM 07/13/2022    8:55 AM 04/18/2020    9:41 AM  6CIT Screen  What Year? 0 points 0 points 0 points  What month? 0 points 0 points 0 points  What time? 0 points 0 points 0 points  Count back from 20 0 points 0 points 0 points  Months in reverse 0 points 0 points 0 points  Repeat phrase 0 points 0 points 0 points  Total Score 0 points 0 points 0 points    Immunizations Immunization History  Administered Date(s) Administered   Fluad Quad(high Dose 65+) 09/02/2019, 07/27/2022   Influenza Inj Mdck Quad Pf 08/18/2018   Influenza,inj,Quad PF,6+ Mos 10/02/2016, 09/03/2017   Influenza-Unspecified 08/26/2020, 08/12/2021, 07/20/2023   PFIZER(Purple Top)SARS-COV-2 Vaccination 02/08/2020, 03/01/2020, 09/30/2020   Pfizer Covid-19 Vaccine Bivalent Booster 69yrs & up 05/16/2021   Pneumococcal Conjugate-13 09/23/2019   Pneumococcal Polysaccharide-23 10/12/2020   Tdap 04/29/2012   Zoster Recombinant(Shingrix) 08/13/2021, 11/20/2021    TDAP status: Due, Education has been provided regarding the importance of this vaccine. Advised may receive this vaccine at local pharmacy or Health Dept. Aware to provide a copy of the vaccination record if obtained from local pharmacy or Health  Dept. Verbalized acceptance and understanding.  Flu Vaccine status: Up to date  Pneumococcal vaccine status: Up to date  Covid-19 vaccine status: Completed vaccines  Qualifies for Shingles Vaccine? Yes   Zostavax completed No   Shingrix Completed?: Yes  Screening Tests Health Maintenance  Topic Date Due   DTaP/Tdap/Td (2 - Td or Tdap) 04/29/2022   COVID-19 Vaccine (5 - 2023-24 season) 07/14/2023   MAMMOGRAM  12/18/2023   Medicare Annual Wellness (AWV)  07/29/2024   Colonoscopy  08/27/2024   Pneumonia Vaccine 57+ Years old  Completed   INFLUENZA VACCINE  Completed   DEXA SCAN  Completed   Hepatitis C Screening  Completed   Zoster Vaccines- Shingrix  Completed   HPV VACCINES  Aged Out    Health Maintenance  Health Maintenance Due  Topic Date Due   DTaP/Tdap/Td (2 - Td or Tdap) 04/29/2022   COVID-19 Vaccine (5 - 2023-24 season) 07/14/2023    Colorectal cancer screening: Type of screening: Colonoscopy. Completed 08/28/19. Repeat every 5 years- has one scheduled for 08/21/23  Mammogram status: Completed 12/17/22. Repeat every year  Bone Density status: Completed 10/07/19. Results reflect: Bone density results: NORMAL. Repeat  every 5 years.  Lung Cancer Screening: (Low Dose CT Chest recommended if Age 15-80 years, 20 pack-year currently smoking OR have quit w/in 15years.) does not qualify.    Additional Screening:  Hepatitis C Screening: does qualify; Completed 11/01/21  Vision Screening: Recommended annual ophthalmology exams for early detection of glaucoma and other disorders of the eye. Is the patient up to date with their annual eye exam?  Yes  Who is the provider or what is the name of the office in which the patient attends annual eye exams? Dr.Sigmund If pt is not established with a provider, would they like to be referred to a provider to establish care? No .   Dental Screening: Recommended annual dental exams for proper oral hygiene    Community Resource  Referral / Chronic Care Management: CRR required this visit?  No   CCM required this visit?  No     Plan:     I have personally reviewed and noted the following in the patient's chart:   Medical and social history Use of alcohol, tobacco or illicit drugs  Current medications and supplements including opioid prescriptions. Patient is not currently taking opioid prescriptions. Functional ability and status Nutritional status Physical activity Advanced directives List of other physicians Hospitalizations, surgeries, and ER visits in previous 12 months Vitals Screenings to include cognitive, depression, and falls Referrals and appointments  In addition, I have reviewed and discussed with patient certain preventive protocols, quality metrics, and best practice recommendations. A written personalized care plan for preventive services as well as general preventive health recommendations were provided to patient.     Hal Hope, LPN   1/61/0960   After Visit Summary: (MyChart) Due to this being a telephonic visit, the after visit summary with patients personalized plan was offered to patient via MyChart   Nurse Notes: none

## 2023-07-30 NOTE — Patient Instructions (Addendum)
Tracy Chase , Thank you for taking time to come for your Medicare Wellness Visit. I appreciate your ongoing commitment to your health goals. Please review the following plan we discussed and let me know if I can assist you in the future.   Referrals/Orders/Follow-Ups/Clinician Recommendations: none  This is a list of the screening recommended for you and due dates:  Health Maintenance  Topic Date Due   DTaP/Tdap/Td vaccine (2 - Td or Tdap) 04/29/2022   COVID-19 Vaccine (5 - 2023-24 season) 07/14/2023   Mammogram  12/18/2023   Medicare Annual Wellness Visit  07/29/2024   Colon Cancer Screening  08/27/2024   Pneumonia Vaccine  Completed   Flu Shot  Completed   DEXA scan (bone density measurement)  Completed   Hepatitis C Screening  Completed   Zoster (Shingles) Vaccine  Completed   HPV Vaccine  Aged Out    Advanced directives: (ACP Link)Information on Advanced Care Planning can be found at [facility] Advance Health Care Directives Advance Health Care Directives ([email or web address]   Next Medicare Annual Wellness Visit scheduled for next year: Yes   08/04/24 @ 9:00 am by video

## 2023-08-06 DIAGNOSIS — L570 Actinic keratosis: Secondary | ICD-10-CM | POA: Diagnosis not present

## 2023-08-06 DIAGNOSIS — L578 Other skin changes due to chronic exposure to nonionizing radiation: Secondary | ICD-10-CM | POA: Diagnosis not present

## 2023-08-06 DIAGNOSIS — D485 Neoplasm of uncertain behavior of skin: Secondary | ICD-10-CM | POA: Diagnosis not present

## 2023-08-06 DIAGNOSIS — Z85828 Personal history of other malignant neoplasm of skin: Secondary | ICD-10-CM | POA: Diagnosis not present

## 2023-08-06 DIAGNOSIS — D492 Neoplasm of unspecified behavior of bone, soft tissue, and skin: Secondary | ICD-10-CM | POA: Diagnosis not present

## 2023-08-06 DIAGNOSIS — L814 Other melanin hyperpigmentation: Secondary | ICD-10-CM | POA: Diagnosis not present

## 2023-08-06 DIAGNOSIS — L821 Other seborrheic keratosis: Secondary | ICD-10-CM | POA: Diagnosis not present

## 2023-08-15 ENCOUNTER — Other Ambulatory Visit: Payer: Self-pay | Admitting: Internal Medicine

## 2023-08-15 DIAGNOSIS — F411 Generalized anxiety disorder: Secondary | ICD-10-CM

## 2023-08-15 MED ORDER — ALPRAZOLAM 0.25 MG PO TABS
0.2500 mg | ORAL_TABLET | Freq: Two times a day (BID) | ORAL | 0 refills | Status: DC | PRN
Start: 2023-08-15 — End: 2023-10-31

## 2023-08-15 NOTE — Telephone Encounter (Signed)
Please review.  KP

## 2023-08-21 ENCOUNTER — Ambulatory Visit: Payer: Medicare Other

## 2023-08-21 ENCOUNTER — Encounter: Payer: Self-pay | Admitting: Gastroenterology

## 2023-08-21 ENCOUNTER — Observation Stay
Admission: RE | Admit: 2023-08-21 | Discharge: 2023-08-22 | Disposition: A | Discharge status: Home / Self Care | Payer: Medicare Other | Source: Ambulatory Visit | Attending: Surgery | Admitting: Surgery

## 2023-08-21 ENCOUNTER — Ambulatory Visit: Payer: Medicare Other | Admitting: Anesthesiology

## 2023-08-21 ENCOUNTER — Other Ambulatory Visit: Payer: Self-pay

## 2023-08-21 ENCOUNTER — Encounter
Admission: RE | Disposition: A | Discharge status: Home / Self Care | Payer: Self-pay | Source: Ambulatory Visit | Attending: Surgery

## 2023-08-21 DIAGNOSIS — Z85528 Personal history of other malignant neoplasm of kidney: Secondary | ICD-10-CM | POA: Insufficient documentation

## 2023-08-21 DIAGNOSIS — R109 Unspecified abdominal pain: Secondary | ICD-10-CM | POA: Diagnosis not present

## 2023-08-21 DIAGNOSIS — N281 Cyst of kidney, acquired: Secondary | ICD-10-CM | POA: Diagnosis not present

## 2023-08-21 DIAGNOSIS — Z96652 Presence of left artificial knee joint: Secondary | ICD-10-CM | POA: Insufficient documentation

## 2023-08-21 DIAGNOSIS — Z1211 Encounter for screening for malignant neoplasm of colon: Secondary | ICD-10-CM | POA: Diagnosis not present

## 2023-08-21 DIAGNOSIS — Z09 Encounter for follow-up examination after completed treatment for conditions other than malignant neoplasm: Secondary | ICD-10-CM | POA: Diagnosis not present

## 2023-08-21 DIAGNOSIS — K5730 Diverticulosis of large intestine without perforation or abscess without bleeding: Secondary | ICD-10-CM | POA: Diagnosis not present

## 2023-08-21 DIAGNOSIS — Z87891 Personal history of nicotine dependence: Secondary | ICD-10-CM | POA: Diagnosis not present

## 2023-08-21 DIAGNOSIS — K5901 Slow transit constipation: Secondary | ICD-10-CM

## 2023-08-21 DIAGNOSIS — K661 Hemoperitoneum: Secondary | ICD-10-CM | POA: Diagnosis not present

## 2023-08-21 DIAGNOSIS — K5750 Diverticulosis of both small and large intestine without perforation or abscess without bleeding: Secondary | ICD-10-CM | POA: Diagnosis not present

## 2023-08-21 DIAGNOSIS — Z79899 Other long term (current) drug therapy: Secondary | ICD-10-CM | POA: Insufficient documentation

## 2023-08-21 DIAGNOSIS — R9431 Abnormal electrocardiogram [ECG] [EKG]: Secondary | ICD-10-CM

## 2023-08-21 DIAGNOSIS — K261 Acute duodenal ulcer with perforation: Secondary | ICD-10-CM | POA: Insufficient documentation

## 2023-08-21 DIAGNOSIS — I10 Essential (primary) hypertension: Secondary | ICD-10-CM | POA: Diagnosis not present

## 2023-08-21 DIAGNOSIS — K5710 Diverticulosis of small intestine without perforation or abscess without bleeding: Secondary | ICD-10-CM | POA: Diagnosis not present

## 2023-08-21 DIAGNOSIS — K5700 Diverticulitis of small intestine with perforation and abscess without bleeding: Secondary | ICD-10-CM

## 2023-08-21 DIAGNOSIS — K6389 Other specified diseases of intestine: Secondary | ICD-10-CM | POA: Diagnosis not present

## 2023-08-21 DIAGNOSIS — K5790 Diverticulosis of intestine, part unspecified, without perforation or abscess without bleeding: Secondary | ICD-10-CM | POA: Diagnosis not present

## 2023-08-21 DIAGNOSIS — R079 Chest pain, unspecified: Secondary | ICD-10-CM | POA: Diagnosis not present

## 2023-08-21 DIAGNOSIS — K449 Diaphragmatic hernia without obstruction or gangrene: Secondary | ICD-10-CM | POA: Insufficient documentation

## 2023-08-21 DIAGNOSIS — J9811 Atelectasis: Secondary | ICD-10-CM | POA: Diagnosis not present

## 2023-08-21 DIAGNOSIS — M25512 Pain in left shoulder: Secondary | ICD-10-CM | POA: Diagnosis not present

## 2023-08-21 HISTORY — PX: COLONOSCOPY WITH PROPOFOL: SHX5780

## 2023-08-21 HISTORY — PX: ESOPHAGOGASTRODUODENOSCOPY (EGD) WITH PROPOFOL: SHX5813

## 2023-08-21 LAB — HEMOGLOBIN AND HEMATOCRIT, BLOOD
HCT: 33.6 % — ABNORMAL LOW (ref 36.0–46.0)
Hemoglobin: 11.1 g/dL — ABNORMAL LOW (ref 12.0–15.0)

## 2023-08-21 LAB — COMPREHENSIVE METABOLIC PANEL
ALT: 11 U/L (ref 0–44)
AST: 16 U/L (ref 15–41)
Albumin: 3.4 g/dL — ABNORMAL LOW (ref 3.5–5.0)
Alkaline Phosphatase: 44 U/L (ref 38–126)
Anion gap: 7 (ref 5–15)
BUN: <5 mg/dL — ABNORMAL LOW (ref 8–23)
CO2: 24 mmol/L (ref 22–32)
Calcium: 8.3 mg/dL — ABNORMAL LOW (ref 8.9–10.3)
Chloride: 105 mmol/L (ref 98–111)
Creatinine, Ser: 0.71 mg/dL (ref 0.44–1.00)
GFR, Estimated: >60 mL/min (ref >60)
Glucose, Bld: 130 mg/dL — ABNORMAL HIGH (ref 70–99)
Potassium: 3.2 mmol/L — ABNORMAL LOW (ref 3.5–5.1)
Sodium: 136 mmol/L (ref 135–145)
Total Bilirubin: 0.7 mg/dL (ref 0.3–1.2)
Total Protein: 5.7 g/dL — ABNORMAL LOW (ref 6.5–8.1)

## 2023-08-21 LAB — CBC
HCT: 32.9 % — ABNORMAL LOW (ref 36.0–46.0)
Hemoglobin: 11 g/dL — ABNORMAL LOW (ref 12.0–15.0)
MCH: 30.3 pg (ref 26.0–34.0)
MCHC: 33.4 g/dL (ref 30.0–36.0)
MCV: 90.6 fL (ref 80.0–100.0)
Platelets: 153 10*3/uL (ref 150–400)
RBC: 3.63 MIL/uL — ABNORMAL LOW (ref 3.87–5.11)
RDW: 13 % (ref 11.5–15.5)
WBC: 2.8 10*3/uL — ABNORMAL LOW (ref 4.0–10.5)
nRBC: 0 % (ref 0.0–0.2)

## 2023-08-21 LAB — TROPONIN I (HIGH SENSITIVITY)
Troponin I (High Sensitivity): 3 ng/L (ref <18)
Troponin I (High Sensitivity): 3 ng/L (ref <18)

## 2023-08-21 SURGERY — COLONOSCOPY WITH PROPOFOL
Anesthesia: General

## 2023-08-21 MED ORDER — IOHEXOL 300 MG/ML  SOLN
100.0000 mL | Freq: Once | INTRAMUSCULAR | Status: AC | PRN
  Administered 2023-08-21: 100 mL via INTRAVENOUS

## 2023-08-21 MED ORDER — SODIUM CHLORIDE 0.9 % IV SOLN
INTRAVENOUS | Status: DC
  Administered 2023-08-21: 20 mL/h via INTRAVENOUS

## 2023-08-21 MED ORDER — TRIAMTERENE-HCTZ 37.5-25 MG PO TABS
1.0000 | ORAL_TABLET | Freq: Every day | ORAL | Status: DC
  Administered 2023-08-22: 1 via ORAL
  Filled 2023-08-21 (×2): qty 1

## 2023-08-21 MED ORDER — ONDANSETRON 4 MG PO TBDP: 4.0000 mg | ORAL_TABLET | Freq: Four times a day (QID) | ORAL | Status: DC | PRN

## 2023-08-21 MED ORDER — LIDOCAINE HCL (PF) 2 % IJ SOLN
INTRAMUSCULAR | Status: DC | PRN
  Administered 2023-08-21: 100 mg via INTRADERMAL

## 2023-08-21 MED ORDER — ALPRAZOLAM 0.25 MG PO TABS: 0.2500 mg | ORAL_TABLET | Freq: Two times a day (BID) | ORAL | Status: DC | PRN

## 2023-08-21 MED ORDER — ONDANSETRON HCL 4 MG/2ML IJ SOLN: 4.0000 mg | Freq: Four times a day (QID) | INTRAMUSCULAR | Status: DC | PRN

## 2023-08-21 MED ORDER — TRAMADOL HCL 50 MG PO TABS: 50.0000 mg | ORAL_TABLET | Freq: Four times a day (QID) | ORAL | Status: DC | PRN

## 2023-08-21 MED ORDER — ACETAMINOPHEN 500 MG PO TABS
1000.0000 mg | ORAL_TABLET | Freq: Four times a day (QID) | ORAL | Status: DC
  Administered 2023-08-21 – 2023-08-22 (×3): 1000 mg via ORAL
  Filled 2023-08-21 (×3): qty 2

## 2023-08-21 MED ORDER — PROPOFOL 500 MG/50ML IV EMUL
INTRAVENOUS | Status: DC | PRN
  Administered 2023-08-21: 100 mg via INTRAVENOUS
  Administered 2023-08-21: 200 ug/kg/min via INTRAVENOUS

## 2023-08-21 MED ORDER — PROPOFOL 1000 MG/100ML IV EMUL
INTRAVENOUS | Status: AC
  Filled 2023-08-21: qty 100

## 2023-08-21 MED ORDER — ZOLPIDEM TARTRATE 5 MG PO TABS
5.0000 mg | ORAL_TABLET | Freq: Every day | ORAL | Status: DC
  Administered 2023-08-21: 5 mg via ORAL
  Filled 2023-08-21: qty 1

## 2023-08-21 MED ORDER — FLUOXETINE HCL 20 MG PO CAPS
20.0000 mg | ORAL_CAPSULE | Freq: Every day | ORAL | Status: DC
  Administered 2023-08-21 – 2023-08-22 (×2): 20 mg via ORAL
  Filled 2023-08-21 (×2): qty 1

## 2023-08-21 MED ORDER — IOHEXOL 350 MG/ML SOLN
75.0000 mL | Freq: Once | INTRAVENOUS | Status: AC | PRN
  Administered 2023-08-21: 75 mL via INTRAVENOUS

## 2023-08-21 MED ORDER — STERILE WATER FOR IRRIGATION IR SOLN
Status: DC | PRN
  Administered 2023-08-21: 60 mL

## 2023-08-21 MED ORDER — OXYCODONE HCL 5 MG PO TABS
5.0000 mg | ORAL_TABLET | ORAL | Status: DC | PRN
  Administered 2023-08-21: 10 mg via ORAL
  Filled 2023-08-21: qty 2

## 2023-08-21 MED ORDER — LIDOCAINE HCL (PF) 2 % IJ SOLN
INTRAMUSCULAR | Status: AC
  Filled 2023-08-21: qty 5

## 2023-08-21 MED ORDER — SODIUM CHLORIDE 0.9 % IV SOLN: INTRAVENOUS | Status: DC

## 2023-08-21 MED ORDER — ZOLPIDEM TARTRATE 5 MG PO TABS: 10.0000 mg | ORAL_TABLET | Freq: Every day | ORAL | Status: DC

## 2023-08-21 MED ORDER — PANTOPRAZOLE SODIUM 40 MG PO TBEC
40.0000 mg | DELAYED_RELEASE_TABLET | Freq: Two times a day (BID) | ORAL | Status: DC
  Administered 2023-08-21 – 2023-08-22 (×2): 40 mg via ORAL
  Filled 2023-08-21 (×2): qty 1

## 2023-08-21 MED ORDER — PLECANATIDE 3 MG PO TABS: 1.0000 | ORAL_TABLET | Freq: Every day | ORAL | Status: DC

## 2023-08-21 NOTE — Progress Notes (Signed)
CT angiogram obtained. Blood seen in the cul-de-sac but no active bleeding . contacted surgery for recommendations.

## 2023-08-21 NOTE — Anesthesia Preprocedure Evaluation (Signed)
Anesthesia Evaluation  Patient identified by MRN, date of birth, ID band Patient awake    Reviewed: Allergy & Precautions, H&P , NPO status , Patient's Chart, lab work & pertinent test results  History of Anesthesia Complications Negative for: history of anesthetic complications  Airway Mallampati: III  TM Distance: >3 FB Neck ROM: limited    Dental  (+) Chipped, Poor Dentition, Dental Advidsory Given,    Pulmonary neg shortness of breath, neg recent URI, former smoker   Pulmonary exam normal        Cardiovascular Exercise Tolerance: Good hypertension, (-) angina (-) Past MI and (-) Cardiac Stents Normal cardiovascular exam(-) dysrhythmias (-) Valvular Problems/Murmurs     Neuro/Psych neg Seizures PSYCHIATRIC DISORDERS Anxiety Depression     Neuromuscular disease    GI/Hepatic Neg liver ROS,GERD  Medicated and Controlled,,  Endo/Other  negative endocrine ROS    Renal/GU Renal diseaseS/p RCC     Musculoskeletal   Abdominal  (+) + obese  Peds  Hematology negative hematology ROS (+)   Anesthesia Other Findings Past Medical History: No date: Arthritis No date: Cancer (HCC)     Comment:  RENAL CELL No date: Depression No date: GERD (gastroesophageal reflux disease)     Comment:  OCC NO MEDS No date: Insomnia  Past Surgical History: No date: ABDOMINAL HYSTERECTOMY No date: APPENDECTOMY No date: BACK SURGERY     Comment:  "rods in back" 08/28/2019: COLONOSCOPY WITH PROPOFOL; N/A     Comment:  Procedure: COLONOSCOPY WITH PROPOFOL;  Surgeon:               Pasty Spillers, MD;  Location: [facility] ENDOSCOPY;                Service: Endoscopy;  Laterality: N/A; 2012: cryoablation for renal cell carcinoma 09/06/2020: ETHMOIDECTOMY; Left     Comment:  Procedure: TOTAL ETHMOIDECTOMY;  Surgeon: Geanie Logan,              MD;  Location: [facility];  Service: ENT;                Laterality: Left; 09/06/2020:  FRONTAL SINUS EXPLORATION; Left     Comment:  Procedure: FRONTAL SINUS EXPLORATION;  Surgeon: Geanie Logan, MD;  Location: [facility] SURGERY [facility];  Service: ENT;               Laterality: Left; 09/06/2020: IMAGE GUIDED SINUS SURGERY; N/A     Comment:  Procedure: IMAGE GUIDED SINUS SURGERY;  Surgeon:               Geanie Logan, MD;  Location: [facility];                Service: ENT;  Laterality: N/A;  need stryker disk PLACE              DISK ON OR CHARGE NURSE DESK 10-01 KP No date: JOINT REPLACEMENT No date: LUMBAR FUSION 09/06/2020: MAXILLARY ANTROSTOMY; Left     Comment:  Procedure: MAXILLARY ANTROSTOMY WITH TISSUE;  Surgeon:               Geanie Logan, MD;  Location: [facility];                Service: ENT;  Laterality: Left; No date: MEDIAL PARTIAL KNEE REPLACEMENT; Left No date: PARTIAL HYSTERECTOMY 09/06/2020: SPHENOIDECTOMY; Left     Comment:  Procedure: SPHENOIDECTOMY;  Surgeon:  Geanie Logan, MD;               Location: [facility] SUR[facility]ERY [facility];  Service: ENT;                Laterality: Left; No date: TONSILLECTOMY 2012: VENTRAL HERNIA REPAIR     Comment:  Dr. Thelma Barge     Reproductive/Obstetrics negative OB ROS                             Anesthesia Physical Anesthesia Plan  ASA: 2  Anesthesia Plan: General   Post-op Pain Management: Minimal or no pain anticipated   Induction: Intravenous  PONV Risk Score and Plan: 3 and Treatment may vary due to age or medical condition, Propofol infusion and TIVA  Airway Management Planned: Natural Airway and Nasal Cannula  Additional Equipment:   Intra-op Plan:   Post-operative Plan:   Informed Consent: I have reviewed the patients History and Physical, chart, labs and discussed the procedure including the risks, benefits and alternatives for the proposed anesthesia with the patient or authorized representative who has indicated his/her understanding and acceptance.      Dental Advisory Given  Plan Discussed with: Anesthesiologist, CRNA and Surgeon  Anesthesia Plan Comments:         Anesthesia Quick Evaluation

## 2023-08-21 NOTE — Op Note (Signed)
[facility] Gastroenterology Patient Name: Tracy Chase Procedure Date: 08/21/2023 7:03 AM MRN: [id number] Account #: [id number] Date of Birth: [birth year] Admit Type: Outpatient Age: 67 Room: [facility] ENDO ROOM 2 Gender: Female Note Status: Finalized Instrument Name: Prentice Docker [id number] Procedure:             Colonoscopy Indications:           Screening for colorectal malignant neoplasm Providers:             [name] MD, MD Referring MD:          Bari Edward, MD (Referring MD) Medicines:             Monitored Anesthesia Care Complications:         No immediate complications. Procedure:             Pre-Anesthesia Assessment:                        - Prior to the procedure, a History and Physical was                         performed, and patient medications, allergies and                         sensitivities were reviewed. The patient's tolerance                         of previous anesthesia was reviewed.                        - The risks and benefits of the procedure and the                         sedation options and risks were discussed with the                         patient. All questions were answered and informed                         consent was obtained.                        - ASA Grade Assessment: II - A patient with mild                         systemic disease.                        After obtaining informed consent, the colonoscope was                         passed under direct vision. Throughout the procedure,                         the patient's blood pressure, pulse, and oxygen                         saturations were monitored continuously. The                         Colonoscope was introduced  through the anus and                         advanced to the the cecum, identified by the                         appendiceal orifice. The colonoscopy was performed                         with ease. The patient tolerated the procedure well.                          The quality of the bowel preparation was excellent.                         The ileocecal valve, appendiceal orifice, and rectum                         were photographed. Findings:      Multiple diverticula were found in the left colon.      The exam was otherwise without abnormality on direct and retroflexion       views. Impression:            - Diverticulosis in the left colon.                        - The examination was otherwise normal on direct and                         retroflexion views.                        - No specimens collected. Recommendation:        - Discharge patient to home (with escort).                        - Resume previous diet.                        - Continue present medications.                        - Repeat colonoscopy in 5 years for surveillance. Procedure Code(s):     --- Professional ---                        [phone], Colonoscopy, flexible; diagnostic, including                         collection of specimen(s) by brushing or washing, when                         performed (separate procedure) Diagnosis Code(s):     --- Professional ---                        Z12.11, Encounter for screening for malignant neoplasm                         of colon  K57.30, Diverticulosis of large intestine without                         perforation or abscess without bleeding CPT copyright 2022 American Medical Association. All rights reserved. The codes documented in this report are preliminary and upon coder review may  be revised to meet current compliance requirements. [name], MD [name] MD, MD 08/21/2023 8:28:51 AM This report has been signed electronically. Number of Addenda: 0 Note Initiated On: 08/21/2023 7:03 AM Scope Withdrawal Time: 0 hours 11 minutes 46 seconds  Total Procedure Duration: 0 hours 15 minutes 51 seconds  Estimated Blood Loss:  Estimated blood loss: none.      [facility]

## 2023-08-21 NOTE — H&P (Signed)
[name], MD [address], [place], [place], [address] [address], [place], [place], [address] Phone: [phone]  Fax: [phone]  Primary Care Physician:  Tracy Milan, MD   Pre-Procedure History & Physical: HPI:  Tracy Chase is a 67 y.o. female is here for an endoscopy and colonoscopy    Past Medical History:  Diagnosis Date   Anemia    h/o   Aortic atherosclerosis (HCC)    Arthritis    Cancer (HCC)    RENAL CELL   Depression    GERD (gastroesophageal reflux disease)    OCC NO MEDS   Hypertension    Incisional hernia    Insomnia    Rib lesion    3 x 2.1 x 2 cm expansile right fourth rib posteriorly   Sepsis [facility])     Past Surgical History:  Procedure Laterality Date   ABDOMINAL HYSTERECTOMY     APPENDECTOMY  2011   Lap converted to exlap appendectomy, drainage of abd abscesses   CESAREAN SECTION     COLONOSCOPY WITH PROPOFOL N/A 08/28/2019   Procedure: COLONOSCOPY WITH PROPOFOL;  Surgeon: Pasty Spillers, MD;  Location: [facility] ENDOSCOPY;  Service: Endoscopy;  Laterality: N/A;   cryoablation for renal cell carcinoma  2012   ETHMOIDECTOMY Left 09/06/2020   Procedure: TOTAL ETHMOIDECTOMY;  Surgeon: Geanie Logan, MD;  Location: [facility];  Service: ENT;  Laterality: Left;   FRONTAL SINUS EXPLORATION Left 09/06/2020   Procedure: FRONTAL SINUS EXPLORATION;  Surgeon: Geanie Logan, MD;  Location: [facility];  Service: ENT;  Laterality: Left;   IMAGE GUIDED SINUS SURGERY N/A 09/06/2020   Procedure: IMAGE GUIDED SINUS SURGERY;  Surgeon: Geanie Logan, MD;  Location: [facility];  Service: ENT;  Laterality: N/A;  need stryker disk PLACE DISK ON OR CHARGE NURSE DESK 10-01 KP   INSERTION OF MESH  01/10/2023   Procedure: INSERTION OF MESH;  Surgeon: Henrene Dodge, MD;  Location: [facility] ORS;  Service: General;;   JOINT REPLACEMENT Left    partial knee replacement   LUMBAR FUSION  1994   MAXILLARY  ANTROSTOMY Left 09/06/2020   Procedure: MAXILLARY ANTROSTOMY WITH TISSUE;  Surgeon: Geanie Logan, MD;  Location: [facility];  Service: ENT;  Laterality: Left;   MEDIAL PARTIAL KNEE REPLACEMENT Left    SPHENOIDECTOMY Left 09/06/2020   Procedure: SPHENOIDECTOMY;  Surgeon: Geanie Logan, MD;  Location: [facility];  Service: ENT;  Laterality: Left;   TONSILLECTOMY     TOTAL KNEE REVISION Left 02/21/2021   Procedure: Revision, left partial to total knee;  Surgeon: Kennedy Bucker, MD;  Location: [facility] ORS;  Service: Orthopedics;  Laterality: Left;  CHRIS GAINES TO ASSIST   VENTRAL HERNIA REPAIR  2012   open ventral hernia repair with mesh   XI ROBOTIC ASSISTED VENTRAL HERNIA N/A 01/10/2023   Procedure: XI ROBOTIC ASSISTED VENTRAL HERNIA WITH LYSIS OF ADHESIONS;  Surgeon: Henrene Dodge, MD;  Location: [facility] ORS;  Service: General;  Laterality: N/A;    Prior to Admission medications   Medication Sig Start Date End Date Taking? Authorizing Provider  ALPRAZolam (XANAX) 0.25 MG tablet Take 1 tablet (0.25 mg total) by mouth 2 (two) times daily as needed for anxiety. 08/15/23  Yes Tracy Milan, MD  FLUoxetine (PROZAC) 20 MG capsule TAKE 1 CAPSULE BY MOUTH DAILY Patient taking differently: Take 20 mg by mouth every morning. 11/09/22  Yes Tracy Milan, MD  pantoprazole (PROTONIX) 40 MG  tablet Take 1 tablet (40 mg total) by mouth daily. 06/21/23  Yes Chase, Jose, MD  Plecanatide (TRULANCE) 3 MG TABS Take 1 tablet (3 mg total) by mouth daily. 06/26/23 09/24/23 Yes Tracy Amy, PA-C  traMADol (ULTRAM) 50 MG tablet Take 1 tablet (50 mg total) by mouth every 6 (six) hours as needed for moderate pain. 05/29/23 05/28/24 Yes Chase, Tracy Quick, MD  triamterene-hydrochlorothiazide (MAXZIDE-25) 37.5-25 MG tablet Take 1 tablet by mouth daily. 05/20/23  Yes Tracy Milan, MD  zolpidem (AMBIEN) 10 MG tablet Take 1 tablet (10 mg total) by mouth at bedtime. 07/12/23  Yes Tracy Milan, MD   pantoprazole (PROTONIX) 40 MG tablet Take 1 tablet (40 mg total) by mouth 2 (two) times daily. 06/06/23 07/06/23  Tracy Kail, PA-C    Allergies as of 07/02/2023 - Review Complete 06/28/2023  Allergen Reaction Noted   Hydrocodone Itching 08/16/2015    Family History  Problem Relation Age of Onset   Kidney failure Mother    Congestive Heart Failure Father    Breast cancer Neg Hx     Social History   Socioeconomic History   Marital status: Significant Other    Spouse name: Not on file   Number of children: 1   Years of education: Not on file   Highest education level: Not on file  Occupational History   Occupation: Retired  Tobacco Use   Smoking status: Former    Current packs/day: 0.00    Types: Cigarettes    Quit date: 1990    Years since quitting: 34.7    Passive exposure: Past   Smokeless tobacco: Never   Tobacco comments:    was "social Smoker" over 15 yrs ago  Vaping Use   Vaping status: Never Used  Substance and Sexual Activity   Alcohol use: Yes    Comment: 2-3 times weekly   Drug use: No   Sexual activity: Not on file  Other Topics Concern   Not on file  Social History Narrative   Pt has 1 son and 2 step daughters. Lives with significant other   Social Determinants of Health   Financial Resource Strain: Low Risk  (07/30/2023)   Overall Financial Resource Strain (CARDIA)    Difficulty of Paying Living Expenses: Not hard at all  Food Insecurity: No Food Insecurity (07/30/2023)   Hunger Vital Sign    Worried About Running Out of Food in the Last Year: Never true    Ran Out of Food in the Last Year: Never true  Transportation Needs: No Transportation Needs (07/30/2023)   PRAPARE - Administrator, Civil Service (Medical): No    Lack of Transportation (Non-Medical): No  Physical Activity: Sufficiently Active (07/30/2023)   Exercise Vital Sign    Days of Exercise per Week: 4 days    Minutes of Exercise per Session: 40 min  Stress: No Stress  Concern Present (07/30/2023)   [employer] of Occupational Health - Occupational Stress Questionnaire    Feeling of Stress : Not at all  Social Connections: Moderately Isolated (07/30/2023)   Social Connection and Isolation Panel [NHANES]    Frequency of Communication with Friends and Family: More than three times a week    Frequency of Social Gatherings with Friends and Family: Twice a week    Attends Religious Services: Never    Database administrator or Organizations: No    Attends Banker Meetings: Never    Marital Status: Living with partner  Intimate Partner Violence: Not At Risk (07/30/2023)   Humiliation, Afraid, Rape, and Kick questionnaire    Fear of Current or Ex-Partner: No    Emotionally Abused: No    Physically Abused: No    Sexually Abused: No    Review of Systems: See HPI, otherwise negative ROS  Physical Exam: BP (!) 145/65   Pulse (!) 55   Temp (!) 96.7 F (35.9 C) (Temporal)   Resp 20   Ht 5\' 6"  (1.676 m)   Wt 79.9 kg   SpO2 100%   BMI 28.44 kg/m  General:   Alert,  pleasant and cooperative in NAD Head:  Normocephalic and atraumatic. Neck:  Supple; no masses or thyromegaly. Lungs:  Clear throughout to auscultation, normal respiratory effort.    Heart:  +S1, +S2, Regular rate and rhythm, No edema. Abdomen:  Soft, nontender and nondistended. Normal bowel sounds, without guarding, and without rebound.   Neurologic:  Alert and  oriented x4;  grossly normal neurologically.  Impression/Plan: Tracy Chase is here for an endoscopy and colonoscopy  to be performed for  evaluation of duodena, diverticulitis and colon cancer screening     Risks, benefits, limitations, and alternatives regarding endoscopy have been reviewed with the patient.  Questions have been answered.  All parties agreeable.   [name], MD  08/21/2023, 7:59 AM

## 2023-08-21 NOTE — Consult Note (Signed)
Cardiology Consultation   Patient ID: [name] MRN: [id number]; DOB: 01-13-1956  Admit date: 08/21/2023 Date of Consult: 08/21/2023  PCP:  Reubin Milan, MD   [facility] Providers Cardiologist:  Debbe Odea, MD       Patient Profile:   Tracy Chase is a 67 y.o. female with a hx of hypertension and hyperlipidemia who is being seen 08/21/2023 for the evaluation of left shoulder pain and abnormal EKG at the request of Dr. Tobi Bastos.  History of Present Illness:   Tracy Chase presented today for screening colonoscopy.  Procedure was uneventful, demonstrating diverticulosis.  No intervention was performed.  After waking up from anesthesia, Tracy Chase began complaining of left shoulder pain, which is new for her.  Of note, she was positioned in the left lateral decubitus position during the procedure.  She denies chest pain, shortness of breath, palpitations, and nausea.  She notes some left-sided abdominal pain.  She has mild lower extremity edema that is chronic.  Tracy Chase has not had any chest pain or shoulder pain leading up to today's procedure.  She notes that she is not as active as she used to be.  She was evaluated in 2021 and 2022 by Dr. Azucena Cecil due to precordial pain.  Coronary CTA at that time showed absence of CAD.  Echocardiogram showed normal LVEF with grade 1 diastolic dysfunction, mildly reduced RV function and aortic sclerosis without stenosis.   Past Medical History:  Diagnosis Date   Anemia    h/o   Aortic atherosclerosis (HCC)    Arthritis    Cancer (HCC)    RENAL CELL   Depression    GERD (gastroesophageal reflux disease)    OCC NO MEDS   Hypertension    Incisional hernia    Insomnia    Rib lesion    3 x 2.1 x 2 cm expansile right fourth rib posteriorly   Sepsis [facility])     Past Surgical History:  Procedure Laterality Date   ABDOMINAL HYSTERECTOMY     APPENDECTOMY  2011   Lap converted to exlap appendectomy, drainage of abd  abscesses   CESAREAN SECTION     COLONOSCOPY WITH PROPOFOL N/A 08/28/2019   Procedure: COLONOSCOPY WITH PROPOFOL;  Surgeon: Pasty Spillers, MD;  Location: [facility] ENDOSCOPY;  Service: Endoscopy;  Laterality: N/A;   cryoablation for renal cell carcinoma  2012   ETHMOIDECTOMY Left 09/06/2020   Procedure: TOTAL ETHMOIDECTOMY;  Surgeon: Geanie Logan, MD;  Location: [facility];  Service: ENT;  Laterality: Left;   FRONTAL SINUS EXPLORATION Left 09/06/2020   Procedure: FRONTAL SINUS EXPLORATION;  Surgeon: Geanie Logan, MD;  Location: [facility];  Service: ENT;  Laterality: Left;   IMAGE GUIDED SINUS SURGERY N/A 09/06/2020   Procedure: IMAGE GUIDED SINUS SURGERY;  Surgeon: Geanie Logan, MD;  Location: [facility];  Service: ENT;  Laterality: N/A;  need stryker disk PLACE DISK ON OR CHARGE NURSE DESK 10-01 KP   INSERTION OF MESH  01/10/2023   Procedure: INSERTION OF MESH;  Surgeon: Henrene Dodge, MD;  Location: [facility] ORS;  Service: General;;   JOINT REPLACEMENT Left    partial knee replacement   LUMBAR FUSION  1994   MAXILLARY ANTROSTOMY Left 09/06/2020   Procedure: MAXILLARY ANTROSTOMY WITH TISSUE;  Surgeon: Geanie Logan, MD;  Location: [facility];  Service: ENT;  Laterality: Left;   MEDIAL PARTIAL KNEE REPLACEMENT Left    SPHENOIDECTOMY Left 09/06/2020   Procedure: SPHENOIDECTOMY;  Surgeon:  Geanie Logan, MD;  Location: [facility];  Service: ENT;  Laterality: Left;   TONSILLECTOMY     TOTAL KNEE REVISION Left 02/21/2021   Procedure: Revision, left partial to total knee;  Surgeon: Kennedy Bucker, MD;  Location: [facility] ORS;  Service: Orthopedics;  Laterality: Left;  CHRIS GAINES TO ASSIST   VENTRAL HERNIA REPAIR  2012   open ventral hernia repair with mesh   XI ROBOTIC ASSISTED VENTRAL HERNIA N/A 01/10/2023   Procedure: XI ROBOTIC ASSISTED VENTRAL HERNIA WITH LYSIS OF ADHESIONS;  Surgeon: Henrene Dodge, MD;  Location: [facility] ORS;  Service: General;   Laterality: N/A;     Home Medications:  Prior to Admission medications   Medication Sig Start Date Juliah Scadden Date Taking? Authorizing Provider  ALPRAZolam (XANAX) 0.25 MG tablet Take 1 tablet (0.25 mg total) by mouth 2 (two) times daily as needed for anxiety. 08/15/23  Yes Reubin Milan, MD  FLUoxetine (PROZAC) 20 MG capsule TAKE 1 CAPSULE BY MOUTH DAILY Patient taking differently: Take 20 mg by mouth every morning. 11/09/22  Yes Reubin Milan, MD  pantoprazole (PROTONIX) 40 MG tablet Take 1 tablet (40 mg total) by mouth daily. 06/21/23  Yes Piscoya, Jose, MD  Plecanatide (TRULANCE) 3 MG TABS Take 1 tablet (3 mg total) by mouth daily. 06/26/23 09/24/23 Yes Celso Amy, PA-C  traMADol (ULTRAM) 50 MG tablet Take 1 tablet (50 mg total) by mouth every 6 (six) hours as needed for moderate pain. 05/29/23 05/28/24 Yes Piscoya, Elita Quick, MD  triamterene-hydrochlorothiazide (MAXZIDE-25) 37.5-25 MG tablet Take 1 tablet by mouth daily. 05/20/23  Yes Reubin Milan, MD  zolpidem (AMBIEN) 10 MG tablet Take 1 tablet (10 mg total) by mouth at bedtime. 07/12/23  Yes Reubin Milan, MD  pantoprazole (PROTONIX) 40 MG tablet Take 1 tablet (40 mg total) by mouth 2 (two) times daily. 06/06/23 07/06/23  Donovan Kail, PA-C    Inpatient Medications: Scheduled Meds:  Continuous Infusions:  sodium chloride     PRN Meds:   Allergies:    Allergies  Allergen Reactions   Hydrocodone Itching    Social History:   Social History   Tobacco Use   Smoking status: Former    Current packs/day: 0.00    Types: Cigarettes    Quit date: 1990    Years since quitting: 34.7    Passive exposure: Past   Smokeless tobacco: Never   Tobacco comments:    was "social Smoker" over 15 yrs ago  Vaping Use   Vaping status: Never Used  Substance Use Topics   Alcohol use: Yes    Comment: 2-3 times weekly   Drug use: No     Family History:   Family History  Problem Relation Age of Onset   Kidney failure Mother     Congestive Heart Failure Father    Breast cancer Neg Hx      ROS:  Please see the history of present illness. All other ROS reviewed and negative.     Physical Exam/Data:   Vitals:   08/21/23 0919 08/21/23 0929 08/21/23 0939 08/21/23 0949  BP: (!) 142/70 (!) 144/67 (!) 155/72 136/77  Pulse: (!) 56 (!) 58 (!) 55 (!) 49  Resp: (!) 21 14 13 13   Temp:      TempSrc:      SpO2: 100% 99% 99% 100%  Weight:      Height:        Intake/Output Summary (Last 24 hours) at 08/21/2023 0957 Last data filed  at 08/21/2023 0825 Gross per 24 hour  Intake 500 ml  Output --  Net 500 ml      08/21/2023    7:11 AM 07/04/2023   11:33 AM 06/26/2023    9:06 AM  Last 3 Weights  Weight (lbs) 176 lb 3.2 oz 174 lb 174 lb 3.2 oz  Weight (kg) 79.924 kg 78.926 kg 79.017 kg     Body mass index is 28.44 kg/m.  General:  Well nourished, well developed, in no acute distress. HEENT: normal Neck: no JVD Vascular: 2+ radial pulses bilaterally. Cardiac: Bradycardic but regular without murmurs, rubs, or gallops. Lungs:  clear to auscultation bilaterally, no wheezing, rhonchi or rales  Abd: soft, nontender, no hepatomegaly  Ext: Trace pretibial edema bilaterally. Musculoskeletal:  No deformities, BUE and BLE strength normal and equal Skin: warm and dry  Neuro:  CNs 2-12 intact, no focal abnormalities noted Psych:  Normal affect   EKG:  The EKG was personally reviewed and demonstrates: Sinus bradycardia with poor R wave progression and anteroseptal T wave inversions.  Relevant CV Studies: Coronary CTA (11/03/2020): Normal coronary arteries without calcification or plaque.  No significant extracardiac findings in the chest.  TTE (10/26/2020): Normal LV size with mild LVH.  LVEF 60-65% with grade 1 diastolic dysfunction.  Normal RV size with mildly reduced function.  Trivial MR.  Tricuspid aortic valve with mild calcification and thickening consistent with aortic sclerosis without stenosis.  CVP 8  mmHg.  Laboratory Data:  High Sensitivity Troponin:  No results for input(s): "TROPONINIHS" in the last 720 hours.   ChemistryNo results for input(s): "NA", "K", "CL", "CO2", "GLUCOSE", "BUN", "CREATININE", "CALCIUM", "MG", "GFRNONAA", "GFRAA", "ANIONGAP" in the last 168 hours.  No results for input(s): "PROT", "ALBUMIN", "AST", "ALT", "ALKPHOS", "BILITOT" in the last 168 hours. Lipids No results for input(s): "CHOL", "TRIG", "HDL", "LABVLDL", "LDLCALC", "CHOLHDL" in the last 168 hours.  HematologyNo results for input(s): "WBC", "RBC", "HGB", "HCT", "MCV", "MCH", "MCHC", "RDW", "PLT" in the last 168 hours. Thyroid No results for input(s): "TSH", "FREET4" in the last 168 hours.  BNPNo results for input(s): "BNP", "PROBNP" in the last 168 hours.  DDimer No results for input(s): "DDIMER" in the last 168 hours.   Radiology/Studies:  No results found.   Assessment and Plan:   Left shoulder pain and abnormal EKG: Tracy Chase had been feeling well up until recovery from her colonoscopy today when she complained of left shoulder pain.  She denies chest pain and shortness of breath.  I suspect her pain is most likely musculoskeletal in nature, though referred pain from an intra-abdominal process or atypical angina are considerations.  Her nonspecific EKG changes could be due to differences in the lead placement or electrolyte abnormalities.  I recommend obtaining a CBC, CMP, and serial troponins.  I have also ordered a chest radiograph.  If these studies are unrevealing, I think it is reasonable to defer additional cardiac workup unless the patient develops worsening left shoulder or chest pain.  I will defer potential abdominal imaging to Dr. Tobi Bastos.  Unless high-sensitivity troponin I returns abnormal, I think it is reasonable to defer antiplatelet/antithrombotic therapy for the time being.  Findings and recommendations were discussed with Drs. Tobi Bastos and Karlton Lemon in the endoscopy PACU.  For questions or  updates, please contact [facility] Please consult [email or web address] for contact info under [facility].  Signed, Yvonne Kendall, MD  08/21/2023 9:57 AM

## 2023-08-21 NOTE — Anesthesia Postprocedure Evaluation (Signed)
Anesthesia Post Note  Patient: NATASSJA OLLIS  Procedure(s) Performed: COLONOSCOPY WITH PROPOFOL ESOPHAGOGASTRODUODENOSCOPY (EGD) WITH PROPOFOL  Patient location during evaluation: Endoscopy Anesthesia Type: General Level of consciousness: awake and alert Pain management: pain level controlled Vital Signs Assessment: post-procedure vital signs reviewed and stable Respiratory status: spontaneous breathing, nonlabored ventilation, respiratory function stable and patient connected to nasal cannula oxygen Cardiovascular status: blood pressure returned to baseline and stable Postop Assessment: no apparent nausea or vomiting Anesthetic complications: no   There were no known notable events for this encounter.   Last Vitals:  Vitals:   08/21/23 1039 08/21/23 1049  BP: 139/77 (!) 154/72  Pulse: (!) 51 (!) 47  Resp: 16 13  Temp:    SpO2: 99% 98%    Last Pain:  Vitals:   08/21/23 1049  TempSrc:   PainSc: 4                  Lenard Simmer

## 2023-08-21 NOTE — H&P (Signed)
[facility] SURGICAL HISTORY & PHYSICAL (cpt [id number])  HISTORY OF PRESENT ILLNESS (HPI):  67 y.o. female presented to [facility] this morning for scheduled colonoscopy and EGD. This was uneventful. Discussed with Dr Tobi Bastos and reviewed images as well. However, following completion, she reports developing LUQ abdominal pain and left lower chest pain. This was not present prior to the procedure. This radiated to her left shoulder as well. She underwent cardiac work up initially which was reassuring. She underwent CT Abdomen/Pelvis which was concerning for new fluid attenuation in the LUQ concerning for possible hemoperitoneum. She had follow up CTA which did not show any active extravasation. She had CBC as well which showed stable Hgb to 11.0. She has remained hemodynamically stable. She denied any previous trauma or falls in the last few days. She is not on any anticoagulation. She is known to [place] and has significant prior abdominal surgical history.   General surgery is consulted by gastroenterology physician Dr [name], MD for evaluation and management of hemoperitoneum.    PAST MEDICAL HISTORY (PMH):  Past Medical History:  Diagnosis Date   Anemia    h/o   Aortic atherosclerosis (HCC)    Arthritis    Cancer (HCC)    RENAL CELL   Depression    GERD (gastroesophageal reflux disease)    OCC NO MEDS   Hypertension    Incisional hernia    Insomnia    Rib lesion    3 x 2.1 x 2 cm expansile right fourth rib posteriorly   Sepsis (HCC)     Reviewed. Otherwise negative.   PAST SURGICAL HISTORY [facility]):  Past Surgical History:  Procedure Laterality Date   ABDOMINAL HYSTERECTOMY     APPENDECTOMY  2011   Lap converted to exlap appendectomy, drainage of abd abscesses   CESAREAN SECTION     COLONOSCOPY WITH PROPOFOL N/A 08/28/2019   Procedure: COLONOSCOPY WITH PROPOFOL;  Surgeon: Pasty Spillers, MD;  Location: [facility] ENDOSCOPY;  Service: Endoscopy;  Laterality: N/A;    cryoablation for renal cell carcinoma  2012   ETHMOIDECTOMY Left 09/06/2020   Procedure: TOTAL ETHMOIDECTOMY;  Surgeon: Geanie Logan, MD;  Location: [facility] SURGERY CNTR;  Service: ENT;  Laterality: Left;   FRONTAL SINUS EXPLORATION Left 09/06/2020   Procedure: FRONTAL SINUS EXPLORATION;  Surgeon: Geanie Logan, MD;  Location: [facility] SUR[facility]ERY [facility];  Service: ENT;  Laterality: Left;   IMAGE GUIDED SINUS SURGERY N/A 09/06/2020   Procedure: IMAGE GUIDED SINUS SURGERY;  Surgeon: Geanie Logan, MD;  Location: [facility] SURGERY [facility];  Service: ENT;  Laterality: N/A;  need stryker disk PLACE DISK ON OR CHARGE NURSE DESK 10-01 KP   INSERTION OF MESH  01/10/2023   Procedure: INSERTION OF MESH;  Surgeon: Henrene Dodge, MD;  Location: [facility] ORS;  Service: General;;   JOINT REPLACEMENT Left    partial knee replacement   LUMBAR FUSION  1994   MAXILLARY ANTROSTOMY Left 09/06/2020   Procedure: MAXILLARY ANTROSTOMY WITH TISSUE;  Surgeon: Geanie Logan, MD;  Location: [facility];  Service: ENT;  Laterality: Left;   MEDIAL PARTIAL KNEE REPLACEMENT Left    SPHENOIDECTOMY Left 09/06/2020   Procedure: SPHENOIDECTOMY;  Surgeon: Geanie Logan, MD;  Location: [facility];  Service: ENT;  Laterality: Left;   TONSILLECTOMY     TOTAL KNEE REVISION Left 02/21/2021   Procedure: Revision, left partial to total knee;  Surgeon: Kennedy Bucker, MD;  Location: [facility] ORS;  Service: Orthopedics;  Laterality: Left;  CHRIS GAINES TO ASSIST  VENTRAL HERNIA REPAIR  2012   open ventral hernia repair with mesh   XI ROBOTIC ASSISTED VENTRAL HERNIA N/A 01/10/2023   Procedure: XI ROBOTIC ASSISTED VENTRAL HERNIA WITH LYSIS OF ADHESIONS;  Surgeon: Henrene Dodge, MD;  Location: [facility] ORS;  Service: General;  Laterality: N/A;    Reviewed. Otherwise negative.   MEDICATIONS:  Prior to Admission medications   Medication Sig Start Date End Date Taking? Authorizing Provider  ALPRAZolam (XANAX) 0.25 MG tablet Take 1 tablet (0.25 mg  total) by mouth 2 (two) times daily as needed for anxiety. 08/15/23  Yes Reubin Milan, MD  FLUoxetine (PROZAC) 20 MG capsule TAKE 1 CAPSULE BY MOUTH DAILY Patient taking differently: Take 20 mg by mouth every morning. 11/09/22  Yes Reubin Milan, MD  Plecanatide (TRULANCE) 3 MG TABS Take 1 tablet (3 mg total) by mouth daily. 06/26/23 09/24/23 Yes Celso Amy, PA-C  traMADol (ULTRAM) 50 MG tablet Take 1 tablet (50 mg total) by mouth every 6 (six) hours as needed for moderate pain. 05/29/23 05/28/24 Yes Piscoya, Elita Quick, MD  triamterene-hydrochlorothiazide (MAXZIDE-25) 37.5-25 MG tablet Take 1 tablet by mouth daily. 05/20/23  Yes Reubin Milan, MD  zolpidem (AMBIEN) 10 MG tablet Take 1 tablet (10 mg total) by mouth at bedtime. 07/12/23  Yes Reubin Milan, MD  pantoprazole (PROTONIX) 40 MG tablet Take 1 tablet (40 mg total) by mouth 2 (two) times daily. 06/06/23 07/06/23  Donovan Kail, PA-C     ALLERGIES:  Allergies  Allergen Reactions   Hydrocodone Itching     SOCIAL HISTORY:  Social History   Socioeconomic History   Marital status: Significant Other    Spouse name: Not on file   Number of children: 1   Years of education: Not on file   Highest education level: Not on file  Occupational History   Occupation: Retired  Tobacco Use   Smoking status: Former    Current packs/day: 0.00    Types: Cigarettes    Quit date: 1990    Years since quitting: 34.7    Passive exposure: Past   Smokeless tobacco: Never   Tobacco comments:    was "social Smoker" over 15 yrs ago  Vaping Use   Vaping status: Never Used  Substance and Sexual Activity   Alcohol use: Yes    Comment: 2-3 times weekly   Drug use: No   Sexual activity: Not on file  Other Topics Concern   Not on file  Social History Narrative   Pt has 1 son and 2 step daughters. Lives with significant other   Social Determinants of Health   Financial Resource Strain: Low Risk  (07/30/2023)   Overall Financial Resource  Strain (CARDIA)    Difficulty of Paying Living Expenses: Not hard at all  Food Insecurity: No Food Insecurity (07/30/2023)   Hunger Vital Sign    Worried About Running Out of Food in the Last Year: Never true    Ran Out of Food in the Last Year: Never true  Transportation Needs: No Transportation Needs (07/30/2023)   PRAPARE - Administrator, Civil Service (Medical): No    Lack of Transportation (Non-Medical): No  Physical Activity: Sufficiently Active (07/30/2023)   Exercise Vital Sign    Days of Exercise per Week: 4 days    Minutes of Exercise per Session: 40 min  Stress: No Stress Concern Present (07/30/2023)   [employer] of Occupational Health - Occupational Stress Questionnaire    Feeling of Stress :  Not at all  Social Connections: Moderately Isolated (07/30/2023)   Social Connection and Isolation Panel [NHANES]    Frequency of Communication with Friends and Family: More than three times a week    Frequency of Social Gatherings with Friends and Family: Twice a week    Attends Religious Services: Never    Database administrator or Organizations: No    Attends Banker Meetings: Never    Marital Status: Living with partner  Intimate Partner Violence: Not At Risk (07/30/2023)   Humiliation, Afraid, Rape, and Kick questionnaire    Fear of Current or Ex-Partner: No    Emotionally Abused: No    Physically Abused: No    Sexually Abused: No     FAMILY HISTORY:  Family History  Problem Relation Age of Onset   Kidney failure Mother    Congestive Heart Failure Father    Breast cancer Neg Hx     Otherwise negative.   REVIEW OF SYSTEMS:  Review of Systems  Constitutional:  Negative for chills and fever.  Respiratory:  Negative for cough and shortness of breath.   Cardiovascular:  Negative for chest pain and palpitations.  Gastrointestinal:  Positive for abdominal pain. Negative for constipation, diarrhea, nausea and vomiting.  Genitourinary:  Negative  for dysuria and urgency.  Musculoskeletal:  Negative for falls and neck pain.  All other systems reviewed and are negative.   VITAL SIGNS:  Temp:  [96.7 F (35.9 C)] 96.7 F (35.9 C) (10/09 0829) Pulse Rate:  [46-66] 47 (10/09 1049) Resp:  [12-21] 13 (10/09 1049) BP: (111-155)/(65-99) 154/72 (10/09 1049) SpO2:  [96 %-100 %] 98 % (10/09 1049) Weight:  [79.9 kg] 79.9 kg (10/09 0711)     Height: 5\' 6"  (167.6 cm) Weight: 79.9 kg BMI (Calculated): 28.45   PHYSICAL EXAM:  Physical Exam Vitals and nursing note reviewed. Exam conducted with a chaperone present.  Constitutional:      General: She is not in acute distress.    Appearance: Normal appearance. She is normal weight. She is not ill-appearing.     Comments: Patient resting in bed; NAD  HENT:     Head: Normocephalic and atraumatic.  Eyes:     General: No scleral icterus.    Conjunctiva/sclera: Conjunctivae normal.     Pupils: Pupils are equal, round, and reactive to light.  Cardiovascular:     Rate and Rhythm: Normal rate.     Pulses: Normal pulses.     Heart sounds: No murmur heard. Pulmonary:     Effort: Pulmonary effort is normal. No respiratory distress.  Abdominal:     General: Abdomen is flat. A surgical scar is present. There is no distension.     Palpations: Abdomen is soft.     Tenderness: There is abdominal tenderness in the left upper quadrant. There is no guarding or rebound.     Comments: Abdomen is markedly soft, non-distended, no rebound/guarding. She is tender in the LUQ with very deep palpation. No rebound/guarding. She is not peritonitic   Genitourinary:    Comments: Deferred Musculoskeletal:     Right lower leg: No edema.     Left lower leg: No edema.  Skin:    General: Skin is warm and dry.     Coloration: Skin is not pale.     Findings: No erythema.  Neurological:     General: No focal deficit present.     Mental Status: She is alert and oriented to person, place, and time.  Psychiatric:         Mood and Affect: Mood normal.        Behavior: Behavior normal.     INTAKE/OUTPUT:  This shift: Total I/O In: 500 [I.V.:500] Out: -   Last 2 shifts: @IOLAST2SHIFTS @  Labs:     Latest Ref Rng & Units 08/21/2023    9:51 AM 06/06/2023    4:06 AM 06/05/2023    6:51 AM  CBC  WBC 4.0 - 10.5 K/uL 2.8  5.2  4.3   Hemoglobin 12.0 - 15.0 g/dL 62.9  52.8  41.3   Hematocrit 36.0 - 46.0 % 32.9  36.1  34.7   Platelets 150 - 400 K/uL 153  372  359       Latest Ref Rng & Units 08/21/2023    9:51 AM 06/06/2023    4:06 AM 06/05/2023    6:51 AM  CMP  Glucose 70 - 99 mg/dL 244  94  96   BUN 8 - 23 mg/dL <5  <5  <5   Creatinine 0.44 - 1.00 mg/dL 0.10  2.72  5.36   Sodium 135 - 145 mmol/L 136  141  139   Potassium 3.5 - 5.1 mmol/L 3.2  4.2  3.9   Chloride 98 - 111 mmol/L 105  106  106   CO2 22 - 32 mmol/L 24  27  27    Calcium 8.9 - 10.3 mg/dL 8.3  8.6  8.6   Total Protein 6.5 - 8.1 g/dL 5.7     Total Bilirubin 0.3 - 1.2 mg/dL 0.7     Alkaline Phos 38 - 126 U/L 44     AST 15 - 41 U/L 16     ALT 0 - 44 U/L 11       Imaging studies:   CT Abdomen/Pelvis (08/21/2023) personally reviewed which shows fluid near spleen, no free air, no other obvious intra-abdominal etiologies, and radiologist report reviewed below:   IMPRESSION: 1. New trace ascites which appears higher in attenuation adjacent to the spleen, hemoperitoneum can not be excluded. Streak artifact limits evaluation. Recommend CTA of the abdomen and pelvis for further evaluation. 2. No definite splenic laceration.   CTA Abdomen/Pelvis (08/21/2023) personally reviewed which again shows small amount of fluid near spleen, also noted in pelvis, still without free air, no other obvious intra-abdominal etiology, and radiologist report reviewed below:  IMPRESSION: 1. Hemoperitoneum, primarily in the cul-de-sac. No active extravasation. 2. No splenic laceration identified. 3.   Sigmoid diverticulosis. 4.  Aortic Atherosclerosis  (ICD10-I70.0).   Assessment/Plan:  67 y.o. hemodynamically stable female with hemoperitoneum, unknown etiology   - Unclear why she has developed these new small areas of intra-abdominal fluid collections concerning for hemoperitoneum. Fortunately, she is hemodynamically stable and Hgb checked this morning remains stable compared to recent labs. For now, it is not clear where this could be coming from. She has no trauma history as of late. No anticoagulation. For now, I do not think she needs any emergent surgical intervention. Obviously, this may change should her clinical condition change. I do think that out of abundance of caution, she should stay overnight for observation. She is reluctantly agreeable. I will trend her H&H overnight. If she does well and H&H remains stable, she can go home tomorrow. She can have CLD and advance as tolerated. Pain control and antiemetics as needed.   All of the above findings and recommendations were discussed with the patient and her family (husband at bedside), and all of  their questions were answered to their expressed satisfaction.  -- Lynden Oxford, PA-C [facility] 08/21/2023, 4:04 PM M-F: 7am - 4pm

## 2023-08-21 NOTE — Progress Notes (Signed)
GI note    After the colonoscopy was performed in the recovery she complained of left shoulder pain and left upper quadrant pain.  It persisted for more than 20 minutes hence we got an EKG which showed new T wave inversions in lead III, V3, V4.  Discussed with Dr. Karlton Lemon and Dr. And was consulted.  On examination she had tenderness over the left superior aspect of the latissimus dorsi muscle and the sternocleidomastoid muscle.  She did have tenderness over the left lower ribs in the mid axillary line as well as the left upper quadrant.  No abdominal distention bowel sounds are present she was passing gas.  She underwent a CT scan of the abdomen I discussed the results with the radiologist who was not confident that there was some fluid around the spleen was unsure of its nature as there was a lot of interference from the hardware in the back and possibly from motion she recommended a CT angiogram to be performed.  The patient was never tachycardic or hypotensive during the whole process.   As of 1:50 PM we are awaiting  her CT angiogram   Dr [name] MD,MRCP [facility]) Gastroenterology/Hepatology Pager: [phone]

## 2023-08-21 NOTE — Progress Notes (Signed)
[facility]  Date: 08/21/23  Time: 1:37 PM  Patient reevaluated this afternoon.  She reports improvement in her left shoulder pain, which has been position.  No CP or dyspnea reported.  Labs reassuring other than mild hypokalemia, mild anemia, and mild leukopenia.  HS-TnI negative x 3.  CXR with possible atelectasis at the left lung base but otherwise no significant abnormalities.  CT abdomen ordered by Dr. Tobi Bastos completed but still waiting to be read.  Patient's presentation is not consistent with ACS.  Nonspecific T wave changes on EKG could be due to lead placement or hypokalemia.  No further cardiac testing/intervention is recommended at this time.  Patient can be discharged home from a heart standpoint.  We will have her follow-up with Dr. Azucena Cecil or an APP in 2-3 weeks.  Yvonne Kendall, MD [facility]

## 2023-08-21 NOTE — Op Note (Signed)
[facility] Gastroenterology Patient Name: Tracy Chase Procedure Date: 08/21/2023 7:15 AM MRN: [id number] Account #: [id number] Date of Birth: 11-01-1956 Admit Type: Outpatient Age: 67 Room: [facility] ENDO ROOM 2 Gender: Female Note Status: Finalized Instrument Name: Upper Endoscope [id number] Procedure:             Upper GI endoscopy Indications:           Follow-up of acute duodenal ulcer with perforation Providers:             [name] MD, MD Referring MD:          Bari Edward, MD (Referring MD) Medicines:             Monitored Anesthesia Care Complications:         No immediate complications. Procedure:             Pre-Anesthesia Assessment:                        - Prior to the procedure, a History and Physical was                         performed, and patient medications, allergies and                         sensitivities were reviewed. The patient's tolerance                         of previous anesthesia was reviewed.                        - The risks and benefits of the procedure and the                         sedation options and risks were discussed with the                         patient. All questions were answered and informed                         consent was obtained.                        - ASA Grade Assessment: II - A patient with mild                         systemic disease.                        After obtaining informed consent, the endoscope was                         passed under direct vision. Throughout the procedure,                         the patient's blood pressure, pulse, and oxygen                         saturations were monitored continuously. The Endoscope  was introduced through the mouth, and advanced to the                         third part of duodenum. The upper GI endoscopy was                         accomplished with ease. The patient tolerated the                         procedure  well. Findings:      The examined duodenum was normal.      The esophagus was normal.      A small hiatal hernia was present.      The cardia and gastric fundus were normal on retroflexion. Impression:            - Normal examined duodenum.                        - Normal esophagus.                        - Small hiatal hernia.                        - No specimens collected. Recommendation:        - Perform a colonoscopy today. Procedure Code(s):     --- Professional ---                        [phone], Esophagogastroduodenoscopy, flexible,                         transoral; diagnostic, including collection of                         specimen(s) by brushing or washing, when performed                         (separate procedure) Diagnosis Code(s):     --- Professional ---                        K44.9, Diaphragmatic hernia without obstruction or                         gangrene                        K26.1, Acute duodenal ulcer with perforation CPT copyright 2022 American Medical Association. All rights reserved. The codes documented in this report are preliminary and upon coder review may  be revised to meet current compliance requirements. [name], MD [name] MD, MD 08/21/2023 8:08:05 AM This report has been signed electronically. Number of Addenda: 0 Note Initiated On: 08/21/2023 7:15 AM Estimated Blood Loss:  Estimated blood loss: none.      [facility]

## 2023-08-21 NOTE — Transfer of Care (Signed)
Immediate Anesthesia Transfer of Care Note  Patient: Tracy Chase  Procedure(s) Performed: COLONOSCOPY WITH PROPOFOL ESOPHAGOGASTRODUODENOSCOPY (EGD) WITH PROPOFOL  Patient Location: PACU  Anesthesia Type:General  Level of Consciousness: awake  Airway & Oxygen Therapy: Patient Spontanous Breathing  Post-op Assessment: Report given to RN and Post -op Vital signs reviewed and stable  Post vital signs: Reviewed and stable  Last Vitals:  Vitals Value Taken Time  BP 116/99 08/21/23 0829  Temp    Pulse 66 08/21/23 0829  Resp 16 08/21/23 0829  SpO2 100 % 08/21/23 0829  Vitals shown include unfiled device data.  Last Pain:  Vitals:   08/21/23 0711  TempSrc: Temporal  PainSc: 0-No pain         Complications: There were no known notable events for this encounter.

## 2023-08-22 ENCOUNTER — Encounter: Payer: Self-pay | Admitting: Gastroenterology

## 2023-08-22 ENCOUNTER — Other Ambulatory Visit: Payer: Self-pay | Admitting: Thoracic Surgery (Cardiothoracic Vascular Surgery)

## 2023-08-22 DIAGNOSIS — Z85528 Personal history of other malignant neoplasm of kidney: Secondary | ICD-10-CM | POA: Diagnosis not present

## 2023-08-22 DIAGNOSIS — K661 Hemoperitoneum: Secondary | ICD-10-CM | POA: Diagnosis not present

## 2023-08-22 DIAGNOSIS — Z96652 Presence of left artificial knee joint: Secondary | ICD-10-CM | POA: Diagnosis not present

## 2023-08-22 DIAGNOSIS — Z1211 Encounter for screening for malignant neoplasm of colon: Secondary | ICD-10-CM | POA: Diagnosis not present

## 2023-08-22 DIAGNOSIS — Z79899 Other long term (current) drug therapy: Secondary | ICD-10-CM | POA: Diagnosis not present

## 2023-08-22 DIAGNOSIS — I10 Essential (primary) hypertension: Secondary | ICD-10-CM | POA: Diagnosis not present

## 2023-08-22 DIAGNOSIS — K261 Acute duodenal ulcer with perforation: Secondary | ICD-10-CM | POA: Diagnosis not present

## 2023-08-22 DIAGNOSIS — Z87891 Personal history of nicotine dependence: Secondary | ICD-10-CM | POA: Diagnosis not present

## 2023-08-22 DIAGNOSIS — M899 Disorder of bone, unspecified: Secondary | ICD-10-CM

## 2023-08-22 DIAGNOSIS — K449 Diaphragmatic hernia without obstruction or gangrene: Secondary | ICD-10-CM | POA: Diagnosis not present

## 2023-08-22 DIAGNOSIS — K5730 Diverticulosis of large intestine without perforation or abscess without bleeding: Secondary | ICD-10-CM | POA: Diagnosis not present

## 2023-08-22 LAB — CBC
HCT: 32.1 % — ABNORMAL LOW (ref 36.0–46.0)
Hemoglobin: 10.6 g/dL — ABNORMAL LOW (ref 12.0–15.0)
MCH: 30.2 pg (ref 26.0–34.0)
MCHC: 33 g/dL (ref 30.0–36.0)
MCV: 91.5 fL (ref 80.0–100.0)
Platelets: 162 10*3/uL (ref 150–400)
RBC: 3.51 MIL/uL — ABNORMAL LOW (ref 3.87–5.11)
RDW: 12.9 % (ref 11.5–15.5)
WBC: 3.5 10*3/uL — ABNORMAL LOW (ref 4.0–10.5)
nRBC: 0 % (ref 0.0–0.2)

## 2023-08-22 LAB — COMPREHENSIVE METABOLIC PANEL
ALT: 10 U/L (ref 0–44)
AST: 13 U/L — ABNORMAL LOW (ref 15–41)
Albumin: 3.1 g/dL — ABNORMAL LOW (ref 3.5–5.0)
Alkaline Phosphatase: 41 U/L (ref 38–126)
Anion gap: 8 (ref 5–15)
BUN: <5 mg/dL — ABNORMAL LOW (ref 8–23)
CO2: 26 mmol/L (ref 22–32)
Calcium: 8.3 mg/dL — ABNORMAL LOW (ref 8.9–10.3)
Chloride: 104 mmol/L (ref 98–111)
Creatinine, Ser: 0.75 mg/dL (ref 0.44–1.00)
GFR, Estimated: >60 mL/min (ref >60)
Glucose, Bld: 92 mg/dL (ref 70–99)
Potassium: 3.3 mmol/L — ABNORMAL LOW (ref 3.5–5.1)
Sodium: 138 mmol/L (ref 135–145)
Total Bilirubin: 0.8 mg/dL (ref 0.3–1.2)
Total Protein: 5.4 g/dL — ABNORMAL LOW (ref 6.5–8.1)

## 2023-08-22 NOTE — Discharge Summary (Signed)
[facility] SURGICAL DISCHARGE SUMMARY (cpt: [phone])  Patient ID: Tracy Chase MRN: [id number] DOB/AGE: 69/03/1956 69 y.o.  Admit date: 08/21/2023 Discharge date: 08/22/2023  Discharge Diagnoses Patient Active Problem List   Diagnosis Date Noted   Hemoperitoneum 08/21/2023    Consultants None  Procedures None  HPI: 67 y.o. female presented to [facility] this morning for scheduled colonoscopy and EGD. This was uneventful. Discussed with Dr Tobi Bastos and reviewed images as well. However, following completion, she reports developing LUQ abdominal pain and left lower chest pain. This was not present prior to the procedure. This radiated to her left shoulder as well. She underwent cardiac work up initially which was reassuring. She underwent CT Abdomen/Pelvis which was concerning for new fluid attenuation in the LUQ concerning for possible hemoperitoneum. She had follow up CTA which did not show any active extravasation. She had CBC as well which showed stable Hgb to 11.0. She has remained hemodynamically stable. She denied any previous trauma or falls in the last few days. She is not on any anticoagulation. She is known to [place] and has significant prior abdominal surgical history.     Hospital Course: Patient was admitted to the general surgery for observation out of abundance of caution. She did well and H&H remained stable without hemodynamic changes. The remainder of patient's hospital course was essentially unremarkable, and discharge planning was initiated accordingly with patient safely able to be discharged home with appropriate discharge instructions, and outpatient follow-up after all of her questions were answered to her expressed satisfaction.   Discharge Condition: Good    Physical Examination:  Constitutional: Well appearing female, NAD Pulmonary: Normal effort, no respiratory distress Gastrointestinal: Soft, non-tender, non-distended, no rebound/guarding Skin: Warm, dry     Allergies as of 08/22/2023       Reactions   Hydrocodone Itching        Medication List     TAKE these medications    ALPRAZolam 0.25 MG tablet Commonly known as: XANAX Take 1 tablet (0.25 mg total) by mouth 2 (two) times daily as needed for anxiety.   FLUoxetine 20 MG capsule Commonly known as: PROZAC TAKE 1 CAPSULE BY MOUTH DAILY What changed: when to take this   pantoprazole 40 MG tablet Commonly known as: PROTONIX Take 1 tablet (40 mg total) by mouth 2 (two) times daily.   traMADol 50 MG tablet Commonly known as: Ultram Take 1 tablet (50 mg total) by mouth every 6 (six) hours as needed for moderate pain.   triamterene-hydrochlorothiazide 37.5-25 MG tablet Commonly known as: MAXZIDE-25 Take 1 tablet by mouth daily.   Trulance 3 MG Tabs Generic drug: Plecanatide Take 1 tablet (3 mg total) by mouth daily.   zolpidem 10 MG tablet Commonly known as: AMBIEN Take 1 tablet (10 mg total) by mouth at bedtime.          Follow-up Information     Reubin Milan, MD Follow up.   Specialty: Internal Medicine Contact information: [address] [place] [address] [phone]                  Time spent on discharge management including discussion of hospital course, clinical condition, outpatient instructions, prescriptions, and follow up with the patient and members of the medical team: >30 minutes  -- Lynden Oxford , PA-C [facility]  08/22/2023, 8:18 AM [phone] M-F: 7am - 4pm

## 2023-08-23 ENCOUNTER — Telehealth: Payer: Self-pay

## 2023-08-23 NOTE — Transitions of Care (Post Inpatient/ED Visit) (Signed)
08/23/2023  Name: Tracy Chase MRN: [id number] DOB: 10/06/1956  Today's TOC FU Call Status: Today's TOC FU Call Status:: Successful TOC FU Call Completed TOC FU Call Complete Date: 08/23/23 Patient's Name and Date of Birth confirmed.  Transition Care Management Follow-up Telephone Call Date of Discharge: 08/22/23 Discharge Facility: [facility]) Type of Discharge: Inpatient Admission Primary Inpatient Discharge Diagnosis:: diverticulitis How have you been since you were released from the hospital?: Better Any questions or concerns?: No  Items Reviewed: Did you receive and understand the discharge instructions provided?: Yes Medications obtained,verified, and reconciled?: Yes (Medications Reviewed) Any new allergies since your discharge?: No Dietary orders reviewed?: Yes Do you have support at home?: Yes People in Home: significant other  Medications Reviewed Today: Medications Reviewed Today     Reviewed by Karena Addison, LPN (Licensed Practical Nurse) on 08/23/23 at 1011  Med List Status: <None>   Medication Order Taking? Sig Documenting Provider Last Dose Status Informant  ALPRAZolam (XANAX) 0.25 MG tablet [id number] No Take 1 tablet (0.25 mg total) by mouth 2 (two) times daily as needed for anxiety. Reubin Milan, MD Past Week Active   FLUoxetine (PROZAC) 20 MG capsule [id number] No TAKE 1 CAPSULE BY MOUTH DAILY  Patient taking differently: Take 20 mg by mouth every morning.   Reubin Milan, MD Past Week Active   pantoprazole (PROTONIX) 40 MG tablet [id number] No Take 1 tablet (40 mg total) by mouth 2 (two) times daily. Lynden Oxford R, PA-C Taking Expired 07/06/23 2359   Plecanatide (TRULANCE) 3 MG TABS [id number] No Take 1 tablet (3 mg total) by mouth daily. Celso Amy, PA-C Past Week Active   traMADol (ULTRAM) 50 MG tablet [id number] No Take 1 tablet (50 mg total) by mouth every 6 (six) hours as needed for moderate pain. Henrene Dodge, MD Past Week Active   triamterene-hydrochlorothiazide (MAXZIDE-25) 37.5-25 MG tablet [id number] No Take 1 tablet by mouth daily. Reubin Milan, MD Past Week Active   zolpidem (AMBIEN) 10 MG tablet [id number] No Take 1 tablet (10 mg total) by mouth at bedtime. Reubin Milan, MD 08/20/2023 Active             Home Care and Equipment/Supplies: Were Home Health Services Ordered?: NA Any new equipment or medical supplies ordered?: NA  Functional Questionnaire: Do you need assistance with bathing/showering or dressing?: No Do you need assistance with meal preparation?: No Do you need assistance with eating?: No Do you have difficulty maintaining continence: No Do you need assistance with getting out of bed/getting out of a chair/moving?: No Do you have difficulty managing or taking your medications?: No  Follow up appointments reviewed: PCP Follow-up appointment confirmed?: Yes Date of PCP follow-up appointment?: 08/27/23 Follow-up Provider: [facility] Follow-up appointment confirmed?: Yes Date of Specialist follow-up appointment?: 09/26/23 Follow-Up Specialty Provider:: cardio Do you need transportation to your follow-up appointment?: No Do you understand care options if your condition(s) worsen?: Yes-patient verbalized understanding    SIGNATURE Karena Addison, LPN [facility] Nurse Health Advisor Direct Dial [phone]

## 2023-08-27 ENCOUNTER — Ambulatory Visit: Payer: Medicare Other | Admitting: Internal Medicine

## 2023-08-27 ENCOUNTER — Encounter: Payer: Self-pay | Admitting: Internal Medicine

## 2023-08-27 VITALS — BP 124/76 | HR 67 | Ht 66.0 in | Wt 172.0 lb

## 2023-08-27 DIAGNOSIS — K5901 Slow transit constipation: Secondary | ICD-10-CM | POA: Insufficient documentation

## 2023-08-27 DIAGNOSIS — K661 Hemoperitoneum: Secondary | ICD-10-CM | POA: Diagnosis not present

## 2023-08-27 DIAGNOSIS — M899 Disorder of bone, unspecified: Secondary | ICD-10-CM | POA: Diagnosis not present

## 2023-08-27 MED ORDER — TRULANCE 3 MG PO TABS: 1.0000 | ORAL_TABLET | Freq: Every day | ORAL | 1 refills | Status: DC

## 2023-08-27 NOTE — Assessment & Plan Note (Signed)
Plan for repeat CT chest to evaluate in November.

## 2023-08-27 NOTE — Assessment & Plan Note (Signed)
Now on Trulance with good response 90 day Rx sent to mail order at patient request

## 2023-08-27 NOTE — Progress Notes (Signed)
Date:  08/27/2023   Name:  Tracy Chase   DOB:  02-18-1956   MRN:  [id number]   Chief Complaint: Hospitalization Follow-up Hospital follow up from [facility].  Admitted 08/21/23 - 08/22/23.  TOC call on  08/23/23.   67 y.o. female presented to [facility] this morning for scheduled colonoscopy and EGD. This was uneventful. Discussed with Dr Tobi Bastos and reviewed images as well. However, following completion, she reports developing LUQ abdominal pain and left lower chest pain. This was not present prior to the procedure. This radiated to her left shoulder as well. She underwent cardiac work up initially which was reassuring. She underwent CT Abdomen/Pelvis which was concerning for new fluid attenuation in the LUQ concerning for possible hemoperitoneum. She had follow up CTA which did not show any active extravasation. She had CBC as well which showed stable Hgb to 11.0. She has remained hemodynamically stable. She denied any previous trauma or falls in the last few days. She is not on any anticoagulation. She is known to [place] and has significant prior abdominal surgical history.      Hospital Course: Patient was admitted to the general surgery for observation out of abundance of caution. She did well and H&H remained stable without hemodynamic changes. The remainder of patient's hospital course was essentially unremarkable, and discharge planning was initiated accordingly with patient safely able to be discharged home with appropriate discharge instructions, and outpatient follow-up after all of her questions were answered to her expressed satisfaction.   Abdominal Pain Chronicity: much improved since discharge. The problem occurs rarely. The problem has been gradually improving. The patient is experiencing no pain. Associated symptoms include constipation. Pertinent negatives include no fever or headaches.  Constipation This is a chronic problem. The problem has been gradually improving since onset. The patient is  not on a high fiber diet. She Does not exercise regularly. There has Been adequate water intake. Associated symptoms include abdominal pain. Pertinent negatives include no fever. Treatments tried: on Trulance with good response.    Review of Systems  Constitutional:  Positive for unexpected weight change (with recent illness/abd pain). Negative for chills, fatigue and fever.  Respiratory:  Negative for chest tightness and shortness of breath.   Cardiovascular:  Positive for chest pain (mild discomfort since discharge).  Gastrointestinal:  Positive for abdominal pain and constipation. Negative for blood in stool.  Neurological:  Negative for dizziness, light-headedness and headaches.  Psychiatric/Behavioral:  Positive for dysphoric mood. Negative for sleep disturbance. The patient is nervous/anxious.      Lab Results  Component Value Date   NA 138 08/22/2023   K 3.3 (L) 08/22/2023   CO2 26 08/22/2023   GLUCOSE 92 08/22/2023   BUN <5 (L) 08/22/2023   CREATININE 0.75 08/22/2023   CALCIUM 8.3 (L) 08/22/2023   EGFR 79 11/06/2022   GFRNONAA >60 08/22/2023   Lab Results  Component Value Date   CHOL 219 (H) 11/06/2022   HDL 95 11/06/2022   LDLCALC 112 (H) 11/06/2022   TRIG 66 11/06/2022   CHOLHDL 2.3 11/06/2022   Lab Results  Component Value Date   TSH 1.620 11/06/2022   Lab Results  Component Value Date   HGBA1C 5.3 11/01/2021   Lab Results  Component Value Date   WBC 3.5 (L) 08/22/2023   HGB 10.6 (L) 08/22/2023   HCT 32.1 (L) 08/22/2023   MCV 91.5 08/22/2023   PLT 162 08/22/2023   Lab Results  Component Value Date   ALT 10  08/22/2023   AST 13 (L) 08/22/2023   ALKPHOS 41 08/22/2023   BILITOT 0.8 08/22/2023   No results found for: "25OHVITD2", "25OHVITD3", "VD25OH"   Patient Active Problem List   Diagnosis Date Noted   Constipation due to slow transit 08/27/2023   Acute pain of left shoulder 08/21/2023   Abnormal EKG 08/21/2023   Hemoperitoneum 08/21/2023    Recurrent incisional hernia 01/10/2023   Rib lesion 08/09/2022   Trigger index finger of right hand 11/01/2021   Atherosclerosis of aorta (HCC) 10/17/2021   S/P TKR (total knee replacement) using cement, left 02/21/2021   Mixed hyperlipidemia 09/25/2019   Colon cancer screening    Polyp of colon    Degenerative disc disease, cervical 11/18/2017   Generalized anxiety disorder 06/21/2015   History of renal cell cancer 06/21/2015   Carpal tunnel syndrome 06/21/2015   Essential (primary) hypertension 06/21/2015   Idiopathic insomnia 06/21/2015    Allergies  Allergen Reactions   Hydrocodone Itching    Past Surgical History:  Procedure Laterality Date   ABDOMINAL HYSTERECTOMY     APPENDECTOMY  2011   Lap converted to exlap appendectomy, drainage of abd abscesses   CESAREAN SECTION     COLONOSCOPY WITH PROPOFOL N/A 08/28/2019   Procedure: COLONOSCOPY WITH PROPOFOL;  Surgeon: Pasty Spillers, MD;  Location: [facility] ENDOSCOPY;  Service: Endoscopy;  Laterality: N/A;   COLONOSCOPY WITH PROPOFOL N/A 08/21/2023   Procedure: COLONOSCOPY WITH PROPOFOL;  Surgeon: [name], MD;  Location: [facility] ENDOSCOPY;  Service: Gastroenterology;  Laterality: N/A;   cryoablation for renal cell carcinoma  2012   ESOPHAGOGASTRODUODENOSCOPY (EGD) WITH PROPOFOL N/A 08/21/2023   Procedure: ESOPHAGOGASTRODUODENOSCOPY (EGD) WITH PROPOFOL;  Surgeon: [name], MD;  Location: [facility] ENDOSCOPY;  Service: Gastroenterology;  Laterality: N/A;   ETHMOIDECTOMY Left 09/06/2020   Procedure: TOTAL ETHMOIDECTOMY;  Surgeon: Geanie Logan, MD;  Location: [facility] SURGERY CNTR;  Service: ENT;  Laterality: Left;   FRONTAL SINUS EXPLORATION Left 09/06/2020   Procedure: FRONTAL SINUS EXPLORATION;  Surgeon: Geanie Logan, MD;  Location: [facility] SURGERY [facility];  Service: ENT;  Laterality: Left;   IMAGE GUIDED SINUS SURGERY N/A 09/06/2020   Procedure: IMAGE GUIDED SINUS SURGERY;  Surgeon: Geanie Logan, MD;  Location: [facility] SURGERY CNTR;   Service: ENT;  Laterality: N/A;  need stryker disk PLACE DISK ON OR CHARGE NURSE DESK 10-01 KP   INSERTION OF MESH  01/10/2023   Procedure: INSERTION OF MESH;  Surgeon: Henrene Dodge, MD;  Location: [facility] ORS;  Service: General;;   JOINT REPLACEMENT Left    partial knee replacement   LUMBAR FUSION  1994   MAXILLARY ANTROSTOMY Left 09/06/2020   Procedure: MAXILLARY ANTROSTOMY WITH TISSUE;  Surgeon: Geanie Logan, MD;  Location: [facility] SURGERY CNTR;  Service: ENT;  Laterality: Left;   MEDIAL PARTIAL KNEE REPLACEMENT Left    SPHENOIDECTOMY Left 09/06/2020   Procedure: SPHENOIDECTOMY;  Surgeon: Geanie Logan, MD;  Location: [facility] SURGERY [facility]TR;  Service: ENT;  Laterality: Left;   TONSILLECTOMY     TOTAL KNEE REVISION Left 02/21/2021   Procedure: Revision, left partial to total knee;  Surgeon: Kennedy Bucker, MD;  Location: [facility] ORS;  Service: Orthopedics;  Laterality: Left;  CHRIS GAINES TO ASSIST   VENTRAL HERNIA REPAIR  2012   open ventral hernia repair with mesh   XI ROBOTIC ASSISTED VENTRAL HERNIA N/A 01/10/2023   Procedure: XI ROBOTIC ASSISTED VENTRAL HERNIA WITH LYSIS OF ADHESIONS;  Surgeon: Henrene Dodge, MD;  Location: [facility] ORS;  Service: General;  Laterality: N/A;  Social History   Tobacco Use   Smoking status: Former    Current packs/day: 0.00    Types: Cigarettes    Quit date: 1990    Years since quitting: 34.8    Passive exposure: Past   Smokeless tobacco: Never   Tobacco comments:    was "social Smoker" over 15 yrs ago  Vaping Use   Vaping status: Never Used  Substance Use Topics   Alcohol use: Yes    Comment: 2-3 times weekly   Drug use: No     Medication list has been reviewed and updated.  Current Meds  Medication Sig   ALPRAZolam (XANAX) 0.25 MG tablet Take 1 tablet (0.25 mg total) by mouth 2 (two) times daily as needed for anxiety.   FLUoxetine (PROZAC) 20 MG capsule TAKE 1 CAPSULE BY MOUTH DAILY (Patient taking differently: Take 20 mg by mouth every  morning.)   pantoprazole (PROTONIX) 40 MG tablet Take 1 tablet (40 mg total) by mouth 2 (two) times daily. (Patient taking differently: Take 40 mg by mouth daily.)   traMADol (ULTRAM) 50 MG tablet Take 1 tablet (50 mg total) by mouth every 6 (six) hours as needed for moderate pain.   triamterene-hydrochlorothiazide (MAXZIDE-25) 37.5-25 MG tablet Take 1 tablet by mouth daily.   zolpidem (AMBIEN) 10 MG tablet Take 1 tablet (10 mg total) by mouth at bedtime.   [DISCONTINUED] Plecanatide (TRULANCE) 3 MG TABS Take 1 tablet (3 mg total) by mouth daily.       08/27/2023    1:51 PM 07/04/2023   11:32 AM 05/20/2023    1:24 PM 12/25/2022    8:40 AM  GAD 7 : Generalized Anxiety Score  Nervous, Anxious, on Edge 3 0 3 2  Control/stop worrying 3 0 3 2  Worry too much - different things 3 0 3 2  Trouble relaxing 3 0 0 2  Restless 3 0 0 2  Easily annoyed or irritable 3 1 2 2   Afraid - awful might happen 3 1 0 2  Total GAD 7 Score 21 2 11 14   Anxiety Difficulty Extremely difficult Not difficult at all Not difficult at all Not difficult at all       08/27/2023    1:51 PM 07/30/2023   10:12 AM 07/04/2023   11:32 AM  Depression screen PHQ 2/9  Decreased Interest 1 0 0  Down, Depressed, Hopeless 1 0 0  PHQ - 2 Score 2 0 0  Altered sleeping 0 0 0  Tired, decreased energy 2 0 0  Change in appetite 2 0 0  Feeling bad or failure about yourself  0 0 0  Trouble concentrating 0 0 0  Moving slowly or fidgety/restless 0 0 0  Suicidal thoughts 0 0 0  PHQ-9 Score 6 0 0  Difficult doing work/chores Not difficult at all Not difficult at all Not difficult at all    BP Readings from Last 3 Encounters:  08/27/23 124/76  08/22/23 (!) 141/69  07/04/23 122/68    Physical Exam Vitals and nursing note reviewed.  Constitutional:      General: She is not in acute distress.    Appearance: Normal appearance. She is well-developed.  HENT:     Head: Normocephalic and atraumatic.  Cardiovascular:     Rate and  Rhythm: Normal rate and regular rhythm.  Pulmonary:     Effort: Pulmonary effort is normal. No respiratory distress.     Breath sounds: No wheezing or rhonchi.  Abdominal:  General: Abdomen is flat.     Palpations: Abdomen is soft.     Tenderness: There is no abdominal tenderness.  Musculoskeletal:     Cervical back: Normal range of motion.  Lymphadenopathy:     Cervical: No cervical adenopathy.  Skin:    General: Skin is warm and dry.     Findings: No rash.  Neurological:     Mental Status: She is alert and oriented to person, place, and time.  Psychiatric:        Mood and Affect: Mood normal.        Behavior: Behavior normal.     Wt Readings from Last 3 Encounters:  08/27/23 172 lb (78 kg)  08/21/23 176 lb 3.2 oz (79.9 kg)  07/04/23 174 lb (78.9 kg)    BP 124/76   Pulse 67   Ht 5\' 6"  (1.676 m)   Wt 172 lb (78 kg)   SpO2 95%   BMI 27.76 kg/m   Assessment and Plan:  Problem List Items Addressed This Visit       Unprioritized   Constipation due to slow transit    Now on Trulance with good response 90 day Rx sent to mail order at patient request      Relevant Medications   Plecanatide (TRULANCE) 3 MG TABS   Hemoperitoneum - Primary   Rib lesion (Chronic)    Plan for repeat CT chest to evaluate in November.       No follow-ups on file.    Reubin Milan, MD [facility] [place]

## 2023-08-28 ENCOUNTER — Other Ambulatory Visit: Payer: Self-pay | Admitting: Surgery

## 2023-09-04 ENCOUNTER — Ambulatory Visit: Payer: Medicare Other | Admitting: Student

## 2023-09-20 ENCOUNTER — Other Ambulatory Visit: Payer: Self-pay | Admitting: Internal Medicine

## 2023-09-23 ENCOUNTER — Ambulatory Visit: Payer: Medicare Other | Admitting: Physician Assistant

## 2023-09-23 NOTE — Telephone Encounter (Signed)
Requested Prescriptions  Pending Prescriptions Disp Refills   FLUoxetine (PROZAC) 20 MG capsule [Pharmacy Med Name: FLUoxetine HCl 20 MG Oral Capsule] 90 capsule 3    Sig: TAKE 1 CAPSULE BY MOUTH DAILY     Psychiatry:  Antidepressants - SSRI Passed - 09/20/2023 10:14 PM      Passed - Valid encounter within last 6 months    Recent Outpatient Visits           3 weeks ago Hemoperitoneum   [place] [facility] at [facility], Nyoka Cowden, MD   2 months ago Urgency of urination   [place] [facility] at [facility], MD   3 months ago Colicky RUQ abdominal pain   [place] [facility] at [facility], Nyoka Cowden, MD   4 months ago Essential (primary) hypertension   [place] [facility] at [facility], Nyoka Cowden, MD   9 months ago Essential (primary) hypertension   [facility] at [facility], Nyoka Cowden, MD       Future Appointments             In 3 days [place], Gavin Pound, NP [place] [place]   In 2 months [name], Nyoka Cowden, MD [facility] at [facility]

## 2023-09-24 ENCOUNTER — Ambulatory Visit: Payer: Medicare Other | Admitting: Physician Assistant

## 2023-09-25 NOTE — Progress Notes (Unsigned)
Cardiology Clinic Note   Date: 09/26/2023 ID: Chase, Tracy 1955/12/29, MRN [id number]  Primary Cardiologist:  Debbe Odea, MD  Patient Profile    Tracy Chase is a 67 y.o. female who presents to the clinic today for hospital follow up.     Past medical history significant for: Chest pain.  Echo 10/25/2020: EF 60-65%. No RWMA. Mild LVH. Grade I DD. Mildly reduced RV function. Trivial MR. Mild calcification/thickening of the aortic valve without stenosis. Coronary CTA 11/03/2020: Coronary calcium score of 0 with no evidence of CAD. Hypertension.  Hyperlipidemia.   In summary, was first evaluated by Dr. Azucena Cecil on 10/14/2020 for chest pain at the request of Dr. [name].  Patient reported a 32-month history of worsening chest pain described as central tightness not related to exertion and sometimes related to stress.  She reported a family history of MI in both parents, mom age 56 when she had her first MI.  Noted increase stress at home secondary to her husband having an MI.  Patient underwent echo and coronary CTA (details above).     History of Present Illness    Tracy Chase is followed by Dr. Azucena Cecil for the above outlined history.  Patient was evaluated for abnormal EKG during hospital admission for colonoscopy on 08/21/2023.  Patient underwent uneventful colonoscopy demonstrating diverticulitis with no intervention performed.  Upon awakening from anesthesia she complained of left shoulder pain which was new for her.  Of note she was positioned in the left lateral decubitus position during the procedure.  She denied chest pain, shortness of breath, palpitations, or nausea.  She also reported some left-sided abdominal pain.  EKG showed sinus bradycardia with poor R wave progression and anteroseptal T wave inversions.  This felt pain most likely musculoskeletal but could also be referred pain from intra-abdominal process.  Nonspecific EKG changes could be attributed to  difference in lead placement or electrolyte abnormalities.  WBC 2.8, hemoglobin 11, potassium 3.2, calcium 8.3.  Troponin negative x 2.  On review of labs and in the setting of improved left shoulder pain and no chest pain no further testing/intervention recommended.  Patient instructed to follow-up with cardiology as an outpatient.   Discussed the use of AI scribe software for clinical note transcription with the patient, who gave verbal consent to proceed.  The patient presents for hospital follow-up. She has had no further shoulder pain. She reports episodes of central chest tightness that she relates to anxiety. Tightness occurs during times of high stress. Episodes are brief lasting minutes and resolving with deep breathing. They have been occurring for around 6 months - around the time her dad moved in with her.  She has been under a lot of stress lately, as her 15 year old dad recently moved in with her. He is non-verbal and communicates through writing. Her husband is hard of hearing so she feels like she is either yelling or completely silent.  The patient also reports palpitations, described as a fast and hard heart rate that occurs randomly. These episodes last for a few minutes and can be controlled with deep breathing exercises. The patient denies any associated shortness of breath or chest pain during these episodes. The patient walks approximately two and a half miles daily and does not experience any chest tightness or shortness of breath during these walks. Patient reports 2 episodes of "blackouts" that lasted a few seconds and resolved on its own. She felt like she could not hear or see for  those few seconds but she did not lose consciousness. She denies palpitations, chest pain, lightheadedness or dizziness prior to episode but describes feeling shaky afterwards. These episodes were not associated with any particular stressors and occurred randomly. She does not recall if she had poor oral  intake around the time of the episodes but does admit to poor oral intake in general.          ROS: All other systems reviewed and are otherwise negative except as noted in History of Present Illness.  Studies Reviewed    EKG Interpretation Date/Time:  Thursday September 26 2023 15:45:04 EST Ventricular Rate:  56 PR Interval:  160 QRS Duration:  76 QT Interval:  436 QTC Calculation: 420 R Axis:   3  Text Interpretation: Sinus bradycardia Nonspecific ST and T wave abnormality When compared with ECG of 08-Jan-2023 10:05, No significant change was found Confirmed by Carlos Levering [phone]) on 09/26/2023 3:53:53 PM            Physical Exam    VS:  BP 122/62 (BP Location: Left Arm, Patient Position: Sitting, Cuff Size: Normal)   Pulse (!) 56   Ht 5\' 6"  (1.676 m)   Wt 170 lb 3.2 oz (77.2 kg)   SpO2 98%   BMI 27.47 kg/m  , BMI Body mass index is 27.47 kg/m.  GEN: Well nourished, well developed, in no acute distress. Neck: No JVD or carotid bruits. Cardiac:  RRR. No murmurs. No rubs or gallops.   Respiratory:  Respirations regular and unlabored. Clear to auscultation without rales, wheezing or rhonchi. GI: Soft, nontender, nondistended. Extremities: Radials/DP/PT 2+ and equal bilaterally. No clubbing or cyanosis. No edema.  Skin: Warm and dry, no rash. Neuro: Strength intact.  Assessment & Plan      Chest Pressure/Anxiety Intermittent episodes of chest pressure for the past six months, likely related to anxiety. No associated shortness of breath or exertional symptoms. Recent ER visit with negative cardiac workup. Previous coronary CTA in 2021 with zero calcium score. She walks 2.5 miles daily with no chest discomfort or shortness of breath.  -Continue current management with deep breathing exercises. -Refer to primary care for potential adjustment of Prozac or addition of anxiolytic medication. -Given atypical nature and associated with stress/anxiety and no symptoms  with 2.5 mile walks, ischemic workup not indicated at this time.   Questionable presyncopal episode Two episodes of brief "blackout" with loss of hearing and vision, followed by feeling shaky but no loss of consciousness. Episodes lasted for a few seconds. No prodromal symptoms.  No associated palpitations, dizziness or lightheadedness. She is unsure if she had poor oral intake surrounding the events but reports she normally does not eat well.  -Refer to primary care for further evaluation and potential workup.  Palpitations Intermittent episodes of heart racing/pounding, regular in rhythm, lasting a few minutes, occurring several times a week. Episodes are self-limited with deep breathing exercises. No associated shortness of breath. Baseline heart rate is low (56 bpm).  -Continue current management with deep breathing exercises. -Consider heart monitor if episodes become more sustained or frequent.        Disposition: Return in 1 year or sooner as needed.          Signed, Etta Grandchild. Tashunda Vandezande, DNP, NP-C

## 2023-09-26 ENCOUNTER — Other Ambulatory Visit: Payer: Medicare Other

## 2023-09-26 ENCOUNTER — Encounter: Payer: Self-pay | Admitting: Student

## 2023-09-26 ENCOUNTER — Ambulatory Visit: Payer: Medicare Other | Attending: Student | Admitting: Student

## 2023-09-26 VITALS — BP 122/62 | HR 56 | Ht 66.0 in | Wt 170.2 lb

## 2023-09-26 DIAGNOSIS — R002 Palpitations: Secondary | ICD-10-CM | POA: Diagnosis not present

## 2023-09-26 DIAGNOSIS — R0789 Other chest pain: Secondary | ICD-10-CM

## 2023-09-26 DIAGNOSIS — F419 Anxiety disorder, unspecified: Secondary | ICD-10-CM

## 2023-09-26 DIAGNOSIS — R55 Syncope and collapse: Secondary | ICD-10-CM | POA: Diagnosis not present

## 2023-09-26 NOTE — Patient Instructions (Signed)
Medication Instructions:  No changes *If you need a refill on your cardiac medications before your next appointment, please call your pharmacy*   Lab Work: None ordered If you have labs (blood work) drawn today and your tests are completely normal, you will receive your results only by: MyChart Message (if you have MyChart) OR A paper copy in the mail If you have any lab test that is abnormal or we need to change your treatment, we will call you to review the results.   Testing/Procedures: None ordered   Follow-Up: At [facility], you and your health needs are our priority.  As part of our continuing mission to provide you with exceptional heart care, we have created designated Provider Care Teams.  These Care Teams include your primary Cardiologist (physician) and Advanced Practice Providers (APPs -  Physician Assistants and Nurse Practitioners) who all work together to provide you with the care you need, when you need it.  We recommend signing up for the patient portal called "MyChart".  Sign up information is provided on this After Visit Summary.  MyChart is used to connect with patients for Virtual Visits (Telemedicine).  Patients are able to view lab/test results, encounter notes, upcoming appointments, etc.  Non-urgent messages can be sent to your provider as well.   To learn more about what you can do with MyChart, go to [email or web address].    Your next appointment:   12 month(s)  Provider:   You may see Debbe Odea, MD or one of the following Advanced Practice Providers on your designated Care Team:   Carlos Levering, NP

## 2023-10-01 ENCOUNTER — Ambulatory Visit: Payer: Medicare Other | Admitting: Thoracic Surgery (Cardiothoracic Vascular Surgery)

## 2023-10-01 ENCOUNTER — Other Ambulatory Visit: Payer: Medicare Other

## 2023-10-08 ENCOUNTER — Inpatient Hospital Stay
Admission: RE | Admit: 2023-10-08 | Discharge: 2023-10-08 | Disposition: A | Discharge status: Home / Self Care | Payer: Medicare Other | Source: Ambulatory Visit | Attending: Thoracic Surgery (Cardiothoracic Vascular Surgery) | Admitting: Thoracic Surgery (Cardiothoracic Vascular Surgery)

## 2023-10-08 ENCOUNTER — Encounter: Payer: Self-pay | Admitting: Thoracic Surgery (Cardiothoracic Vascular Surgery)

## 2023-10-08 ENCOUNTER — Other Ambulatory Visit: Payer: Medicare Other

## 2023-10-08 ENCOUNTER — Ambulatory Visit: Payer: Medicare Other | Admitting: Thoracic Surgery (Cardiothoracic Vascular Surgery)

## 2023-10-08 VITALS — HR 59 | Resp 20 | Ht 66.0 in | Wt 170.0 lb

## 2023-10-08 DIAGNOSIS — Z09 Encounter for follow-up examination after completed treatment for conditions other than malignant neoplasm: Secondary | ICD-10-CM | POA: Diagnosis not present

## 2023-10-08 DIAGNOSIS — M899 Disorder of bone, unspecified: Secondary | ICD-10-CM

## 2023-10-08 DIAGNOSIS — M9988 Other biomechanical lesions of rib cage: Secondary | ICD-10-CM | POA: Diagnosis not present

## 2023-10-08 NOTE — Progress Notes (Signed)
[address]       [place] [address]             [phone]     HPI: Tracy Chase returns for follow-up of a right fourth rib mass.  Tracy Chase is a 67 year old woman with a history of renal cell carcinoma, arthritis, reflux, anxiety, depression, prior lumbar fusion and a right fourth rib mass.  Presented with intermittent stabbing pain in the right upper back and right anterior chest.  She was found to have a 3 x 2.1 x 2 cm expansile lesion of the right fourth rib.  MR was indeterminate but probably benign so we discussed the options of resection versus radiographic observation.  She opted for radiographic observation.  I last saw her in May 2024.  The lesion was stable.  In the interim since her last visit she has been doing well.  She complains of some pain in the mid thoracic spine.  No paresthesias.  Past Medical History:  Diagnosis Date   Anemia    h/o   Aortic atherosclerosis (HCC)    Arthritis    Cancer (HCC)    RENAL CELL   Depression    Diverticulitis of small intestine with perforation and abscess 05/31/2023   GERD (gastroesophageal reflux disease)    OCC NO MEDS   Hypertension    Incisional hernia    Insomnia    Rib lesion    3 x 2.1 x 2 cm expansile right fourth rib posteriorly   Sepsis (HCC)     Current Outpatient Medications  Medication Sig Dispense Refill   ALPRAZolam (XANAX) 0.25 MG tablet Take 1 tablet (0.25 mg total) by mouth 2 (two) times daily as needed for anxiety. 20 tablet 0   FLUoxetine (PROZAC) 20 MG capsule TAKE 1 CAPSULE BY MOUTH DAILY 100 capsule 1   Plecanatide (TRULANCE) 3 MG TABS Take 1 tablet (3 mg total) by mouth daily. 90 tablet 1   traMADol (ULTRAM) 50 MG tablet Take 1 tablet (50 mg total) by mouth every 6 (six) hours as needed for moderate pain. 25 tablet 0   triamterene-hydrochlorothiazide (MAXZIDE-25) 37.5-25 MG tablet Take 1 tablet by mouth daily. 100 tablet 3   zolpidem (AMBIEN) 10 MG tablet Take 1 tablet  (10 mg total) by mouth at bedtime. 90 tablet 1   pantoprazole (PROTONIX) 40 MG tablet Take 1 tablet (40 mg total) by mouth 2 (two) times daily. (Patient taking differently: Take 40 mg by mouth daily.) 60 tablet 0   No current facility-administered medications for this visit.    Physical Exam Pulse (!) 59   Resp 20   Ht 5\' 6"  (1.676 m)   Wt 170 lb (77.1 kg)   SpO2 100% Comment: RA  BMI 27.16 kg/m  67 year old woman in no acute distress Alert and oriented x 3 with no focal deficits Lungs clear Cardiac regular rate and rhythm No palpable paraspinous abnormality.  Diagnostic Tests: CT CHEST WITHOUT CONTRAST   TECHNIQUE: Multidetector CT imaging of the chest was performed following the standard protocol without IV contrast.   RADIATION DOSE REDUCTION: This exam was performed according to the departmental dose-optimization program which includes automated exposure control, adjustment of the mA and/or kV according to patient size and/or use of iterative reconstruction technique.   COMPARISON:  Chest CT 03/26/2023 and 07/31/2022. Thoracic MRI 08/06/2022. Abdominal CT 08/21/2023 and 06/28/2023.   FINDINGS: Cardiovascular: Minimal aortic atherosclerosis. No acute vascular findings on noncontrast imaging. The heart  size is normal. There is no pericardial effusion.   Mediastinum/Nodes: There are no enlarged mediastinal, hilar or axillary lymph nodes.Hilar assessment is limited by the lack of intravenous contrast, although the hilar contours appear unchanged. The thyroid gland, trachea and esophagus demonstrate no significant findings.   Lungs/Pleura: No pleural effusion or pneumothorax. There is stable chronic paramediastinal scarring medially in the right lower lobe. The patchy ground-glass opacities seen in the right lung on the most recent study have resolved. No suspicious pulmonary nodule or confluent airspace disease.   Upper abdomen: No acute findings are seen in the  visualized upper abdomen. 2.2 cm exophytic lesion from the interpolar region of the left kidney measures higher than water density, although corresponds with a cyst on prior abdominal imaging with contrast, grossly unchanged. No specific follow-up imaging recommended.   Musculoskeletal/Chest wall: Unchanged chronic expansile lesion fourth rib posteriorly near the costovertebral junction, measuring 3.2 x 2.4 cm on image 30/2. This has sclerotic margins and central intermediate density. No aggressive osseous lesions or acute fractures. Grossly stable postsurgical changes from previous thoracolumbar fusion, incompletely visualized.   Unless specific follow-up recommendations are mentioned in the findings or impression sections, no imaging follow-up of any mentioned incidental findings is recommended.   IMPRESSION: 1. Unchanged chronic expansile lesion in the fourth rib posteriorly near the costovertebral junction since baseline CT of 07/31/2022; also grossly stable from chest radiographs 10/18/2015. This has sclerotic margins and central intermediate density, most consistent with a benign fibro-osseous lesion. 2. No evidence of metastatic disease in the chest. 3. Interval resolution of patchy ground-glass opacities seen in the right lung on the most recent study. 4.  Aortic Atherosclerosis (ICD10-I70.0).     Electronically Signed   By: Carey Bullocks M.D.   On: 10/08/2023 12:07 I personally reviewed the CT images.  No change in the 3.2 x 2.4 cm expansile posterior fourth rib mass.  Unchanged from CT in September 2023.  Consistent with a benign lesion.  Impression: Tracy Chase is a 67 year old woman with a history of renal cell carcinoma, arthritis, reflux, anxiety, depression, prior lumbar fusion and a right fourth rib mass.  Right fourth rib mass-no change over the past year.  Visible on a plain chest x-ray from 2016 and roughly similar in size and appearance when accounting for  different radiographic techniques.  Consistent with benign lesion.  She has some back pain but it is a lower down and central.  She is not really having paresthesias.  There is no indication for continued radiographic follow-up.   Plan: I will be happy to see her back anytime in the future if I can be of any further assistance with her care  I spent 20 minutes in review of records, images, and in consultation with Mrs. Dip today Loreli Slot, MD [facility] [phone]

## 2023-10-31 ENCOUNTER — Other Ambulatory Visit: Payer: Self-pay | Admitting: Internal Medicine

## 2023-10-31 DIAGNOSIS — F411 Generalized anxiety disorder: Secondary | ICD-10-CM

## 2023-10-31 NOTE — Telephone Encounter (Signed)
Please review.  KP

## 2023-11-01 MED ORDER — ALPRAZOLAM 0.25 MG PO TABS
0.2500 mg | ORAL_TABLET | Freq: Two times a day (BID) | ORAL | 0 refills | Status: DC | PRN
Start: 2023-11-01 — End: 2024-01-16

## 2023-11-04 ENCOUNTER — Telehealth: Payer: Medicare Other | Admitting: Family Medicine

## 2023-11-04 DIAGNOSIS — R3989 Other symptoms and signs involving the genitourinary system: Secondary | ICD-10-CM | POA: Diagnosis not present

## 2023-11-04 MED ORDER — CEPHALEXIN 500 MG PO CAPS
500.0000 mg | ORAL_CAPSULE | Freq: Two times a day (BID) | ORAL | 0 refills | Status: AC
Start: 2023-11-04 — End: 2023-11-11

## 2023-11-04 NOTE — Progress Notes (Signed)

## 2023-11-14 ENCOUNTER — Ambulatory Visit (INDEPENDENT_AMBULATORY_CARE_PROVIDER_SITE_OTHER): Payer: Medicare Other | Admitting: Internal Medicine

## 2023-11-14 ENCOUNTER — Encounter: Payer: Self-pay | Admitting: Internal Medicine

## 2023-11-14 VITALS — BP 124/76 | HR 52 | Ht 66.0 in | Wt 171.0 lb

## 2023-11-14 DIAGNOSIS — M899 Disorder of bone, unspecified: Secondary | ICD-10-CM

## 2023-11-14 DIAGNOSIS — F411 Generalized anxiety disorder: Secondary | ICD-10-CM | POA: Diagnosis not present

## 2023-11-14 DIAGNOSIS — Z0000 Encounter for general adult medical examination without abnormal findings: Secondary | ICD-10-CM | POA: Diagnosis not present

## 2023-11-14 DIAGNOSIS — K449 Diaphragmatic hernia without obstruction or gangrene: Secondary | ICD-10-CM | POA: Diagnosis not present

## 2023-11-14 DIAGNOSIS — Z1231 Encounter for screening mammogram for malignant neoplasm of breast: Secondary | ICD-10-CM | POA: Diagnosis not present

## 2023-11-14 DIAGNOSIS — Z85528 Personal history of other malignant neoplasm of kidney: Secondary | ICD-10-CM

## 2023-11-14 DIAGNOSIS — E782 Mixed hyperlipidemia: Secondary | ICD-10-CM

## 2023-11-14 DIAGNOSIS — I10 Essential (primary) hypertension: Secondary | ICD-10-CM

## 2023-11-14 DIAGNOSIS — F5101 Primary insomnia: Secondary | ICD-10-CM | POA: Diagnosis not present

## 2023-11-14 MED ORDER — PANTOPRAZOLE SODIUM 40 MG PO TBEC: 40.0000 mg | DELAYED_RELEASE_TABLET | Freq: Every day | ORAL | 1 refills | Status: DC

## 2023-11-14 MED ORDER — ZOLPIDEM TARTRATE 10 MG PO TABS
10.0000 mg | ORAL_TABLET | Freq: Every day | ORAL | 1 refills | Status: DC
Start: 2024-01-10 — End: 2024-07-08

## 2023-11-14 NOTE — Assessment & Plan Note (Signed)
 Controlled BP with normal exam. Current regimen is Maxzide. Will continue same medications; encourage continued reduced sodium diet.

## 2023-11-14 NOTE — Assessment & Plan Note (Signed)
 Repeat CT chest 09/2023 1. Unchanged chronic expansile lesion in the fourth rib posteriorly near the costovertebral junction since baseline CT of 07/31/2022; also grossly stable from chest radiographs 10/18/2015. This has sclerotic margins and central intermediate density, most consistent with a benign fibro-osseous lesion.

## 2023-11-14 NOTE — Assessment & Plan Note (Signed)
 Managed with diet alone at this time.

## 2023-11-14 NOTE — Patient Instructions (Signed)
 Call [facility] to schedule your mammogram at [phone].

## 2023-11-14 NOTE — Progress Notes (Signed)
 Date:  11/14/2023   Name:  Tracy Chase   DOB:  04/14/1956   MRN:  [id number]   Chief Complaint: Annual Exam Tracy Chase is a 68 y.o. female who presents today for her Complete Annual Exam. She feels well. She reports exercising. She reports she is sleeping fairly well. Breast complaints - none.  Mammogram: 12/2022 DEXA: 09/2019 Normal Colonoscopy: 08/2023 repeat 5 yrs  Health Maintenance Due  Topic Date Due   DTaP/Tdap/Td (2 - Td or Tdap) 04/29/2022   COVID-19 Vaccine (5 - 2024-25 season) 07/14/2023    Immunization History  Administered Date(s) Administered   Fluad Quad(high Dose 65+) 09/02/2019, 07/27/2022   Influenza Inj Mdck Quad Pf 08/18/2018   Influenza,inj,Quad PF,6+ Mos 10/02/2016, 09/03/2017   Influenza-Unspecified 08/26/2020, 08/12/2021, 07/20/2023   PFIZER(Purple Top)SARS-COV-2 Vaccination 02/08/2020, 03/01/2020, 09/30/2020   Pfizer Covid-19 Vaccine Bivalent Booster 69yrs & up 05/16/2021   Pneumococcal Conjugate-13 09/23/2019   Pneumococcal Polysaccharide-23 10/12/2020   Tdap 04/29/2012   Zoster Recombinant(Shingrix ) 08/13/2021, 11/20/2021     Hypertension This is a chronic problem. The problem is controlled. Associated symptoms include anxiety. Pertinent negatives include no chest pain, headaches, palpitations or shortness of breath. Past treatments include diuretics. There is no history of kidney disease, CAD/MI or CVA.  Insomnia Primary symptoms: no sleep disturbance.   The problem occurs nightly.  Anxiety Presents for follow-up visit. Symptoms include insomnia. Patient reports no chest pain, dizziness, nervous/anxious behavior, palpitations or shortness of breath. Symptoms occur most days.   Compliance with medications is 76-100%.    Review of Systems  Constitutional:  Negative for chills, fatigue and fever.  HENT:  Negative for congestion, hearing loss, tinnitus, trouble swallowing and voice change.   Eyes:  Negative for visual disturbance.   Respiratory:  Negative for cough, chest tightness, shortness of breath and wheezing.   Cardiovascular:  Negative for chest pain, palpitations and leg swelling.  Gastrointestinal:  Negative for abdominal pain, constipation, diarrhea and vomiting.  Endocrine: Negative for polydipsia and polyuria.  Genitourinary:  Negative for dysuria, frequency, genital sores, vaginal bleeding and vaginal discharge.  Musculoskeletal:  Negative for arthralgias, gait problem and joint swelling.  Skin:  Negative for color change and rash.  Neurological:  Negative for dizziness, tremors, light-headedness and headaches.  Hematological:  Negative for adenopathy. Does not bruise/bleed easily.  Psychiatric/Behavioral:  Negative for dysphoric mood and sleep disturbance. The patient has insomnia. The patient is not nervous/anxious.      Lab Results  Component Value Date   NA 138 08/22/2023   K 3.3 (L) 08/22/2023   CO2 26 08/22/2023   GLUCOSE 92 08/22/2023   BUN <5 (L) 08/22/2023   CREATININE 0.75 08/22/2023   CALCIUM  8.3 (L) 08/22/2023   EGFR 79 11/06/2022   GFRNONAA >60 08/22/2023   Lab Results  Component Value Date   CHOL 219 (H) 11/06/2022   HDL 95 11/06/2022   LDLCALC 112 (H) 11/06/2022   TRIG 66 11/06/2022   CHOLHDL 2.3 11/06/2022   Lab Results  Component Value Date   TSH 1.620 11/06/2022   Lab Results  Component Value Date   HGBA1C 5.3 11/01/2021   Lab Results  Component Value Date   WBC 3.5 (L) 08/22/2023   HGB 10.6 (L) 08/22/2023   HCT 32.1 (L) 08/22/2023   MCV 91.5 08/22/2023   PLT 162 08/22/2023   Lab Results  Component Value Date   ALT 10 08/22/2023   AST 13 (L) 08/22/2023   ALKPHOS 41 08/22/2023  BILITOT 0.8 08/22/2023   No results found for: MARIEN BOLLS, VD25OH   Patient Active Problem List   Diagnosis Date Noted   Hiatal hernia 11/14/2023   Constipation due to slow transit 08/27/2023   Acute pain of left shoulder 08/21/2023   Abnormal EKG 08/21/2023    Hemoperitoneum 08/21/2023   Recurrent incisional hernia 01/10/2023   Rib lesion 08/09/2022   Trigger index finger of right hand 11/01/2021   Atherosclerosis of aorta (HCC) 10/17/2021   S/P TKR (total knee replacement) using cement, left 02/21/2021   Mixed hyperlipidemia 09/25/2019   Colon cancer screening    Polyp of colon    Degenerative disc disease, cervical 11/18/2017   Generalized anxiety disorder 06/21/2015   History of renal cell cancer 06/21/2015   Carpal tunnel syndrome 06/21/2015   Essential (primary) hypertension 06/21/2015   Idiopathic insomnia 06/21/2015    Allergies  Allergen Reactions   Hydrocodone  Itching    Past Surgical History:  Procedure Laterality Date   ABDOMINAL HYSTERECTOMY     APPENDECTOMY  2011   Lap converted to exlap appendectomy, drainage of abd abscesses   CESAREAN SECTION     COLONOSCOPY WITH PROPOFOL  N/A 08/28/2019   Procedure: COLONOSCOPY WITH PROPOFOL ;  Surgeon: Janalyn Keene NOVAK, MD;  Location: [facility] ENDOSCOPY;  Service: Endoscopy;  Laterality: N/A;   COLONOSCOPY WITH PROPOFOL  N/A 08/21/2023   Procedure: COLONOSCOPY WITH PROPOFOL ;  Surgeon: Therisa Bi, MD;  Location: [facility] ENDOSCOPY;  Service: Gastroenterology;  Laterality: N/A;   cryoablation for renal cell carcinoma  2012   ESOPHAGOGASTRODUODENOSCOPY (EGD) WITH PROPOFOL  N/A 08/21/2023   Procedure: ESOPHAGOGASTRODUODENOSCOPY (EGD) WITH PROPOFOL ;  Surgeon: Therisa Bi, MD;  Location: [facility] ENDOSCOPY;  Service: Gastroenterology;  Laterality: N/A;   ETHMOIDECTOMY Left 09/06/2020   Procedure: TOTAL ETHMOIDECTOMY;  Surgeon: Blair Mt, MD;  Location: [facility];  Service: ENT;  Laterality: Left;   FRONTAL SINUS EXPLORATION Left 09/06/2020   Procedure: FRONTAL SINUS EXPLORATION;  Surgeon: Blair Mt, MD;  Location: [facility];  Service: ENT;  Laterality: Left;   IMAGE GUIDED SINUS SURGERY N/A 09/06/2020   Procedure: IMAGE GUIDED SINUS SURGERY;  Surgeon: Blair Mt, MD;   Location: [facility];  Service: ENT;  Laterality: N/A;  need [employer] disk PLACE DISK ON OR CHARGE NURSE DESK 10-01 KP   INSERTION OF MESH  01/10/2023   Procedure: INSERTION OF MESH;  Surgeon: Desiderio Schanz, MD;  Location: [facility] ORS;  Service: General;;   JOINT REPLACEMENT Left    partial knee replacement   LUMBAR FUSION  1994   MAXILLARY ANTROSTOMY Left 09/06/2020   Procedure: MAXILLARY ANTROSTOMY WITH TISSUE;  Surgeon: Blair Mt, MD;  Location: [facility];  Service: ENT;  Laterality: Left;   MEDIAL PARTIAL KNEE REPLACEMENT Left    SPHENOIDECTOMY Left 09/06/2020   Procedure: SPHENOIDECTOMY;  Surgeon: Blair Mt, MD;  Location: [facility];  Service: ENT;  Laterality: Left;   TONSILLECTOMY     TOTAL KNEE REVISION Left 02/21/2021   Procedure: Revision, left partial to total knee;  Surgeon: Kathlynn Sharper, MD;  Location: [facility] ORS;  Service: Orthopedics;  Laterality: Left;  CHRIS GAINES TO ASSIST   VENTRAL HERNIA REPAIR  2012   open ventral hernia repair with mesh   XI ROBOTIC ASSISTED VENTRAL HERNIA N/A 01/10/2023   Procedure: XI ROBOTIC ASSISTED VENTRAL HERNIA WITH LYSIS OF ADHESIONS;  Surgeon: Desiderio Schanz, MD;  Location: [facility] ORS;  Service: General;  Laterality: N/A;    Social History   Tobacco Use   Smoking  status: Former    Current packs/day: 0.00    Types: Cigarettes    Quit date: 1990    Years since quitting: 35.0    Passive exposure: Past   Smokeless tobacco: Never   Tobacco comments:    was social Smoker over 15 yrs ago  Vaping Use   Vaping status: Never Used  Substance Use Topics   Alcohol use: Yes    Comment: 2-3 times weekly   Drug use: No     Medication list has been reviewed and updated.  Current Meds  Medication Sig   ALPRAZolam  (XANAX ) 0.25 MG tablet Take 1 tablet (0.25 mg total) by mouth 2 (two) times daily as needed for anxiety.   FLUoxetine  (PROZAC ) 20 MG capsule TAKE 1 CAPSULE BY MOUTH DAILY   Plecanatide  (TRULANCE ) 3 MG  TABS Take 1 tablet (3 mg total) by mouth daily.   traMADol  (ULTRAM ) 50 MG tablet Take 1 tablet (50 mg total) by mouth every 6 (six) hours as needed for moderate pain.   triamterene -hydrochlorothiazide  (MAXZIDE -25) 37.5-25 MG tablet Take 1 tablet by mouth daily.   [DISCONTINUED] gabapentin  (NEURONTIN ) 300 MG capsule Take 1 capsule by mouth 3 (three) times daily.   [DISCONTINUED] pantoprazole  (PROTONIX ) 40 MG tablet Take 1 tablet (40 mg total) by mouth 2 (two) times daily. (Patient taking differently: Take 40 mg by mouth daily.)   [DISCONTINUED] zolpidem  (AMBIEN ) 10 MG tablet Take 1 tablet (10 mg total) by mouth at bedtime.       08/27/2023    1:51 PM 07/04/2023   11:32 AM 05/20/2023    1:24 PM 12/25/2022    8:40 AM  GAD 7 : Generalized Anxiety Score  Nervous, Anxious, on Edge 3 0 3 2  Control/stop worrying 3 0 3 2  Worry too much - different things 3 0 3 2  Trouble relaxing 3 0 0 2  Restless 3 0 0 2  Easily annoyed or irritable 3 1 2 2   Afraid - awful might happen 3 1 0 2  Total GAD 7 Score 21 2 11 14   Anxiety Difficulty Extremely difficult Not difficult at all Not difficult at all Not difficult at all       08/27/2023    1:51 PM 07/30/2023   10:12 AM 07/04/2023   11:32 AM  Depression screen PHQ 2/9  Decreased Interest 1 0 0  Down, Depressed, Hopeless 1 0 0  PHQ - 2 Score 2 0 0  Altered sleeping 0 0 0  Tired, decreased energy 2 0 0  Change in appetite 2 0 0  Feeling bad or failure about yourself  0 0 0  Trouble concentrating 0 0 0  Moving slowly or fidgety/restless 0 0 0  Suicidal thoughts 0 0 0  PHQ-9 Score 6 0 0  Difficult doing work/chores Not difficult at all Not difficult at all Not difficult at all    BP Readings from Last 3 Encounters:  11/14/23 124/76  09/26/23 122/62  08/27/23 124/76    Physical Exam Vitals and nursing note reviewed.  Constitutional:      General: She is not in acute distress.    Appearance: She is well-developed.  HENT:     Head:  Normocephalic and atraumatic.     Right Ear: Tympanic membrane and ear canal normal.     Left Ear: Tympanic membrane and ear canal normal.     Nose:     Right Sinus: No maxillary sinus tenderness.     Left Sinus: No  maxillary sinus tenderness.  Eyes:     General: No scleral icterus.       Right eye: No discharge.        Left eye: No discharge.     Conjunctiva/sclera: Conjunctivae normal.  Neck:     Thyroid : No thyromegaly.     Vascular: No carotid bruit.  Cardiovascular:     Rate and Rhythm: Normal rate and regular rhythm.     Pulses: Normal pulses.     Heart sounds: Normal heart sounds.  Pulmonary:     Effort: Pulmonary effort is normal. No respiratory distress.     Breath sounds: No wheezing.  Chest:  Breasts:    Right: No mass, nipple discharge, skin change or tenderness.     Left: No mass, nipple discharge, skin change or tenderness.  Abdominal:     General: Bowel sounds are normal.     Palpations: Abdomen is soft.     Tenderness: There is no abdominal tenderness.  Musculoskeletal:     Cervical back: Normal range of motion. No erythema.     Right lower leg: No edema.     Left lower leg: No edema.  Lymphadenopathy:     Cervical: No cervical adenopathy.  Skin:    General: Skin is warm and dry.     Capillary Refill: Capillary refill takes less than 2 seconds.     Findings: No rash.  Neurological:     General: No focal deficit present.     Mental Status: She is alert and oriented to person, place, and time.     Cranial Nerves: No cranial nerve deficit.     Sensory: No sensory deficit.     Deep Tendon Reflexes: Reflexes are normal and symmetric.  Psychiatric:        Attention and Perception: Attention normal.        Mood and Affect: Mood normal.     Wt Readings from Last 3 Encounters:  11/14/23 171 lb (77.6 kg)  10/08/23 170 lb (77.1 kg)  09/26/23 170 lb 3.2 oz (77.2 kg)    BP 124/76   Pulse (!) 52   Ht 5' 6 (1.676 m)   Wt 171 lb (77.6 kg)   SpO2 100%    BMI 27.60 kg/m   Assessment and Plan:  Problem List Items Addressed This Visit       Unprioritized   Generalized anxiety disorder (Chronic)   Clinically stable on Prozac  daily with good response, No SI or HI reported. Recently added low dose Xanax  which she takes rarely. No change in management at this time.       Relevant Orders   TSH   History of renal cell cancer   Recent CT abdomen shows scarring without evidence of recurrance      Essential (primary) hypertension (Chronic)   Controlled BP with normal exam. Current regimen is Maxzide . Will continue same medications; encourage continued reduced sodium diet.       Relevant Orders   CBC with Differential/Platelet   Comprehensive metabolic panel   Urinalysis, Routine w reflex microscopic   Idiopathic insomnia (Chronic)   Chronic nightly symptoms. On Ambien  without evidence of misuse       Relevant Medications   zolpidem  (AMBIEN ) 10 MG tablet (Start on 01/10/2024)   Mixed hyperlipidemia (Chronic)   Managed with diet alone at this time.      Relevant Orders   Lipid panel   Rib lesion (Chronic)   Repeat CT chest 09/2023 1. Unchanged chronic  expansile lesion in the fourth rib posteriorly near the costovertebral junction since baseline CT of 07/31/2022; also grossly stable from chest radiographs 10/18/2015. This has sclerotic margins and central intermediate density, most consistent with a benign fibro-osseous lesion.      Hiatal hernia   Reflux symptoms are minimal on current therapy - protonix . No red flag signs such as weight loss, n/v, melena       Relevant Medications   pantoprazole  (PROTONIX ) 40 MG tablet   Other Visit Diagnoses       Annual physical exam    -  Primary   up to date on screenings and immunizations.     Encounter for screening mammogram for breast cancer       Relevant Orders   MM 3D SCREENING MAMMOGRAM BILATERAL BREAST       Return in about 6 months (around 05/13/2024) for HTN.     Leita HILARIO Adie, MD [facility] [place]

## 2023-11-14 NOTE — Assessment & Plan Note (Signed)
 Recent CT abdomen shows scarring without evidence of recurrance

## 2023-11-14 NOTE — Assessment & Plan Note (Addendum)
 Clinically stable on Prozac daily with good response, No SI or HI reported. Recently added low dose Xanax which she takes rarely. No change in management at this time.

## 2023-11-14 NOTE — Assessment & Plan Note (Signed)
Reflux symptoms are minimal on current therapy - protonix. No red flag signs such as weight loss, n/v, melena

## 2023-11-14 NOTE — Assessment & Plan Note (Signed)
 Chronic nightly symptoms. On Ambien without evidence of misuse

## 2023-11-15 ENCOUNTER — Encounter: Payer: Self-pay | Admitting: Internal Medicine

## 2023-11-15 LAB — CBC WITH DIFFERENTIAL/PLATELET
Basophils Absolute: 0 10*3/uL (ref 0.0–0.2)
Basos: 1 %
EOS (ABSOLUTE): 0.1 10*3/uL (ref 0.0–0.4)
Eos: 3 %
Hematocrit: 41.8 % (ref 34.0–46.6)
Hemoglobin: 13.9 g/dL (ref 11.1–15.9)
Immature Grans (Abs): 0 10*3/uL (ref 0.0–0.1)
Immature Granulocytes: 0 %
Lymphocytes Absolute: 2.6 10*3/uL (ref 0.7–3.1)
Lymphs: 55 %
MCH: 30.4 pg (ref 26.6–33.0)
MCHC: 33.3 g/dL (ref 31.5–35.7)
MCV: 92 fL (ref 79–97)
Monocytes Absolute: 0.4 10*3/uL (ref 0.1–0.9)
Monocytes: 9 %
Neutrophils Absolute: 1.5 10*3/uL (ref 1.4–7.0)
Neutrophils: 32 %
Platelets: 221 10*3/uL (ref 150–450)
RBC: 4.57 x10E6/uL (ref 3.77–5.28)
RDW: 11.3 % — ABNORMAL LOW (ref 11.7–15.4)
WBC: 4.7 10*3/uL (ref 3.4–10.8)

## 2023-11-15 LAB — URINALYSIS, ROUTINE W REFLEX MICROSCOPIC
Bilirubin, UA: NEGATIVE
Glucose, UA: NEGATIVE
Ketones, UA: NEGATIVE
Leukocytes,UA: NEGATIVE
Nitrite, UA: NEGATIVE
Protein,UA: NEGATIVE
RBC, UA: NEGATIVE
Specific Gravity, UA: 1.015 (ref 1.005–1.030)
Urobilinogen, Ur: 0.2 mg/dL (ref 0.2–1.0)
pH, UA: 6.5 (ref 5.0–7.5)

## 2023-11-15 LAB — COMPREHENSIVE METABOLIC PANEL
ALT: 12 [IU]/L (ref 0–32)
AST: 22 [IU]/L (ref 0–40)
Albumin: 4.4 g/dL (ref 3.9–4.9)
Alkaline Phosphatase: 66 [IU]/L (ref 44–121)
BUN/Creatinine Ratio: 15 (ref 12–28)
BUN: 13 mg/dL (ref 8–27)
Bilirubin Total: 0.5 mg/dL (ref 0.0–1.2)
CO2: 26 mmol/L (ref 20–29)
Calcium: 9.5 mg/dL (ref 8.7–10.3)
Chloride: 97 mmol/L (ref 96–106)
Creatinine, Ser: 0.84 mg/dL (ref 0.57–1.00)
Globulin, Total: 2 g/dL (ref 1.5–4.5)
Glucose: 96 mg/dL (ref 70–99)
Potassium: 3.7 mmol/L (ref 3.5–5.2)
Sodium: 138 mmol/L (ref 134–144)
Total Protein: 6.4 g/dL (ref 6.0–8.5)
eGFR: 75 mL/min/{1.73_m2} (ref >59)

## 2023-11-15 LAB — LIPID PANEL
Chol/HDL Ratio: 2.3 {ratio} (ref 0.0–4.4)
Cholesterol, Total: 244 mg/dL — ABNORMAL HIGH (ref 100–199)
HDL: 104 mg/dL (ref >39)
LDL Chol Calc (NIH): 126 mg/dL — ABNORMAL HIGH (ref 0–99)
Triglycerides: 81 mg/dL (ref 0–149)
VLDL Cholesterol Cal: 14 mg/dL (ref 5–40)

## 2023-11-15 LAB — TSH: TSH: 3.07 u[IU]/mL (ref 0.450–4.500)

## 2023-11-25 ENCOUNTER — Encounter: Payer: Medicare Other | Admitting: Internal Medicine

## 2024-01-06 ENCOUNTER — Ambulatory Visit
Admission: RE | Admit: 2024-01-06 | Discharge: 2024-01-06 | Disposition: A | Discharge status: Home / Self Care | Payer: Medicare Other | Source: Ambulatory Visit | Attending: Internal Medicine | Admitting: Internal Medicine

## 2024-01-06 DIAGNOSIS — Z1231 Encounter for screening mammogram for malignant neoplasm of breast: Secondary | ICD-10-CM | POA: Diagnosis not present

## 2024-01-16 ENCOUNTER — Other Ambulatory Visit: Payer: Self-pay | Admitting: Internal Medicine

## 2024-01-16 DIAGNOSIS — E782 Mixed hyperlipidemia: Secondary | ICD-10-CM

## 2024-01-16 DIAGNOSIS — F411 Generalized anxiety disorder: Secondary | ICD-10-CM

## 2024-01-16 MED ORDER — ROSUVASTATIN CALCIUM 5 MG PO TABS
5.0000 mg | ORAL_TABLET | Freq: Every day | ORAL | 1 refills | Status: DC
Start: 2024-01-16 — End: 2024-07-07

## 2024-01-16 MED ORDER — ALPRAZOLAM 0.25 MG PO TABS
0.2500 mg | ORAL_TABLET | Freq: Two times a day (BID) | ORAL | 0 refills | Status: DC | PRN
Start: 2024-01-16 — End: 2024-04-17

## 2024-01-16 NOTE — Telephone Encounter (Signed)
Please review. Duplicate  KP

## 2024-01-16 NOTE — Telephone Encounter (Signed)
 Requested medication (s) are due for refill today - yes  Requested medication (s) are on the active medication list -yes  Future visit scheduled -yes  Last refill: 11/01/23 #20  Notes to clinic: non delegated Rx  Requested Prescriptions  Pending Prescriptions Disp Refills   ALPRAZolam (XANAX) 0.25 MG tablet [Pharmacy Med Name: ALPRAZOLAM 0.25 MG TABLET] 20 tablet 0    Sig: TAKE 1 TABLET BY MOUTH 2 TIMES DAILY AS NEEDED FOR ANXIETY.     Not Delegated - Psychiatry: Anxiolytics/Hypnotics 2 Failed - 01/16/2024 10:39 AM      Failed - This refill cannot be delegated      Failed - Urine Drug Screen completed in last 360 days      Passed - Patient is not pregnant      Passed - Valid encounter within last 6 months    Recent Outpatient Visits           2 months ago Annual physical exam   [place] [facility] at [facility], Nyoka Cowden, MD   4 months ago Hemoperitoneum   [facility] at [facility], Nyoka Cowden, MD   6 months ago Urgency of urination   [place] [facility] at [facility]   7 months ago Colicky RUQ abdominal pain   [place] [facility] at [facility], Nyoka Cowden, MD   8 months ago Essential (primary) hypertension   [place] [facility] at [facility], Nyoka Cowden, MD       Future Appointments             In 3 months [name] Nyoka Cowden, MD [facility] at [facility], [facility]               Requested Prescriptions  Pending Prescriptions Disp Refills   ALPRAZolam (XANAX) 0.25 MG tablet [Pharmacy Med Name: ALPRAZOLAM 0.25 MG TABLET] 20 tablet 0    Sig: TAKE 1 TABLET BY MOUTH 2 TIMES DAILY AS NEEDED FOR ANXIETY.     Not Delegated - Psychiatry: Anxiolytics/Hypnotics 2 Failed - 01/16/2024 10:39 AM      Failed - This refill cannot be delegated       Failed - Urine Drug Screen completed in last 360 days      Passed - Patient is not pregnant      Passed - Valid encounter within last 6 months    Recent Outpatient Visits           2 months ago Annual physical exam   [place] [facility] at [facility], Nyoka Cowden, MD   4 months ago Hemoperitoneum   [facility] at [facility], Nyoka Cowden, MD   6 months ago Urgency of urination   [place] [facility] at [facility], [facility]   7 months ago Colicky RUQ abdominal pain    Primary Care & Sports Medicine at [facility], Nyoka Cowden, MD   8 months ago Essential (primary) hypertension   [facility] at [facility], Nyoka Cowden, MD       Future Appointments             In 3 months [name], Nyoka Cowden, MD [facility] Primary  Care & Sports Medicine at [facility], [facility]

## 2024-01-16 NOTE — Telephone Encounter (Signed)
 Please review.  KP

## 2024-01-27 DIAGNOSIS — D485 Neoplasm of uncertain behavior of skin: Secondary | ICD-10-CM | POA: Diagnosis not present

## 2024-01-27 DIAGNOSIS — L814 Other melanin hyperpigmentation: Secondary | ICD-10-CM | POA: Diagnosis not present

## 2024-01-27 DIAGNOSIS — R208 Other disturbances of skin sensation: Secondary | ICD-10-CM | POA: Diagnosis not present

## 2024-01-29 DIAGNOSIS — L570 Actinic keratosis: Secondary | ICD-10-CM | POA: Diagnosis not present

## 2024-01-30 DIAGNOSIS — L815 Leukoderma, not elsewhere classified: Secondary | ICD-10-CM | POA: Diagnosis not present

## 2024-01-30 DIAGNOSIS — L814 Other melanin hyperpigmentation: Secondary | ICD-10-CM | POA: Diagnosis not present

## 2024-01-30 DIAGNOSIS — D485 Neoplasm of uncertain behavior of skin: Secondary | ICD-10-CM | POA: Diagnosis not present

## 2024-01-30 DIAGNOSIS — L853 Xerosis cutis: Secondary | ICD-10-CM | POA: Diagnosis not present

## 2024-01-30 DIAGNOSIS — L728 Other follicular cysts of the skin and subcutaneous tissue: Secondary | ICD-10-CM | POA: Diagnosis not present

## 2024-01-30 DIAGNOSIS — L570 Actinic keratosis: Secondary | ICD-10-CM | POA: Diagnosis not present

## 2024-02-03 DIAGNOSIS — H5201 Hypermetropia, right eye: Secondary | ICD-10-CM | POA: Diagnosis not present

## 2024-02-03 DIAGNOSIS — H52223 Regular astigmatism, bilateral: Secondary | ICD-10-CM | POA: Diagnosis not present

## 2024-02-03 DIAGNOSIS — H527 Unspecified disorder of refraction: Secondary | ICD-10-CM | POA: Diagnosis not present

## 2024-02-03 DIAGNOSIS — H524 Presbyopia: Secondary | ICD-10-CM | POA: Diagnosis not present

## 2024-02-03 DIAGNOSIS — L570 Actinic keratosis: Secondary | ICD-10-CM | POA: Diagnosis not present

## 2024-02-03 DIAGNOSIS — H5212 Myopia, left eye: Secondary | ICD-10-CM | POA: Diagnosis not present

## 2024-02-09 ENCOUNTER — Other Ambulatory Visit: Payer: Self-pay | Admitting: Internal Medicine

## 2024-02-09 DIAGNOSIS — K5901 Slow transit constipation: Secondary | ICD-10-CM

## 2024-02-11 NOTE — Telephone Encounter (Signed)
 Requested medication (s) are due for refill today -yes  Requested medication (s) are on the active medication list -yes  Future visit scheduled -yes  Last refill: 08/27/23 #90 1RF  Notes to clinic: off protocol -provider review   Requested Prescriptions  Pending Prescriptions Disp Refills   TRULANCE 3 MG TABS [Pharmacy Med Name: Trulance 3 MG Oral Tablet] 90 tablet 3    Sig: TAKE 1 TABLET BY MOUTH DAILY     Off-Protocol Failed - 02/11/2024 12:22 PM      Failed - Medication not assigned to a protocol, review manually.      Failed - Valid encounter within last 12 months    Recent Outpatient Visits   None     Future Appointments             In 3 months [name], Nyoka Cowden, MD [facility] Primary [facility] & [facility] Medicine at [facility], [facility]               Requested Prescriptions  Pending Prescriptions Disp Refills   TRULANCE 3 MG TABS [Pharmacy Med Name: Trulance 3 MG Oral Tablet] 90 tablet 3    Sig: TAKE 1 TABLET BY MOUTH DAILY     Off-Protocol Failed - 02/11/2024 12:22 PM      Failed - Medication not assigned to a protocol, review manually.      Failed - Valid encounter within last 12 months    Recent Outpatient Visits   None     Future Appointments             In 3 months [name], Nyoka Cowden, MD [facility] Primary Care & Sports Medicine at [facility], [facility]

## 2024-02-11 NOTE — Telephone Encounter (Signed)
 Med refill

## 2024-02-12 DIAGNOSIS — D485 Neoplasm of uncertain behavior of skin: Secondary | ICD-10-CM | POA: Diagnosis not present

## 2024-02-12 DIAGNOSIS — L570 Actinic keratosis: Secondary | ICD-10-CM | POA: Diagnosis not present

## 2024-02-12 DIAGNOSIS — C44729 Squamous cell carcinoma of skin of left lower limb, including hip: Secondary | ICD-10-CM | POA: Diagnosis not present

## 2024-02-14 DIAGNOSIS — C44729 Squamous cell carcinoma of skin of left lower limb, including hip: Secondary | ICD-10-CM | POA: Diagnosis not present

## 2024-02-24 DIAGNOSIS — Z01810 Encounter for preprocedural cardiovascular examination: Secondary | ICD-10-CM | POA: Diagnosis not present

## 2024-02-24 DIAGNOSIS — Z01812 Encounter for preprocedural laboratory examination: Secondary | ICD-10-CM | POA: Diagnosis not present

## 2024-02-24 DIAGNOSIS — R001 Bradycardia, unspecified: Secondary | ICD-10-CM | POA: Diagnosis not present

## 2024-02-24 DIAGNOSIS — C44729 Squamous cell carcinoma of skin of left lower limb, including hip: Secondary | ICD-10-CM | POA: Diagnosis not present

## 2024-03-04 DIAGNOSIS — Z87891 Personal history of nicotine dependence: Secondary | ICD-10-CM | POA: Diagnosis not present

## 2024-03-04 DIAGNOSIS — E785 Hyperlipidemia, unspecified: Secondary | ICD-10-CM | POA: Diagnosis not present

## 2024-03-04 DIAGNOSIS — C44729 Squamous cell carcinoma of skin of left lower limb, including hip: Secondary | ICD-10-CM | POA: Diagnosis not present

## 2024-03-04 DIAGNOSIS — C7652 Malignant neoplasm of left lower limb: Secondary | ICD-10-CM | POA: Diagnosis not present

## 2024-03-08 ENCOUNTER — Other Ambulatory Visit: Payer: Self-pay | Admitting: Internal Medicine

## 2024-03-09 DIAGNOSIS — C44729 Squamous cell carcinoma of skin of left lower limb, including hip: Secondary | ICD-10-CM | POA: Diagnosis not present

## 2024-03-10 NOTE — Telephone Encounter (Signed)
 Requested Prescriptions  Pending Prescriptions Disp Refills   FLUoxetine  (PROZAC ) 20 MG capsule [Pharmacy Med Name: FLUoxetine  HCl 20 MG Oral Capsule] 100 capsule 0    Sig: TAKE 1 CAPSULE BY MOUTH DAILY     Psychiatry:  Antidepressants - SSRI Failed - 03/10/2024  1:54 PM      Failed - Valid encounter within last 6 months    Recent Outpatient Visits   None     Future Appointments             In 2 months Gala Jubilee, [place]ales [place], MD [facility] Primary Care & Sports Medicine at [facility], [facility]

## 2024-03-23 ENCOUNTER — Other Ambulatory Visit: Payer: Self-pay | Admitting: Internal Medicine

## 2024-03-23 DIAGNOSIS — K5901 Slow transit constipation: Secondary | ICD-10-CM

## 2024-03-23 MED ORDER — TRULANCE 3 MG PO TABS: 1.0000 | ORAL_TABLET | Freq: Every day | ORAL | 3 refills | Status: AC

## 2024-04-03 ENCOUNTER — Telehealth: Admitting: Emergency Medicine

## 2024-04-03 DIAGNOSIS — N3001 Acute cystitis with hematuria: Secondary | ICD-10-CM | POA: Diagnosis not present

## 2024-04-03 DIAGNOSIS — R300 Dysuria: Secondary | ICD-10-CM

## 2024-04-03 NOTE — Progress Notes (Signed)
 Patient not presently located in [place].  Patient advised to follow-up at urgent care or try a nationwide virtual care provider. No charge.

## 2024-04-15 ENCOUNTER — Other Ambulatory Visit: Payer: Self-pay | Admitting: Internal Medicine

## 2024-04-15 ENCOUNTER — Other Ambulatory Visit: Payer: Self-pay | Admitting: Physician Assistant

## 2024-04-15 DIAGNOSIS — K5901 Slow transit constipation: Secondary | ICD-10-CM

## 2024-04-15 DIAGNOSIS — F411 Generalized anxiety disorder: Secondary | ICD-10-CM

## 2024-04-16 NOTE — Telephone Encounter (Signed)
 Requested medications are due for refill today.  yes  Requested medications are on the active medications list.  yes  Last refill. 01/16/2024 #20 0 rf  Future visit scheduled.   yes  Notes to clinic.  Refill not delegated.    Requested Prescriptions  Pending Prescriptions Disp Refills   ALPRAZolam  (XANAX ) 0.25 MG tablet [Pharmacy Med Name: ALPRAZOLAM  0.25 MG TABLET] 20 tablet 0    Sig: TAKE 1 TABLET BY MOUTH 2 TIMES DAILY AS NEEDED FOR ANXIETY.     Not Delegated - Psychiatry: Anxiolytics/Hypnotics 2 Failed - 04/16/2024  4:42 PM      Failed - This refill cannot be delegated      Failed - Urine Drug Screen completed in last 360 days      Failed - Valid encounter within last 6 months    Recent Outpatient Visits   None     Future Appointments             In 3 weeks Gala Jubilee, Chales [place], MD [facility] Primary Care & Sports Medicine at [facility], [facility]            Passed - Patient is not pregnant

## 2024-04-17 NOTE — Telephone Encounter (Signed)
 Please review.  KP

## 2024-04-21 DIAGNOSIS — C44729 Squamous cell carcinoma of skin of left lower limb, including hip: Secondary | ICD-10-CM | POA: Diagnosis not present

## 2024-04-28 DIAGNOSIS — L814 Other melanin hyperpigmentation: Secondary | ICD-10-CM | POA: Diagnosis not present

## 2024-04-28 DIAGNOSIS — R238 Other skin changes: Secondary | ICD-10-CM | POA: Diagnosis not present

## 2024-04-28 DIAGNOSIS — L538 Other specified erythematous conditions: Secondary | ICD-10-CM | POA: Diagnosis not present

## 2024-04-28 DIAGNOSIS — B078 Other viral warts: Secondary | ICD-10-CM | POA: Diagnosis not present

## 2024-04-28 DIAGNOSIS — L570 Actinic keratosis: Secondary | ICD-10-CM | POA: Diagnosis not present

## 2024-04-28 DIAGNOSIS — Z85828 Personal history of other malignant neoplasm of skin: Secondary | ICD-10-CM | POA: Diagnosis not present

## 2024-04-28 DIAGNOSIS — Z08 Encounter for follow-up examination after completed treatment for malignant neoplasm: Secondary | ICD-10-CM | POA: Diagnosis not present

## 2024-04-28 DIAGNOSIS — R208 Other disturbances of skin sensation: Secondary | ICD-10-CM | POA: Diagnosis not present

## 2024-04-28 DIAGNOSIS — X32XXXA Exposure to sunlight, initial encounter: Secondary | ICD-10-CM | POA: Diagnosis not present

## 2024-05-07 ENCOUNTER — Other Ambulatory Visit: Payer: Self-pay

## 2024-05-07 ENCOUNTER — Emergency Department

## 2024-05-07 ENCOUNTER — Observation Stay
Admission: EM | Admit: 2024-05-07 | Discharge: 2024-05-09 | Disposition: A | Discharge status: Home / Self Care | Attending: Emergency Medicine | Admitting: Emergency Medicine

## 2024-05-07 ENCOUNTER — Observation Stay

## 2024-05-07 DIAGNOSIS — K8012 Calculus of gallbladder with acute and chronic cholecystitis without obstruction: Principal | ICD-10-CM | POA: Insufficient documentation

## 2024-05-07 DIAGNOSIS — R0789 Other chest pain: Secondary | ICD-10-CM | POA: Diagnosis not present

## 2024-05-07 DIAGNOSIS — Z85528 Personal history of other malignant neoplasm of kidney: Secondary | ICD-10-CM | POA: Insufficient documentation

## 2024-05-07 DIAGNOSIS — K5730 Diverticulosis of large intestine without perforation or abscess without bleeding: Secondary | ICD-10-CM | POA: Insufficient documentation

## 2024-05-07 DIAGNOSIS — R109 Unspecified abdominal pain: Secondary | ICD-10-CM | POA: Diagnosis not present

## 2024-05-07 DIAGNOSIS — R1011 Right upper quadrant pain: Principal | ICD-10-CM

## 2024-05-07 DIAGNOSIS — K5750 Diverticulosis of both small and large intestine without perforation or abscess without bleeding: Secondary | ICD-10-CM | POA: Diagnosis not present

## 2024-05-07 DIAGNOSIS — K819 Cholecystitis, unspecified: Secondary | ICD-10-CM | POA: Diagnosis present

## 2024-05-07 DIAGNOSIS — Z96652 Presence of left artificial knee joint: Secondary | ICD-10-CM | POA: Diagnosis not present

## 2024-05-07 DIAGNOSIS — R079 Chest pain, unspecified: Secondary | ICD-10-CM | POA: Diagnosis not present

## 2024-05-07 DIAGNOSIS — K810 Acute cholecystitis: Secondary | ICD-10-CM | POA: Diagnosis not present

## 2024-05-07 DIAGNOSIS — R112 Nausea with vomiting, unspecified: Secondary | ICD-10-CM

## 2024-05-07 DIAGNOSIS — K760 Fatty (change of) liver, not elsewhere classified: Secondary | ICD-10-CM | POA: Diagnosis not present

## 2024-05-07 DIAGNOSIS — Z87891 Personal history of nicotine dependence: Secondary | ICD-10-CM | POA: Insufficient documentation

## 2024-05-07 DIAGNOSIS — R0602 Shortness of breath: Secondary | ICD-10-CM | POA: Diagnosis not present

## 2024-05-07 DIAGNOSIS — Z79899 Other long term (current) drug therapy: Secondary | ICD-10-CM | POA: Insufficient documentation

## 2024-05-07 DIAGNOSIS — I10 Essential (primary) hypertension: Secondary | ICD-10-CM | POA: Insufficient documentation

## 2024-05-07 DIAGNOSIS — N281 Cyst of kidney, acquired: Secondary | ICD-10-CM | POA: Diagnosis not present

## 2024-05-07 DIAGNOSIS — K769 Liver disease, unspecified: Secondary | ICD-10-CM | POA: Diagnosis not present

## 2024-05-07 DIAGNOSIS — R10817 Generalized abdominal tenderness: Secondary | ICD-10-CM | POA: Diagnosis not present

## 2024-05-07 DIAGNOSIS — K8020 Calculus of gallbladder without cholecystitis without obstruction: Secondary | ICD-10-CM | POA: Diagnosis not present

## 2024-05-07 LAB — COMPREHENSIVE METABOLIC PANEL WITH GFR
ALT: 90 U/L — ABNORMAL HIGH (ref 0–44)
ALT: 253 U/L — ABNORMAL HIGH (ref 0–44)
AST: 297 U/L — ABNORMAL HIGH (ref 15–41)
AST: 478 U/L — ABNORMAL HIGH (ref 15–41)
Albumin: 3.8 g/dL (ref 3.5–5.0)
Albumin: 3.4 g/dL — ABNORMAL LOW (ref 3.5–5.0)
Alkaline Phosphatase: 59 U/L (ref 38–126)
Alkaline Phosphatase: 75 U/L (ref 38–126)
Anion gap: 10 (ref 5–15)
Anion gap: 11 (ref 5–15)
BUN: 8 mg/dL (ref 8–23)
BUN: 6 mg/dL — ABNORMAL LOW (ref 8–23)
CO2: 28 mmol/L (ref 22–32)
CO2: 24 mmol/L (ref 22–32)
Calcium: 8.6 mg/dL — ABNORMAL LOW (ref 8.9–10.3)
Calcium: 8.3 mg/dL — ABNORMAL LOW (ref 8.9–10.3)
Chloride: 96 mmol/L — ABNORMAL LOW (ref 98–111)
Chloride: 100 mmol/L (ref 98–111)
Creatinine, Ser: 0.68 mg/dL (ref 0.44–1.00)
Creatinine, Ser: 0.62 mg/dL (ref 0.44–1.00)
GFR, Estimated: >60 mL/min (ref >60)
GFR, Estimated: >60 mL/min (ref >60)
Glucose, Bld: 139 mg/dL — ABNORMAL HIGH (ref 70–99)
Glucose, Bld: 102 mg/dL — ABNORMAL HIGH (ref 70–99)
Potassium: 2.7 mmol/L — CL (ref 3.5–5.1)
Potassium: 3.5 mmol/L (ref 3.5–5.1)
Sodium: 134 mmol/L — ABNORMAL LOW (ref 135–145)
Sodium: 135 mmol/L (ref 135–145)
Total Bilirubin: 1.2 mg/dL (ref 0.0–1.2)
Total Bilirubin: 1.7 mg/dL — ABNORMAL HIGH (ref 0.0–1.2)
Total Protein: 6.4 g/dL — ABNORMAL LOW (ref 6.5–8.1)
Total Protein: 5.6 g/dL — ABNORMAL LOW (ref 6.5–8.1)

## 2024-05-07 LAB — CBC
HCT: 37.6 % (ref 36.0–46.0)
Hemoglobin: 12.4 g/dL (ref 12.0–15.0)
MCH: 29.8 pg (ref 26.0–34.0)
MCHC: 33 g/dL (ref 30.0–36.0)
MCV: 90.4 fL (ref 80.0–100.0)
Platelets: 177 10*3/uL (ref 150–400)
RBC: 4.16 MIL/uL (ref 3.87–5.11)
RDW: 12.6 % (ref 11.5–15.5)
WBC: 8.5 10*3/uL (ref 4.0–10.5)
nRBC: 0 % (ref 0.0–0.2)

## 2024-05-07 LAB — URINALYSIS, ROUTINE W REFLEX MICROSCOPIC
Bilirubin Urine: NEGATIVE
Glucose, UA: NEGATIVE mg/dL
Hgb urine dipstick: NEGATIVE
Ketones, ur: 5 mg/dL — AB
Leukocytes,Ua: NEGATIVE
Nitrite: NEGATIVE
Protein, ur: NEGATIVE mg/dL
Specific Gravity, Urine: 1.016 (ref 1.005–1.030)
pH: 5 (ref 5.0–8.0)

## 2024-05-07 LAB — TROPONIN I (HIGH SENSITIVITY)
Troponin I (High Sensitivity): 2 ng/L (ref <18)
Troponin I (High Sensitivity): 3 ng/L (ref <18)

## 2024-05-07 LAB — LIPASE, BLOOD: Lipase: 33 U/L (ref 11–51)

## 2024-05-07 MED ORDER — ZOLPIDEM TARTRATE 5 MG PO TABS
5.0000 mg | ORAL_TABLET | Freq: Every evening | ORAL | Status: DC | PRN
  Administered 2024-05-08: 5 mg via ORAL
  Filled 2024-05-07: qty 1

## 2024-05-07 MED ORDER — MORPHINE SULFATE (PF) 4 MG/ML IV SOLN
4.0000 mg | Freq: Once | INTRAVENOUS | Status: AC
  Administered 2024-05-07: 4 mg via INTRAVENOUS
  Filled 2024-05-07: qty 1

## 2024-05-07 MED ORDER — ACETAMINOPHEN 500 MG PO TABS
1000.0000 mg | ORAL_TABLET | Freq: Four times a day (QID) | ORAL | Status: DC
  Administered 2024-05-07 – 2024-05-09 (×7): 1000 mg via ORAL
  Filled 2024-05-07 (×8): qty 2

## 2024-05-07 MED ORDER — OXYCODONE HCL 5 MG PO TABS
5.0000 mg | ORAL_TABLET | ORAL | Status: DC | PRN
  Administered 2024-05-07 – 2024-05-09 (×6): 10 mg via ORAL
  Filled 2024-05-07 (×3): qty 2
  Filled 2024-05-07: qty 1
  Filled 2024-05-07: qty 2
  Filled 2024-05-07: qty 1
  Filled 2024-05-07 (×2): qty 2

## 2024-05-07 MED ORDER — POTASSIUM CHLORIDE 10 MEQ/100ML IV SOLN
10.0000 meq | INTRAVENOUS | Status: AC
  Administered 2024-05-07 (×2): 10 meq via INTRAVENOUS
  Filled 2024-05-07 (×2): qty 100

## 2024-05-07 MED ORDER — PIPERACILLIN-TAZOBACTAM 3.375 G IVPB
3.3750 g | Freq: Three times a day (TID) | INTRAVENOUS | Status: DC
  Administered 2024-05-07 – 2024-05-09 (×6): 3.375 g via INTRAVENOUS
  Filled 2024-05-07 (×6): qty 50

## 2024-05-07 MED ORDER — POTASSIUM CHLORIDE CRYS ER 20 MEQ PO TBCR
40.0000 meq | EXTENDED_RELEASE_TABLET | Freq: Once | ORAL | Status: AC
  Administered 2024-05-07: 40 meq via ORAL
  Filled 2024-05-07: qty 2

## 2024-05-07 MED ORDER — FLUOXETINE HCL 20 MG PO CAPS
20.0000 mg | ORAL_CAPSULE | Freq: Every day | ORAL | Status: DC
  Administered 2024-05-08 – 2024-05-09 (×2): 20 mg via ORAL
  Filled 2024-05-07 (×3): qty 1

## 2024-05-07 MED ORDER — PANTOPRAZOLE SODIUM 40 MG PO TBEC
40.0000 mg | DELAYED_RELEASE_TABLET | Freq: Every day | ORAL | Status: DC
  Administered 2024-05-07 – 2024-05-09 (×3): 40 mg via ORAL
  Filled 2024-05-07 (×3): qty 1

## 2024-05-07 MED ORDER — ONDANSETRON HCL 4 MG/2ML IJ SOLN
4.0000 mg | Freq: Four times a day (QID) | INTRAMUSCULAR | Status: DC | PRN
  Administered 2024-05-08: 4 mg via INTRAVENOUS
  Filled 2024-05-07: qty 2

## 2024-05-07 MED ORDER — ONDANSETRON 4 MG PO TBDP: 4.0000 mg | ORAL_TABLET | Freq: Four times a day (QID) | ORAL | Status: DC | PRN

## 2024-05-07 MED ORDER — ROSUVASTATIN CALCIUM 10 MG PO TABS
5.0000 mg | ORAL_TABLET | Freq: Every day | ORAL | Status: DC
  Administered 2024-05-07 – 2024-05-09 (×3): 5 mg via ORAL
  Filled 2024-05-07 (×4): qty 1

## 2024-05-07 MED ORDER — INDOCYANINE GREEN 25 MG IV SOLR
1.2500 mg | INTRAVENOUS | Status: AC
  Administered 2024-05-08: 1.25 mg via INTRAVENOUS
  Filled 2024-05-07: qty 10

## 2024-05-07 MED ORDER — ALPRAZOLAM 0.25 MG PO TABS: 0.2500 mg | ORAL_TABLET | Freq: Two times a day (BID) | ORAL | Status: DC | PRN

## 2024-05-07 MED ORDER — PIPERACILLIN-TAZOBACTAM 3.375 G IVPB 30 MIN
3.3750 g | Freq: Once | INTRAVENOUS | Status: AC
  Administered 2024-05-07: 3.375 g via INTRAVENOUS
  Filled 2024-05-07 (×2): qty 50

## 2024-05-07 MED ORDER — GADOBUTROL 1 MMOL/ML IV SOLN
7.5000 mL | Freq: Once | INTRAVENOUS | Status: AC | PRN
  Administered 2024-05-07: 7.5 mL via INTRAVENOUS

## 2024-05-07 MED ORDER — MORPHINE SULFATE (PF) 2 MG/ML IV SOLN
2.0000 mg | INTRAVENOUS | Status: DC | PRN
  Administered 2024-05-07: 4 mg via INTRAVENOUS
  Filled 2024-05-07: qty 2

## 2024-05-07 MED ORDER — TRIAMTERENE-HCTZ 37.5-25 MG PO TABS
1.0000 | ORAL_TABLET | Freq: Every day | ORAL | Status: DC
  Administered 2024-05-09: 1 via ORAL
  Filled 2024-05-07 (×3): qty 1

## 2024-05-07 MED ORDER — IOHEXOL 300 MG/ML  SOLN
100.0000 mL | Freq: Once | INTRAMUSCULAR | Status: AC | PRN
  Administered 2024-05-07: 100 mL via INTRAVENOUS

## 2024-05-07 MED ORDER — SODIUM CHLORIDE 0.9 % IV SOLN: INTRAVENOUS | Status: DC

## 2024-05-07 MED ORDER — POTASSIUM CHLORIDE 10 MEQ/100ML IV SOLN
10.0000 meq | Freq: Once | INTRAVENOUS | Status: AC
  Administered 2024-05-07: 10 meq via INTRAVENOUS
  Filled 2024-05-07: qty 100

## 2024-05-07 MED ORDER — KETOROLAC TROMETHAMINE 15 MG/ML IJ SOLN
15.0000 mg | Freq: Once | INTRAMUSCULAR | Status: AC
  Administered 2024-05-07: 15 mg via INTRAVENOUS
  Filled 2024-05-07: qty 1

## 2024-05-07 MED ORDER — LIDOCAINE 5 % EX PTCH
1.0000 | MEDICATED_PATCH | Freq: Once | CUTANEOUS | Status: AC
  Administered 2024-05-07: 1 via TRANSDERMAL
  Filled 2024-05-07: qty 1

## 2024-05-07 NOTE — ED Notes (Signed)
 Patient's family member stepped out and stated that the patient needed to use the restroom. Patient was unhooked from the monitor and fluids and allowed to go to the restroom. Patient was advised to alert staff when she was back in bed so that she could be hooked up again.

## 2024-05-07 NOTE — ED Provider Notes (Signed)
 [facility] Provider Note    Event Date/Time   First MD Initiated Contact with Patient 05/07/24 [phone]     (approximate)   History   Abdominal Pain   HPI Tracy Chase is a 68 y.o. female with history of HTN, HLD, anxiety presenting today for abdominal pain.  Patient states yesterday she had a brief episode of chest pain associated with left arm pain.  That has completely resolved at this time.  She then woke up overnight complaining of right flank pain and had an episode with nausea and vomiting.  Denies any nausea at this time but still having pain along her right flank as well as her abdomen.  She has chronic abdominal pain symptoms and is unsure if any of this is worse.  Denies any active chest pain, shortness of breath, diarrhea, constipation, dysuria.  Recently treated for UTI.  No fevers, cough, congestion.  No prior cardiac history.     Physical Exam   Triage Vital Signs: ED Triage Vitals  Encounter Vitals Group     BP 05/07/24 0357 132/61     Girls Systolic BP Percentile --      Girls Diastolic BP Percentile --      Boys Systolic BP Percentile --      Boys Diastolic BP Percentile --      Pulse Rate 05/07/24 0357 100     Resp 05/07/24 0357 18     Temp 05/07/24 0357 98.2 F (36.8 C)     Temp src --      SpO2 05/07/24 0357 99 %     Weight 05/07/24 0356 172 lb (78 kg)     Height 05/07/24 0356 5' 6 (1.676 m)     Head Circumference --      Peak Flow --      Pain Score 05/07/24 0356 9     Pain Loc --      Pain Education --      Exclude from Growth Chart --     Most recent vital signs: Vitals:   05/07/24 0500 05/07/24 0600  BP: (!) 120/55 118/61  Pulse: (!) 56   Resp: 13 15  Temp:    SpO2: 92% 92%   Physical Exam: I have reviewed the vital signs and nursing notes. General: Awake, alert, no acute distress.  Nontoxic appearing. Head:  Atraumatic, normocephalic.   ENT:  EOM intact, PERRL. Oral mucosa is pink and moist with no  lesions. Neck: Neck is supple with full range of motion, No meningeal signs. Cardiovascular:  RRR, No murmurs. Peripheral pulses palpable and equal bilaterally. Respiratory:  Symmetrical chest wall expansion.  No rhonchi, rales, or wheezes.  Good air movement throughout.  No use of accessory muscles.   Musculoskeletal:  No cyanosis or edema. Moving extremities with full ROM Abdomen:  Soft, mild generalized tenderness throughout the abdomen, palpable tenderness along the right mid thoracic region with no obvious deformities or bruises, nondistended. Neuro:  GCS 15, moving all four extremities, interacting appropriately. Speech clear. Psych:  Calm, appropriate.   Skin:  Warm, dry, no rash.    ED Results / Procedures / Treatments   Labs (all labs ordered are listed, but only abnormal results are displayed) Labs Reviewed  COMPREHENSIVE METABOLIC PANEL WITH GFR - Abnormal; Notable for the following components:      Result Value   Sodium 134 (*)    Potassium 2.7 (*)    Chloride 96 (*)    Glucose, Bld  139 (*)    Calcium  8.6 (*)    Total Protein 6.4 (*)    AST 297 (*)    ALT 90 (*)    All other components within normal limits  URINALYSIS, ROUTINE W REFLEX MICROSCOPIC - Abnormal; Notable for the following components:   Color, Urine YELLOW (*)    APPearance CLEAR (*)    Ketones, ur 5 (*)    All other components within normal limits  LIPASE, BLOOD  CBC  TROPONIN I (HIGH SENSITIVITY)  TROPONIN I (HIGH SENSITIVITY)     EKG My EKG interpretation: Rate of 51, normal sinus rhythm, normal axis, normal intervals.  No acute ST elevations or depressions   RADIOLOGY Independently interpreted CT abdomen/pelvis showing evidence of pericholecystic fluid and recommending right upper quadrant ultrasound   PROCEDURES:  Critical Care performed: No  Procedures   MEDICATIONS ORDERED IN ED: Medications  lidocaine  (LIDODERM ) 5 % 1 patch (1 patch Transdermal Patch Applied 05/07/24 0553)   morphine  (PF) 4 MG/ML injection 4 mg (has no administration in time range)  potassium chloride 10 mEq in 100 mL IVPB (10 mEq Intravenous New Bag/Given 05/07/24 0540)  iohexol  (OMNIPAQUE ) 300 MG/ML solution 100 mL (100 mLs Intravenous Contrast Given 05/07/24 0527)  ketorolac  (TORADOL ) 15 MG/ML injection 15 mg (15 mg Intravenous Given 05/07/24 0554)     IMPRESSION / MDM / ASSESSMENT AND PLAN / ED COURSE  I reviewed the triage vital signs and the nursing notes.                              Differential diagnosis includes, but is not limited to, acute on chronic abdominal pain, musculoskeletal pain, UTI, pyelonephritis, nephrolithiasis, cholecystitis, SBO, lower concern for ACS or pneumonia  Patient's presentation is most consistent with acute complicated illness / injury requiring diagnostic workup.  Patient is a 68 year old female presenting today for initial chest pain and left arm pain which has resolved and now having generalized abdominal pain and right flank pain.  Patient has chronic abdominal pain complaints and is unsure if this is any worse than normal but the right flank pain is different.  Hard to describe it is true CVA tenderness because even light touch to the area is painful.  No trauma that she is aware of.  Vital signs otherwise stable.  Politely declined any pain medication.  EKG without any obvious ischemic findings.  Additional laboratory workup ordered to evaluate for ACS as well as acute intra-abdominal pathology along with chest x-ray and CT abdomen/pelvis.  Second troponin negative and chest x-ray unremarkable.  No concerns for ACS.  Patient had increasing pain was initially given Toradol  and Lidoderm .  CT imaging shows concern for possible pericholecystic fluid but cannot fully identify cholecystitis.  Will get follow-up right upper quadrant ultrasound for further evaluation.  Patient given morphine  as well for pain symptoms.  Signed out pending results of right upper quadrant  ultrasound for ultimate disposition.  The patient is on the cardiac monitor to evaluate for evidence of arrhythmia and/or significant heart rate changes.     FINAL CLINICAL IMPRESSION(S) / ED DIAGNOSES   Final diagnoses:  Right upper quadrant pain  Nausea and vomiting, unspecified vomiting type     Rx / DC Orders   ED Discharge Orders     None        Note:  This document was prepared using Dragon voice recognition software and may include unintentional dictation errors.  Malvina Alm DASEN, MD 05/07/24 [phone]

## 2024-05-07 NOTE — ED Triage Notes (Signed)
 Pt reports yesterday she had left arm pain and tonight developed some right upper back pain that radiates around to her front. Pt reports she also has had some n/v.

## 2024-05-07 NOTE — H&P (Signed)
 [facility] SURGICAL HISTORY & PHYSICAL (cpt [phone])  HISTORY OF PRESENT ILLNESS (HPI):  68 y.o. female presented to [facility] ED overnight for abdominal pain. Patient reports the acute onset of RUQ abdominal pain which woke her up from sleep. She reports she has a long standing history of intermittent abdominal pain in this area but this is more severe. She did have nausea and emesis as well. No fever, chills, cough, CP, SOB, bowel changes, juandice. She does have a known history of cholelithiasis but never seen a surgeon for this specifically. She denied any significant post-prandial pain. However, she does report some discomfort with eating since her admission for diverticulitis of the duodenum. Previous surgical history positive for abdominal hysterectomy, appendectomy, ventral hernia repair. Work up in the ED revealed a normal WBC at 8.5K, Hgb to 12.4, sCr - 0.98, hypokalemia to 2.7, AST 297, ALT 90, bilirubin 1.2, lipase normal at 33. She did have RUQ US  concerning for cholelithiasis with wall changes suggestive of cholecystitis.   General surgery is consulted by emergency medicine physician Dr Nilsa Dade, MD for evaluation and management of acute cholecystitis.     PAST MEDICAL HISTORY (PMH):  Past Medical History:  Diagnosis Date   Anemia    h/o   Aortic atherosclerosis (HCC)    Arthritis    Cancer (HCC)    RENAL CELL   Depression    Diverticulitis of small intestine with perforation and abscess 05/31/2023   GERD (gastroesophageal reflux disease)    OCC NO MEDS   Hypertension    Incisional hernia    Insomnia    Rib lesion    3 x 2.1 x 2 cm expansile right fourth rib posteriorly   Sepsis (HCC)     Reviewed. Otherwise negative.   PAST SURGICAL HISTORY [facility]):  Past Surgical History:  Procedure Laterality Date   ABDOMINAL HYSTERECTOMY     APPENDECTOMY  2011   Lap converted to exlap appendectomy, drainage of abd abscesses   CESAREAN SECTION     COLONOSCOPY WITH  PROPOFOL  N/A 08/28/2019   Procedure: COLONOSCOPY WITH PROPOFOL ;  Surgeon: Janalyn Keene NOVAK, MD;  Location: [facility] ENDOSCOPY;  Service: Endoscopy;  Laterality: N/A;   COLONOSCOPY WITH PROPOFOL  N/A 08/21/2023   Procedure: COLONOSCOPY WITH PROPOFOL ;  Surgeon: Therisa Bi, MD;  Location: [facility] ENDOSCOPY;  Service: Gastroenterology;  Laterality: N/A;   cryoablation for renal cell carcinoma  2012   ESOPHAGOGASTRODUODENOSCOPY (EGD) WITH PROPOFOL  N/A 08/21/2023   Procedure: ESOPHAGOGASTRODUODENOSCOPY (EGD) WITH PROPOFOL ;  Surgeon: Therisa Bi, MD;  Location: [facility] ENDOSCOPY;  Service: Gastroenterology;  Laterality: N/A;   ETHMOIDECTOMY Left 09/06/2020   Procedure: TOTAL ETHMOIDECTOMY;  Surgeon: Blair Mt, MD;  Location: [facility];  Service: ENT;  Laterality: Left;   FRONTAL SINUS EXPLORATION Left 09/06/2020   Procedure: FRONTAL SINUS EXPLORATION;  Surgeon: Blair Mt, MD;  Location: [facility];  Service: ENT;  Laterality: Left;   IMAGE GUIDED SINUS SURGERY N/A 09/06/2020   Procedure: IMAGE GUIDED SINUS SURGERY;  Surgeon: Blair Mt, MD;  Location: [facility];  Service: ENT;  Laterality: N/A;  need stryker disk PLACE DISK ON OR CHARGE NURSE DESK 10-01 KP   INSERTION OF MESH  01/10/2023   Procedure: INSERTION OF MESH;  Surgeon: Desiderio Schanz, MD;  Location: [facility] ORS;  Service: General;;   JOINT REPLACEMENT Left    partial knee replacement   LUMBAR FUSION  1994   MAXILLARY ANTROSTOMY Left 09/06/2020   Procedure: MAXILLARY ANTROSTOMY WITH TISSUE;  Surgeon: Blair Mt,  MD;  Location: [facility];  Service: ENT;  Laterality: Left;   MEDIAL PARTIAL KNEE REPLACEMENT Left    SPHENOIDECTOMY Left 09/06/2020   Procedure: SPHENOIDECTOMY;  Surgeon: Blair Mt, MD;  Location: [facility];  Service: ENT;  Laterality: Left;   TONSILLECTOMY     TOTAL KNEE REVISION Left 02/21/2021   Procedure: Revision, left partial to total knee;  Surgeon: Kathlynn Sharper, MD;   Location: [facility] ORS;  Service: Orthopedics;  Laterality: Left;  CHRIS GAINES TO ASSIST   VENTRAL HERNIA REPAIR  2012   open ventral hernia repair with mesh   XI ROBOTIC ASSISTED VENTRAL HERNIA N/A 01/10/2023   Procedure: XI ROBOTIC ASSISTED VENTRAL HERNIA WITH LYSIS OF ADHESIONS;  Surgeon: Desiderio Schanz, MD;  Location: [facility] ORS;  Service: General;  Laterality: N/A;    Reviewed. Otherwise negative.   MEDICATIONS:  Prior to Admission medications   Medication Sig Start Date End Date Taking? Authorizing Provider  ALPRAZolam  (XANAX ) 0.25 MG tablet TAKE 1 TABLET BY MOUTH 2 TIMES DAILY AS NEEDED FOR ANXIETY. 04/17/24   Justus Leita DEL, MD  FLUoxetine  (PROZAC ) 20 MG capsule TAKE 1 CAPSULE BY MOUTH DAILY 03/10/24   Justus Leita DEL, MD  pantoprazole  (PROTONIX ) 40 MG tablet Take 1 tablet (40 mg total) by mouth daily. 11/14/23 12/14/23  Justus Leita DEL, MD  Plecanatide  (TRULANCE ) 3 MG TABS Take 1 tablet (3 mg total) by mouth daily. 03/23/24   Justus Leita DEL, MD  rosuvastatin  (CRESTOR ) 5 MG tablet Take 1 tablet (5 mg total) by mouth daily. 01/16/24   Justus Leita DEL, MD  traMADol  (ULTRAM ) 50 MG tablet Take 1 tablet (50 mg total) by mouth every 6 (six) hours as needed for moderate pain. 05/29/23 05/28/24  Desiderio Schanz, MD  triamterene -hydrochlorothiazide  (MAXZIDE -25) 37.5-25 MG tablet Take 1 tablet by mouth daily. 05/20/23   Justus Leita DEL, MD  zolpidem  (AMBIEN ) 10 MG tablet Take 1 tablet (10 mg total) by mouth at bedtime. 01/10/24   Justus Leita DEL, MD     ALLERGIES:  Allergies  Allergen Reactions   Hydrocodone  Itching     SOCIAL HISTORY:  Social History   Socioeconomic History   Marital status: Significant Other    Spouse name: Not on file   Number of children: 1   Years of education: Not on file   Highest education level: Not on file  Occupational History   Occupation: Retired  Tobacco Use   Smoking status: Former    Current packs/day: 0.00    Types: Cigarettes    Quit date: 1990     Years since quitting: 35.5    Passive exposure: Past   Smokeless tobacco: Never   Tobacco comments:    was social Smoker over 15 yrs ago  Vaping Use   Vaping status: Never Used  Substance and Sexual Activity   Alcohol use: Yes    Comment: 2-3 times weekly   Drug use: No   Sexual activity: Not on file  Other Topics Concern   Not on file  Social History Narrative   Pt has 1 son and 2 step daughters. Lives with significant other   Social Drivers of Corporate investment banker Strain: Low Risk  (07/30/2023)   Overall Financial Resource Strain (CARDIA)    Difficulty of Paying Living Expenses: Not hard at all  Food Insecurity: No Food Insecurity (08/21/2023)   Hunger Vital Sign    Worried About Running Out of Food in the Last Year: Never true  Ran Out of Food in the Last Year: Never true  Transportation Needs: No Transportation Needs (08/21/2023)   PRAPARE - Administrator, Civil Service (Medical): No    Lack of Transportation (Non-Medical): No  Physical Activity: Sufficiently Active (07/30/2023)   Exercise Vital Sign    Days of Exercise per Week: 4 days    Minutes of Exercise per Session: 40 min  Stress: No Stress Concern Present (07/30/2023)   [employer] of Occupational Health - Occupational Stress Questionnaire    Feeling of Stress : Not at all  Social Connections: Moderately Isolated (07/30/2023)   Social Connection and Isolation Panel    Frequency of Communication with Friends and Family: More than three times a week    Frequency of Social Gatherings with Friends and Family: Twice a week    Attends Religious Services: Never    Database administrator or Organizations: No    Attends Banker Meetings: Never    Marital Status: Living with partner  Intimate Partner Violence: Not At Risk (08/21/2023)   Humiliation, Afraid, Rape, and Kick questionnaire    Fear of Current or Ex-Partner: No    Emotionally Abused: No    Physically Abused: No     Sexually Abused: No     FAMILY HISTORY:  Family History  Problem Relation Age of Onset   Kidney failure Mother    Congestive Heart Failure Father    Breast cancer Neg Hx     Otherwise negative.   REVIEW OF SYSTEMS:  Review of Systems  Constitutional:  Negative for chills and fever.  Respiratory:  Negative for cough and shortness of breath.   Cardiovascular:  Negative for chest pain and palpitations.  Gastrointestinal:  Positive for abdominal pain, nausea and vomiting. Negative for constipation and diarrhea.  Genitourinary:  Negative for dysuria and urgency.  All other systems reviewed and are negative.   VITAL SIGNS:  Temp:  [98.1 F (36.7 C)-98.2 F (36.8 C)] 98.1 F (36.7 C) (06/26 0820) Pulse Rate:  [56-106] 64 (06/26 0800) Resp:  [12-18] 12 (06/26 0800) BP: (113-132)/(55-73) 113/60 (06/26 0800) SpO2:  [87 %-99 %] 97 % (06/26 0800) Weight:  [78 kg] 78 kg (06/26 0356)     Height: 5' 6 (167.6 cm) Weight: 78 kg BMI (Calculated): 27.77   PHYSICAL EXAM:  Physical Exam Vitals and nursing note reviewed. Exam conducted with a chaperone present.  Constitutional:      General: She is not in acute distress.    Appearance: She is well-developed and normal weight.     Comments: Resting in bed; NAD  HENT:     Head: Normocephalic and atraumatic.   Eyes:     General: No scleral icterus.    Extraocular Movements: Extraocular movements intact.    Cardiovascular:     Rate and Rhythm: Normal rate.     Heart sounds: Normal heart sounds. No murmur heard. Pulmonary:     Effort: Pulmonary effort is normal. No respiratory distress.  Abdominal:     General: Abdomen is flat. A surgical scar is present. There is no distension.     Palpations: Abdomen is soft.     Tenderness: There is abdominal tenderness in the right upper quadrant and periumbilical area. There is no guarding or rebound. Positive signs include Murphy's sign.     Comments: Abdomen is soft, non-distended, she is  focally tender in RUQ, no rebound/guarding  Genitourinary:    Comments: Deferred  Skin:    General:  Skin is warm and dry.     Coloration: Skin is not jaundiced.   Neurological:     General: No focal deficit present.     Mental Status: She is alert and oriented to person, place, and time.   Psychiatric:        Mood and Affect: Mood normal.        Behavior: Behavior normal.     INTAKE/OUTPUT:  This shift: No intake/output data recorded.  Last 2 shifts: @IOLAST2SHIFTS @  Labs:     Latest Ref Rng & Units 05/07/2024    3:57 AM 11/14/2023    8:34 AM 08/22/2023    4:40 AM  CBC  WBC 4.0 - 10.5 K/uL 8.5  4.7  3.5   Hemoglobin 12.0 - 15.0 g/dL 87.5  86.0  89.3   Hematocrit 36.0 - 46.0 % 37.6  41.8  32.1   Platelets 150 - 400 K/uL 177  221  162       Latest Ref Rng & Units 05/07/2024    3:57 AM 11/14/2023    8:34 AM 08/22/2023    4:40 AM  CMP  Glucose 70 - 99 mg/dL 860  96  92   BUN 8 - 23 mg/dL 8  13  <5   Creatinine 0.44 - 1.00 mg/dL 9.31  9.15  9.24   Sodium 135 - 145 mmol/L 134  138  138   Potassium 3.5 - 5.1 mmol/L 2.7  3.7  3.3   Chloride 98 - 111 mmol/L 96  97  104   CO2 22 - 32 mmol/L 28  26  26    Calcium  8.9 - 10.3 mg/dL 8.6  9.5  8.3   Total Protein 6.5 - 8.1 g/dL 6.4  6.4  5.4   Total Bilirubin 0.0 - 1.2 mg/dL 1.2  0.5  0.8   Alkaline Phos 38 - 126 U/L 59  66  41   AST 15 - 41 U/L 297  22  13   ALT 0 - 44 U/L 90  12  10     Imaging studies:   RUQ US  (05/07/2024) personally reviewed with cholelithiasis, wall thickening, and pericholecystic fluid, and radiologist report reviewed below: IMPRESSION: Cholelithiasis with findings consistent with cholecystitis. Fatty infiltration of the liver.   CT Abdomen/Pelvis (05/07/2024) personally reviewed with changes concerning for cholecystitis, and radiologist report reviewed below: IMPRESSION: 1. Mild periportal edema in the liver. 2. Pericholecystic fluid is present. his could be related to acute inflammation. Right  upper quadrant ultrasound may be useful for further evaluation. 3. Periportal edema suggesting inflammatory changes in the liver. 4. Diverticular changes in the descending and sigmoid colon are noted without evidence of diverticulitis or bowel wall thickening.   Assessment/Plan: (ICD-10's: K81.0) 68 y.o. female with RUQ abdominal pain concerning for cholecystitis, also with hypokalemia and transaminitis     - We will plan to admit to general surgery  - Given LFT changes, we will get MRCP to ensure no choledocholithiasis  - We will plan on robotic assisted laparoscopic cholecystectomy tomorrow (06/27) with Dr Desiderio to allow for correction of electrolytes  - NPO for now; IF MRCP is reassuring we can do CLD and then NPO again at midnight  - IV Abx (Zosyn )  - Monitor abdominal examination  - Replete K+  - Pain control prn; antiemetics prn  - Mobilize    - DVT prophylaxis; hold for now   All of the above findings and recommendations were discussed with the patient and her family,  and all of their questions were answered to their expressed satisfaction.  -- Arthea Platt, PA-C [facility] 05/07/2024, 10:04 AM M-F: 7am - 4pm

## 2024-05-07 NOTE — ED Notes (Signed)
 Wells MD made aware K 2.7

## 2024-05-07 NOTE — ED Provider Notes (Signed)
 Care of this patient assumed from prior physician at 0700 pending US  and disposition. Please see prior physician note for further details.  Briefly, this is a 68 year old female who presented for evaluation of chest pain, right upper quadrant pain, right flank pain.  Labs notable for hypokalemia, mild transaminitis.  Chest pain workup reassuring.  CT with pericholecystic fluid, right upper quadrant ultrasound pending for further evaluation at time of signout.  9177 Right upper quadrant ultrasound concerning for cholecystitis.  Patient reassessed, remains with focal tenderness in her right upper quadrant.  Not on anticoagulation.  N.p.o. since 7 PM last night.  Case reviewed with PA Keren with general surgery.  He does recommend additional potassium repletion for the patient, okay with combined oral and IV repletion, and his team will evaluate the patient.  9056 Patient seen by PA Keren.  General surgery will admit the patient.  MRCP ordered for further evaluation.  Zosyn  ordered in the setting of her cholecystitis.   Levander Slate, MD 05/07/24 [phone]

## 2024-05-08 ENCOUNTER — Other Ambulatory Visit: Payer: Self-pay

## 2024-05-08 ENCOUNTER — Observation Stay: Admitting: Registered Nurse

## 2024-05-08 ENCOUNTER — Encounter
Admission: EM | Disposition: A | Discharge status: Home / Self Care | Payer: Self-pay | Source: Home / Self Care | Attending: Emergency Medicine

## 2024-05-08 DIAGNOSIS — K810 Acute cholecystitis: Secondary | ICD-10-CM

## 2024-05-08 DIAGNOSIS — K8012 Calculus of gallbladder with acute and chronic cholecystitis without obstruction: Secondary | ICD-10-CM | POA: Diagnosis not present

## 2024-05-08 HISTORY — PX: CHOLECYSTECTOMY, ROBOT-ASSISTED, LAPAROSCOPIC: SHX7198

## 2024-05-08 LAB — COMPREHENSIVE METABOLIC PANEL WITH GFR
ALT: 173 U/L — ABNORMAL HIGH (ref 0–44)
AST: 180 U/L — ABNORMAL HIGH (ref 15–41)
Albumin: 2.9 g/dL — ABNORMAL LOW (ref 3.5–5.0)
Alkaline Phosphatase: 63 U/L (ref 38–126)
Anion gap: 6 (ref 5–15)
BUN: 7 mg/dL — ABNORMAL LOW (ref 8–23)
CO2: 27 mmol/L (ref 22–32)
Calcium: 8.1 mg/dL — ABNORMAL LOW (ref 8.9–10.3)
Chloride: 105 mmol/L (ref 98–111)
Creatinine, Ser: 0.67 mg/dL (ref 0.44–1.00)
GFR, Estimated: >60 mL/min (ref >60)
Glucose, Bld: 97 mg/dL (ref 70–99)
Potassium: 3.2 mmol/L — ABNORMAL LOW (ref 3.5–5.1)
Sodium: 138 mmol/L (ref 135–145)
Total Bilirubin: 1.4 mg/dL — ABNORMAL HIGH (ref 0.0–1.2)
Total Protein: 5.2 g/dL — ABNORMAL LOW (ref 6.5–8.1)

## 2024-05-08 LAB — CBC
HCT: 32.9 % — ABNORMAL LOW (ref 36.0–46.0)
Hemoglobin: 10.8 g/dL — ABNORMAL LOW (ref 12.0–15.0)
MCH: 30 pg (ref 26.0–34.0)
MCHC: 32.8 g/dL (ref 30.0–36.0)
MCV: 91.4 fL (ref 80.0–100.0)
Platelets: 130 10*3/uL — ABNORMAL LOW (ref 150–400)
RBC: 3.6 MIL/uL — ABNORMAL LOW (ref 3.87–5.11)
RDW: 13.2 % (ref 11.5–15.5)
WBC: 4.2 10*3/uL (ref 4.0–10.5)
nRBC: 0 % (ref 0.0–0.2)

## 2024-05-08 SURGERY — CHOLECYSTECTOMY, ROBOT-ASSISTED, LAPAROSCOPIC
Anesthesia: General

## 2024-05-08 MED ORDER — 0.9 % SODIUM CHLORIDE (POUR BTL) OPTIME
TOPICAL | Status: DC | PRN
  Administered 2024-05-08: 5 mL

## 2024-05-08 MED ORDER — DEXAMETHASONE SODIUM PHOSPHATE 10 MG/ML IJ SOLN
INTRAMUSCULAR | Status: DC | PRN
  Administered 2024-05-08: 10 mg via INTRAVENOUS

## 2024-05-08 MED ORDER — PROPOFOL 10 MG/ML IV BOLUS
INTRAVENOUS | Status: DC | PRN
  Administered 2024-05-08: 140 mg via INTRAVENOUS

## 2024-05-08 MED ORDER — AMOXICILLIN-POT CLAVULANATE 875-125 MG PO TABS: 1.0000 | ORAL_TABLET | Freq: Two times a day (BID) | ORAL | 0 refills | Status: AC

## 2024-05-08 MED ORDER — POTASSIUM CHLORIDE CRYS ER 20 MEQ PO TBCR
40.0000 meq | EXTENDED_RELEASE_TABLET | Freq: Once | ORAL | Status: AC
  Administered 2024-05-08: 40 meq via ORAL
  Filled 2024-05-08: qty 2

## 2024-05-08 MED ORDER — ACETAMINOPHEN 500 MG PO TABS: 1000.0000 mg | ORAL_TABLET | Freq: Four times a day (QID) | ORAL | Status: DC | PRN

## 2024-05-08 MED ORDER — FENTANYL CITRATE (PF) 100 MCG/2ML IJ SOLN
INTRAMUSCULAR | Status: AC
  Filled 2024-05-08: qty 2

## 2024-05-08 MED ORDER — FENTANYL CITRATE (PF) 100 MCG/2ML IJ SOLN
25.0000 ug | INTRAMUSCULAR | Status: DC | PRN
  Administered 2024-05-08 (×4): 25 ug via INTRAVENOUS

## 2024-05-08 MED ORDER — OXYCODONE HCL 5 MG PO TABS: 5.0000 mg | ORAL_TABLET | ORAL | 0 refills | Status: DC | PRN

## 2024-05-08 MED ORDER — BUPIVACAINE-EPINEPHRINE (PF) 0.25% -1:200000 IJ SOLN
INTRAMUSCULAR | Status: DC | PRN
  Administered 2024-05-08: 40 mL

## 2024-05-08 MED ORDER — ONDANSETRON HCL 4 MG/2ML IJ SOLN
INTRAMUSCULAR | Status: DC | PRN
  Administered 2024-05-08: 4 mg via INTRAVENOUS

## 2024-05-08 MED ORDER — SUGAMMADEX SODIUM 200 MG/2ML IV SOLN
INTRAVENOUS | Status: DC | PRN
  Administered 2024-05-08: 200 mg via INTRAVENOUS

## 2024-05-08 MED ORDER — HYDROMORPHONE HCL 1 MG/ML IJ SOLN: 0.5000 mg | INTRAMUSCULAR | Status: DC | PRN

## 2024-05-08 MED ORDER — EPHEDRINE SULFATE-NACL 50-0.9 MG/10ML-% IV SOSY
PREFILLED_SYRINGE | INTRAVENOUS | Status: DC | PRN
  Administered 2024-05-08: 5 mg via INTRAVENOUS

## 2024-05-08 MED ORDER — LIDOCAINE HCL (PF) 2 % IJ SOLN
INTRAMUSCULAR | Status: AC
Start: 2024-05-08 — End: 2024-05-08
  Filled 2024-05-08: qty 5

## 2024-05-08 MED ORDER — ONDANSETRON HCL 4 MG/2ML IJ SOLN: 4.0000 mg | Freq: Once | INTRAMUSCULAR | Status: DC | PRN

## 2024-05-08 MED ORDER — BUPIVACAINE LIPOSOME 1.3 % IJ SUSP
INTRAMUSCULAR | Status: AC
  Filled 2024-05-08: qty 10

## 2024-05-08 MED ORDER — OXYCODONE HCL 5 MG/5ML PO SOLN: 5.0000 mg | Freq: Once | ORAL | Status: AC | PRN

## 2024-05-08 MED ORDER — FENTANYL CITRATE (PF) 100 MCG/2ML IJ SOLN
INTRAMUSCULAR | Status: DC | PRN
  Administered 2024-05-08: 50 ug via INTRAVENOUS
  Administered 2024-05-08 (×2): 25 ug via INTRAVENOUS

## 2024-05-08 MED ORDER — LIDOCAINE HCL (CARDIAC) PF 100 MG/5ML IV SOSY
PREFILLED_SYRINGE | INTRAVENOUS | Status: DC | PRN
  Administered 2024-05-08: 80 mg via INTRAVENOUS

## 2024-05-08 MED ORDER — ROCURONIUM BROMIDE 100 MG/10ML IV SOLN
INTRAVENOUS | Status: DC | PRN
  Administered 2024-05-08: 50 mg via INTRAVENOUS

## 2024-05-08 MED ORDER — KETOROLAC TROMETHAMINE 30 MG/ML IJ SOLN
INTRAMUSCULAR | Status: DC | PRN
  Administered 2024-05-08: 15 mg via INTRAVENOUS

## 2024-05-08 MED ORDER — KETOROLAC TROMETHAMINE 30 MG/ML IJ SOLN
INTRAMUSCULAR | Status: AC
  Filled 2024-05-08: qty 1

## 2024-05-08 MED ORDER — KETOROLAC TROMETHAMINE 30 MG/ML IJ SOLN
15.0000 mg | Freq: Four times a day (QID) | INTRAMUSCULAR | Status: DC
  Administered 2024-05-08 – 2024-05-09 (×3): 15 mg via INTRAVENOUS
  Filled 2024-05-08 (×3): qty 1

## 2024-05-08 MED ORDER — PROPOFOL 10 MG/ML IV BOLUS
INTRAVENOUS | Status: AC
  Filled 2024-05-08: qty 20

## 2024-05-08 MED ORDER — INDOCYANINE GREEN 25 MG IV SOLR
INTRAVENOUS | Status: AC
  Filled 2024-05-08: qty 10

## 2024-05-08 MED ORDER — OXYCODONE HCL 5 MG PO TABS
ORAL_TABLET | ORAL | Status: AC
  Filled 2024-05-08: qty 1

## 2024-05-08 MED ORDER — IBUPROFEN 600 MG PO TABS
600.0000 mg | ORAL_TABLET | Freq: Three times a day (TID) | ORAL | 1 refills | Status: DC | PRN
Start: 2024-05-08 — End: 2024-09-28

## 2024-05-08 MED ORDER — LACTATED RINGERS IV SOLN: INTRAVENOUS | Status: DC | PRN

## 2024-05-08 MED ORDER — BUPIVACAINE-EPINEPHRINE (PF) 0.25% -1:200000 IJ SOLN
INTRAMUSCULAR | Status: AC
  Filled 2024-05-08: qty 30

## 2024-05-08 MED ORDER — OXYCODONE HCL 5 MG PO TABS
5.0000 mg | ORAL_TABLET | Freq: Once | ORAL | Status: AC | PRN
  Administered 2024-05-08: 5 mg via ORAL

## 2024-05-08 SURGICAL SUPPLY — 37 items
BAG PRESSURE INF REUSE 1000 (BAG) IMPLANT
CANNULA CAP OBTURATR AIRSEAL 8 (CAP) IMPLANT
CAUTERY HOOK MNPLR 1.6 DVNC XI (INSTRUMENTS) ×1 IMPLANT
CLIP LIGATING HEMO O LOK GREEN (MISCELLANEOUS) ×1 IMPLANT
DERMABOND ADVANCED .7 DNX12 (GAUZE/BANDAGES/DRESSINGS) ×1 IMPLANT
DRAPE ARM DVNC X/XI (DISPOSABLE) ×4 IMPLANT
DRAPE COLUMN DVNC XI (DISPOSABLE) ×1 IMPLANT
ELECTRODE CAUTERY BLDE TIP 2.5 (TIP) ×1 IMPLANT
ELECTRODE REM PT RTRN 9FT ADLT (ELECTROSURGICAL) ×1 IMPLANT
FORCEPS BPLR R/ABLATION 8 DVNC (INSTRUMENTS) ×1 IMPLANT
FORCEPS PROGRASP DVNC XI (FORCEP) ×1 IMPLANT
GLOVE SURG SYN 7.0 (GLOVE) ×2 IMPLANT
GLOVE SURG SYN 7.0 PF PI (GLOVE) ×2 IMPLANT
GLOVE SURG SYN 7.5 E (GLOVE) ×2 IMPLANT
GLOVE SURG SYN 7.5 PF PI (GLOVE) ×2 IMPLANT
GOWN STRL REUS W/ TWL LRG LVL3 (GOWN DISPOSABLE) ×4 IMPLANT
GRASPER SUT TROCAR 14GX15 (MISCELLANEOUS) IMPLANT
IRRIGATOR SUCT 8 DISP DVNC XI (IRRIGATION / IRRIGATOR) IMPLANT
IV NS 1000ML BAXH (IV SOLUTION) IMPLANT
KIT PINK PAD W/HEAD ARM REST (MISCELLANEOUS) ×1 IMPLANT
LABEL OR SOLS (LABEL) ×1 IMPLANT
MANIFOLD NEPTUNE II (INSTRUMENTS) ×1 IMPLANT
NDL HYPO 22X1.5 SAFETY MO (MISCELLANEOUS) ×1 IMPLANT
NEEDLE HYPO 22X1.5 SAFETY MO (MISCELLANEOUS) ×1 IMPLANT
NS IRRIG 500ML POUR BTL (IV SOLUTION) ×1 IMPLANT
OBTURATOR OPTICALSTD 8 DVNC (TROCAR) ×1 IMPLANT
PACK LAP CHOLECYSTECTOMY (MISCELLANEOUS) ×1 IMPLANT
SEAL UNIV 5-12 XI (MISCELLANEOUS) ×4 IMPLANT
SET TUBE FILTERED XL AIRSEAL (SET/KITS/TRAYS/PACK) IMPLANT
SET TUBE SMOKE EVAC HIGH FLOW (TUBING) ×1 IMPLANT
SOLUTION ELECTROSURG ANTI STCK (MISCELLANEOUS) ×1 IMPLANT
SPIKE FLUID TRANSFER (MISCELLANEOUS) ×1 IMPLANT
SUT MNCRL AB 4-0 PS2 18 (SUTURE) ×1 IMPLANT
SUT VIC AB 3-0 SH 27X BRD (SUTURE) IMPLANT
SUT VICRYL 0 UR6 27IN ABS (SUTURE) ×2 IMPLANT
SYSTEM BAG RETRIEVAL 10MM (BASKET) ×1 IMPLANT
WATER STERILE IRR 500ML POUR (IV SOLUTION) ×1 IMPLANT

## 2024-05-08 NOTE — Anesthesia Procedure Notes (Signed)
 Procedure Name: Intubation Date/Time: 05/08/2024 1:10 PM  Performed by: Erie Jyl MATSU, CRNAPre-anesthesia Checklist: Patient identified, Patient being monitored, Timeout performed, Emergency Drugs available and Suction available Patient Re-evaluated:Patient Re-evaluated prior to induction Oxygen Delivery Method: Circle system utilized Preoxygenation: Pre-oxygenation with 100% oxygen Induction Type: IV induction Ventilation: Mask ventilation without difficulty Laryngoscope Size: 3 and McGrath Grade View: Grade I Tube type: Oral Tube size: 7.0 mm Number of attempts: 1 Airway Equipment and Method: Stylet Placement Confirmation: ETT inserted through vocal cords under direct vision, positive ETCO2 and breath sounds checked- equal and bilateral Secured at: 21 cm Tube secured with: Tape Dental Injury: Teeth and Oropharynx as per pre-operative assessment

## 2024-05-08 NOTE — Care Management Obs Status (Signed)
 MEDICARE OBSERVATION STATUS NOTIFICATION   Patient Details  Name: Tracy Chase MRN: [id number] Date of Birth: February 13, 1956   Medicare Observation Status Notification Given:  Yes    Rojelio SHAUNNA Rattler 05/08/2024, 11:00 AM

## 2024-05-08 NOTE — Transfer of Care (Signed)
 Immediate Anesthesia Transfer of Care Note  Patient: Tracy Chase  Procedure(s) Performed: CHOLECYSTECTOMY, ROBOT-ASSISTED, LAPAROSCOPIC  Patient Location: PACU  Anesthesia Type:General  Level of Consciousness: awake, alert , and oriented  Airway & Oxygen Therapy: Patient Spontanous Breathing  Post-op Assessment: Report given to RN and Post -op Vital signs reviewed and stable  Post vital signs: Reviewed and stable  Last Vitals:  Vitals Value Taken Time  BP 89/50 05/08/24 14:57  Temp    Pulse    Resp 15 05/08/24 14:59  SpO2    Vitals shown include unfiled device data.  Last Pain:  Vitals:   05/08/24 1204  TempSrc: Temporal  PainSc: 2          Complications: No notable events documented.

## 2024-05-08 NOTE — Plan of Care (Signed)

## 2024-05-08 NOTE — Discharge Instructions (Signed)
In addition to included general post-operative instructions,  Diet: Resume home diet. Recommend avoiding or limiting fatty/greasy foods over the next few days/week. If you do eat these, you may (or may not) notice diarrhea. This is expected while your body adjusts to not having a gallbladder, and it typically resolves with time.    Activity: No heavy lifting >20 pounds (children, pets, laundry, garbage) or strenuous activity for 4 weeks, but light activity and walking are encouraged. Do not drive or drink alcohol if taking narcotic pain medications or having pain that might distract from driving.  Wound care: You may shower/get incision wet with soapy water and pat dry (do not rub incisions), but no baths or submerging incision underwater until follow-up.   Medications: Resume all home medications. For mild to moderate pain: acetaminophen (Tylenol) or ibuprofen/naproxen (if no kidney disease). Combining Tylenol with alcohol can substantially increase your risk of causing liver disease. Narcotic pain medications, if prescribed, can be used for severe pain, though may cause nausea, constipation, and drowsiness. Do not combine Tylenol and Percocet (or similar) within a 6 hour period as Percocet (and similar) contain(s) Tylenol. If you do not need the narcotic pain medication, you do not need to fill the prescription.  Call office [phone] / [phone]) at any time if any questions, worsening pain, fevers/chills, bleeding, drainage from incision site, or other concerns.

## 2024-05-08 NOTE — Op Note (Signed)
 Procedure Date:  05/08/2024  Pre-operative Diagnosis:  Acute cholecystitis  Post-operative Diagnosis: Acute cholecystitis  Procedure:  Robotic assisted cholecystectomy with ICG FireFly cholangiogram  Surgeon:  Aloysius [name], MD  Anesthesia:  General endotracheal  Estimated Blood Loss:  10 ml  Specimens:  gallbladder  Complications:  None  Indications for Procedure:  This is a 68 y.o. female who presents with abdominal pain and workup revealing acute cholecystitis.  The benefits, complications, treatment options, and expected outcomes were discussed with the patient. The risks of bleeding, infection, recurrence of symptoms, failure to resolve symptoms, bile duct damage, bile duct leak, retained common bile duct stone, bowel injury, and need for further procedures were all discussed with the patient and she was willing to proceed.  Description of Procedure: The patient was correctly identified in the preoperative area and brought into the operating room.  The patient was placed supine with VTE prophylaxis in place.  Appropriate time-outs were performed.  Anesthesia was induced and the patient was intubated.  Appropriate antibiotics were infused.  The abdomen was prepped and draped in a sterile fashion. Due to the patient's prior ventral hernia repair, a Veress needle was introduced in the left upper quadrant and pneumoperitoneum was obtained with appropriate pressures.  Using Optiview technique, an 8 mm port was introduced in the left lateral abdominal wall without complications.  The patient had adhesions of small bowel and omentum to the anterior abdominal wall , but we had space to place our remaining ports.  Two 8-mm ports were placed in the right abdomen, and a 12 mm port was placed in right paramedial location to be away from the adhesions.  The DaVinci platform was docked, camera targeted, and instruments were placed under direct visualization.  Some of the adhesions were partially  obstructing the path of our left lateral instrument.  These adhesions were taken down partially with both blunt dissection and cautery.  We did not attempt dissection of the small bowel adhesions to avoid any injury, and these were not in the way of our instruments.  The gallbladder was identified.  The fundus was grasped and retracted cephalad.  Adhesions were lysed bluntly and with electrocautery. The infundibulum was grasped and retracted laterally, exposing the peritoneum overlying the gallbladder.  The gallbladder had edematous changes particularly near the neck and infundibulum.  The peritoneum was incised with electrocautery and extended on either side of the gallbladder.  FireFly cholangiogram was then obtained, and we were able to clearly identify the  common bile duct only, consistent with acute cholecystitis.  The cystic duct and cystic artery were carefully dissected with combination of cautery and blunt dissection.  Both were clipped twice proximally and once distally, cutting in between.  The gallbladder was taken from the gallbladder fossa in a retrograde fashion with electrocautery. The gallbladder was placed in an Endocatch bag. The liver bed was inspected and any bleeding was controlled with electrocautery. The right upper quadrant was then inspected again revealing intact clips, no bleeding, and no ductal injury.  The area was thoroughly irrigated.  The 12 mm port was removed and the Endocatch bag was retrieved. PMI was used to close the 12 mm port using a 0 Vicryl suture without any injury.  The 8 mm ports were removed under direct visualization.  Local anesthetic was infused in all incisions and the incisions were closed with 3-0 Vicryl and 4-0 Monocryl.  The wounds were cleaned and sealed with DermaBond.  The patient was emerged from anesthesia and  extubated and brought to the recovery room for further management.  The patient tolerated the procedure well and all counts were correct at  the end of the case.   Aloysius [name], MD

## 2024-05-08 NOTE — Progress Notes (Signed)
 [facility] SURGICAL PROGRESS NOTE  Hospital Day(s): 0.   Interval History: Patient seen and examined, no acute events or new complaints overnight. Patient reports she continues to have RUQ pain, radiates to her back. No significant change in last 24 hours. No fever, chills, nausea, emesis. She remains without leukocytosis; WBC 4.2K. Hgb to 10.8. Renal function normal; sCr - 0.67; UO - unmeasured. Mild hypokalemia to 3.2. AST/ALT improving. Bilirubin improved as well; 1.4. NPO this AM  Review of Systems:  Constitutional: denies fever, chills  HEENT: denies cough or congestion  Respiratory: denies any shortness of breath  Cardiovascular: denies chest pain or palpitations  Gastrointestinal: + abdominal pain, denied nausea/emesis  Genitourinary: denies burning with urination or urinary frequency Musculoskeletal: denies pain, decreased motor or sensation  Vital signs in last 24 hours: [min-max] current  Temp:  [98.1 F (36.7 C)-99.1 F (37.3 C)] 98.4 F (36.9 C) (06/27 0400) Pulse Rate:  [60-106] 60 (06/27 0512) Resp:  [12-18] 14 (06/27 0400) BP: (90-123)/(47-73) 112/59 (06/27 0512) SpO2:  [87 %-99 %] 94 % (06/27 0400)     Height: 5' 6 (167.6 cm) Weight: 78 kg BMI (Calculated): 27.77   Intake/Output last 2 shifts:  06/26 0701 - 06/27 0700 In: 1540.2 [P.O.:60; I.V.:1185.2; IV Piggyback:295] Out: -    Physical Exam:  Constitutional: alert, cooperative and no distress  HENT: normocephalic without obvious abnormality  Eyes: PERRL, EOM's grossly intact and symmetric  Respiratory: breathing non-labored at rest  Cardiovascular: regular rate and sinus rhythm  Gastrointestinal: soft, continued RUQ pain, and non-distended Musculoskeletal: no edema or wounds, motor and sensation grossly intact, NT    Labs:     Latest Ref Rng & Units 05/08/2024    3:52 AM 05/07/2024    3:57 AM 11/14/2023    8:34 AM  CBC  WBC 4.0 - 10.5 K/uL 4.2  8.5  4.7   Hemoglobin 12.0 - 15.0 g/dL  89.1  87.5  86.0   Hematocrit 36.0 - 46.0 % 32.9  37.6  41.8   Platelets 150 - 400 K/uL 130  177  221       Latest Ref Rng & Units 05/08/2024    3:52 AM 05/07/2024    2:08 PM 05/07/2024    3:57 AM  CMP  Glucose 70 - 99 mg/dL 97  897  860   BUN 8 - 23 mg/dL 7  6  8    Creatinine 0.44 - 1.00 mg/dL 9.32  9.37  9.31   Sodium 135 - 145 mmol/L 138  135  134   Potassium 3.5 - 5.1 mmol/L 3.2  3.5  2.7   Chloride 98 - 111 mmol/L 105  100  96   CO2 22 - 32 mmol/L 27  24  28    Calcium  8.9 - 10.3 mg/dL 8.1  8.3  8.6   Total Protein 6.5 - 8.1 g/dL 5.2  5.6  6.4   Total Bilirubin 0.0 - 1.2 mg/dL 1.4  1.7  1.2   Alkaline Phos 38 - 126 U/L 63  75  59   AST 15 - 41 U/L 180  478  297   ALT 0 - 44 U/L 173  253  90     Imaging studies: No new pertinent imaging studies   Assessment/Plan:  68 y.o. female with RUQ abdominal pain concerning for cholecystitis, also with improving hypokalemia and improving transaminitis     - We will plan on robotic assisted laparoscopic cholecystectomy this afternoon with Dr Desiderio  -  All risks, benefits, and alternatives to above procedure(s) were discussed with the patient, all of her questions were answered to her expressed satisfaction, patient expresses she wishes to proceed, and informed consent was obtained.              - NPO for planned procedure  - ICG on call to OR             - IV Abx (Zosyn )             - Monitor abdominal examination             - Replete K+; monitor              - Pain control prn; antiemetics prn             - Mobilize               - DVT prophylaxis; hold for now    All of the above findings and recommendations were discussed with the patient, and the medical team, and all of patient's questions were answered to her expressed satisfaction.  -- Arthea Platt, PA-C [facility] 05/08/2024, 7:16 AM M-F: 7am - 4pm

## 2024-05-08 NOTE — Care Management Important Message (Signed)
 Important Message  Patient Details  Name: Tracy Chase MRN: [id number] Date of Birth: 04/25/1956   Important Message Given:  Yes - Medicare IM     Tracy Chase 05/08/2024, 11:00 AM

## 2024-05-08 NOTE — Anesthesia Preprocedure Evaluation (Addendum)
 Anesthesia Evaluation  Patient identified by MRN, date of birth, ID band Patient awake    Reviewed: Allergy & Precautions, NPO status , Patient's Chart, lab work & pertinent test results  Airway Mallampati: II  TM Distance: >3 FB Neck ROM: full    Dental  (+) Teeth Intact   Pulmonary former smoker   Pulmonary exam normal breath sounds clear to auscultation       Cardiovascular Exercise Tolerance: Good hypertension, Pt. on medications Normal cardiovascular exam Rhythm:Regular Rate:Normal     Neuro/Psych    Depression    negative neurological ROS  negative psych ROS   GI/Hepatic negative GI ROS, Neg liver ROS, hiatal hernia,GERD  Medicated,,  Endo/Other  negative endocrine ROS    Renal/GU negative Renal ROS  negative genitourinary   Musculoskeletal   Abdominal Normal abdominal exam  (+)   Peds negative pediatric ROS (+)  Hematology negative hematology ROS (+)   Anesthesia Other Findings Past Medical History: No date: Anemia     Comment:  h/o No date: Aortic atherosclerosis (HCC) No date: Arthritis No date: Cancer [facility])     Comment:  RENAL CELL No date: Depression 05/31/2023: Diverticulitis of small intestine with perforation and  abscess No date: GERD (gastroesophageal reflux disease)     Comment:  OCC NO MEDS No date: Hypertension No date: Incisional hernia No date: Insomnia No date: Rib lesion     Comment:  3 x 2.1 x 2 cm expansile right fourth rib posteriorly No date: Sepsis [facility])  Past Surgical History: No date: ABDOMINAL HYSTERECTOMY 2011: APPENDECTOMY     Comment:  Lap converted to exlap appendectomy, drainage of abd               abscesses No date: CESAREAN SECTION 08/28/2019: COLONOSCOPY WITH PROPOFOL ; N/A     Comment:  Procedure: COLONOSCOPY WITH PROPOFOL ;  Surgeon:               Janalyn Keene NOVAK, MD;  Location: [facility] ENDOSCOPY;                Service: Endoscopy;  Laterality:  N/A; 08/21/2023: COLONOSCOPY WITH PROPOFOL ; N/A     Comment:  Procedure: COLONOSCOPY WITH PROPOFOL ;  Surgeon: Therisa Bi, MD;  Location: [facility] ENDOSCOPY;  Service:               Gastroenterology;  Laterality: N/A; 2012: cryoablation for renal cell carcinoma 08/21/2023: ESOPHAGOGASTRODUODENOSCOPY (EGD) WITH PROPOFOL ; N/A     Comment:  Procedure: ESOPHAGOGASTRODUODENOSCOPY (EGD) WITH               PROPOFOL ;  Surgeon: Therisa Bi, MD;  Location: [facility]               ENDOSCOPY;  Service: Gastroenterology;  Laterality: N/A; 09/06/2020: ETHMOIDECTOMY; Left     Comment:  Procedure: TOTAL ETHMOIDECTOMY;  Surgeon: Blair Mt,              MD;  Location: [facility]TR;  Service: ENT;                Laterality: Left; 09/06/2020: FRONTAL SINUS EXPLORATION; Left     Comment:  Procedure: FRONTAL SINUS EXPLORATION;  Surgeon: Blair Mt, MD;  Location: [facility];  Service: ENT;               Laterality: Left; 09/06/2020: IMAGE  GUIDED SINUS SURGERY; N/A     Comment:  Procedure: IMAGE GUIDED SINUS SURGERY;  Surgeon:               Blair Mt, MD;  Location: [facility];                Service: ENT;  Laterality: N/A;  need [employer] disk PLACE              DISK ON OR CHARGE NURSE DESK 10-01 KP 01/10/2023: INSERTION OF MESH     Comment:  Procedure: INSERTION OF MESH;  Surgeon: Desiderio Schanz,               MD;  Location: [facility] ORS;  Service: General;; No date: JOINT REPLACEMENT; Left     Comment:  partial knee replacement 1994: LUMBAR FUSION 09/06/2020: MAXILLARY ANTROSTOMY; Left     Comment:  Procedure: MAXILLARY ANTROSTOMY WITH TISSUE;  Surgeon:               Blair Mt, MD;  Location: [facility] SURGERY CNTR;                Service: ENT;  Laterality: Left; No date: MEDIAL PARTIAL KNEE REPLACEMENT; Left 09/06/2020: SPHENOIDECTOMY; Left     Comment:  Procedure: SPHENOIDECTOMY;  Surgeon: Blair Mt, MD;               Location: [facility];   Service: ENT;                Laterality: Left; No date: TONSILLECTOMY 02/21/2021: TOTAL KNEE REVISION; Left     Comment:  Procedure: Revision, left partial to total knee;                Surgeon: Kathlynn Sharper, MD;  Location: [facility] ORS;                Service: Orthopedics;  Laterality: Left;  CHRIS GAINES TO              ASSIST 2012: VENTRAL HERNIA REPAIR     Comment:  open ventral hernia repair with mesh 01/10/2023: XI ROBOTIC ASSISTED VENTRAL HERNIA; N/A     Comment:  Procedure: XI ROBOTIC ASSISTED VENTRAL HERNIA WITH LYSIS              OF ADHESIONS;  Surgeon: Desiderio Schanz, MD;  Location:               [facility] ORS;  Service: General;  Laterality: N/A;  BMI    Body Mass Index: 27.44 kg/m      Reproductive/Obstetrics negative OB ROS                              Anesthesia Physical Anesthesia Plan  ASA: 2  Anesthesia Plan: General   Post-op Pain Management:    Induction: Intravenous  PONV Risk Score and Plan: Ondansetron , Dexamethasone , Midazolam  and Treatment may vary due to age or medical condition  Airway Management Planned: Oral ETT  Additional Equipment:   Intra-op Plan:   Post-operative Plan: Extubation in OR  Informed Consent: I have reviewed the patients History and Physical, chart, labs and discussed the procedure including the risks, benefits and alternatives for the proposed anesthesia with the patient or authorized representative who has indicated his/her understanding and acceptance.     Dental Advisory Given  Plan Discussed with: CRNA  Anesthesia Plan Comments:         Anesthesia Quick  Evaluation

## 2024-05-09 DIAGNOSIS — K810 Acute cholecystitis: Secondary | ICD-10-CM | POA: Diagnosis not present

## 2024-05-09 LAB — BASIC METABOLIC PANEL WITH GFR
Anion gap: 6 (ref 5–15)
BUN: 7 mg/dL — ABNORMAL LOW (ref 8–23)
CO2: 24 mmol/L (ref 22–32)
Calcium: 8.6 mg/dL — ABNORMAL LOW (ref 8.9–10.3)
Chloride: 105 mmol/L (ref 98–111)
Creatinine, Ser: 0.64 mg/dL (ref 0.44–1.00)
GFR, Estimated: >60 mL/min (ref >60)
Glucose, Bld: 117 mg/dL — ABNORMAL HIGH (ref 70–99)
Potassium: 5 mmol/L (ref 3.5–5.1)
Sodium: 135 mmol/L (ref 135–145)

## 2024-05-09 NOTE — Plan of Care (Signed)
  Problem: Education: Goal: Knowledge of General Education information will improve Description: Including pain rating scale, medication(s)/side effects and non-pharmacologic comfort measures 05/09/2024 0955 by Les Delon CROME, RN Outcome: Adequate for Discharge 05/09/2024 0954 by Les Delon CROME, RN Outcome: Progressing   Problem: Health Behavior/Discharge Planning: Goal: Ability to manage health-related needs will improve 05/09/2024 0955 by Les Delon CROME, RN Outcome: Adequate for Discharge 05/09/2024 0954 by Les Delon CROME, RN Outcome: Progressing   Problem: Clinical Measurements: Goal: Ability to maintain clinical measurements within normal limits will improve Outcome: Adequate for Discharge Goal: Will remain free from infection Outcome: Adequate for Discharge Goal: Diagnostic test results will improve Outcome: Adequate for Discharge Goal: Respiratory complications will improve 05/09/2024 0955 by Les Delon CROME, RN Outcome: Adequate for Discharge 05/09/2024 0954 by Les Delon CROME, RN Outcome: Progressing Goal: Cardiovascular complication will be avoided Outcome: Adequate for Discharge   Problem: Activity: Goal: Risk for activity intolerance will decrease 05/09/2024 0955 by Les Delon CROME, RN Outcome: Adequate for Discharge 05/09/2024 [phone] by Les Delon CROME, RN Outcome: Progressing   Problem: Nutrition: Goal: Adequate nutrition will be maintained Outcome: Adequate for Discharge   Problem: Coping: Goal: Level of anxiety will decrease 05/09/2024 0955 by Les Delon CROME, RN Outcome: Adequate for Discharge 05/09/2024 0954 by Les Delon CROME, RN Outcome: Progressing   Problem: Elimination: Goal: Will not experience complications related to bowel motility Outcome: Adequate for Discharge Goal: Will not experience complications related to urinary retention Outcome: Adequate for Discharge   Problem: Pain  Managment: Goal: General experience of comfort will improve and/or be controlled 05/09/2024 0955 by Les Delon CROME, RN Outcome: Adequate for Discharge 05/09/2024 0954 by Les Delon CROME, RN Outcome: Progressing   Problem: Safety: Goal: Ability to remain free from injury will improve Outcome: Adequate for Discharge   Problem: Skin Integrity: Goal: Risk for impaired skin integrity will decrease Outcome: Adequate for Discharge

## 2024-05-09 NOTE — Plan of Care (Signed)
  Problem: Education: Goal: Knowledge of General Education information will improve Description: Including pain rating scale, medication(s)/side effects and non-pharmacologic comfort measures Outcome: Progressing   Problem: Health Behavior/Discharge Planning: Goal: Ability to manage health-related needs will improve Outcome: Progressing   Problem: Clinical Measurements: Goal: Respiratory complications will improve Outcome: Progressing   Problem: Activity: Goal: Risk for activity intolerance will decrease Outcome: Progressing   Problem: Coping: Goal: Level of anxiety will decrease Outcome: Progressing   Problem: Pain Managment: Goal: General experience of comfort will improve and/or be controlled Outcome: Progressing

## 2024-05-09 NOTE — Discharge Summary (Signed)
 Patient ID: Tracy Chase MRN: [id number] DOB/AGE: [birth year] 70 y.o.  Admit date: 05/07/2024 Discharge date: 05/09/2024   Discharge Diagnoses:  Principal Problem:   Cholecystitis   Procedures: Robotic assisted laparoscopic cholecystectomy  Hospital Course: Patient presented to the ER with right upper quadrant pain.  Imaging shows cholecystitis.  She was taken to the operation room for surgical management of cholecystitis.  She had a robotic assisted laparoscopic cholecystectomy.  She tolerated the procedure well.  This morning patient denies any significant pain.  Patient tolerating diet.  Patient ambulating.  Incisions are dry and clean.  Physical Exam Vitals reviewed.  HENT:     Head: Normocephalic.   Cardiovascular:     Rate and Rhythm: Normal rate and regular rhythm.  Pulmonary:     Effort: Pulmonary effort is normal.  Abdominal:     General: Abdomen is flat. Bowel sounds are normal. There is no distension.     Palpations: Abdomen is soft.   Skin:    General: Skin is warm.     Capillary Refill: Capillary refill takes less than 2 seconds.   Neurological:     General: No focal deficit present.     Mental Status: She is alert and oriented to person, place, and time.      Consults: None  Disposition: Discharge disposition: 01-Home or Self Care       Discharge Instructions     Call MD for:  difficulty breathing, headache or visual disturbances   Complete by: As directed    Call MD for:  persistant nausea and vomiting   Complete by: As directed    Call MD for:  redness, tenderness, or signs of infection (pain, swelling, redness, odor or green/yellow discharge around incision site)   Complete by: As directed    Call MD for:  severe uncontrolled pain   Complete by: As directed    Call MD for:  temperature >100.4   Complete by: As directed    Diet - low sodium heart healthy   Complete by: As directed    Diet general   Complete by: As directed    Regular  home diet as tolerated   Discharge instructions   Complete by: As directed    1.  Patient may shower, but do not scrub wounds heavily and dab dry only. 2.  Do not submerge wounds in pool/tub until fully healed. 3.  Do not apply ointments or hydrogen peroxide to the wounds. 4.  May apply ice packs to the wounds for comfort.   Driving Restrictions   Complete by: As directed    Do not drive while taking narcotics for pain control.  Prior to driving, make sure you are able to rotate right and left to look at blindspots without significant pain or discomfort.   Increase activity slowly   Complete by: As directed    Increase activity slowly   Complete by: As directed    Lifting restrictions   Complete by: As directed    No heavy lifting or pushing of more than 10-15 lbs for 4 weeks.   No dressing needed   Complete by: As directed       Allergies as of 05/09/2024       Reactions   Hydrocodone  Itching        Medication List     STOP taking these medications    amoxicillin  500 MG capsule Commonly known as: AMOXIL    Macrobid  100 MG capsule Generic drug: nitrofurantoin  (macrocrystal-monohydrate)  oxyCODONE -acetaminophen  5-325 MG tablet Commonly known as: PERCOCET/ROXICET       TAKE these medications    acetaminophen  500 MG tablet Commonly known as: TYLENOL  Take 2 tablets (1,000 mg total) by mouth every 6 (six) hours as needed for mild pain (pain score 1-3).   ALPRAZolam  0.25 MG tablet Commonly known as: XANAX  TAKE 1 TABLET BY MOUTH 2 TIMES DAILY AS NEEDED FOR ANXIETY.   ammonium lactate 12 % lotion Commonly known as: LAC-HYDRIN SMARTSIG:sparingly Topical Twice Daily   amoxicillin -clavulanate 875-125 MG tablet Commonly known as: AUGMENTIN  Take 1 tablet by mouth 2 (two) times daily for 7 days. What changed: when to take this   FLUoxetine  20 MG capsule Commonly known as: PROZAC  TAKE 1 CAPSULE BY MOUTH DAILY   gabapentin  300 MG capsule Commonly known as:  NEURONTIN  Take 300 mg by mouth 3 (three) times daily.   ibuprofen  600 MG tablet Commonly known as: ADVIL  Take 1 tablet (600 mg total) by mouth every 8 (eight) hours as needed for moderate pain (pain score 4-6).   oxyCODONE  5 MG immediate release tablet Commonly known as: Oxy IR/ROXICODONE  Take 1 tablet (5 mg total) by mouth every 4 (four) hours as needed for severe pain (pain score 7-10).   pantoprazole  40 MG tablet Commonly known as: PROTONIX  Take 1 tablet (40 mg total) by mouth daily.   rosuvastatin  5 MG tablet Commonly known as: Crestor  Take 1 tablet (5 mg total) by mouth daily.   traMADol  50 MG tablet Commonly known as: Ultram  Take 1 tablet (50 mg total) by mouth every 6 (six) hours as needed for moderate pain.   triamterene -hydrochlorothiazide  37.5-25 MG tablet Commonly known as: MAXZIDE -25 Take 1 tablet by mouth daily.   Trulance  3 MG Tabs Generic drug: Plecanatide  Take 1 tablet (3 mg total) by mouth daily.   zolpidem  10 MG tablet Commonly known as: AMBIEN  Take 1 tablet (10 mg total) by mouth at bedtime.               Discharge Care Instructions  (From admission, onward)           Start     Ordered   05/08/24 0000  No dressing needed        05/08/24 1549            Follow-up Information     Desiderio Schanz, MD. Go on 05/20/2024.   Specialty: General Surgery Why: Go to appointment on 07/09 at 1030 AM s/p laparoscopic cholecystectomy Contact information: [address] [place] [address] [phone]                This discharge encounter was for 35-minute most of the time counseling the patient and coordinating plan of care.

## 2024-05-09 NOTE — Progress Notes (Signed)
 Patient being discharged home.  Patient to be transported by her spouse.  IV removed with the catheter intact.  Discharge instructions and prescription information given to the patient and spouse who verbalized understanding.

## 2024-05-11 LAB — SURGICAL PATHOLOGY

## 2024-05-13 ENCOUNTER — Ambulatory Visit (INDEPENDENT_AMBULATORY_CARE_PROVIDER_SITE_OTHER): Payer: Self-pay | Admitting: Internal Medicine

## 2024-05-13 ENCOUNTER — Encounter: Payer: Self-pay | Admitting: Internal Medicine

## 2024-05-13 VITALS — BP 124/78 | HR 89 | Ht 66.0 in | Wt 168.0 lb

## 2024-05-13 DIAGNOSIS — I10 Essential (primary) hypertension: Secondary | ICD-10-CM | POA: Diagnosis not present

## 2024-05-13 DIAGNOSIS — F411 Generalized anxiety disorder: Secondary | ICD-10-CM

## 2024-05-13 DIAGNOSIS — E782 Mixed hyperlipidemia: Secondary | ICD-10-CM | POA: Diagnosis not present

## 2024-05-13 DIAGNOSIS — K819 Cholecystitis, unspecified: Secondary | ICD-10-CM | POA: Diagnosis not present

## 2024-05-13 MED ORDER — TRIAMTERENE-HCTZ 37.5-25 MG PO TABS: 1.0000 | ORAL_TABLET | Freq: Every day | ORAL | 3 refills | Status: AC

## 2024-05-13 NOTE — Assessment & Plan Note (Signed)
 Stable - exacerbated by caring for elderly father with frequent health issues. Continue Xanax  prn only and Prozac  daily.

## 2024-05-13 NOTE — Assessment & Plan Note (Signed)
 Blood pressure is well controlled.  Current medications maxzide . Will continue same regimen along with efforts to limit dietary sodium.

## 2024-05-13 NOTE — Assessment & Plan Note (Signed)
 Started last visit on Rosuvastatin . No side effects noted. Will repeat lipids and CMP

## 2024-05-13 NOTE — Progress Notes (Signed)
 Date:  05/13/2024   Name:  Tracy Chase   DOB:  Apr 27, 1956   MRN:  [id number]   Chief Complaint: Hypertension  Hypertension This is a chronic problem. Associated symptoms include anxiety. Pertinent negatives include no chest pain, headaches, palpitations or shortness of breath. Treatments tried: previously on Maxzide  but stopped it on her own.  Hyperlipidemia This is a chronic problem. Pertinent negatives include no chest pain, myalgias or shortness of breath. Current antihyperlipidemic treatment includes statins.  Anxiety Presents for follow-up visit. Patient reports no chest pain, dizziness, palpitations or shortness of breath. Symptoms occur most days. The severity of symptoms is mild. The quality of sleep is fair.   Compliance with medications is 76-100%.  Recent cholecystectomy - doing well, incisions healing.  No pain.  Did have very elevated LFTs that were trending down at discharge.   Review of Systems  Constitutional:  Negative for fatigue and unexpected weight change.  HENT:  Negative for trouble swallowing.   Eyes:  Negative for visual disturbance.  Respiratory:  Negative for cough, chest tightness, shortness of breath and wheezing.   Cardiovascular:  Negative for chest pain, palpitations and leg swelling.  Gastrointestinal:  Negative for abdominal pain, constipation and diarrhea.  Musculoskeletal:  Negative for arthralgias and myalgias.  Neurological:  Negative for dizziness, weakness, light-headedness and headaches.     Lab Results  Component Value Date   NA 135 05/09/2024   K 5.0 05/09/2024   CO2 24 05/09/2024   GLUCOSE 117 (H) 05/09/2024   BUN 7 (L) 05/09/2024   CREATININE 0.64 05/09/2024   CALCIUM  8.6 (L) 05/09/2024   EGFR 75 11/14/2023   GFRNONAA >60 05/09/2024   Lab Results  Component Value Date   CHOL 244 (H) 11/14/2023   HDL 104 11/14/2023   LDLCALC 126 (H) 11/14/2023   TRIG 81 11/14/2023   CHOLHDL 2.3 11/14/2023   Lab Results  Component Value  Date   TSH 3.070 11/14/2023   Lab Results  Component Value Date   HGBA1C 5.3 11/01/2021   Lab Results  Component Value Date   WBC 4.2 05/08/2024   HGB 10.8 (L) 05/08/2024   HCT 32.9 (L) 05/08/2024   MCV 91.4 05/08/2024   PLT 130 (L) 05/08/2024   Lab Results  Component Value Date   ALT 173 (H) 05/08/2024   AST 180 (H) 05/08/2024   ALKPHOS 63 05/08/2024   BILITOT 1.4 (H) 05/08/2024   No results found for: MARIEN BOLLS, VD25OH   Patient Active Problem List   Diagnosis Date Noted   Cholecystitis 05/07/2024   Hiatal hernia 11/14/2023   Constipation due to slow transit 08/27/2023   Acute pain of left shoulder 08/21/2023   Abnormal EKG 08/21/2023   Hemoperitoneum 08/21/2023   Recurrent incisional hernia 01/10/2023   Rib lesion 08/09/2022   Trigger index finger of right hand 11/01/2021   Atherosclerosis of aorta (HCC) 10/17/2021   S/P TKR (total knee replacement) using cement, left 02/21/2021   Mixed hyperlipidemia 09/25/2019   Colon cancer screening    Polyp of colon    Degenerative disc disease, cervical 11/18/2017   Generalized anxiety disorder 06/21/2015   History of renal cell cancer 06/21/2015   Carpal tunnel syndrome 06/21/2015   Essential (primary) hypertension 06/21/2015   Idiopathic insomnia 06/21/2015    Allergies  Allergen Reactions   Hydrocodone  Itching    Past Surgical History:  Procedure Laterality Date   ABDOMINAL HYSTERECTOMY     APPENDECTOMY  2011   Lap converted  to exlap appendectomy, drainage of abd abscesses   CESAREAN SECTION     CHOLECYSTECTOMY, ROBOT-ASSISTED, LAPAROSCOPIC  05/08/2024   COLONOSCOPY WITH PROPOFOL  N/A 08/28/2019   Procedure: COLONOSCOPY WITH PROPOFOL ;  Surgeon: Janalyn Keene NOVAK, MD;  Location: [facility] ENDOSCOPY;  Service: Endoscopy;  Laterality: N/A;   COLONOSCOPY WITH PROPOFOL  N/A 08/21/2023   Procedure: COLONOSCOPY WITH PROPOFOL ;  Surgeon: Therisa Bi, MD;  Location: [facility] ENDOSCOPY;  Service:  Gastroenterology;  Laterality: N/A;   cryoablation for renal cell carcinoma  2012   ESOPHAGOGASTRODUODENOSCOPY (EGD) WITH PROPOFOL  N/A 08/21/2023   Procedure: ESOPHAGOGASTRODUODENOSCOPY (EGD) WITH PROPOFOL ;  Surgeon: Therisa Bi, MD;  Location: [facility] ENDOSCOPY;  Service: Gastroenterology;  Laterality: N/A;   ETHMOIDECTOMY Left 09/06/2020   Procedure: TOTAL ETHMOIDECTOMY;  Surgeon: Blair Mt, MD;  Location: [facility];  Service: ENT;  Laterality: Left;   FRONTAL SINUS EXPLORATION Left 09/06/2020   Procedure: FRONTAL SINUS EXPLORATION;  Surgeon: Blair Mt, MD;  Location: [facility];  Service: ENT;  Laterality: Left;   IMAGE GUIDED SINUS SURGERY N/A 09/06/2020   Procedure: IMAGE GUIDED SINUS SURGERY;  Surgeon: Blair Mt, MD;  Location: [facility];  Service: ENT;  Laterality: N/A;  need stryker disk PLACE DISK ON OR CHARGE NURSE DESK 10-01 KP   INSERTION OF MESH  01/10/2023   Procedure: INSERTION OF MESH;  Surgeon: Desiderio Schanz, MD;  Location: [facility] ORS;  Service: General;;   JOINT REPLACEMENT Left    partial knee replacement   LUMBAR FUSION  1994   MAXILLARY ANTROSTOMY Left 09/06/2020   Procedure: MAXILLARY ANTROSTOMY WITH TISSUE;  Surgeon: Blair Mt, MD;  Location: [facility];  Service: ENT;  Laterality: Left;   MEDIAL PARTIAL KNEE REPLACEMENT Left    SPHENOIDECTOMY Left 09/06/2020   Procedure: SPHENOIDECTOMY;  Surgeon: Blair Mt, MD;  Location: [facility];  Service: ENT;  Laterality: Left;   TONSILLECTOMY     TOTAL KNEE REVISION Left 02/21/2021   Procedure: Revision, left partial to total knee;  Surgeon: Kathlynn Sharper, MD;  Location: [facility] ORS;  Service: Orthopedics;  Laterality: Left;  CHRIS GAINES TO ASSIST   VENTRAL HERNIA REPAIR  2012   open ventral hernia repair with mesh   XI ROBOTIC ASSISTED VENTRAL HERNIA N/A 01/10/2023   Procedure: XI ROBOTIC ASSISTED VENTRAL HERNIA WITH LYSIS OF ADHESIONS;  Surgeon: Desiderio Schanz, MD;   Location: [facility] ORS;  Service: General;  Laterality: N/A;    Social History   Tobacco Use   Smoking status: Former    Current packs/day: 0.00    Types: Cigarettes    Quit date: 1990    Years since quitting: 35.5    Passive exposure: Past   Smokeless tobacco: Never   Tobacco comments:    was social Smoker over 15 yrs ago  Vaping Use   Vaping status: Never Used  Substance Use Topics   Alcohol use: Yes    Comment: 2-3 times weekly   Drug use: No     Medication list has been reviewed and updated.  Current Meds  Medication Sig   acetaminophen  (TYLENOL ) 500 MG tablet Take 2 tablets (1,000 mg total) by mouth every 6 (six) hours as needed for mild pain (pain score 1-3).   ALPRAZolam  (XANAX ) 0.25 MG tablet TAKE 1 TABLET BY MOUTH 2 TIMES DAILY AS NEEDED FOR ANXIETY.   ammonium lactate (LAC-HYDRIN) 12 % lotion SMARTSIG:sparingly Topical Twice Daily   amoxicillin -clavulanate (AUGMENTIN ) 875-125 MG tablet Take 1 tablet by mouth 2 (two) times daily for 7 days.  FLUoxetine  (PROZAC ) 20 MG capsule TAKE 1 CAPSULE BY MOUTH DAILY   ibuprofen  (ADVIL ) 600 MG tablet Take 1 tablet (600 mg total) by mouth every 8 (eight) hours as needed for moderate pain (pain score 4-6).   oxyCODONE  (OXY IR/ROXICODONE ) 5 MG immediate release tablet Take 1 tablet (5 mg total) by mouth every 4 (four) hours as needed for severe pain (pain score 7-10).   pantoprazole  (PROTONIX ) 40 MG tablet Take 1 tablet (40 mg total) by mouth daily.   Plecanatide  (TRULANCE ) 3 MG TABS Take 1 tablet (3 mg total) by mouth daily.   rosuvastatin  (CRESTOR ) 5 MG tablet Take 1 tablet (5 mg total) by mouth daily.   traMADol  (ULTRAM ) 50 MG tablet Take 1 tablet (50 mg total) by mouth every 6 (six) hours as needed for moderate pain.   zolpidem  (AMBIEN ) 10 MG tablet Take 1 tablet (10 mg total) by mouth at bedtime.   [DISCONTINUED] triamterene -hydrochlorothiazide  (MAXZIDE -25) 37.5-25 MG tablet Take 1 tablet by mouth daily.       05/13/2024     8:23 AM 08/27/2023    1:51 PM 07/04/2023   11:32 AM 05/20/2023    1:24 PM  GAD 7 : Generalized Anxiety Score  Nervous, Anxious, on Edge 3 3 0 3  Control/stop worrying 3 3 0 3  Worry too much - different things 3 3 0 3  Trouble relaxing 3 3 0 0  Restless 3 3 0 0  Easily annoyed or irritable 3 3 1 2   Afraid - awful might happen 3 3 1  0  Total GAD 7 Score 21 21 2 11   Anxiety Difficulty Extremely difficult Extremely difficult Not difficult at all Not difficult at all       05/13/2024    8:23 AM 08/27/2023    1:51 PM 07/30/2023   10:12 AM  Depression screen PHQ 2/9  Decreased Interest 1 1 0  Down, Depressed, Hopeless 1 1 0  PHQ - 2 Score 2 2 0  Altered sleeping 1 0 0  Tired, decreased energy 3 2 0  Change in appetite 3 2 0  Feeling bad or failure about yourself  0 0 0  Trouble concentrating 0 0 0  Moving slowly or fidgety/restless 0 0 0  Suicidal thoughts 0 0 0  PHQ-9 Score 9 6 0  Difficult doing work/chores Not difficult at all Not difficult at all Not difficult at all    BP Readings from Last 3 Encounters:  05/13/24 124/78  05/09/24 (!) 146/69  11/14/23 124/76    Physical Exam Vitals and nursing note reviewed.  Constitutional:      General: She is not in acute distress.    Appearance: Normal appearance. She is well-developed.  HENT:     Head: Normocephalic and atraumatic.  Cardiovascular:     Rate and Rhythm: Normal rate and regular rhythm.     Heart sounds: No murmur heard. Pulmonary:     Effort: Pulmonary effort is normal. No respiratory distress.     Breath sounds: No wheezing or rhonchi.  Abdominal:     General: Abdomen is flat.     Palpations: Abdomen is soft.      Comments: Lap sites intact with skin adhesive; no bleeding or sign of infection  Musculoskeletal:     Cervical back: Normal range of motion.  Lymphadenopathy:     Cervical: No cervical adenopathy.  Skin:    General: Skin is warm and dry.     Findings: No rash.  Neurological:  Mental  Status: She is alert and oriented to person, place, and time.  Psychiatric:        Attention and Perception: Attention normal.        Mood and Affect: Mood normal.        Behavior: Behavior normal.        Cognition and Memory: Cognition normal.     Wt Readings from Last 3 Encounters:  05/13/24 168 lb (76.2 kg)  05/08/24 170 lb (77.1 kg)  11/14/23 171 lb (77.6 kg)    BP 124/78   Pulse 89   Ht 5' 6 (1.676 m)   Wt 168 lb (76.2 kg)   SpO2 100%   BMI 27.12 kg/m   Assessment and Plan:  Problem List Items Addressed This Visit       Unprioritized   Generalized anxiety disorder (Chronic)   Stable - exacerbated by caring for elderly father with frequent health issues. Continue Xanax  prn only and Prozac  daily.      Essential (primary) hypertension - Primary (Chronic)   Blood pressure is well controlled.  Current medications maxzide . Will continue same regimen along with efforts to limit dietary sodium.       Relevant Medications   triamterene -hydrochlorothiazide  (MAXZIDE -25) 37.5-25 MG tablet   Mixed hyperlipidemia (Chronic)   Started last visit on Rosuvastatin . No side effects noted. Will repeat lipids and CMP      Relevant Medications   triamterene -hydrochlorothiazide  (MAXZIDE -25) 37.5-25 MG tablet   Other Relevant Orders   Comprehensive metabolic panel with GFR   Lipid panel   Cholecystitis   S/p excision Doing well.  Repeat CMP       Return in about 6 months (around 11/13/2024) for CPX.    Leita HILARIO Adie, MD [facility] [place]

## 2024-05-13 NOTE — Assessment & Plan Note (Signed)
 S/p excision Doing well.  Repeat CMP

## 2024-05-14 ENCOUNTER — Ambulatory Visit: Payer: Self-pay | Admitting: Internal Medicine

## 2024-05-14 LAB — COMPREHENSIVE METABOLIC PANEL WITH GFR
ALT: 64 IU/L — ABNORMAL HIGH (ref 0–32)
AST: 33 IU/L (ref 0–40)
Albumin: 4.3 g/dL (ref 3.9–4.9)
Alkaline Phosphatase: 151 IU/L — ABNORMAL HIGH (ref 44–121)
BUN/Creatinine Ratio: 7 — ABNORMAL LOW (ref 12–28)
BUN: 5 mg/dL — ABNORMAL LOW (ref 8–27)
Bilirubin Total: 0.4 mg/dL (ref 0.0–1.2)
CO2: 22 mmol/L (ref 20–29)
Calcium: 9.9 mg/dL (ref 8.7–10.3)
Chloride: 99 mmol/L (ref 96–106)
Creatinine, Ser: 0.73 mg/dL (ref 0.57–1.00)
Globulin, Total: 2 g/dL (ref 1.5–4.5)
Glucose: 81 mg/dL (ref 70–99)
Potassium: 4.5 mmol/L (ref 3.5–5.2)
Sodium: 140 mmol/L (ref 134–144)
Total Protein: 6.3 g/dL (ref 6.0–8.5)
eGFR: 88 mL/min/{1.73_m2} (ref >59)

## 2024-05-14 LAB — LIPID PANEL
Chol/HDL Ratio: 2.4 ratio (ref 0.0–4.4)
Cholesterol, Total: 162 mg/dL (ref 100–199)
HDL: 68 mg/dL (ref >39)
LDL Chol Calc (NIH): 77 mg/dL (ref 0–99)
Triglycerides: 91 mg/dL (ref 0–149)
VLDL Cholesterol Cal: 17 mg/dL (ref 5–40)

## 2024-05-14 NOTE — Anesthesia Postprocedure Evaluation (Signed)
 Anesthesia Post Note  Patient: Tracy Chase  Procedure(s) Performed: CHOLECYSTECTOMY, ROBOT-ASSISTED, LAPAROSCOPIC  Patient location during evaluation: PACU Anesthesia Type: General Level of consciousness: awake and awake and alert Pain management: satisfactory to patient Vital Signs Assessment: post-procedure vital signs reviewed and stable Respiratory status: spontaneous breathing Cardiovascular status: stable Anesthetic complications: no   No notable events documented.   Last Vitals:  Vitals:   05/09/24 0257 05/09/24 0836  BP: (!) 141/72 (!) 146/69  Pulse: (!) 53 (!) 52  Resp: 17 18  Temp: 36.6 C 36.8 C  SpO2: 98% 97%    Last Pain:  Vitals:   05/09/24 0900  TempSrc:   PainSc: 5                  VAN STAVEREN,Avion Patella

## 2024-05-17 ENCOUNTER — Other Ambulatory Visit: Payer: Self-pay | Admitting: Internal Medicine

## 2024-05-19 NOTE — Telephone Encounter (Signed)
 Requested Prescriptions  Pending Prescriptions Disp Refills   FLUoxetine  (PROZAC ) 20 MG capsule [Pharmacy Med Name: FLUoxetine  HCl 20 MG Oral Capsule] 100 capsule 1    Sig: TAKE 1 CAPSULE BY MOUTH DAILY     Psychiatry:  Antidepressants - SSRI Passed - 05/19/2024  3:45 PM      Passed - Valid encounter within last 6 months    Recent Outpatient Visits           6 days ago Essential (primary) hypertension   [place] [facility] at [facility], Leita DEL, MD

## 2024-05-20 ENCOUNTER — Encounter: Payer: Self-pay | Admitting: Surgery

## 2024-05-20 ENCOUNTER — Ambulatory Visit (INDEPENDENT_AMBULATORY_CARE_PROVIDER_SITE_OTHER): Admitting: Surgery

## 2024-05-20 VITALS — BP 117/82 | HR 71 | Temp 98.9°F | Ht 66.0 in | Wt 164.0 lb

## 2024-05-20 DIAGNOSIS — Z09 Encounter for follow-up examination after completed treatment for conditions other than malignant neoplasm: Secondary | ICD-10-CM

## 2024-05-20 DIAGNOSIS — K810 Acute cholecystitis: Secondary | ICD-10-CM

## 2024-05-20 NOTE — Progress Notes (Signed)
 05/20/2024  HPI: Tracy Chase is a 68 y.o. female s/p robotic assisted cholecystectomy on 05/08/2024 for acute cholecystitis.  Patient presents today for follow-up.  She reports that she has been feeling well except for having some diarrhea and some fatigue.  Otherwise denies any worsening pain at the incisions.  She is tolerating a diet.  Vital signs: BP 117/82   Pulse 71   Temp 98.9 F (37.2 C) (Oral)   Ht 5' 6 (1.676 m)   Wt 164 lb (74.4 kg)   SpO2 98%   BMI 26.47 kg/m    Physical Exam: Constitutional: No acute distress Abdomen: Soft, nondistended, with minimal soreness to palpation.  Incisions are healing well and are clean, dry, intact with some mild ecchymosis.  No evidence of infection.  Assessment/Plan: This is a 68 y.o. female s/p robotic assisted cholecystectomy.  - Discussed with the patient that the diarrhea may be related to her body not adjusting well yet to not having her gallbladder.  This could also be as a result of the antibiotics that she had postoperatively to treat the cholecystitis.  Recommend the patient that she can take probiotics to help restore some of the normal intestinal flora as well as fiber supplementation to help reabsorb more of the fluid within the intestines.  As a last resort, she can try Imodium as needed.  However all this will keep improving as her body adjusts to not having a gallbladder and her intestinal flora goes back to normal with antibiotics being done. - Follow-up as needed.   Aloysius [name], MD [facility]

## 2024-05-20 NOTE — Patient Instructions (Addendum)
 Advised to pursue a goal of 25 to 30 g of fiber daily.  The majority of this may be through natural sources, advised to ensure a minimal daily fiber supplementation.  Various forms of supplements discussed.  Recommend Psyllium husk, with options of mixing with beverage or applesauce to make more tolerable. Strongly advised to consume more fluids(especially in proximity to fiber intake) and to ensure adequate hydration.   Watch color of urine to determine adequacy of hydration.  Clarity is pursued in urine output, and bowel activity that responds and corresponds to significant meal intake.   We need to avoid deferring having bowel movements, advised to take the time at the first sign of sensation, typically following meals, and in the morning.  The need to avoid more frequently, and the presence of flatus may indicate the need for bowel movement.  Do not defer for later.  Addition of MiraLAX (or its generic equivalent) may be needed ensure at least twice daily bowel movements.  If multiple doses of MiraLAX are necessary utilize them. Never skip a day... Do not tolerate a day without a bowel movement unless you are fasting.  To be regular, we must do the above EVERY day.   Soluble Fiber Dissolves in Water: Soluble fiber dissolves in water to form a gel-like substance. Slows Digestion: This type of fiber slows down digestion, which can help control blood sugar levels and lower cholesterol. Sources: Common sources include oats, beans, apples, citrus fruits, and psyllium. Benefits: Helps manage cholesterol levels. Aids in blood sugar control. Increases healthy gut bacteria, which can lower inflammation and improve digestion.  Insoluble Fiber Does Not Dissolve in Water: Insoluble fiber does not dissolve in water and remains mostly intact as it passes through the digestive system. Adds Bulk to Stool: It adds bulk to stool, which helps promote regular bowel movements and prevent constipation. Sources:  Common sources include whole grains, nuts, beans, and vegetables like cauliflower and potatoes. Benefits: Improves bowel health and regularity. Reduces the risk of colorectal conditions like hemorrhoids and diverticulitis. Supports insulin sensitivity in people with diabetes.  Both types of fiber are essential for overall health, and it's beneficial to include a 50/50 mix of both in your diet.     GENERAL POST-OPERATIVE PATIENT INSTRUCTIONS   WOUND CARE INSTRUCTIONS:  Keep a dry clean dressing on the wound if there is drainage. The initial bandage may be removed after 24 hours.  Once the wound has quit draining you may leave it open to air.  If clothing rubs against the wound or causes irritation and the wound is not draining you may cover it with a dry dressing during the daytime.  Try to keep the wound dry and avoid ointments on the wound unless directed to do so.  If the wound becomes bright red and painful or starts to drain infected material that is not clear, please contact your physician immediately.  If the wound is mildly pink and has a thick firm ridge underneath it, this is normal, and is referred to as a healing ridge.  This will resolve over the next 4-6 weeks.  BATHING: You may shower if you have been informed of this by your surgeon. However, Please do not submerge in a tub, hot tub, or pool until incisions are completely sealed or have been told by your surgeon that you may do so.  DIET:  You may eat any foods that you can tolerate.  It is a good idea to eat a high fiber  diet and take in plenty of fluids to prevent constipation.  If you do become constipated you may want to take a mild laxative or take ducolax tablets on a daily basis until your bowel habits are regular.  Constipation can be very uncomfortable, along with straining, after recent surgery.  ACTIVITY:  You are encouraged to cough and deep breath or use your incentive spirometer if you were given one, every 15-30  minutes when awake.  This will help prevent respiratory complications and low grade fevers post-operatively if you had a general anesthetic.  You may want to hug a pillow when coughing and sneezing to add additional support to the surgical area, if you had abdominal or chest surgery, which will decrease pain during these times.  You are encouraged to walk and engage in light activity for the next two weeks.  You should not lift more than 20 pounds for 6 weeks total after surgery as it could put you at increased risk for complications.  Twenty pounds is roughly equivalent to a plastic bag of groceries. At that time- Listen to your body when lifting, if you have pain when lifting, stop and then try again in a few days. Soreness after doing exercises or activities of daily living is normal as you get back in to your normal routine.  MEDICATIONS:  Try to take narcotic medications and anti-inflammatory medications, such as tylenol, ibuprofen, naprosyn, etc., with food.  This will minimize stomach upset from the medication.  Should you develop nausea and vomiting from the pain medication, or develop a rash, please discontinue the medication and contact your physician.  You should not drive, make important decisions, or operate machinery when taking narcotic pain medication.  SUNBLOCK Use sun block to incision area over the next year if this area will be exposed to sun. This helps decrease scarring and will allow you avoid a permanent darkened area over your incision.  QUESTIONS:  Please feel free to call our office if you have any questions, and we will be glad to assist you. [phone]

## 2024-05-21 ENCOUNTER — Other Ambulatory Visit: Payer: Self-pay | Admitting: Internal Medicine

## 2024-05-21 DIAGNOSIS — E782 Mixed hyperlipidemia: Secondary | ICD-10-CM

## 2024-05-22 NOTE — Telephone Encounter (Signed)
 Requested Prescriptions  Refused Prescriptions Disp Refills   rosuvastatin  (CRESTOR ) 5 MG tablet [Pharmacy Med Name: Rosuvastatin  Calcium  5 MG Oral Tablet] 90 tablet 3    Sig: TAKE 1 TABLET BY MOUTH DAILY     Cardiovascular:  Antilipid - Statins 2 Failed - 05/22/2024  2:54 PM      Failed - Lipid Panel in normal range within the last 12 months    Cholesterol, Total  Date Value Ref Range Status  05/13/2024 162 100 - 199 mg/dL Final   LDL Chol Calc (NIH)  Date Value Ref Range Status  05/13/2024 77 0 - 99 mg/dL Final   HDL  Date Value Ref Range Status  05/13/2024 68 >39 mg/dL Final   Triglycerides  Date Value Ref Range Status  05/13/2024 91 0 - 149 mg/dL Final         Passed - Cr in normal range and within 360 days    Creatinine  Date Value Ref Range Status  05/11/2014 1.02 0.60 - 1.30 mg/dL Final   Creatinine, Ser  Date Value Ref Range Status  05/13/2024 0.73 0.57 - 1.00 mg/dL Final         Passed - Patient is not pregnant      Passed - Valid encounter within last 12 months    Recent Outpatient Visits           1 week ago Essential (primary) hypertension   [place] [facility] at [facility], Leita DEL, MD

## 2024-06-04 DIAGNOSIS — Z85828 Personal history of other malignant neoplasm of skin: Secondary | ICD-10-CM | POA: Diagnosis not present

## 2024-06-04 DIAGNOSIS — L708 Other acne: Secondary | ICD-10-CM | POA: Diagnosis not present

## 2024-06-04 DIAGNOSIS — Z08 Encounter for follow-up examination after completed treatment for malignant neoplasm: Secondary | ICD-10-CM | POA: Diagnosis not present

## 2024-06-04 DIAGNOSIS — D485 Neoplasm of uncertain behavior of skin: Secondary | ICD-10-CM | POA: Diagnosis not present

## 2024-06-04 DIAGNOSIS — L570 Actinic keratosis: Secondary | ICD-10-CM | POA: Diagnosis not present

## 2024-06-04 DIAGNOSIS — D0471 Carcinoma in situ of skin of right lower limb, including hip: Secondary | ICD-10-CM | POA: Diagnosis not present

## 2024-06-09 DIAGNOSIS — D0471 Carcinoma in situ of skin of right lower limb, including hip: Secondary | ICD-10-CM | POA: Diagnosis not present

## 2024-06-26 ENCOUNTER — Other Ambulatory Visit: Payer: Self-pay | Admitting: Internal Medicine

## 2024-06-26 DIAGNOSIS — F411 Generalized anxiety disorder: Secondary | ICD-10-CM

## 2024-06-30 NOTE — Telephone Encounter (Signed)
 Please review.  KP

## 2024-06-30 NOTE — Telephone Encounter (Signed)
 Requested medication (s) are due for refill today: yes  Requested medication (s) are on the active medication list: yes  Last refill:  04/17/24  Future visit scheduled: yes  Notes to clinic:  Unable to refill per protocol, cannot delegate.      Requested Prescriptions  Pending Prescriptions Disp Refills   ALPRAZolam  (XANAX ) 0.25 MG tablet [Pharmacy Med Name: ALPRAZOLAM  0.25 MG TABLET] 20 tablet 0    Sig: TAKE 1 TABLET BY MOUTH TWICE A DAY AS NEEDED FOR ANXIETY     Not Delegated - Psychiatry: Anxiolytics/Hypnotics 2 Failed - 06/30/2024  9:28 AM      Failed - This refill cannot be delegated      Failed - Urine Drug Screen completed in last 360 days      Passed - Patient is not pregnant      Passed - Valid encounter within last 6 months    Recent Outpatient Visits           1 month ago Essential (primary) hypertension   [place] [facility] at [facility], Leita DEL, MD

## 2024-07-06 ENCOUNTER — Encounter: Payer: Self-pay | Admitting: Internal Medicine

## 2024-07-06 ENCOUNTER — Other Ambulatory Visit: Payer: Self-pay | Admitting: Internal Medicine

## 2024-07-06 DIAGNOSIS — F5101 Primary insomnia: Secondary | ICD-10-CM

## 2024-07-06 DIAGNOSIS — E782 Mixed hyperlipidemia: Secondary | ICD-10-CM

## 2024-07-07 NOTE — Telephone Encounter (Signed)
 Please review.  KP

## 2024-07-07 NOTE — Telephone Encounter (Signed)
 Requested medications are due for refill today.  yes  Requested medications are on the active medications list.  yes  Last refill. 01/10/2024 #90 1 rf  Future visit scheduled.   yes  Notes to clinic.  Refill not delegated.    Requested Prescriptions  Pending Prescriptions Disp Refills   zolpidem  (AMBIEN ) 10 MG tablet [Pharmacy Med Name: Zolpidem  Tartrate 10 MG Oral Tablet] 90 tablet     Sig: TAKE 1 TABLET BY MOUTH AT  BEDTIME     Not Delegated - Psychiatry:  Anxiolytics/Hypnotics Failed - 07/07/2024  4:17 PM      Failed - This refill cannot be delegated      Failed - Urine Drug Screen completed in last 360 days      Passed - Valid encounter within last 6 months    Recent Outpatient Visits           1 month ago Essential (primary) hypertension   [place] [facility] at [facility], Leita DEL, MD              Signed Prescriptions Disp Refills   rosuvastatin  (CRESTOR ) 5 MG tablet 100 tablet 3    Sig: TAKE 1 TABLET BY MOUTH DAILY     Cardiovascular:  Antilipid - Statins 2 Failed - 07/07/2024  4:17 PM      Failed - Lipid Panel in normal range within the last 12 months    Cholesterol, Total  Date Value Ref Range Status  05/13/2024 162 100 - 199 mg/dL Final   LDL Chol Calc (NIH)  Date Value Ref Range Status  05/13/2024 77 0 - 99 mg/dL Final   HDL  Date Value Ref Range Status  05/13/2024 68 >39 mg/dL Final   Triglycerides  Date Value Ref Range Status  05/13/2024 91 0 - 149 mg/dL Final         Passed - Cr in normal range and within 360 days    Creatinine  Date Value Ref Range Status  05/11/2014 1.02 0.60 - 1.30 mg/dL Final   Creatinine, Ser  Date Value Ref Range Status  05/13/2024 0.73 0.57 - 1.00 mg/dL Final         Passed - Patient is not pregnant      Passed - Valid encounter within last 12 months    Recent Outpatient Visits           1 month ago Essential (primary) hypertension   [place] [facility] at [facility], Leita DEL, MD

## 2024-07-07 NOTE — Telephone Encounter (Signed)
 Requested Prescriptions  Pending Prescriptions Disp Refills   rosuvastatin  (CRESTOR ) 5 MG tablet [Pharmacy Med Name: Rosuvastatin  Calcium  5 MG Oral Tablet] 90 tablet 3    Sig: TAKE 1 TABLET BY MOUTH DAILY     Cardiovascular:  Antilipid - Statins 2 Failed - 07/07/2024  4:17 PM      Failed - Lipid Panel in normal range within the last 12 months    Cholesterol, Total  Date Value Ref Range Status  05/13/2024 162 100 - 199 mg/dL Final   LDL Chol Calc (NIH)  Date Value Ref Range Status  05/13/2024 77 0 - 99 mg/dL Final   HDL  Date Value Ref Range Status  05/13/2024 68 >39 mg/dL Final   Triglycerides  Date Value Ref Range Status  05/13/2024 91 0 - 149 mg/dL Final         Passed - Cr in normal range and within 360 days    Creatinine  Date Value Ref Range Status  05/11/2014 1.02 0.60 - 1.30 mg/dL Final   Creatinine, Ser  Date Value Ref Range Status  05/13/2024 0.73 0.57 - 1.00 mg/dL Final         Passed - Patient is not pregnant      Passed - Valid encounter within last 12 months    Recent Outpatient Visits           1 month ago Essential (primary) hypertension   [facility] at [facility], Leita DEL, MD               zolpidem  (AMBIEN ) 10 MG tablet [Pharmacy Med Name: Zolpidem  Tartrate 10 MG Oral Tablet] 90 tablet     Sig: TAKE 1 TABLET BY MOUTH AT  BEDTIME     Not Delegated - Psychiatry:  Anxiolytics/Hypnotics Failed - 07/07/2024  4:17 PM      Failed - This refill cannot be delegated      Failed - Urine Drug Screen completed in last 360 days      Passed - Valid encounter within last 6 months    Recent Outpatient Visits           1 month ago Essential (primary) hypertension   [facility] at [facility], Leita DEL, MD

## 2024-07-22 DIAGNOSIS — D0471 Carcinoma in situ of skin of right lower limb, including hip: Secondary | ICD-10-CM | POA: Diagnosis not present

## 2024-08-05 ENCOUNTER — Ambulatory Visit (INDEPENDENT_AMBULATORY_CARE_PROVIDER_SITE_OTHER): Admitting: Emergency Medicine

## 2024-08-05 VITALS — Ht 65.5 in | Wt 170.0 lb

## 2024-08-05 DIAGNOSIS — Z1231 Encounter for screening mammogram for malignant neoplasm of breast: Secondary | ICD-10-CM

## 2024-08-05 DIAGNOSIS — D485 Neoplasm of uncertain behavior of skin: Secondary | ICD-10-CM | POA: Diagnosis not present

## 2024-08-05 DIAGNOSIS — Z0000 Encounter for general adult medical examination without abnormal findings: Secondary | ICD-10-CM | POA: Diagnosis not present

## 2024-08-05 DIAGNOSIS — C44622 Squamous cell carcinoma of skin of right upper limb, including shoulder: Secondary | ICD-10-CM | POA: Diagnosis not present

## 2024-08-05 DIAGNOSIS — Z780 Asymptomatic menopausal state: Secondary | ICD-10-CM

## 2024-08-05 NOTE — Progress Notes (Signed)
 Subjective:   Tracy Chase is a 68 y.o. who presents for a Medicare Wellness preventive visit.  As a reminder, Annual Wellness Visits don't include a physical exam, and some assessments may be limited, especially if this visit is performed virtually. We may recommend an in-person follow-up visit with your provider if needed.  Visit Complete: Virtual I connected with  Tracy Chase on 08/05/24 by a video and audio enabled telemedicine application and verified that I am speaking with the correct person using two identifiers.  Patient Location: Home  Provider Location: Home Office  I discussed the limitations of evaluation and management by telemedicine. The patient expressed understanding and agreed to proceed.  Vital Signs: Because this visit was a virtual/telehealth visit, some criteria may be missing or patient reported. Any vitals not documented were not able to be obtained and vitals that have been documented are patient reported.    Persons Participating in Visit: Patient.  AWV Questionnaire: Yes: Patient Medicare AWV questionnaire was completed by the patient on 07/29/24; I have confirmed that all information answered by patient is correct and no changes since this date.  Cardiac Risk Factors include: advanced age (>24men, >68 women);dyslipidemia;hypertension     Objective:    Today's Vitals   08/05/24 1437  Weight: 170 lb (77.1 kg)  Height: 5' 5.5 (1.664 m)   Body mass index is 27.86 kg/m.     08/05/2024    2:54 PM 05/08/2024   12:06 PM 05/07/2024    2:05 PM 05/07/2024    3:56 AM 08/21/2023    5:49 PM 08/21/2023    7:09 AM 07/30/2023   10:14 AM  Advanced Directives  Does Patient Have a Medical Advance Directive? No No  No  No No  Would patient like information on creating a medical advance directive? No - Patient declined No - Patient declined No - Patient declined  No - Patient declined  No - Patient declined    Current Medications (verified) Outpatient Encounter  Medications as of 08/05/2024  Medication Sig   ALPRAZolam  (XANAX ) 0.25 MG tablet TAKE 1 TABLET BY MOUTH TWICE A DAY AS NEEDED FOR ANXIETY   ammonium lactate (LAC-HYDRIN) 12 % lotion SMARTSIG:sparingly Topical Twice Daily   FLUoxetine  (PROZAC ) 20 MG capsule TAKE 1 CAPSULE BY MOUTH DAILY   pantoprazole  (PROTONIX ) 40 MG tablet Take 1 tablet (40 mg total) by mouth daily.   Plecanatide  (TRULANCE ) 3 MG TABS Take 1 tablet (3 mg total) by mouth daily.   rosuvastatin  (CRESTOR ) 5 MG tablet TAKE 1 TABLET BY MOUTH DAILY   triamterene -hydrochlorothiazide  (MAXZIDE -25) 37.5-25 MG tablet Take 1 tablet by mouth daily.   zolpidem  (AMBIEN ) 10 MG tablet TAKE 1 TABLET BY MOUTH AT  BEDTIME   acetaminophen  (TYLENOL ) 500 MG tablet Take 2 tablets (1,000 mg total) by mouth every 6 (six) hours as needed for mild pain (pain score 1-3). (Patient not taking: Reported on 08/05/2024)   ibuprofen  (ADVIL ) 600 MG tablet Take 1 tablet (600 mg total) by mouth every 8 (eight) hours as needed for moderate pain (pain score 4-6). (Patient not taking: Reported on 08/05/2024)   oxyCODONE  (OXY IR/ROXICODONE ) 5 MG immediate release tablet Take 1 tablet (5 mg total) by mouth every 4 (four) hours as needed for severe pain (pain score 7-10). (Patient not taking: Reported on 08/05/2024)   No facility-administered encounter medications on file as of 08/05/2024.    Allergies (verified) Hydrocodone    History: Past Medical History:  Diagnosis Date   Anemia  h/o   Anxiety 1995   Aortic atherosclerosis    Arthritis    Cancer (HCC)    RENAL CELL   Depression    Diverticulitis of small intestine with perforation and abscess 05/31/2023   GERD (gastroesophageal reflux disease)    OCC NO MEDS   Hypertension    Incisional hernia    Insomnia    Rib lesion    3 x 2.1 x 2 cm expansile right fourth rib posteriorly   Sepsis [facility])    Past Surgical History:  Procedure Laterality Date   ABDOMINAL HYSTERECTOMY     APPENDECTOMY  2011   Lap  converted to exlap appendectomy, drainage of abd abscesses   CESAREAN SECTION     CHOLECYSTECTOMY, ROBOT-ASSISTED, LAPAROSCOPIC  05/08/2024   COLONOSCOPY WITH PROPOFOL  N/A 08/28/2019   Procedure: COLONOSCOPY WITH PROPOFOL ;  Surgeon: Janalyn Keene NOVAK, MD;  Location: [facility] ENDOSCOPY;  Service: Endoscopy;  Laterality: N/A;   COLONOSCOPY WITH PROPOFOL  N/A 08/21/2023   Procedure: COLONOSCOPY WITH PROPOFOL ;  Surgeon: Therisa Bi, MD;  Location: [facility] ENDOSCOPY;  Service: Gastroenterology;  Laterality: N/A;   cryoablation for renal cell carcinoma  2012   ESOPHAGOGASTRODUODENOSCOPY (EGD) WITH PROPOFOL  N/A 08/21/2023   Procedure: ESOPHAGOGASTRODUODENOSCOPY (EGD) WITH PROPOFOL ;  Surgeon: Therisa Bi, MD;  Location: [facility] ENDOSCOPY;  Service: Gastroenterology;  Laterality: N/A;   ETHMOIDECTOMY Left 09/06/2020   Procedure: TOTAL ETHMOIDECTOMY;  Surgeon: Blair Mt, MD;  Location: [facility] SURGERY CNTR;  Service: ENT;  Laterality: Left;   FRONTAL SINUS EXPLORATION Left 09/06/2020   Procedure: FRONTAL SINUS EXPLORATION;  Surgeon: Blair Mt, MD;  Location: [facility] SURGERY CNTR;  Service: ENT;  Laterality: Left;   HERNIA REPAIR     IMAGE GUIDED SINUS SURGERY N/A 09/06/2020   Procedure: IMAGE GUIDED SINUS SURGERY;  Surgeon: Blair Mt, MD;  Location: [facility] SURGERY CN[facility];  Service: ENT;  Laterality: N/A;  need stryker disk PLACE DISK ON OR CHARGE NURSE DESK 10-01 KP   INSERTION OF MESH  01/10/2023   Procedure: INSERTION OF MESH;  Surgeon: Desiderio Schanz, MD;  Location: [facility] ORS;  Service: General;;   JOINT REPLACEMENT Left    partial knee replacement   LUMBAR FUSION  1994   MAXILLARY ANTROSTOMY Left 09/06/2020   Procedure: MAXILLARY ANTROSTOMY WITH TISSUE;  Surgeon: Blair Mt, MD;  Location: [facility] SURGERY CNTR;  Service: ENT;  Laterality: Left;   MEDIAL PARTIAL KNEE REPLACEMENT Left    SPHENOIDECTOMY Left 09/06/2020   Procedure: SPHENOIDECTOMY;  Surgeon: Blair Mt, MD;  Location: [facility]  SURGERY CNTR;  Service: ENT;  Laterality: Left;   SPINE SURGERY     TONSILLECTOMY     TOTAL KNEE REVISION Left 02/21/2021   Procedure: Revision, left partial to total knee;  Surgeon: Kathlynn Sharper, MD;  Location: [facility] ORS;  Service: Orthopedics;  Laterality: Left;  CHRIS GAINES TO ASSIST   TUBAL LIGATION     VENTRAL HERNIA REPAIR  2012   open ventral hernia repair with mesh   XI ROBOTIC ASSISTED VENTRAL HERNIA N/A 01/10/2023   Procedure: XI ROBOTIC ASSISTED VENTRAL HERNIA WITH LYSIS OF ADHESIONS;  Surgeon: Desiderio Schanz, MD;  Location: [facility] ORS;  Service: General;  Laterality: N/A;   Family History  Problem Relation Age of Onset   Kidney failure Mother    Anxiety disorder Mother    Asthma Mother    Cancer Mother    COPD Mother    Diabetes Mother    Heart disease Mother    Kidney disease Mother    Stroke Mother  Varicose Veins Mother    Congestive Heart Failure Father    Diabetes Father    Heart disease Father    Varicose Veins Father    Arthritis Brother    Breast cancer Neg Hx    Social History   Socioeconomic History   Marital status: Significant Other    Spouse name: Not on file   Number of children: 1   Years of education: Not on file   Highest education level: Some college, no degree  Occupational History   Occupation: Retired  Tobacco Use   Smoking status: Former    Current packs/day: 0.00    Types: Cigarettes    Quit date: 1990    Years since quitting: 35.7    Passive exposure: Past   Smokeless tobacco: Never   Tobacco comments:    was social Smoker when in [employer] and early 20s  Vaping Use   Vaping status: Never Used  Substance and Sexual Activity   Alcohol use: Yes    Comment: 1-2 drinks 2-3 times weekly   Drug use: No   Sexual activity: Not on file  Other Topics Concern   Not on file  Social History Narrative   Pt has 1 son and 2 step daughters. Lives with significant other for >30 yrs   Social Drivers of Corporate investment banker  Strain: Low Risk  (08/05/2024)   Overall Financial Resource Strain (CARDIA)    Difficulty of Paying Living Expenses: Not very hard  Food Insecurity: No Food Insecurity (08/05/2024)   Hunger Vital Sign    Worried About Running Out of Food in the Last Year: Never true    Ran Out of Food in the Last Year: Never true  Transportation Needs: No Transportation Needs (08/05/2024)   PRAPARE - Administrator, Civil Service (Medical): No    Lack of Transportation (Non-Medical): No  Physical Activity: Sufficiently Active (08/05/2024)   Exercise Vital Sign    Days of Exercise per Week: 5 days    Minutes of Exercise per Session: 40 min  Stress: Stress Concern Present (08/05/2024)   [employer] of Occupational Health - Occupational Stress Questionnaire    Feeling of Stress: Rather much  Social Connections: Moderately Isolated (08/05/2024)   Social Connection and Isolation Panel    Frequency of Communication with Friends and Family: More than three times a week    Frequency of Social Gatherings with Friends and Family: More than three times a week    Attends Religious Services: Never    Database administrator or Organizations: No    Attends Engineer, structural: Never    Marital Status: Living with partner    Tobacco Counseling Counseling given: Not Answered Tobacco comments: was social Smoker when in [employer] and early 20s    Clinical Intake:  Pre-visit preparation completed: Yes  Pain : No/denies pain     BMI - recorded: 27.86 Nutritional Status: BMI 25 -29 Overweight Nutritional Risks: Nausea/ vomitting/ diarrhea (nausea only) Diabetes: No  Lab Results  Component Value Date   HGBA1C 5.3 11/01/2021     How often do you need to have someone help you when you read instructions, pamphlets, or other written materials from your doctor or pharmacy?: 1 - Never  Interpreter Needed?: No  Information entered by :: Vina Ned, CMA   Activities of Daily  Living     08/05/2024    2:38 PM 07/29/2024   10:48 AM  In your present  state of health, do you have any difficulty performing the following activities:  Hearing? 0 0  Vision? 0 0  Difficulty concentrating or making decisions? 0 0  Walking or climbing stairs? 0 0  Dressing or bathing? 0 0  Doing errands, shopping? 0 0  Preparing Food and eating ? N N  Using the Toilet? N N  In the past six months, have you accidently leaked urine? N N  Do you have problems with loss of bowel control? N N  Managing your Medications? N N  Managing your Finances? N N  Housekeeping or managing your Housekeeping? N N    Patient Care Team: Justus Leita DEL, MD as PCP - General (Internal Medicine) Loistine Sober, NP as Nurse Practitioner (Cardiology) [facility] (Dermatology) Desiderio Schanz, MD as Consulting Physician (General Surgery) Selinda Drought (Optometry)  I have updated your Care Teams any recent Medical Services you may have received from other providers in the past year.     Assessment:   This is a routine wellness examination for [place].  Hearing/Vision screen Hearing Screening - Comments:: Denies hearing loss  Vision Screening - Comments:: Gets routine eye exams, [facility], [place]   Goals Addressed             This Visit's Progress    Patient Stated       Lose 30lbs     COMPLETED: Weight (lb) < 200 lb (90.7 kg)   170 lb (77.1 kg)    Pt would like to lose weight over the next year with healthy eating and physical activity       Depression Screen     08/05/2024    2:47 PM 05/13/2024    8:23 AM 08/27/2023    1:51 PM 07/30/2023   10:12 AM 07/04/2023   11:32 AM 05/20/2023    1:24 PM 12/25/2022    8:40 AM  PHQ 2/9 Scores  PHQ - 2 Score 2 2 2  0 0 0 0  PHQ- 9 Score 4 9 6  0 0 0 1    Fall Risk     08/05/2024    2:56 PM 07/29/2024   10:48 AM 05/13/2024    8:22 AM 08/27/2023    1:51 PM 07/30/2023   10:14 AM  Fall Risk   Falls in the past year? 0 0  0 0   Number falls in past yr: 0 0 0 0 0  Injury with Fall? 0 0 0 0 0  Risk for fall due to : No Fall Risks  No Fall Risks No Fall Risks No Fall Risks  Follow up Falls evaluation completed  Falls evaluation completed Falls evaluation completed Falls prevention discussed;Falls evaluation completed    MEDICARE RISK AT HOME:  Medicare Risk at Home Any stairs in or around the home?: Yes If so, are there any without handrails?: No Home free of loose throw rugs in walkways, pet beds, electrical cords, etc?: Yes Adequate lighting in your home to reduce risk of falls?: Yes Life alert?: No Use of a cane, walker or w/c?: No Grab bars in the bathroom?: Yes Shower chair or bench in shower?: No Elevated toilet seat or a handicapped toilet?: No  TIMED UP AND GO:  Was the test performed?  No  Cognitive Function: 6CIT completed        08/05/2024    2:57 PM 07/30/2023   10:15 AM 07/13/2022    8:55 AM 04/18/2020    9:41 AM  6CIT Screen  What Year?  0 points 0 points 0 points 0 points  What month? 0 points 0 points 0 points 0 points  What time? 0 points 0 points 0 points 0 points  Count back from 20 0 points 0 points 0 points 0 points  Months in reverse 0 points 0 points 0 points 0 points  Repeat phrase 0 points 0 points 0 points 0 points  Total Score 0 points 0 points 0 points 0 points    Immunizations Immunization History  Administered Date(s) Administered   Fluad Quad(high Dose 65+) 09/02/2019, 07/27/2022   Influenza Inj Mdck Quad Pf 08/18/2018   Influenza,inj,Quad PF,6+ Mos 10/02/2016, 09/03/2017   Influenza-Unspecified 08/26/2020, 08/12/2021, 07/20/2023, 07/23/2024   PFIZER(Purple Top)SARS-COV-2 Vaccination 02/08/2020, 03/01/2020, 09/30/2020   Pfizer Covid-19 Vaccine Bivalent Booster 38yrs & up 05/16/2021   Pneumococcal Conjugate-13 09/23/2019   Pneumococcal Polysaccharide-23 10/12/2020   Tdap 04/29/2012   Zoster Recombinant(Shingrix ) 08/13/2021, 11/20/2021    Screening Tests Health  Maintenance  Topic Date Due   DTaP/Tdap/Td (2 - Td or Tdap) 04/29/2022   COVID-19 Vaccine (5 - 2025-26 season) 07/13/2024   DEXA SCAN  10/06/2024   Mammogram  01/05/2025   Medicare Annual Wellness (AWV)  08/05/2025   Colonoscopy  08/20/2028   Pneumococcal Vaccine: 50+ Years  Completed   Influenza Vaccine  Completed   Hepatitis C Screening  Completed   Zoster Vaccines- Shingrix   Completed   HPV VACCINES  Aged Out   Meningococcal B Vaccine  Aged Out    Health Maintenance Items Addressed: Mammogram ordered, DEXA ordered, See Nurse Notes at the end of this note  Additional Screening:  Vision Screening: Recommended annual ophthalmology exams for early detection of glaucoma and other disorders of the eye. Is the patient up to date with their annual eye exam?  Yes  Who is the provider or what is the name of the office in which the patient attends annual eye exams? Dr. Catheline, [place]  Dental Screening: Recommended annual dental exams for proper oral hygiene  Community Resource Referral / Chronic Care Management: CRR required this visit?  No   CCM required this visit?  No   Plan:    I have personally reviewed and noted the following in the patient's chart:   Medical and social history Use of alcohol, tobacco or illicit drugs  Current medications and supplements including opioid prescriptions. Patient is not currently taking opioid prescriptions. Functional ability and status Nutritional status Physical activity Advanced directives List of other physicians Hospitalizations, surgeries, and ER visits in previous 12 months Vitals Screenings to include cognitive, depression, and falls Referrals and appointments  In addition, I have reviewed and discussed with patient certain preventive protocols, quality metrics, and best practice recommendations. A written personalized care plan for preventive services as well as general preventive health recommendations were provided  to patient.   Vina Ned, CMA   08/05/2024   After Visit Summary: (MyChart) Due to this being a telephonic visit, the after visit summary with patients personalized plan was offered to patient via MyChart   Notes:  Needs Tdap vaccine (pharmacy) Placed order for [facility] (due ~ 01/06/25) and DEXA scan (due~ 10/06/24), but will have same time as [facility] Declined Covid vaccine

## 2024-08-05 NOTE — Patient Instructions (Signed)
 Tracy Chase,  Thank you for taking the time for your Medicare Wellness Visit. I appreciate your continued commitment to your health goals. Please review the care plan we discussed, and feel free to reach out if I can assist you further.  Medicare recommends these wellness visits once per year to help you and your care team stay ahead of potential health issues. These visits are designed to focus on prevention, allowing your provider to concentrate on managing your acute and chronic conditions during your regular appointments.  Please note that Annual Wellness Visits do not include a physical exam. Some assessments may be limited, especially if the visit was conducted virtually. If needed, we may recommend a separate in-person follow-up with your provider.  Ongoing Care Seeing your primary care provider every 3 to 6 months helps us  monitor your health and provide consistent, personalized care.   Referrals If a referral was made during today's visit and you haven't received any updates within two weeks, please contact the referred provider directly to check on the status.  Recommended Screenings:  Get the Tetanus vaccine at your local pharmacy at your convenience.  Please call to schedule your mammogram (due 01/06/25) and bone density scan (due 10/06/24), but you may schedule both tests in Feb 2026:  [facility] Address: [address], [place], [place] Phone: [phone]  [facility] at [facility] [address], [address] [place], [place] [address] Phone: [phone]   Health Maintenance  Topic Date Due   DTaP/Tdap/Td vaccine (2 - Td or Tdap) 04/29/2022   COVID-19 Vaccine (5 - 2025-26 season) 07/13/2024   DEXA scan (bone density measurement)  10/06/2024   Breast Cancer Screening  01/05/2025   Medicare Annual Wellness Visit  08/05/2025   Colon Cancer Screening  08/20/2028   Pneumococcal Vaccine for age over 34  Completed   Flu  Shot  Completed   Hepatitis C Screening  Completed   Zoster (Shingles) Vaccine  Completed   HPV Vaccine  Aged Out   Meningitis B Vaccine  Aged Out       08/05/2024    2:54 PM  Advanced Directives  Does Patient Have a Medical Advance Directive? No  Would patient like information on creating a medical advance directive? No - Patient declined   Advance Care Planning is important because it: Ensures you receive medical care that aligns with your values, goals, and preferences. Provides guidance to your family and loved ones, reducing the emotional burden of decision-making during critical moments.  Vision: Annual vision screenings are recommended for early detection of glaucoma, cataracts, and diabetic retinopathy. These exams can also reveal signs of chronic conditions such as diabetes and high blood pressure.  Dental: Annual dental screenings help detect early signs of oral cancer, gum disease, and other conditions linked to overall health, including heart disease and diabetes.  Please see the attached documents for additional preventive care recommendations.   Fall Prevention in the Home, Adult Falls can cause injuries and affect people of all ages. There are many simple things that you can do to make your home safe and to help prevent falls. If you need it, ask for help making these changes. What actions can I take to prevent falls? General information Use good lighting in all rooms. Make sure to: Replace any light bulbs that burn out. Turn on lights if it is dark and use night-lights. Keep items that you use often in easy-to-reach places. Lower the shelves around  your home if needed. Move furniture so that there are clear paths around it. Do not keep throw rugs or other things on the floor that can make you trip. If any of your floors are uneven, fix them. Add color or contrast paint or tape to clearly mark and help you see: Grab bars or handrails. First and last steps of  staircases. Where the edge of each step is. If you use a ladder or stepladder: Make sure that it is fully opened. Do not climb a closed ladder. Make sure the sides of the ladder are locked in place. Have someone hold the ladder while you use it. Know where your pets are as you move through your home. What can I do in the bathroom?     Keep the floor dry. Clean up any water  that is on the floor right away. Remove soap buildup in the bathtub or shower. Buildup makes bathtubs and showers slippery. Use non-skid mats or decals on the floor of the bathtub or shower. Attach bath mats securely with double-sided, non-slip rug tape. If you need to sit down while you are in the shower, use a non-slip stool. Install grab bars by the toilet and in the bathtub and shower. Do not use towel bars as grab bars. What can I do in the bedroom? Make sure that you have a light by your bed that is easy to reach. Do not use any sheets or blankets on your bed that hang to the floor. Have a firm bench or chair with side arms that you can use for support when you get dressed. What can I do in the kitchen? Clean up any spills right away. If you need to reach something above you, use a sturdy step stool that has a grab bar. Keep electrical cables out of the way. Do not use floor polish or wax that makes floors slippery. What can I do with my stairs? Do not leave anything on the stairs. Make sure that you have a light switch at the top and the bottom of the stairs. Have them installed if you do not have them. Make sure that there are handrails on both sides of the stairs. Fix handrails that are broken or loose. Make sure that handrails are as long as the staircases. Install non-slip stair treads on all stairs in your home if they do not have carpet. Avoid having throw rugs at the top or bottom of stairs, or secure the rugs with carpet tape to prevent them from moving. Choose a carpet design that does not hide the  edge of steps on the stairs. Make sure that carpet is firmly attached to the stairs. Fix any carpet that is loose or worn. What can I do on the outside of my home? Use bright outdoor lighting. Repair the edges of walkways and driveways and fix any cracks. Clear paths of anything that can make you trip, such as tools or rocks. Add color or contrast paint or tape to clearly mark and help you see high doorway thresholds. Trim any bushes or trees on the main path into your home. Check that handrails are securely fastened and in good repair. Both sides of all steps should have handrails. Install guardrails along the edges of any raised decks or porches. Have leaves, snow, and ice cleared regularly. Use sand, salt, or ice melt on walkways during winter months if you live where there is ice and snow. In the garage, clean up any spills right away,  including grease or oil spills. What other actions can I take? Review your medicines with your health care provider. Some medicines can make you confused or feel dizzy. This can increase your chance of falling. Wear closed-toe shoes that fit well and support your feet. Wear shoes that have rubber soles and low heels. Use a cane, walker, scooter, or crutches that help you move around if needed. Talk with your provider about other ways that you can decrease your risk of falls. This may include seeing a physical therapist to learn to do exercises to improve movement and strength. Where to find more information Centers for Disease Control and Prevention, STEADI: TonerPromos.no [employer] on Aging: BaseRingTones.pl National Institute on Aging: BaseRingTones.pl Contact a health care provider if: You are afraid of falling at home. You feel weak, drowsy, or dizzy at home. You fall at home. Get help right away if you: Lose consciousness or have trouble moving after a fall. Have a fall that causes a head injury. These symptoms may be an emergency. Get help right away. Call  911. Do not wait to see if the symptoms will go away. Do not drive yourself to the hospital. This information is not intended to replace advice given to you by your health care provider. Make sure you discuss any questions you have with your health care provider. Document Revised: 07/02/2022 Document Reviewed: 07/02/2022 Elsevier Patient Education  2024 [employer].

## 2024-08-11 DIAGNOSIS — J310 Chronic rhinitis: Secondary | ICD-10-CM | POA: Diagnosis not present

## 2024-08-26 DIAGNOSIS — C44622 Squamous cell carcinoma of skin of right upper limb, including shoulder: Secondary | ICD-10-CM | POA: Diagnosis not present

## 2024-08-26 DIAGNOSIS — L570 Actinic keratosis: Secondary | ICD-10-CM | POA: Diagnosis not present

## 2024-08-26 DIAGNOSIS — D0471 Carcinoma in situ of skin of right lower limb, including hip: Secondary | ICD-10-CM | POA: Diagnosis not present

## 2024-09-07 ENCOUNTER — Telehealth: Admitting: Family Medicine

## 2024-09-07 DIAGNOSIS — R300 Dysuria: Secondary | ICD-10-CM

## 2024-09-07 NOTE — Progress Notes (Signed)
  Because you have the tickling when going, we should have a sample collected this time. As we get older there are more complications with treating a UTI without a sample collected first. We feel your condition warrants further evaluation and recommend that you be seen in a face-to-face visit at your PCP office or local urgent care.    NOTE: There will be NO CHARGE for this E-Visit   If you are having a true medical emergency, please call 911.

## 2024-09-08 DIAGNOSIS — N3001 Acute cystitis with hematuria: Secondary | ICD-10-CM | POA: Diagnosis not present

## 2024-09-10 DIAGNOSIS — L814 Other melanin hyperpigmentation: Secondary | ICD-10-CM | POA: Diagnosis not present

## 2024-09-10 DIAGNOSIS — L570 Actinic keratosis: Secondary | ICD-10-CM | POA: Diagnosis not present

## 2024-09-10 DIAGNOSIS — D485 Neoplasm of uncertain behavior of skin: Secondary | ICD-10-CM | POA: Diagnosis not present

## 2024-09-10 DIAGNOSIS — D1801 Hemangioma of skin and subcutaneous tissue: Secondary | ICD-10-CM | POA: Diagnosis not present

## 2024-09-10 DIAGNOSIS — L821 Other seborrheic keratosis: Secondary | ICD-10-CM | POA: Diagnosis not present

## 2024-09-10 DIAGNOSIS — Z08 Encounter for follow-up examination after completed treatment for malignant neoplasm: Secondary | ICD-10-CM | POA: Diagnosis not present

## 2024-09-10 DIAGNOSIS — L815 Leukoderma, not elsewhere classified: Secondary | ICD-10-CM | POA: Diagnosis not present

## 2024-09-10 DIAGNOSIS — X32XXXA Exposure to sunlight, initial encounter: Secondary | ICD-10-CM | POA: Diagnosis not present

## 2024-09-10 DIAGNOSIS — Z85828 Personal history of other malignant neoplasm of skin: Secondary | ICD-10-CM | POA: Diagnosis not present

## 2024-09-25 ENCOUNTER — Other Ambulatory Visit: Payer: Self-pay | Admitting: Internal Medicine

## 2024-09-25 DIAGNOSIS — F5101 Primary insomnia: Secondary | ICD-10-CM

## 2024-09-25 DIAGNOSIS — F411 Generalized anxiety disorder: Secondary | ICD-10-CM

## 2024-09-25 DIAGNOSIS — I10 Essential (primary) hypertension: Secondary | ICD-10-CM

## 2024-09-25 DIAGNOSIS — K5901 Slow transit constipation: Secondary | ICD-10-CM

## 2024-09-25 DIAGNOSIS — E782 Mixed hyperlipidemia: Secondary | ICD-10-CM

## 2024-09-25 NOTE — Telephone Encounter (Signed)
 Copied from CRM #[id number]. Topic: Clinical - Medication Refill >> Sep 25, 2024  9:09 AM Montie POUR wrote: Medication:  zolpidem  (AMBIEN ) 10 MG tablet ALPRAZolam  (XANAX ) 0.25 MG tablet  ammonium lactate (LAC-HYDRIN) 12 % lotion FLUoxetine  (PROZAC ) 20 MG capsule Plecanatide  (TRULANCE ) 3 MG TABS rosuvastatin  (CRESTOR ) 5 MG tablet triamterene -hydrochlorothiazide  (MAXZIDE -25) 37.5-25 MG tablet   Has the patient contacted their pharmacy? Yes (Agent: If no, request that the patient contact the pharmacy for the refill. If patient does not wish to contact the pharmacy document the reason why and proceed with request.) (Agent: If yes, when and what did the pharmacy advise?) Pharmacy needs order to refill  This is the patient's preferred pharmacy:  [facility] - [place], [place] - [address] [facility] [address] [place] [address] [place] [address] Phone: [phone] Fax: [phone]  Is this the correct pharmacy for this prescription? Yes If no, delete pharmacy and type the correct one.   Has the prescription been filled recently? No  Is the patient out of the medication? No  Has the patient been seen for an appointment in the last year OR does the patient have an upcoming appointment? Yes  Can we respond through MyChart? Yes  Agent: Please be advised that Rx refills may take up to 3 business days. We ask that you follow-up with your pharmacy.

## 2024-09-27 MED ORDER — ROSUVASTATIN CALCIUM 5 MG PO TABS: 5.0000 mg | ORAL_TABLET | Freq: Every day | ORAL | 1 refills | Status: AC

## 2024-09-27 MED ORDER — FLUOXETINE HCL 20 MG PO CAPS: 20.0000 mg | ORAL_CAPSULE | Freq: Every day | ORAL | 0 refills | Status: AC

## 2024-09-27 NOTE — Telephone Encounter (Signed)
 Requested medications are due for refill today.  Ambien  and xanax  are due. Unsure about Lac-hydrin  Requested medications are on the active medications list.  yes  Last refill. Xanax  06/30/2024 #20 1 rf, ambien  07/08/2024 #90 0 rf, Lac-hydrin 01/30/2024  Future visit scheduled.   yes  Notes to clinic.  2 refills not delegated, One medication is historical    Requested Prescriptions  Pending Prescriptions Disp Refills   zolpidem  (AMBIEN ) 10 MG tablet 90 tablet 0    Sig: Take 1 tablet (10 mg total) by mouth at bedtime.     Not Delegated - Psychiatry:  Anxiolytics/Hypnotics Failed - 09/27/2024  1:25 PM      Failed - This refill cannot be delegated      Failed - Urine Drug Screen completed in last 360 days      Passed - Valid encounter within last 6 months    Recent Outpatient Visits           4 months ago Essential (primary) hypertension   [place] [facility] at [facility], Leita DEL, MD       Future Appointments             In 1 week Loistine Sober, NP [place] [facility]             ALPRAZolam  (XANAX ) 0.25 MG tablet 20 tablet 1     Not Delegated - Psychiatry: Anxiolytics/Hypnotics 2 Failed - 09/27/2024  1:25 PM      Failed - This refill cannot be delegated      Failed - Urine Drug Screen completed in last 360 days      Passed - Patient is not pregnant      Passed - Valid encounter within last 6 months    Recent Outpatient Visits           4 months ago Essential (primary) hypertension   [place] [facility] at [facility], Leita DEL, MD       Future Appointments             In 1 week Loistine Sober, NP [place] [facility]             ammonium lactate (LAC-HYDRIN) 12 % lotion 400 g      Dermatology:  Other Passed - 09/27/2024  1:25 PM      Passed - Valid encounter within last 12 months    Recent Outpatient Visits           4 months  ago Essential (primary) hypertension   [place] [facility] at [facility], Leita DEL, MD       Future Appointments             In 1 week Loistine Sober, NP [place] [facility] at [facility]            Signed Prescriptions Disp Refills   FLUoxetine  (PROZAC ) 20 MG capsule 100 capsule 0    Sig: Take 1 capsule (20 mg total) by mouth daily.     Psychiatry:  Antidepressants - SSRI Passed - 09/27/2024  1:25 PM      Passed - Valid encounter within last 6 months    Recent Outpatient Visits           4 months ago Essential (primary) hypertension   [place] [facility] at [facility], Leita DEL, MD  Future Appointments             In 1 week Wittenborn, Barnie, NP [place] [facility] at [facility]             rosuvastatin  (CRESTOR ) 5 MG tablet 100 tablet 1    Sig: Take 1 tablet (5 mg total) by mouth daily.     Cardiovascular:  Antilipid - Statins 2 Failed - 09/27/2024  1:25 PM      Failed - Lipid Panel in normal range within the last 12 months    Cholesterol, Total  Date Value Ref Range Status  05/13/2024 162 100 - 199 mg/dL Final   LDL Chol Calc (NIH)  Date Value Ref Range Status  05/13/2024 77 0 - 99 mg/dL Final   HDL  Date Value Ref Range Status  05/13/2024 68 >39 mg/dL Final   Triglycerides  Date Value Ref Range Status  05/13/2024 91 0 - 149 mg/dL Final         Passed - Cr in normal range and within 360 days    Creatinine  Date Value Ref Range Status  05/11/2014 1.02 0.60 - 1.30 mg/dL Final   Creatinine, Ser  Date Value Ref Range Status  05/13/2024 0.73 0.57 - 1.00 mg/dL Final         Passed - Patient is not pregnant      Passed - Valid encounter within last 12 months    Recent Outpatient Visits           4 months ago Essential (primary) hypertension   [place] [facility] at [facility], Leita DEL, MD       Future  Appointments             In 1 week Wittenborn, Barnie, NP [place] [facility] at [facility] Prescriptions Disp Refills   Plecanatide  (TRULANCE ) 3 MG TABS 100 tablet 3    Sig: Take 1 tablet (3 mg total) by mouth daily.     Off-Protocol Failed - 09/27/2024  1:25 PM      Failed - Medication not assigned to a protocol, review manually.      Passed - Valid encounter within last 12 months    Recent Outpatient Visits           4 months ago Essential (primary) hypertension   [place] [facility] at [facility], Leita DEL, MD       Future Appointments             In 1 week Wittenborn, Barnie, NP [place] [facility] at [facility]             triamterene -hydrochlorothiazide  (MAXZIDE -25) 37.5-25 MG tablet 100 tablet 3    Sig: Take 1 tablet by mouth daily.     Cardiovascular: Diuretic Combos Passed - 09/27/2024  1:25 PM      Passed - K in normal range and within 180 days    Potassium  Date Value Ref Range Status  05/13/2024 4.5 3.5 - 5.2 mmol/L Final  05/11/2014 3.5 3.5 - 5.1 mmol/L Final         Passed - Na in normal range and within 180 days    Sodium  Date Value Ref Range Status  05/13/2024 140 134 - 144 mmol/L Final  05/11/2014 137 136 - 145 mmol/L Final         Passed - Cr in normal  range and within 180 days    Creatinine  Date Value Ref Range Status  05/11/2014 1.02 0.60 - 1.30 mg/dL Final   Creatinine, Ser  Date Value Ref Range Status  05/13/2024 0.73 0.57 - 1.00 mg/dL Final         Passed - Last BP in normal range    BP Readings from Last 1 Encounters:  05/20/24 117/82         Passed - Valid encounter within last 6 months    Recent Outpatient Visits           4 months ago Essential (primary) hypertension   [place] [facility] at [facility], Leita DEL, MD       Future Appointments             In 1 week Wittenborn, Barnie, NP [facility]  at [facility]

## 2024-09-27 NOTE — Telephone Encounter (Signed)
 Requested Prescriptions  Pending Prescriptions Disp Refills   zolpidem  (AMBIEN ) 10 MG tablet 90 tablet 0    Sig: Take 1 tablet (10 mg total) by mouth at bedtime.     Not Delegated - Psychiatry:  Anxiolytics/Hypnotics Failed - 09/27/2024  1:23 PM      Failed - This refill cannot be delegated      Failed - Urine Drug Screen completed in last 360 days      Passed - Valid encounter within last 6 months    Recent Outpatient Visits           4 months ago Essential (primary) hypertension   [place] [facility] at [facility], Leita DEL, MD       Future Appointments             In 1 week Loistine Sober, NP [place] [facility] at [facility]             ALPRAZolam  (XANAX ) 0.25 MG tablet 20 tablet 1     Not Delegated - Psychiatry: Anxiolytics/Hypnotics 2 Failed - 09/27/2024  1:23 PM      Failed - This refill cannot be delegated      Failed - Urine Drug Screen completed in last 360 days      Passed - Patient is not pregnant      Passed - Valid encounter within last 6 months    Recent Outpatient Visits           4 months ago Essential (primary) hypertension   [place] [facility] at [facility], Leita DEL, MD       Future Appointments             In 1 week Loistine Sober, NP [place] [facility] at [facility]             ammonium lactate (LAC-HYDRIN) 12 % lotion 400 g      Dermatology:  Other Passed - 09/27/2024  1:23 PM      Passed - Valid encounter within last 12 months    Recent Outpatient Visits           4 months ago Essential (primary) hypertension   [place] [facility] at [facility], Leita DEL, MD       Future Appointments             In 1 week Loistine Sober, NP [place] [facility] at [facility]             FLUoxetine  (PROZAC ) 20 MG capsule 100 capsule 1    Sig: Take 1 capsule (20 mg total) by mouth daily.      Psychiatry:  Antidepressants - SSRI Passed - 09/27/2024  1:23 PM      Passed - Valid encounter within last 6 months    Recent Outpatient Visits           4 months ago Essential (primary) hypertension   [place] [facility] at [facility], Leita DEL, MD       Future Appointments             In 1 week Loistine Sober, NP  [facility] at [facility]             Plecanatide  (TRULANCE ) 3 MG TABS 100 tablet 3    Sig: Take 1 tablet (3 mg total) by mouth daily.     Off-Protocol Failed - 09/27/2024  1:23 PM      Failed - Medication not assigned to a protocol, review manually.      Passed - Valid encounter within last 12 months    Recent Outpatient Visits           4 months ago Essential (primary) hypertension   [place] [facility] at [facility], Leita DEL, MD       Future Appointments             In 1 week Wittenborn, Barnie, NP [place] [facility] at [facility]             rosuvastatin  (CRESTOR ) 5 MG tablet 100 tablet 3    Sig: Take 1 tablet (5 mg total) by mouth daily.     Cardiovascular:  Antilipid - Statins 2 Failed - 09/27/2024  1:23 PM      Failed - Lipid Panel in normal range within the last 12 months    Cholesterol, Total  Date Value Ref Range Status  05/13/2024 162 100 - 199 mg/dL Final   LDL Chol Calc (NIH)  Date Value Ref Range Status  05/13/2024 77 0 - 99 mg/dL Final   HDL  Date Value Ref Range Status  05/13/2024 68 >39 mg/dL Final   Triglycerides  Date Value Ref Range Status  05/13/2024 91 0 - 149 mg/dL Final         Passed - Cr in normal range and within 360 days    Creatinine  Date Value Ref Range Status  05/11/2014 1.02 0.60 - 1.30 mg/dL Final   Creatinine, Ser  Date Value Ref Range Status  05/13/2024 0.73 0.57 - 1.00 mg/dL Final         Passed - Patient is not pregnant      Passed - Valid encounter within last 12 months    Recent Outpatient  Visits           4 months ago Essential (primary) hypertension   [place] [facility] at [facility], Leita DEL, MD       Future Appointments             In 1 week Wittenborn, Barnie, NP [place] [facility] at [facility]             triamterene -hydrochlorothiazide  (MAXZIDE -25) 37.5-25 MG tablet 100 tablet 3    Sig: Take 1 tablet by mouth daily.     Cardiovascular: Diuretic Combos Passed - 09/27/2024  1:23 PM      Passed - K in normal range and within 180 days    Potassium  Date Value Ref Range Status  05/13/2024 4.5 3.5 - 5.2 mmol/L Final  05/11/2014 3.5 3.5 - 5.1 mmol/L Final         Passed - Na in normal range and within 180 days    Sodium  Date Value Ref Range Status  05/13/2024 140 134 - 144 mmol/L Final  05/11/2014 137 136 - 145 mmol/L Final         Passed - Cr in normal range and within 180 days    Creatinine  Date Value Ref Range Status  05/11/2014 1.02 0.60 - 1.30 mg/dL Final   Creatinine, Ser  Date Value Ref Range Status  05/13/2024 0.73 0.57 - 1.00 mg/dL Final         Passed - Last BP in normal range    BP Readings from Last 1 Encounters:  05/20/24 117/82  Passed - Valid encounter within last 6 months    Recent Outpatient Visits           4 months ago Essential (primary) hypertension   [place] [facility] at [facility], Leita DEL, MD       Future Appointments             In 1 week Wittenborn, Barnie, NP [facility]

## 2024-09-28 ENCOUNTER — Other Ambulatory Visit: Payer: Self-pay | Admitting: Internal Medicine

## 2024-09-28 MED ORDER — ALPRAZOLAM 0.25 MG PO TABS: 0.2500 mg | ORAL_TABLET | Freq: Every evening | ORAL | 1 refills | Status: DC | PRN

## 2024-09-28 MED ORDER — ZOLPIDEM TARTRATE 10 MG PO TABS: 10.0000 mg | ORAL_TABLET | Freq: Every day | ORAL | 0 refills | Status: DC

## 2024-09-28 NOTE — Progress Notes (Unsigned)
 Date:  09/28/2024   Name:  Tracy Chase   DOB:  1956/03/22   MRN:  [id number]   Chief Complaint: No chief complaint on file.  HPI  Review of Systems   Lab Results  Component Value Date   NA 140 05/13/2024   K 4.5 05/13/2024   CO2 22 05/13/2024   GLUCOSE 81 05/13/2024   BUN 5 (L) 05/13/2024   CREATININE 0.73 05/13/2024   CALCIUM  9.9 05/13/2024   EGFR 88 05/13/2024   GFRNONAA >60 05/09/2024   Lab Results  Component Value Date   CHOL 162 05/13/2024   HDL 68 05/13/2024   LDLCALC 77 05/13/2024   TRIG 91 05/13/2024   CHOLHDL 2.4 05/13/2024   Lab Results  Component Value Date   TSH 3.070 11/14/2023   Lab Results  Component Value Date   HGBA1C 5.3 11/01/2021   Lab Results  Component Value Date   WBC 4.2 05/08/2024   HGB 10.8 (L) 05/08/2024   HCT 32.9 (L) 05/08/2024   MCV 91.4 05/08/2024   PLT 130 (L) 05/08/2024   Lab Results  Component Value Date   ALT 64 (H) 05/13/2024   AST 33 05/13/2024   ALKPHOS 151 (H) 05/13/2024   BILITOT 0.4 05/13/2024   No results found for: MARIEN BOLLS, VD25OH   Patient Active Problem List   Diagnosis Date Noted   Cholecystitis 05/07/2024   Hiatal hernia 11/14/2023   Constipation due to slow transit 08/27/2023   Acute pain of left shoulder 08/21/2023   Abnormal EKG 08/21/2023   Hemoperitoneum 08/21/2023   Recurrent incisional hernia 01/10/2023   Rib lesion 08/09/2022   Trigger index finger of right hand 11/01/2021   Atherosclerosis of aorta 10/17/2021   S/P TKR (total knee replacement) using cement, left 02/21/2021   Mixed hyperlipidemia 09/25/2019   Colon cancer screening    Polyp of colon    Degenerative disc disease, cervical 11/18/2017   Generalized anxiety disorder 06/21/2015   History of renal cell cancer 06/21/2015   Carpal tunnel syndrome 06/21/2015   Essential (primary) hypertension 06/21/2015   Idiopathic insomnia 06/21/2015    Allergies  Allergen Reactions   Hydrocodone  Itching    Past  Surgical History:  Procedure Laterality Date   ABDOMINAL HYSTERECTOMY     APPENDECTOMY  2011   Lap converted to exlap appendectomy, drainage of abd abscesses   CESAREAN SECTION     CHOLECYSTECTOMY, ROBOT-ASSISTED, LAPAROSCOPIC  05/08/2024   COLONOSCOPY WITH PROPOFOL  N/A 08/28/2019   Procedure: COLONOSCOPY WITH PROPOFOL ;  Surgeon: Janalyn Keene NOVAK, MD;  Location: [facility] ENDOSCOPY;  Service: Endoscopy;  Laterality: N/A;   COLONOSCOPY WITH PROPOFOL  N/A 08/21/2023   Procedure: COLONOSCOPY WITH PROPOFOL ;  Surgeon: Therisa Bi, MD;  Location: [facility] ENDOSCOPY;  Service: Gastroenterology;  Laterality: N/A;   cryoablation for renal cell carcinoma  2012   ESOPHAGOGASTRODUODENOSCOPY (EGD) WITH PROPOFOL  N/A 08/21/2023   Procedure: ESOPHAGOGASTRODUODENOSCOPY (EGD) WITH PROPOFOL ;  Surgeon: Therisa Bi, MD;  Location: [facility] ENDOSCOPY;  Service: Gastroenterology;  Laterality: N/A;   ETHMOIDECTOMY Left 09/06/2020   Procedure: TOTAL ETHMOIDECTOMY;  Surgeon: Blair Mt, MD;  Location: [facility] SUR[facility]ERY [facility]TR;  Service: ENT;  Laterality: Left;   FRONTAL SINUS EXPLORATION Left 09/06/2020   Procedure: FRONTAL SINUS EXPLORATION;  Surgeon: Blair Mt, MD;  Location: [facility] SURGERY CNTR;  Service: ENT;  Laterality: Left;   HERNIA REPAIR     IMAGE GUIDED SINUS SURGERY N/A 09/06/2020   Procedure: IMAGE GUIDED SINUS SURGERY;  Surgeon: Blair Mt, MD;  Location: [facility] SURGERY  CNTR;  Service: ENT;  Laterality: N/A;  need stryker disk PLACE DISK ON OR CHARGE NURSE DESK 10-01 KP   INSERTION OF MESH  01/10/2023   Procedure: INSERTION OF MESH;  Surgeon: Desiderio Schanz, MD;  Location: [facility] ORS;  Service: General;;   JOINT REPLACEMENT Left    partial knee replacement   LUMBAR FUSION  1994   MAXILLARY ANTROSTOMY Left 09/06/2020   Procedure: MAXILLARY ANTROSTOMY WITH TISSUE;  Surgeon: Blair Mt, MD;  Location: [facility] SURGERY CNTR;  Service: ENT;  Laterality: Left;   MEDIAL PARTIAL KNEE REPLACEMENT Left     SPHENOIDECTOMY Left 09/06/2020   Procedure: SPHENOIDECTOMY;  Surgeon: Blair Mt, MD;  Location: [facility] SURGERY CNTR;  Service: ENT;  Laterality: Left;   SPINE SURGERY     TONSILLECTOMY     TOTAL KNEE REVISION Left 02/21/2021   Procedure: Revision, left partial to total knee;  Surgeon: Kathlynn Sharper, MD;  Location: [facility] ORS;  Service: Orthopedics;  Laterality: Left;  CHRIS GAINES TO ASSIST   TUBAL LIGATION     VENTRAL HERNIA REPAIR  2012   open ventral hernia repair with mesh   XI ROBOTIC ASSISTED VENTRAL HERNIA N/A 01/10/2023   Procedure: XI ROBOTIC ASSISTED VENTRAL HERNIA WITH LYSIS OF ADHESIONS;  Surgeon: Desiderio Schanz, MD;  Location: [facility] ORS;  Service: General;  Laterality: N/A;    Social History   Tobacco Use   Smoking status: Former    Current packs/day: 0.00    Types: Cigarettes    Quit date: 1990    Years since quitting: 35.9    Passive exposure: Past   Smokeless tobacco: Never   Tobacco comments:    was social Smoker when in [place] and early 20s  Vaping Use   Vaping status: Never Used  Substance Use Topics   Alcohol use: Yes    Comment: 1-2 drinks 2-3 times weekly   Drug use: No     Medication list has been reviewed and updated.  No outpatient medications have been marked as taking for the 09/28/24 encounter (Orders Only) with Justus Leita DEL, MD.       05/13/2024    8:23 AM 08/27/2023    1:51 PM 07/04/2023   11:32 AM 05/20/2023    1:24 PM  GAD 7 : Generalized Anxiety Score  Nervous, Anxious, on Edge 3 3 0 3  Control/stop worrying 3 3 0 3  Worry too much - different things 3 3 0 3  Trouble relaxing 3 3 0 0  Restless 3 3 0 0  Easily annoyed or irritable 3 3 1 2   Afraid - awful might happen 3 3 1  0  Total GAD 7 Score 21 21 2 11   Anxiety Difficulty Extremely difficult Extremely difficult Not difficult at all Not difficult at all       08/05/2024    2:47 PM 05/13/2024    8:23 AM 08/27/2023    1:51 PM  Depression screen PHQ 2/9  Decreased  Interest 1 1 1   Down, Depressed, Hopeless 1 1 1   PHQ - 2 Score 2 2 2   Altered sleeping 1 1 0  Tired, decreased energy 1 3 2   Change in appetite 0 3 2  Feeling bad or failure about yourself  0 0 0  Trouble concentrating 0 0 0  Moving slowly or fidgety/restless 0 0 0  Suicidal thoughts 0 0 0  PHQ-9 Score 4  9  6    Difficult doing work/chores Not difficult at all Not difficult at all  Not difficult at all     Data saved with a previous flowsheet row definition    BP Readings from Last 3 Encounters:  05/20/24 117/82  05/13/24 124/78  05/09/24 (!) 146/69    Physical Exam  Wt Readings from Last 3 Encounters:  08/05/24 170 lb (77.1 kg)  05/20/24 164 lb (74.4 kg)  05/13/24 168 lb (76.2 kg)    There were no vitals taken for this visit.  Assessment and Plan:  Problem List Items Addressed This Visit   None   No follow-ups on file.    Leita HILARIO Adie, MD [facility] [place]

## 2024-09-28 NOTE — Telephone Encounter (Signed)
 Please review.  KP

## 2024-10-02 NOTE — Progress Notes (Unsigned)
 Cardiology Clinic Note   Date: 10/06/2024 ID: Dezarai, Prew 1956/03/11, MRN [id number]  Primary Cardiologist:  Redell Cave, MD  Chief Complaint   Tracy Chase is a 68 y.o. female who presents to the clinic today for routine follow up.   Patient Profile   Tracy Chase is followed by Dr. Cave for the history outlined below.      Past medical history significant for: Chest pain.  Echo 10/25/2020: EF 60-65%. No RWMA. Mild LVH. Grade I DD. Mildly reduced RV function. Trivial MR. Mild calcification/thickening of the aortic valve without stenosis. Coronary CTA 11/03/2020: Coronary calcium  score of 0 with no evidence of CAD. Hypertension.  Hyperlipidemia. Lipid panel 05/13/2024: LDL 77, HDL 68, TG 91, total 162.  In summary, was first evaluated by Dr. Cave on 10/14/2020 for chest pain at the request of Dr. Justus.  Patient reported a 86-month history of worsening chest pain described as central tightness not related to exertion and sometimes related to stress.  She reported a family history of MI in both parents, mom age 23 when she had her first MI.  Noted increase stress at home secondary to her husband having an MI.  Patient underwent echo and coronary CTA (details above).   Patient was evaluated for abnormal EKG during hospital admission for colonoscopy on 08/21/2023. Patient underwent uneventful colonoscopy demonstrating diverticulitis with no intervention performed. Upon awakening from anesthesia she complained of left shoulder pain which was new for her. Of note she was positioned in the left lateral decubitus position during the procedure. She denied chest pain, shortness of breath, palpitations, or nausea. She also reported some left-sided abdominal pain. EKG showed sinus bradycardia with poor R wave progression and anteroseptal T wave inversions. This felt pain most likely musculoskeletal but could also be referred pain from intra-abdominal process. Nonspecific  EKG changes could be attributed to difference in lead placement or electrolyte abnormalities. WBC 2.8, hemoglobin 11, potassium 3.2, calcium  8.3. Troponin negative x 2. On review of labs and in the setting of improved left shoulder pain and no chest pain no further testing/intervention recommended. Patient instructed to follow-up with cardiology as an outpatient.   Patient was last seen in the office by me on 09/26/2023 for hospital follow-up.  She reported episodes of central chest tightness occurring during high stress lasting minutes and resolving with controlled breathing.  Episodes have been occurring for 6 months around the time her dad moved in with her.  She also described palpitations described as fast and hard heart beat that occurs randomly lasting a few minutes also controlled with deep breathing.  Patient reported being active walking 2-1/2 miles daily without chest tightness or shortness of breath.  She reported 2 episodes of brief blackouts where she could not see or hear but did not lose consciousness.  She was instructed to follow-up with primary care regarding those episodes.  Ischemic evaluation was not pursued given patient's symptoms of chest tightness occurred with anxiety/stress.  No medication changes were made.     History of Present Illness    Today, patient continues to experience chest tightness and dyspnea. Pain is described as central tightness that typically occurs during times of stress or emotional upset and resolve with deep breathing. She cares for her 33 year old dad who lives with her so her stress level tends to be high. She is active walking for exercise typically without symptoms. She also has episodes of palpitations related to stress that feel like her  heart is beating hard and fast lasting a few minutes and resolving with deep breathing. She reports lower extremities are chronically edematous dating back to when she was a teenager. No orthopnea or PND.     ROS:  All other systems reviewed and are otherwise negative except as noted in History of Present Illness.  EKGs/Labs Reviewed    EKG Interpretation Date/Time:  Tuesday October 06 2024 13:13:58 EST Ventricular Rate:  56 PR Interval:  154 QRS Duration:  78 QT Interval:  456 QTC Calculation: 440 R Axis:   -12  Text Interpretation: Sinus bradycardia Low voltage QRS Nonspecific ST and T wave abnormality When compared with ECG of 07-May-2024 04:00, PREVIOUS ECG IS PRESENT Confirmed by Loistine Sober [phone]) on 10/06/2024 1:19:55 PM   05/13/2024: ALT 64; AST 33; BUN 5; Creatinine, Ser 0.73; Potassium 4.5; Sodium 140   05/08/2024: Hemoglobin 10.8; WBC 4.2   11/14/2023: TSH 3.070     Physical Exam    VS:  BP 128/78   Pulse (!) 56   Ht 5' 6 (1.676 m)   Wt 176 lb (79.8 kg)   SpO2 95%   BMI 28.41 kg/m  , BMI Body mass index is 28.41 kg/m.  GEN: Well nourished, well developed, in no acute distress. Neck: No JVD or carotid bruits. Cardiac:  RRR.  No murmur. No rubs or gallops.   Respiratory:  Respirations regular and unlabored. Clear to auscultation without rales, wheezing or rhonchi. GI: Soft, nontender, nondistended. Extremities: Radials/DP/PT 2+ and equal bilaterally. No clubbing or cyanosis. Mild, nonpitting lower extremity edema bilaterally.   Skin: Warm and dry, no rash. Neuro: Strength intact.  Assessment & Plan   Chest Pressure/Anxiety/Shortness of breath Patient reports continued episodes of central chest tightness and shortness of breath typically related to stress. She walks for exercise without shortness of breath or chest tightness. EKG demonstrates nonspecific ST and T wave abnormalities seen previously. Given persistence of symptoms patient agrees to ischemic evaluation.  - Schedule coronary CTA and echo.  - BMP today.    Palpitations Patient reports feeling her heart is racing and beating hard during times of stress. EKG today shows sinus bradycardia. Will defer  further testing for now.  - Instructed to contact the office for more persistent or prolonged episodes of palpitations.   Hypertension BP today 128/78. No headaches or dizziness reported.  - Continue Maxzide .   Hyperlipidemia LDL 77 July 2025, at goal. - Continue rosuvastatin .  Disposition: Coronary CTA and echo. BMP today. Return in 1 year or sooner as needed.          Signed, Sober HERO. Karolynn Infantino, DNP, NP-C

## 2024-10-06 ENCOUNTER — Ambulatory Visit: Attending: Student | Admitting: Student

## 2024-10-06 ENCOUNTER — Encounter: Payer: Self-pay | Admitting: Student

## 2024-10-06 VITALS — BP 128/78 | HR 56 | Ht 66.0 in | Wt 176.0 lb

## 2024-10-06 DIAGNOSIS — I700 Atherosclerosis of aorta: Secondary | ICD-10-CM | POA: Diagnosis not present

## 2024-10-06 DIAGNOSIS — E7800 Pure hypercholesterolemia, unspecified: Secondary | ICD-10-CM

## 2024-10-06 DIAGNOSIS — F419 Anxiety disorder, unspecified: Secondary | ICD-10-CM

## 2024-10-06 DIAGNOSIS — I10 Essential (primary) hypertension: Secondary | ICD-10-CM | POA: Diagnosis not present

## 2024-10-06 DIAGNOSIS — R072 Precordial pain: Secondary | ICD-10-CM | POA: Diagnosis not present

## 2024-10-06 DIAGNOSIS — R0602 Shortness of breath: Secondary | ICD-10-CM

## 2024-10-06 MED ORDER — METOPROLOL TARTRATE 25 MG PO TABS: ORAL_TABLET | ORAL | 0 refills | Status: DC

## 2024-10-06 NOTE — Patient Instructions (Addendum)
 Medication Instructions:  Your physician recommends that you continue on your current medications as directed. Please refer to the Current Medication list given to you today.  *If you need a refill on your cardiac medications before your next appointment, please call your pharmacy*  Lab Work: TODAY: BMET If you have labs (blood work) drawn today and your tests are completely normal, you will receive your results only by: MyChart Message (if you have MyChart) OR A paper copy in the mail If you have any lab test that is abnormal or we need to change your treatment, we will call you to review the results.  Testing/Procedures: Your physician has requested that you have an echocardiogram. Echocardiography is a painless test that uses sound waves to create images of your heart. It provides your doctor with information about the size and shape of your heart and how well your heart's chambers and valves are working. This procedure takes approximately one hour. There are no restrictions for this procedure. Please do NOT wear cologne, perfume, aftershave, or lotions (deodorant is allowed). Please arrive 15 minutes prior to your appointment time.  Please note: We ask at that you not bring children with you during ultrasound (echo/ vascular) testing. Due to room size and safety concerns, children are not allowed in the ultrasound rooms during exams. Our front office staff cannot provide observation of children in our lobby area while testing is being conducted. An adult accompanying a patient to their appointment will only be allowed in the ultrasound room at the discretion of the ultrasound technician under special circumstances. We apologize for any inconvenience.   Follow-Up: At [facility], you and your health needs are our priority.  As part of our continuing mission to provide you with exceptional heart care, our providers are all part of one team.  This team includes your primary  Cardiologist (physician) and Advanced Practice Providers or APPs (Physician Assistants and Nurse Practitioners) who all work together to provide you with the care you need, when you need it.  Your next appointment:   1 year(s)  Provider:   Redell Cave, MD or Barnie Hila, NP    We recommend signing up for the patient portal called MyChart.  Sign up information is provided on this After Visit Summary.  MyChart is used to connect with patients for Virtual Visits (Telemedicine).  Patients are able to view lab/test results, encounter notes, upcoming appointments, etc.  Non-urgent messages can be sent to your provider as well.   To learn more about what you can do with MyChart, go to [email or web address].   Other Instructions   Your cardiac CT will be scheduled at one of the below locations:   [facility] [address] [place], [place] [address] [phone]   If scheduled at [facility], please arrive to the Heart and Vascular Center 15 mins early for check-in and test prep.  There is spacious parking and easy access to the radiology department from the [facility] entrance. Please enter here and check-in with the desk attendant.    Please follow these instructions carefully (unless otherwise directed):  An IV will be required for this test and Nitroglycerin  will be given.  Hold all erectile dysfunction medications at least 3 days (72 hrs) prior to test. (Ie viagra, cialis, sildenafil, tadalafil, etc)   On the Night Before the Test: Be sure to Drink plenty of water . Do not consume any caffeinated/decaffeinated beverages or chocolate 12 hours prior  to your test. Do not take any antihistamines 12 hours prior to your test.  On the Day of the Test: Drink plenty of water  until 1 hour prior to the test. Do not eat any food 1 hour prior to test. You may take your regular medications prior to the test.  Take  metoprolol  (Lopressor ) two hours prior to test. If you take Furosemide/Hydrochlorothiazide /Spironolactone/Chlorthalidone, please HOLD on the morning of the test. Patients who wear a continuous glucose monitor MUST remove the device prior to scanning. FEMALES- please wear underwire-free bra if available, avoid dresses & tight clothing      After the Test: Drink plenty of water . After receiving IV contrast, you may experience a mild flushed feeling. This is normal. On occasion, you may experience a mild rash up to 24 hours after the test. This is not dangerous. If this occurs, you can take Benadryl  25 mg, Zyrtec, Claritin, or Allegra and increase your fluid intake. (Patients taking Tikosyn should avoid Benadryl , and may take Zyrtec, Claritin, or Allegra) If you experience trouble breathing, this can be serious. If it is severe call 911 IMMEDIATELY. If it is mild, please call our office.  We will call to schedule your test 2-4 weeks out understanding that some insurance companies will need an authorization prior to the service being performed.   For more information and frequently asked questions, please visit our website : [email or web address]  For non-scheduling related questions, please contact the cardiac imaging nurse navigator should you have any questions/concerns: Cardiac Imaging Nurse Navigators Direct Office Dial: [phone]   For scheduling needs, including cancellations and rescheduling, please call Brittany, [phone].

## 2024-10-07 ENCOUNTER — Ambulatory Visit: Payer: Self-pay | Admitting: Student

## 2024-10-07 LAB — BASIC METABOLIC PANEL WITH GFR
BUN/Creatinine Ratio: 10 — ABNORMAL LOW (ref 12–28)
BUN: 8 mg/dL (ref 8–27)
CO2: 25 mmol/L (ref 20–29)
Calcium: 9.4 mg/dL (ref 8.7–10.3)
Chloride: 97 mmol/L (ref 96–106)
Creatinine, Ser: 0.83 mg/dL (ref 0.57–1.00)
Glucose: 82 mg/dL (ref 70–99)
Potassium: 3.9 mmol/L (ref 3.5–5.2)
Sodium: 137 mmol/L (ref 134–144)
eGFR: 76 mL/min/1.73 (ref >59)

## 2024-10-08 ENCOUNTER — Other Ambulatory Visit: Payer: Self-pay | Admitting: Internal Medicine

## 2024-10-08 DIAGNOSIS — K449 Diaphragmatic hernia without obstruction or gangrene: Secondary | ICD-10-CM

## 2024-10-13 NOTE — Telephone Encounter (Signed)
 Requested Prescriptions  Pending Prescriptions Disp Refills   pantoprazole  (PROTONIX ) 40 MG tablet [Pharmacy Med Name: Pantoprazole  Sodium 40 MG Oral Tablet Delayed Release] 90 tablet 0    Sig: TAKE 1 TABLET BY MOUTH DAILY     Gastroenterology: Proton Pump Inhibitors Passed - 10/13/2024 11:31 AM      Passed - Valid encounter within last 12 months    Recent Outpatient Visits           5 months ago Essential (primary) hypertension   [facility] at [facility], Leita DEL, MD

## 2024-10-28 ENCOUNTER — Telehealth (HOSPITAL_COMMUNITY): Payer: Self-pay | Admitting: *Deleted

## 2024-10-28 NOTE — Telephone Encounter (Signed)
 Reaching out to patient to offer assistance regarding upcoming cardiac imaging study; pt verbalizes understanding of appt date/time, parking situation and where to check in, pre-test NPO status, and verified current allergies; name and call back number provided for further questions should they arise  Larey Brick RN Navigator Cardiac Imaging [facility] [phone] office [phone] cell

## 2024-10-28 NOTE — Telephone Encounter (Signed)
 Attempted to call patient regarding upcoming cardiac CT appointment. Left message on voicemail with name and callback number  Larey Brick RN Navigator Cardiac Imaging [facility] [phone] Office [phone] Cell

## 2024-10-29 ENCOUNTER — Ambulatory Visit

## 2024-10-29 DIAGNOSIS — R072 Precordial pain: Secondary | ICD-10-CM

## 2024-10-29 MED ORDER — NITROGLYCERIN 0.4 MG SL SUBL
0.8000 mg | SUBLINGUAL_TABLET | Freq: Once | SUBLINGUAL | Status: AC
  Administered 2024-10-29: 15:00:00 0.8 mg via SUBLINGUAL
  Filled 2024-10-29: qty 25

## 2024-10-29 MED ORDER — METOPROLOL TARTRATE 5 MG/5ML IV SOLN
10.0000 mg | INTRAVENOUS | Status: DC | PRN
  Filled 2024-10-29: qty 10

## 2024-10-29 MED ORDER — IOHEXOL 350 MG/ML SOLN
100.0000 mL | Freq: Once | INTRAVENOUS | Status: AC | PRN
  Administered 2024-10-29: 15:00:00 100 mL via INTRAVENOUS

## 2024-10-29 MED ORDER — DILTIAZEM HCL 25 MG/5ML IV SOLN
10.0000 mg | INTRAVENOUS | Status: DC | PRN
  Filled 2024-10-29: qty 5

## 2024-10-29 NOTE — Progress Notes (Signed)
 Patient tolerated procedure well. Ambulate w/o difficulty. Denies any lightheadedness or being dizzy. Pt denies any pain at this time. Sitting in chair. Pt is encouraged to drink additional water throughout the day and reason explained to patient. Patient verbalized understanding and all questions answered. ABC intact. No further needs at this time. Discharge from procedure area w/o issues.

## 2024-11-02 NOTE — Progress Notes (Signed)
 Last read by Inocente SHAUNNA Sharps at 5:06PM on 10/30/2024.

## 2024-11-18 ENCOUNTER — Ambulatory Visit
Admission: RE | Admit: 2024-11-18 | Discharge: 2024-11-18 | Disposition: A | Discharge status: Home / Self Care | Source: Ambulatory Visit | Attending: Family Medicine | Admitting: Family Medicine

## 2024-11-18 ENCOUNTER — Encounter: Payer: Self-pay | Admitting: Family Medicine

## 2024-11-18 ENCOUNTER — Ambulatory Visit (INDEPENDENT_AMBULATORY_CARE_PROVIDER_SITE_OTHER): Admitting: Family Medicine

## 2024-11-18 ENCOUNTER — Ambulatory Visit
Admission: RE | Admit: 2024-11-18 | Discharge: 2024-11-18 | Disposition: A | Discharge status: Home / Self Care | Attending: Family Medicine | Admitting: Family Medicine

## 2024-11-18 VITALS — BP 116/74 | HR 57 | Ht 66.0 in | Wt 179.0 lb

## 2024-11-18 DIAGNOSIS — K219 Gastro-esophageal reflux disease without esophagitis: Secondary | ICD-10-CM | POA: Diagnosis not present

## 2024-11-18 DIAGNOSIS — Z9049 Acquired absence of other specified parts of digestive tract: Secondary | ICD-10-CM | POA: Diagnosis not present

## 2024-11-18 DIAGNOSIS — M25561 Pain in right knee: Secondary | ICD-10-CM | POA: Diagnosis not present

## 2024-11-18 DIAGNOSIS — F411 Generalized anxiety disorder: Secondary | ICD-10-CM

## 2024-11-18 DIAGNOSIS — K581 Irritable bowel syndrome with constipation: Secondary | ICD-10-CM | POA: Diagnosis not present

## 2024-11-18 DIAGNOSIS — F5101 Primary insomnia: Secondary | ICD-10-CM

## 2024-11-18 DIAGNOSIS — I10 Essential (primary) hypertension: Secondary | ICD-10-CM

## 2024-11-18 DIAGNOSIS — E782 Mixed hyperlipidemia: Secondary | ICD-10-CM | POA: Diagnosis not present

## 2024-11-18 MED ORDER — FAMOTIDINE 20 MG PO TABS: 20.0000 mg | ORAL_TABLET | Freq: Two times a day (BID) | ORAL | 1 refills | Status: AC

## 2024-11-18 MED ORDER — ZOLPIDEM TARTRATE 10 MG PO TABS: 10.0000 mg | ORAL_TABLET | Freq: Every day | ORAL | 0 refills | Status: AC

## 2024-11-18 MED ORDER — ALPRAZOLAM 0.25 MG PO TABS: 0.2500 mg | ORAL_TABLET | Freq: Every evening | ORAL | 1 refills | Status: AC | PRN
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
## 2024-11-24 ENCOUNTER — Ambulatory Visit

## 2024-11-24 ENCOUNTER — Telehealth: Payer: Self-pay

## 2024-11-24 ENCOUNTER — Other Ambulatory Visit: Payer: Self-pay | Admitting: Family Medicine

## 2024-11-24 DIAGNOSIS — Z13820 Encounter for screening for osteoporosis: Secondary | ICD-10-CM

## 2024-11-24 DIAGNOSIS — R0602 Shortness of breath: Secondary | ICD-10-CM

## 2024-11-24 DIAGNOSIS — Z1231 Encounter for screening mammogram for malignant neoplasm of breast: Secondary | ICD-10-CM

## 2024-11-24 LAB — ECHOCARDIOGRAM COMPLETE
AR max vel: 1.74 cm2
AV Area VTI: 1.7 cm2
AV Area mean vel: 1.54 cm2
AV Mean grad: 5 mmHg
AV Peak grad: 8.9 mmHg
Ao pk vel: 1.49 m/s
Area-P 1/2: 2.99 cm2
S' Lateral: 2.7 cm

## 2024-11-24 NOTE — Telephone Encounter (Signed)
 Patient has scheduled appointment for her imaging.

## 2024-11-24 NOTE — Telephone Encounter (Signed)
 Orders placed.

## 2024-11-24 NOTE — Telephone Encounter (Signed)
 Copied from CRM [id number]. Topic: Clinical - Request for Lab/Test Order >> Nov 24, 2024 10:53 AM Lonell PEDLAR wrote: Reason for CRM: Patient is due for a mammogram end of February and would like to have it done at [facility]. Please review and send req to center.  Patient is also stating that she is due for a bone density scan as well and would like this to also be ordered. Please review and contact patient to advise. [phone]

## 2024-11-27 ENCOUNTER — Ambulatory Visit: Payer: Self-pay | Admitting: Family Medicine

## 2024-12-01 NOTE — Progress Notes (Signed)
 Last read by Inocente SHAUNNA Sharps at 4:29PM on 11/30/2024.

## 2025-01-11 ENCOUNTER — Other Ambulatory Visit

## 2025-01-11 ENCOUNTER — Encounter

## 2025-02-17 ENCOUNTER — Encounter: Admitting: Family Medicine

## 2025-08-11 ENCOUNTER — Ambulatory Visit
